# Patient Record
Sex: Male | Born: 1960 | Race: Black or African American | Hispanic: No | Marital: Single | Smoking: Former smoker
Health system: Southern US, Community
[De-identification: ages and names within clinical notes are randomized; demographics above are authoritative.]

## PROBLEM LIST (undated history)

## (undated) ENCOUNTER — Emergency Department (HOSPITAL_BASED_OUTPATIENT_CLINIC_OR_DEPARTMENT_OTHER): Admission: EM | Payer: Self-pay | Source: Home / Self Care

## (undated) DIAGNOSIS — F1910 Other psychoactive substance abuse, uncomplicated: Secondary | ICD-10-CM

## (undated) DIAGNOSIS — F329 Major depressive disorder, single episode, unspecified: Secondary | ICD-10-CM

## (undated) DIAGNOSIS — K625 Hemorrhage of anus and rectum: Secondary | ICD-10-CM

## (undated) DIAGNOSIS — K852 Alcohol induced acute pancreatitis without necrosis or infection: Secondary | ICD-10-CM

## (undated) DIAGNOSIS — E0590 Thyrotoxicosis, unspecified without thyrotoxic crisis or storm: Secondary | ICD-10-CM

## (undated) DIAGNOSIS — M109 Gout, unspecified: Secondary | ICD-10-CM

## (undated) DIAGNOSIS — M1990 Unspecified osteoarthritis, unspecified site: Secondary | ICD-10-CM

## (undated) DIAGNOSIS — R45851 Suicidal ideations: Secondary | ICD-10-CM

## (undated) DIAGNOSIS — F32A Depression, unspecified: Secondary | ICD-10-CM

## (undated) DIAGNOSIS — D500 Iron deficiency anemia secondary to blood loss (chronic): Secondary | ICD-10-CM

## (undated) HISTORY — DX: Other psychoactive substance abuse, uncomplicated: F19.10

## (undated) HISTORY — PX: OTHER SURGICAL HISTORY: SHX169

## (undated) HISTORY — PX: ORTHOPEDIC SURGERY: SHX850

## (undated) HISTORY — DX: Gout, unspecified: M10.9

---

## 2005-01-08 ENCOUNTER — Inpatient Hospital Stay (HOSPITAL_COMMUNITY): Admission: AC | Admit: 2005-01-08 | Discharge: 2005-01-10 | Payer: Self-pay

## 2008-04-17 ENCOUNTER — Ambulatory Visit: Payer: Self-pay | Admitting: Cardiology

## 2009-01-22 ENCOUNTER — Emergency Department (HOSPITAL_COMMUNITY): Admission: EM | Admit: 2009-01-22 | Discharge: 2009-01-22 | Payer: Self-pay | Admitting: Emergency Medicine

## 2009-10-06 ENCOUNTER — Ambulatory Visit: Payer: Self-pay | Admitting: Psychiatry

## 2009-10-06 ENCOUNTER — Emergency Department (HOSPITAL_COMMUNITY): Admission: EM | Admit: 2009-10-06 | Discharge: 2009-10-06 | Payer: Self-pay | Admitting: Emergency Medicine

## 2009-10-06 ENCOUNTER — Inpatient Hospital Stay (HOSPITAL_COMMUNITY): Admission: RE | Admit: 2009-10-06 | Discharge: 2009-10-12 | Payer: Self-pay | Admitting: Psychiatry

## 2009-10-07 ENCOUNTER — Ambulatory Visit: Payer: Self-pay | Admitting: Infectious Disease

## 2009-10-21 ENCOUNTER — Emergency Department (HOSPITAL_COMMUNITY): Admission: EM | Admit: 2009-10-21 | Discharge: 2009-10-21 | Payer: Self-pay | Admitting: Emergency Medicine

## 2009-11-07 ENCOUNTER — Ambulatory Visit (HOSPITAL_COMMUNITY): Admission: RE | Admit: 2009-11-07 | Discharge: 2009-11-07 | Payer: Self-pay | Admitting: Urology

## 2009-11-22 ENCOUNTER — Emergency Department (HOSPITAL_COMMUNITY): Admission: EM | Admit: 2009-11-22 | Discharge: 2009-11-22 | Payer: Self-pay | Admitting: Emergency Medicine

## 2010-05-10 ENCOUNTER — Ambulatory Visit: Payer: Self-pay | Admitting: Psychiatry

## 2010-05-10 ENCOUNTER — Inpatient Hospital Stay (HOSPITAL_COMMUNITY): Admission: AD | Admit: 2010-05-10 | Discharge: 2010-05-16 | Payer: Self-pay | Admitting: Psychiatry

## 2010-05-10 ENCOUNTER — Emergency Department (HOSPITAL_COMMUNITY): Admission: EM | Admit: 2010-05-10 | Discharge: 2010-05-10 | Payer: Self-pay | Admitting: Emergency Medicine

## 2010-07-08 ENCOUNTER — Emergency Department (HOSPITAL_COMMUNITY): Admission: EM | Admit: 2010-07-08 | Discharge: 2010-07-08 | Payer: Self-pay | Admitting: Emergency Medicine

## 2010-07-09 ENCOUNTER — Ambulatory Visit: Payer: Self-pay | Admitting: Psychiatry

## 2010-08-07 ENCOUNTER — Emergency Department (HOSPITAL_COMMUNITY): Admission: EM | Admit: 2010-08-07 | Discharge: 2010-08-07 | Payer: Self-pay | Admitting: Emergency Medicine

## 2011-03-03 LAB — STREP A DNA PROBE: Group A Strep Probe: NEGATIVE

## 2011-03-03 LAB — DIFFERENTIAL
Basophils Absolute: 0.1 10*3/uL (ref 0.0–0.1)
Basophils Relative: 1 % (ref 0–1)
Eosinophils Absolute: 0.1 10*3/uL (ref 0.0–0.7)
Monocytes Absolute: 0.4 10*3/uL (ref 0.1–1.0)
Neutro Abs: 5.2 10*3/uL (ref 1.7–7.7)
Neutrophils Relative %: 59 % (ref 43–77)

## 2011-03-03 LAB — HEPATIC FUNCTION PANEL
AST: 16 U/L (ref 0–37)
Albumin: 3.9 g/dL (ref 3.5–5.2)
Total Protein: 6.8 g/dL (ref 6.0–8.3)

## 2011-03-03 LAB — POCT I-STAT, CHEM 8
BUN: 6 mg/dL (ref 6–23)
Chloride: 111 mEq/L (ref 96–112)
Creatinine, Ser: 1 mg/dL (ref 0.4–1.5)
Glucose, Bld: 92 mg/dL (ref 70–99)
HCT: 38 % — ABNORMAL LOW (ref 39.0–52.0)
Potassium: 3.5 mEq/L (ref 3.5–5.1)

## 2011-03-03 LAB — URINALYSIS, ROUTINE W REFLEX MICROSCOPIC
Ketones, ur: NEGATIVE mg/dL
Nitrite: NEGATIVE
Protein, ur: NEGATIVE mg/dL

## 2011-03-03 LAB — RAPID URINE DRUG SCREEN, HOSP PERFORMED
Amphetamines: NOT DETECTED
Cocaine: POSITIVE — AB
Opiates: NOT DETECTED
Tetrahydrocannabinol: NOT DETECTED

## 2011-03-03 LAB — CBC
Hemoglobin: 13.1 g/dL (ref 13.0–17.0)
MCHC: 33.8 g/dL (ref 30.0–36.0)
MCV: 86.1 fL (ref 78.0–100.0)
RDW: 14.2 % (ref 11.5–15.5)

## 2011-03-19 LAB — COMPREHENSIVE METABOLIC PANEL
AST: 20 U/L (ref 0–37)
Albumin: 3.6 g/dL (ref 3.5–5.2)
Chloride: 109 mEq/L (ref 96–112)
Creatinine, Ser: 1.35 mg/dL (ref 0.4–1.5)
GFR calc Af Amer: >60 mL/min (ref >60)
Sodium: 142 mEq/L (ref 135–145)
Total Bilirubin: 0.4 mg/dL (ref 0.3–1.2)

## 2011-03-19 LAB — DIFFERENTIAL
Eosinophils Relative: 1 % (ref 0–5)
Lymphocytes Relative: 15 % (ref 12–46)
Lymphs Abs: 2.3 10*3/uL (ref 0.7–4.0)
Monocytes Absolute: 0.5 10*3/uL (ref 0.1–1.0)

## 2011-03-19 LAB — URINALYSIS, ROUTINE W REFLEX MICROSCOPIC
Bilirubin Urine: NEGATIVE
Specific Gravity, Urine: 1.025 (ref 1.005–1.030)
pH: 6 (ref 5.0–8.0)

## 2011-03-19 LAB — URINE MICROSCOPIC-ADD ON

## 2011-03-19 LAB — CBC
MCV: 87 fL (ref 78.0–100.0)
Platelets: 158 10*3/uL (ref 150–400)
WBC: 15.2 10*3/uL — ABNORMAL HIGH (ref 4.0–10.5)

## 2011-03-19 LAB — LACTIC ACID, PLASMA: Lactic Acid, Venous: 1.6 mmol/L (ref 0.5–2.2)

## 2011-03-19 LAB — RAPID URINE DRUG SCREEN, HOSP PERFORMED
Amphetamines: NOT DETECTED
Benzodiazepines: NOT DETECTED
Cocaine: NOT DETECTED
Tetrahydrocannabinol: NOT DETECTED

## 2011-03-20 LAB — COMPREHENSIVE METABOLIC PANEL
ALT: 13 U/L (ref 0–53)
AST: 26 U/L (ref 0–37)
CO2: 22 mEq/L (ref 19–32)
Calcium: 8.7 mg/dL (ref 8.4–10.5)
Chloride: 107 mEq/L (ref 96–112)
GFR calc Af Amer: >60 mL/min (ref >60)
GFR calc non Af Amer: >60 mL/min (ref >60)
Sodium: 140 mEq/L (ref 135–145)
Total Bilirubin: 1.1 mg/dL (ref 0.3–1.2)

## 2011-03-20 LAB — DIFFERENTIAL
Eosinophils Absolute: 0 10*3/uL (ref 0.0–0.7)
Eosinophils Relative: 0 % (ref 0–5)
Lymphs Abs: 1.3 10*3/uL (ref 0.7–4.0)
Monocytes Absolute: 0.6 10*3/uL (ref 0.1–1.0)

## 2011-03-20 LAB — HIV ANTIBODY (ROUTINE TESTING W REFLEX): HIV: NONREACTIVE

## 2011-03-20 LAB — RAPID URINE DRUG SCREEN, HOSP PERFORMED
Benzodiazepines: NOT DETECTED
Cocaine: POSITIVE — AB
Opiates: NOT DETECTED

## 2011-03-20 LAB — CBC
RBC: 4.19 MIL/uL — ABNORMAL LOW (ref 4.22–5.81)
WBC: 12.6 10*3/uL — ABNORMAL HIGH (ref 4.0–10.5)

## 2011-03-20 LAB — POCT CARDIAC MARKERS: Troponin i, poc: <0.05 ng/mL (ref 0.00–0.09)

## 2011-03-20 LAB — D-DIMER, QUANTITATIVE: D-Dimer, Quant: 0.7 ug/mL-FEU — ABNORMAL HIGH (ref 0.00–0.48)

## 2011-04-14 ENCOUNTER — Emergency Department (HOSPITAL_COMMUNITY)
Admission: EM | Admit: 2011-04-14 | Discharge: 2011-04-14 | Discharge status: Against Medical Advice | Payer: Medicare Other | Attending: Emergency Medicine | Admitting: Emergency Medicine

## 2011-04-14 ENCOUNTER — Emergency Department (HOSPITAL_COMMUNITY): Payer: Medicare Other

## 2011-04-14 DIAGNOSIS — X500XXA Overexertion from strenuous movement or load, initial encounter: Secondary | ICD-10-CM | POA: Insufficient documentation

## 2011-04-14 DIAGNOSIS — Y998 Other external cause status: Secondary | ICD-10-CM | POA: Insufficient documentation

## 2011-04-14 DIAGNOSIS — S93409A Sprain of unspecified ligament of unspecified ankle, initial encounter: Secondary | ICD-10-CM | POA: Insufficient documentation

## 2011-04-14 DIAGNOSIS — M129 Arthropathy, unspecified: Secondary | ICD-10-CM | POA: Insufficient documentation

## 2011-04-14 DIAGNOSIS — Y9367 Activity, basketball: Secondary | ICD-10-CM | POA: Insufficient documentation

## 2011-07-13 ENCOUNTER — Emergency Department (HOSPITAL_COMMUNITY)
Admission: EM | Admit: 2011-07-13 | Discharge: 2011-07-13 | Disposition: A | Discharge status: Home / Self Care | Payer: Medicare Other | Attending: Emergency Medicine | Admitting: Emergency Medicine

## 2011-07-13 ENCOUNTER — Encounter: Payer: Self-pay | Admitting: *Deleted

## 2011-07-13 DIAGNOSIS — R252 Cramp and spasm: Secondary | ICD-10-CM | POA: Insufficient documentation

## 2011-07-13 DIAGNOSIS — IMO0001 Reserved for inherently not codable concepts without codable children: Secondary | ICD-10-CM | POA: Insufficient documentation

## 2011-07-13 HISTORY — DX: Unspecified osteoarthritis, unspecified site: M19.90

## 2011-07-13 MED ORDER — CYCLOBENZAPRINE HCL 10 MG PO TABS: ORAL_TABLET | ORAL | Status: DC

## 2011-07-13 MED ORDER — OXYCODONE-ACETAMINOPHEN 5-325 MG PO TABS: 1.0000 | ORAL_TABLET | Freq: Four times a day (QID) | ORAL | Status: AC | PRN

## 2011-07-13 MED ORDER — OXYCODONE-ACETAMINOPHEN 5-325 MG PO TABS
1.0000 | ORAL_TABLET | Freq: Once | ORAL | Status: AC
  Administered 2011-07-13: 1 via ORAL
  Filled 2011-07-13: qty 1

## 2011-07-13 MED ORDER — CYCLOBENZAPRINE HCL 10 MG PO TABS
10.0000 mg | ORAL_TABLET | Freq: Once | ORAL | Status: AC
  Administered 2011-07-13: 10 mg via ORAL
  Filled 2011-07-13: qty 1

## 2011-07-13 NOTE — ED Provider Notes (Signed)
History     Chief Complaint  Patient presents with  . Muscle Pain   The history is provided by the patient (pt has intermittent muscle spasms in his arms.  his MD in [place] treats him with flexeril and percocet.). No language interpreter was used.    Past Medical History  Diagnosis Date  . Arthritis     Past Surgical History  Procedure Date  . Orthopedic surgery     No family history on file.  History  Substance Use Topics  . Smoking status: Never Smoker   . Smokeless tobacco: Not on file  . Alcohol Use: 0.6 oz/week    1 Cans of beer per week      Review of Systems  Musculoskeletal: Positive for myalgias.       Muscle spasms    Physical Exam  BP 129/79  Pulse 76  Resp 20  Ht 6\' 3"  (1.905 m)  Wt 165 lb (74.844 kg)  BMI 20.62 kg/m2  SpO2 100%  Physical Exam  Nursing note and vitals reviewed. Constitutional: He is oriented to person, place, and time. Vital signs are normal. He appears well-developed and well-nourished.  HENT:  Head: Normocephalic and atraumatic.  Right Ear: External ear normal.  Left Ear: External ear normal.  Nose: Nose normal.  Mouth/Throat: No oropharyngeal exudate.  Eyes: Conjunctivae and EOM are normal. Pupils are equal, round, and reactive to light. Right eye exhibits no discharge. Left eye exhibits no discharge. No scleral icterus.  Neck: Normal range of motion. Neck supple. No JVD present. No tracheal deviation present. No thyromegaly present.  Cardiovascular: Normal rate, regular rhythm, normal heart sounds, intact distal pulses and normal pulses.  Exam reveals no gallop and no friction rub.   No murmur heard. Pulmonary/Chest: Effort normal and breath sounds normal. No stridor. No respiratory distress. He has no wheezes. He has no rales. He exhibits no tenderness.  Abdominal: Soft. Normal appearance and bowel sounds are normal. He exhibits no distension and no mass. There is no tenderness. There is no rebound and no guarding.    Musculoskeletal: He exhibits no edema and no tenderness.       Right shoulder: Tenderness: `````````````````B upper extremities having muscle spasms.  Lymphadenopathy:    He has no cervical adenopathy.  Neurological: He is alert and oriented to person, place, and time. He has normal reflexes. No cranial nerve deficit. Coordination normal. GCS eye subscore is 4. GCS verbal subscore is 5. GCS motor subscore is 6.  Reflex Scores:      Tricep reflexes are 2+ on the right side and 2+ on the left side.      Bicep reflexes are 2+ on the right side and 2+ on the left side.      Brachioradialis reflexes are 2+ on the right side and 2+ on the left side.      Patellar reflexes are 2+ on the right side and 2+ on the left side.      Achilles reflexes are 2+ on the right side and 2+ on the left side. Skin: Skin is warm and dry. No rash noted. He is not diaphoretic.  Psychiatric: He has a normal mood and affect. His speech is normal and behavior is normal. Judgment and thought content normal. Cognition and memory are normal.    ED Course  Procedures  MDM Pt has just moved here to take care of his mother and has no PCP.      Worthy Rancher, PA 07/13/11 [phone]  Worthy Rancher, PA 07/13/11 2320  Worthy Rancher, PA 07/13/11 2321  Worthy Rancher, PA 07/13/11 2323 Medical screening examination/treatment/procedure(s) were performed by non-physician practitioner and as supervising physician I was immediately available for consultation/collaboration.  Nicoletta Dress. Colon Branch, MD 07/17/11 2337

## 2011-07-13 NOTE — ED Notes (Signed)
PT GIVEN WARM BLANKETS TO PLACE ON LEGS. PER PT, PT HAS HISTORY OF ARTHRITIS. STATES HE IS HAVING A FLARE UP THAT STARTED THIS MORNING. STATES HE GOT UP THIS MORNING, MOVING AROUND HELPED. PAIN STARTS IN BILATERAL HIPS AND RADIATES DOWN TO FEET. MOST SEVER PAIN IN BILATERAL KNEES. STATES IT IS SHARP AND ACHING IN NATURE. MILD SWELLING TO KNEES. MOVING AROUND A LOT MAKES PAIN WORSE. STATES HE USUALLY TAKES FLEXERIL FOR PAIN AND IT TENDS TO HELP BUT IS OUT OF MEDICATION. DENIES ANY NEEDS AT THIS TIME. CALL BELL AT BS. PENDING MD EVAL.

## 2011-07-13 NOTE — ED Notes (Signed)
Has a history of arthritis, out of pain meds. Advised of extended wait

## 2011-08-09 ENCOUNTER — Emergency Department (HOSPITAL_COMMUNITY)
Admission: EM | Admit: 2011-08-09 | Discharge: 2011-08-09 | Disposition: A | Discharge status: Home / Self Care | Payer: Medicare Other | Attending: Emergency Medicine | Admitting: Emergency Medicine

## 2011-08-09 ENCOUNTER — Encounter (HOSPITAL_COMMUNITY): Payer: Self-pay | Admitting: *Deleted

## 2011-08-09 DIAGNOSIS — M62838 Other muscle spasm: Secondary | ICD-10-CM

## 2011-08-09 MED ORDER — DIAZEPAM 5 MG PO TABS: 5.0000 mg | ORAL_TABLET | Freq: Two times a day (BID) | ORAL | Status: AC | PRN

## 2011-08-09 MED ORDER — DIAZEPAM 5 MG/ML IJ SOLN: 5.0000 mg | Freq: Once | INTRAMUSCULAR | Status: DC

## 2011-08-09 MED ORDER — HYDROMORPHONE HCL 1 MG/ML IJ SOLN
2.0000 mg | Freq: Once | INTRAMUSCULAR | Status: AC
  Administered 2011-08-09: 2 mg via INTRAMUSCULAR
  Filled 2011-08-09: qty 2

## 2011-08-09 MED ORDER — DIAZEPAM 5 MG PO TABS
5.0000 mg | ORAL_TABLET | Freq: Once | ORAL | Status: AC
  Administered 2011-08-09: 5 mg via ORAL
  Filled 2011-08-09: qty 1

## 2011-08-09 NOTE — ED Notes (Signed)
Pt says his pain is moving from his left leg to his right

## 2011-08-09 NOTE — ED Provider Notes (Signed)
History     CSN: [id number] Arrival date & time: 08/09/2011  4:53 AM  Chief Complaint  Patient presents with  . 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  He reports at times he will have severe cramps in his legs, however this episode is worse than normal.  He admits that his symptoms started over 3 weeks ago but has just worsened.  No new injury. No other new complaints  Patient is a 50 y.o. male presenting with leg pain. The history is provided by the patient.  Leg Pain  The incident occurred more than 1 week ago. There was no injury mechanism. The pain is present in the left leg. Quality: spasm. The pain is severe. The pain has been constant since onset. Pertinent negatives include no numbness, no muscle weakness, no loss of sensation and no tingling. The symptoms are aggravated by palpation and activity.    Past Medical History  Diagnosis Date  . Arthritis     Past Surgical History  Procedure Date  . Orthopedic surgery     No family history on file.  History  Substance Use Topics  . Smoking status: Never Smoker   . Smokeless tobacco: Not on file  . Alcohol Use: 0.6 oz/week    1 Cans of beer per week      Review of Systems  Neurological: Negative for tingling and numbness.  All other systems reviewed and are negative.    Physical Exam  BP 127/75  Pulse 84  Temp(Src) 97.9 F (36.6 C) (Oral)  Resp 24  Ht 6\' 2"  (1.88 m)  Wt 162 lb (73.483 kg)  BMI 20.80 kg/m2  SpO2 100%  Physical Exam  CONSTITUTIONAL: Well developed/well nourished, anxious appearing HEAD AND FACE: Normocephalic/atraumatic EYES: EOMI/PERRL ENMT: Mucous membranes moist NECK: supple no meningeal signs SPINE:entire spine nontender CV: S1/S2 noted, no murmurs/rubs/gallops noted LUNGS: Lungs are clear to auscultation bilaterally, no apparent distress ABDOMEN: soft, nontender, no rebound or guarding NEURO: Pt is awake/alert, moves  all extremitiesx4 EXTREMITIES: pulses normal, full ROM, has tendernes to palpation of left thigh and left calf, no erythema/edema noted.  Healed scars noted to his lower extremities SKIN: warm, color normal PSYCH: anxious   ED Course  Procedures  MDM Nursing notes reviewed and considered in documentation Previous records reviewed and considered  Pt with severe muscle spasms, will treat and reassess   6:42 AM pt much improved, sleeping at this time.   Pt now reports spasm in his other leg, but overall feels improved, reports similar to prior.  Reports long h/o spasms.   Requests referral to primary provider  7:50 AM    Joya Gaskins, MD 08/09/11 [phone]

## 2011-08-09 NOTE — ED Notes (Signed)
Resting in no distress; awakened to assess pain; Pt c/o of LLE pain throughout the night; states he rec'd a shot for his pain, but that his pain is not relieved and has not moved to RLE; states pain comes from ankles bilaterally and radiates up to knees; describes as cramping.

## 2011-08-09 NOTE — ED Notes (Signed)
Pt reports severe leg cramps worsening this am

## 2011-08-09 NOTE — ED Notes (Signed)
Assumed c/o pt

## 2011-08-09 NOTE — ED Notes (Signed)
Pt up OOB, reports pain improved-ambulatory with steady gait; placed in wheelchair and escorted to waiting room by pt advocate. Left in c/o spouse for transport home.

## 2011-08-25 ENCOUNTER — Encounter (HOSPITAL_COMMUNITY): Payer: Self-pay | Admitting: *Deleted

## 2011-08-25 ENCOUNTER — Emergency Department (HOSPITAL_COMMUNITY)
Admission: EM | Admit: 2011-08-25 | Discharge: 2011-08-25 | Disposition: A | Discharge status: Home / Self Care | Payer: Medicare Other | Attending: Emergency Medicine | Admitting: Emergency Medicine

## 2011-08-25 ENCOUNTER — Emergency Department (HOSPITAL_COMMUNITY): Payer: Medicare Other

## 2011-08-25 DIAGNOSIS — M1120 Other chondrocalcinosis, unspecified site: Secondary | ICD-10-CM

## 2011-08-25 DIAGNOSIS — M25469 Effusion, unspecified knee: Secondary | ICD-10-CM | POA: Insufficient documentation

## 2011-08-25 DIAGNOSIS — M25569 Pain in unspecified knee: Secondary | ICD-10-CM | POA: Insufficient documentation

## 2011-08-25 DIAGNOSIS — M25562 Pain in left knee: Secondary | ICD-10-CM

## 2011-08-25 LAB — DIFFERENTIAL
Basophils Relative: 0 % (ref 0–1)
Lymphocytes Relative: 12 % (ref 12–46)
Lymphs Abs: 1.6 10*3/uL (ref 0.7–4.0)
Monocytes Relative: 5 % (ref 3–12)
Neutro Abs: 11.1 10*3/uL — ABNORMAL HIGH (ref 1.7–7.7)
Neutrophils Relative %: 83 % — ABNORMAL HIGH (ref 43–77)

## 2011-08-25 LAB — CBC
Hemoglobin: 13.1 g/dL (ref 13.0–17.0)
RBC: 4.68 MIL/uL (ref 4.22–5.81)
WBC: 13.4 10*3/uL — ABNORMAL HIGH (ref 4.0–10.5)

## 2011-08-25 LAB — URIC ACID: Uric Acid, Serum: 1.8 mg/dL — ABNORMAL LOW (ref 4.0–7.8)

## 2011-08-25 MED ORDER — OXYCODONE-ACETAMINOPHEN 5-325 MG PO TABS
1.0000 | ORAL_TABLET | Freq: Once | ORAL | Status: AC
  Administered 2011-08-25: 1 via ORAL
  Filled 2011-08-25: qty 1

## 2011-08-25 MED ORDER — OXYCODONE-ACETAMINOPHEN 5-325 MG PO TABS: 1.0000 | ORAL_TABLET | ORAL | Status: AC | PRN

## 2011-08-25 MED ORDER — INDOMETHACIN 25 MG PO CAPS: 50.0000 mg | ORAL_CAPSULE | Freq: Three times a day (TID) | ORAL | Status: DC | PRN

## 2011-08-25 NOTE — ED Notes (Signed)
C/o arthritis pain and swelling to both knees that started last night.  States took flexaril yesterday evening without relief.  Denies injury.

## 2011-08-25 NOTE — ED Notes (Signed)
PA in with pt at this time  

## 2011-08-25 NOTE — ED Provider Notes (Signed)
History     CSN: [id number] Arrival date & time: 08/25/2011 10:55 AM  Chief Complaint  Patient presents with  . Knee Pain   HPI Comments: Patient c/o waxing and waning pain to both knees for several weeks.  States that he was seen here recently for same sx's and prescribed muscle relaxers which he states have not helped control the pain.  He also stats the pain was more severe in the left knee last knee and this week he is now having similar pain in the right knee.  He denies fever, redness, rash or numbness.    Patient is a 50 y.o. male presenting with knee pain. The history is provided by the patient.  Knee Pain This is a recurrent problem. The current episode started yesterday. The problem occurs constantly. The problem has been unchanged. Associated symptoms include arthralgias and joint swelling. Pertinent negatives include no abdominal pain, chest pain, chills, fatigue, fever, headaches, myalgias, nausea, neck pain, numbness, rash, swollen glands, vomiting or weakness. The symptoms are aggravated by standing and walking. Treatments tried: OTC medication and muscle relaxers. The treatment provided no relief.  Knee Pain This is a recurrent problem. The current episode started yesterday. The problem occurs constantly. The problem has been unchanged. Pertinent negatives include no chest pain, no abdominal pain and no headaches. The symptoms are aggravated by standing and walking. Treatments tried: OTC medication and muscle relaxers. The treatment provided no relief.    Past Medical History  Diagnosis Date  . Arthritis     Past Surgical History  Procedure Date  . Orthopedic surgery     No family history on file.  History  Substance Use Topics  . Smoking status: Never Smoker   . Smokeless tobacco: Not on file  . Alcohol Use: 0.6 oz/week    1 Cans of beer per week      Review of Systems  Constitutional: Negative for fever, chills and fatigue.  HENT: Negative for neck pain.     Cardiovascular: Negative for chest pain.  Gastrointestinal: Negative for nausea, vomiting and abdominal pain.  Genitourinary: Negative for hematuria, flank pain and difficulty urinating.  Musculoskeletal: Positive for joint swelling and arthralgias. Negative for myalgias.  Skin: Negative for rash.  Neurological: Negative for weakness, numbness and headaches.  Hematological: Negative for adenopathy. Does not bruise/bleed easily.  All other systems reviewed and are negative.    Physical Exam  BP 124/80  Pulse 97  Temp(Src) 97.8 F (36.6 C) (Oral)  Resp 18  Ht 6\' 2"  (1.88 m)  Wt 162 lb (73.483 kg)  BMI 20.80 kg/m2  SpO2 100%  Physical Exam  Nursing note and vitals reviewed. Constitutional: He is oriented to person, place, and time. He appears well-developed and well-nourished. No distress.  HENT:  Head: Normocephalic and atraumatic.  Mouth/Throat: Oropharynx is clear and moist.  Neck: Normal range of motion. Neck supple. No thyromegaly present.  Cardiovascular: Normal rate, regular rhythm and normal heart sounds.   Pulmonary/Chest: Effort normal and breath sounds normal.  Musculoskeletal: He exhibits edema and tenderness.       Right knee: He exhibits decreased range of motion and swelling. He exhibits no ecchymosis, no deformity, no laceration, no erythema, normal alignment, normal patellar mobility and no MCL laxity. tenderness found.       Left knee: He exhibits swelling. He exhibits normal range of motion, no effusion, no ecchymosis, no deformity, no laceration, no erythema and normal patellar mobility. tenderness found. No patellar tendon tenderness noted.  Lymphadenopathy:    He has no cervical adenopathy.  Neurological: He is oriented to person, place, and time. No cranial nerve deficit. He exhibits normal muscle tone. Coordination normal.  Skin: Skin is warm and dry. No rash noted. No erythema.    ED Course  Procedures  MDM    Dg Knee 2 Views Left  08/25/2011   *RADIOLOGY REPORT*  Clinical Data: Pain and swelling left knee  LEFT KNEE - 1-2 VIEW  Comparison: None  Findings: Osseous mineralization normal. Joint spaces preserved. Chondrocalcinosis question CPPD/pseudogout. Small knee joint effusion is questioned. No acute fracture, dislocation, or bone destruction.  IMPRESSION: Chondrocalcinosis question CPPD/pseudogout. Probable small knee joint effusion. No additional bony findings.  Original Report Authenticated By: Lollie Marrow, M.D.   Dg Knee 2 Views Right  08/25/2011  *RADIOLOGY REPORT*  Clinical Data: Pain and swelling right knee  RIGHT KNEE - 1-2 VIEW  Comparison: None  Findings: Osseous mineralization normal. Joint spaces preserved. Mild chondrocalcinosis question CPPD/pseudogout. Small knee joint effusion present. Mildly prominent tibial tubercle. No acute fracture, dislocation or bone destruction.  IMPRESSION: Chondrocalcinosis question CPPD/pseudogout. Small knee joint effusion. No acute osseous abnormalities otherwise seen.  Original Report Authenticated By: Lollie Marrow, M.   2:58 PM Pt feels improved after observation and/or treatment in ED.   Ambulated to the restroom with slight limp.  Pain to bilateral knees likely related to pseudogout.    Medical screening examination/treatment/procedure(s) were performed by non-physician practitioner and as supervising physician I was immediately available for consultation/collaboration. Osvaldo Human, M.D.   The patient appears reasonably screened and/or stabilized for discharge and I doubt any other medical condition or other [facility] requiring further screening, evaluation, or treatment in the ED at this time prior to discharge.   Tammy L. [place], [place] 08/26/11 1721  Carleene Cooper III, MD 08/27/11 [phone]

## 2011-08-27 ENCOUNTER — Encounter: Payer: Self-pay | Admitting: Family Medicine

## 2011-08-27 ENCOUNTER — Ambulatory Visit (INDEPENDENT_AMBULATORY_CARE_PROVIDER_SITE_OTHER): Payer: Medicare Other | Admitting: Family Medicine

## 2011-08-27 VITALS — BP 110/74 | HR 76 | Resp 16 | Ht 73.75 in | Wt 167.1 lb

## 2011-08-27 DIAGNOSIS — G8929 Other chronic pain: Secondary | ICD-10-CM | POA: Insufficient documentation

## 2011-08-27 DIAGNOSIS — M11869 Other specified crystal arthropathies, unspecified knee: Secondary | ICD-10-CM

## 2011-08-27 DIAGNOSIS — M11269 Other chondrocalcinosis, unspecified knee: Secondary | ICD-10-CM

## 2011-08-27 DIAGNOSIS — F1910 Other psychoactive substance abuse, uncomplicated: Secondary | ICD-10-CM

## 2011-08-27 DIAGNOSIS — Z23 Encounter for immunization: Secondary | ICD-10-CM

## 2011-08-27 MED ORDER — INFLUENZA VAC TYPES A & B PF IM SUSP: 0.5000 mL | Freq: Once | INTRAMUSCULAR | Status: DC

## 2011-08-27 NOTE — Patient Instructions (Addendum)
Continue your current medications  I will send a referral for orthopedic surgeon for your knee  F/U in 4 weeks for a physical exam

## 2011-08-27 NOTE — Assessment & Plan Note (Signed)
As his NSAIDs are working we'll not start noticing at this time. He has an effusion in his right knee will refer to orthopedic surgery to have this drained and possible send fluid for analysis for crystallization.

## 2011-08-27 NOTE — Assessment & Plan Note (Signed)
Patient continues to use cocaine. I have discussed with him that I'm not comfortable prescribing pain medications with his continued use of cocaine. He does not want any help at this time trying to quit he wants to do it on his own.

## 2011-08-27 NOTE — Assessment & Plan Note (Signed)
Patient currently has anti-inflammatories as well as Percocet given from the ED for his knee pain. There likely will be ongoing ongoing discussions regarding his pain. He will need to be sent to a pain clinic if so secondary to his substance abuse.

## 2011-08-27 NOTE — Progress Notes (Signed)
  Subjective:    Patient ID: Phillip Kemp, male    DOB: 08/16/1961, 50 y.o.   MRN: [id number]  HPI Previous PCP - Dr. Shirline Frees in [place] , [place]  Last seen  > 5 years ago, he does not have a recent PCP and does not see a specialist. Medications and history were reviewed. [facility] chart was reviewed  1. 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 Typically uses when he receives his disability check. 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  No history of HTN, Lung diease, DM, Cancer   Review of Systems  GEN- denies fatigue, fever, weight loss,weakness, recent illness CVS- denies chest pain, palpitations RESP- denies SOB, cough, wheeze ABD- denies N/V, change in stools, abd pain MSK- + joint pain,+ muscle aches,remote  Injury, + swelling  knees Neuro- denies headache, dizziness, syncope, seizure activity      Objective:   Physical Exam GEN- NAD, alert and oriented x3 HEENT- PERRL, EOMI, , MMM, oropharynx clear, poor  dentition, multiple cavities Neck- Supple, no thryomegaly CVS- RRR, no murmur RESP-CTAB ABD- NABS, soft, NT, ND EXT- No edema, left foot 1st-2nd digits removed, well healed scar, skin graft on right calf and right thigh MSK- Right Knee- +swelling/effusion noted mostly on medial aspect of knee, mild warmth, no apparent deformity besides swelling, TTP along medial and lateral aspects of knee,minimal crepitus, pain with flexion of knee and extension, ligaments appear in tact.          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

## 2011-08-28 ENCOUNTER — Ambulatory Visit: Payer: Medicare Other | Admitting: Orthopedic Surgery

## 2011-08-29 ENCOUNTER — Telehealth: Payer: Self-pay | Admitting: Family Medicine

## 2011-08-29 NOTE — Telephone Encounter (Signed)
I spoke with pt on the phone today, he was given Dr. Mort Sawyers number to have his knee aspirated. States he was in school and phone was off.  They have an appt time for him. He will call our office back if he has trouble

## 2011-08-29 NOTE — Telephone Encounter (Signed)
Message copied by Salley Scarlet on Fri Aug 29, 2011 11:36 AM ------      Message from: Cammie Sickle A      Created: Fri Aug 29, 2011  9:54 AM      Regarding: ortho referral      Contact: [phone]       RE:  Ortho referral for right knee aspiration       # 08/27/11 and 08/28/11 - attempted to reach patient at both cell # [phone] (voice messages full) and home ph# [phone], apparently non-working # (automated message "please enter access code.)

## 2011-09-02 ENCOUNTER — Ambulatory Visit: Payer: Medicare Other | Admitting: Orthopedic Surgery

## 2011-09-03 ENCOUNTER — Telehealth: Payer: Self-pay | Admitting: Family Medicine

## 2011-09-03 NOTE — Telephone Encounter (Signed)
PATIENT STATES HE IS OUT OF INDOMETHACIN AND OXYCODONE AND HE IS IN PAIN, STATES SWELLING GOING UP TO KNEE. NEEDS REFILLS

## 2011-09-03 NOTE — Telephone Encounter (Signed)
CALLED PATIENT, NO ANSWER °

## 2011-09-04 MED ORDER — INDOMETHACIN 25 MG PO CAPS: 50.0000 mg | ORAL_CAPSULE | Freq: Three times a day (TID) | ORAL | Status: AC | PRN

## 2011-09-04 NOTE — Telephone Encounter (Signed)
Patient aware.

## 2011-09-04 NOTE — Telephone Encounter (Signed)
As I discussed with him during the visit, with his substance abuse- Cocaine- I will not give him any pain meds. The meds he had were from the ER. I will fill his indomethacin. He was suppose to go to Ortho to have his knee drained by Dr. Romeo Apple, did he return their calls? Or did he miss the appt. He can always schedule an office visit to have the swelling looked at.

## 2011-09-04 NOTE — Telephone Encounter (Signed)
Called patient, no option to leave message

## 2011-09-11 ENCOUNTER — Encounter: Payer: Self-pay | Admitting: Orthopedic Surgery

## 2011-09-11 ENCOUNTER — Ambulatory Visit: Payer: Medicare Other | Admitting: Orthopedic Surgery

## 2011-09-24 ENCOUNTER — Encounter: Payer: Self-pay | Admitting: Family Medicine

## 2011-09-24 ENCOUNTER — Encounter: Payer: Medicare Other | Admitting: Family Medicine

## 2011-09-30 ENCOUNTER — Telehealth: Payer: Self-pay | Admitting: Orthopedic Surgery

## 2011-09-30 ENCOUNTER — Ambulatory Visit: Payer: Medicare Other | Admitting: Orthopedic Surgery

## 2011-09-30 NOTE — Telephone Encounter (Signed)
Patient was a no-show for appointment (3rd time), 09/30/11 for orthopedic referral.  Thank you for your referral.  Please advise if we may be of further assistance.

## 2011-09-30 NOTE — Telephone Encounter (Signed)
Noted  

## 2011-10-20 ENCOUNTER — Emergency Department (HOSPITAL_COMMUNITY): Payer: Medicare Other

## 2011-10-20 ENCOUNTER — Encounter (HOSPITAL_COMMUNITY): Payer: Self-pay | Admitting: Emergency Medicine

## 2011-10-20 ENCOUNTER — Emergency Department (HOSPITAL_COMMUNITY)
Admission: EM | Admit: 2011-10-20 | Discharge: 2011-10-20 | Disposition: A | Discharge status: Home / Self Care | Payer: Medicare Other | Attending: Emergency Medicine | Admitting: Emergency Medicine

## 2011-10-20 ENCOUNTER — Other Ambulatory Visit: Payer: Self-pay

## 2011-10-20 DIAGNOSIS — R0602 Shortness of breath: Secondary | ICD-10-CM | POA: Insufficient documentation

## 2011-10-20 DIAGNOSIS — R05 Cough: Secondary | ICD-10-CM | POA: Insufficient documentation

## 2011-10-20 DIAGNOSIS — F1910 Other psychoactive substance abuse, uncomplicated: Secondary | ICD-10-CM | POA: Insufficient documentation

## 2011-10-20 DIAGNOSIS — M129 Arthropathy, unspecified: Secondary | ICD-10-CM | POA: Insufficient documentation

## 2011-10-20 DIAGNOSIS — F17200 Nicotine dependence, unspecified, uncomplicated: Secondary | ICD-10-CM | POA: Insufficient documentation

## 2011-10-20 DIAGNOSIS — R059 Cough, unspecified: Secondary | ICD-10-CM | POA: Insufficient documentation

## 2011-10-20 DIAGNOSIS — J069 Acute upper respiratory infection, unspecified: Secondary | ICD-10-CM | POA: Insufficient documentation

## 2011-10-20 LAB — CBC
HCT: 39.9 % (ref 39.0–52.0)
Hemoglobin: 13.7 g/dL (ref 13.0–17.0)
MCH: 28.4 pg (ref 26.0–34.0)
MCV: 82.8 fL (ref 78.0–100.0)
RBC: 4.82 MIL/uL (ref 4.22–5.81)

## 2011-10-20 LAB — DIFFERENTIAL
Eosinophils Absolute: 0.2 10*3/uL (ref 0.0–0.7)
Lymphs Abs: 3.1 10*3/uL (ref 0.7–4.0)
Monocytes Relative: 8 % (ref 3–12)
Neutrophils Relative %: 60 % (ref 43–77)

## 2011-10-20 LAB — BASIC METABOLIC PANEL
BUN: 13 mg/dL (ref 6–23)
GFR calc non Af Amer: 78 mL/min — ABNORMAL LOW (ref >90)
Glucose, Bld: 76 mg/dL (ref 70–99)
Potassium: 3.9 mEq/L (ref 3.5–5.1)

## 2011-10-20 MED ORDER — OXYCODONE-ACETAMINOPHEN 5-325 MG PO TABS: 1.0000 | ORAL_TABLET | ORAL | Status: AC | PRN

## 2011-10-20 MED ORDER — SODIUM CHLORIDE 0.9 % IV SOLN
Freq: Once | INTRAVENOUS | Status: AC
  Administered 2011-10-20: 14:00:00 via INTRAVENOUS

## 2011-10-20 MED ORDER — IBUPROFEN 600 MG PO TABS: 600.0000 mg | ORAL_TABLET | Freq: Four times a day (QID) | ORAL | Status: AC | PRN

## 2011-10-20 NOTE — ED Notes (Signed)
Patient was given a mask while in the hallway.

## 2011-10-20 NOTE — ED Provider Notes (Addendum)
History  Scribed for Phillip Gaskins, MD, the patient was seen in room [facility]4. This chart was scribed by Hillery Hunter.   CSN: [id number] Arrival date & time: 10/20/2011  1:27 PM   First MD Initiated Contact with Patient 10/20/11 1504      Chief Complaint  Patient presents with  . Cough    chest pain with cough    Patient is a 50 y.o. male presenting with cough. The history is provided by the patient.  Cough This is a new problem. The current episode started more than 1 week ago. The problem occurs constantly. The problem has not changed since onset.The cough is non-productive. Associated symptoms include headaches and rhinorrhea. He is a smoker.    Phillip Kemp is a 50 y.o. male who presents to the Emergency Department complaining of cough for one week with associated chest, head and right lateral rib pain with some rhinorrhea, mild vomiting and diarrhea. He denies significant history of medical problems and confirms that he smokes cigarettes. Denies hemoptysis  Time seen: 15:20  Past Medical History  Diagnosis Date  . Arthritis   . Substance abuse     Cocaine and alcohol    Past Surgical History  Procedure Date  . Orthopedic surgery     Left foot surgery    Family History  Problem Relation Age of Onset  . Heart disease Mother   . Cancer Father     prostate    History  Substance Use Topics  . Smoking status: Current Everyday Smoker -- 0.2 packs/day  . Smokeless tobacco: Not on file  . Alcohol Use: 0.6 oz/week    1 Cans of beer per week      Review of Systems  HENT: Positive for rhinorrhea.   Respiratory: Positive for cough.   Gastrointestinal: Positive for vomiting and diarrhea.  Neurological: Positive for headaches.    Allergies  Review of patient's allergies indicates no known allergies.  Home Medications   Current Outpatient Rx  Name Route Sig Dispense Refill  . ACETAMINOPHEN ER 650 MG PO TBCR Oral Take 650 mg by mouth every 8  (eight) hours as needed. Arthritis Pain     . CYCLOBENZAPRINE HCL 10 MG PO TABS  Take 1/2 to one tap po TID prn muscle spasms 20 tablet 0  . INDOMETHACIN 25 MG PO CAPS Oral Take 2 capsules (50 mg total) by mouth 3 (three) times daily as needed. Take with food 40 capsule 0  . MULTIVITAMIN PO Oral Take 1 tablet by mouth daily.        Initial vitals: BP 124/86  Pulse 77  Temp(Src) 98 F (36.7 C) (Oral)  Resp 20  SpO2 100%  Physical Exam  CONSTITUTIONAL: Well developed/well nourished HEAD AND FACE: Normocephalic/atraumatic EYES: EOMI/PERRL ENMT: Mucous membranes moist NECK: supple no meningeal signs SPINE:entire spine nontender CV: S1/S2 noted, no murmurs/rubs/gallops noted LUNGS: Lungs are clear to auscultation bilaterally, no apparent distress Chest - tender to palpation, no crepitance ABDOMEN: soft, nontender, no rebound or guarding GU:no cva tenderness NEURO: Pt is awake/alert, moves all extremitiesx4 EXTREMITIES: pulses normal, full ROM SKIN: warm, color normal PSYCH: no abnormalities of mood noted   ED Course  Procedures   Labs Reviewed  BASIC METABOLIC PANEL - Abnormal; Notable for the following:    GFR calc non Af Amer 78 (*)    All other components within normal limits  CBC  DIFFERENTIAL   Dg Chest 2 View  10/20/2011  *RADIOLOGY REPORT*  Clinical  Data: Cough, shortness of breath  CHEST - 2 VIEW  Comparison: 10/06/2009  Findings: Normal heart size, mediastinal contours, and pulmonary vascularity. Lungs clear. No pleural effusion or pneumothorax. Bones unremarkable.  IMPRESSION: No acute abnormalities.  Original Report Authenticated By: Lollie Marrow, M.D.        MDM  Nursing notes reviewed and considered in documentation xrays reviewed and considered - unremarkable All labs/vitals reviewed and considered    I personally performed the services described in this documentation, which was scribed in my presence. The recorded information has been reviewed and  considered.    Phillip Gaskins, MD 10/20/11 1553  Phillip Gaskins, MD 10/20/11 [phone]

## 2011-10-20 NOTE — ED Notes (Signed)
Pt c/o prod cough x 3 days. Pt hurting to chest and sides from coughing. Pt coughs so much he vomits. Sx's x 2 days now. Denies blood in vomitus-states its just mucus. Nad. Diarrhea all weekend-states diarrhea is dark brown. Dizziness intermittant all weekend-dizzy at present-stable. Alert/oriented.

## 2011-10-20 NOTE — ED Notes (Signed)
Taken to [employer]. Nad.

## 2012-05-17 ENCOUNTER — Encounter: Payer: Self-pay | Admitting: Family Medicine

## 2012-05-17 ENCOUNTER — Ambulatory Visit: Payer: Medicare Other | Admitting: Family Medicine

## 2012-07-11 ENCOUNTER — Emergency Department (HOSPITAL_COMMUNITY)
Admission: EM | Admit: 2012-07-11 | Discharge: 2012-07-11 | Disposition: A | Discharge status: Home / Self Care | Payer: Medicare Other | Attending: Emergency Medicine | Admitting: Emergency Medicine

## 2012-07-11 ENCOUNTER — Encounter (HOSPITAL_COMMUNITY): Payer: Self-pay

## 2012-07-11 DIAGNOSIS — M129 Arthropathy, unspecified: Secondary | ICD-10-CM | POA: Insufficient documentation

## 2012-07-11 DIAGNOSIS — T169XXA Foreign body in ear, unspecified ear, initial encounter: Secondary | ICD-10-CM | POA: Insufficient documentation

## 2012-07-11 DIAGNOSIS — S00419A Abrasion of unspecified ear, initial encounter: Secondary | ICD-10-CM

## 2012-07-11 DIAGNOSIS — Z87891 Personal history of nicotine dependence: Secondary | ICD-10-CM | POA: Insufficient documentation

## 2012-07-11 DIAGNOSIS — T161XXA Foreign body in right ear, initial encounter: Secondary | ICD-10-CM

## 2012-07-11 DIAGNOSIS — IMO0002 Reserved for concepts with insufficient information to code with codable children: Secondary | ICD-10-CM | POA: Insufficient documentation

## 2012-07-11 MED ORDER — AMOXICILLIN 500 MG PO CAPS: 500.0000 mg | ORAL_CAPSULE | Freq: Three times a day (TID) | ORAL | Status: AC

## 2012-07-11 MED ORDER — ANTIPYRINE-BENZOCAINE 5.4-1.4 % OT SOLN
3.0000 [drp] | Freq: Once | OTIC | Status: AC
  Administered 2012-07-11: 3 [drp] via OTIC
  Filled 2012-07-11: qty 10

## 2012-07-11 MED ORDER — IBUPROFEN 800 MG PO TABS
800.0000 mg | ORAL_TABLET | Freq: Once | ORAL | Status: AC
  Administered 2012-07-11: 800 mg via ORAL
  Filled 2012-07-11: qty 1

## 2012-07-11 NOTE — ED Provider Notes (Signed)
History     CSN: [id number]  Arrival date & time 07/11/12  1001   First MD Initiated Contact with Patient 07/11/12 1057      Chief Complaint  Patient presents with  . Otalgia    (Consider location/radiation/quality/duration/timing/severity/associated sxs/prior treatment) HPI Comments: Patient c/o foreign body to his right ear canal.  States that his child stuck a ink pen cap in his ear and he was unable to remove himself. He denies fever, hearing loss, drainage or bleeding  Patient is a 51 y.o. male presenting with ear pain. The history is provided by the patient.  Otalgia This is a new problem. Episode onset: one day PTA. There is pain in the right ear. The problem has been resolved. There has been no fever. The patient is experiencing no pain. Pertinent negatives include no ear discharge, no headaches, no hearing loss, no sore throat and no neck pain.    Past Medical History  Diagnosis Date  . Arthritis   . Substance abuse     Cocaine and alcohol    Past Surgical History  Procedure Date  . Orthopedic surgery     Left foot surgery    Family History  Problem Relation Age of Onset  . Heart disease Mother   . Cancer Father     prostate    History  Substance Use Topics  . Smoking status: Former Smoker -- 0.2 packs/day  . Smokeless tobacco: Not on file  . Alcohol Use: 0.6 oz/week    1 Cans of beer per week     occ      Review of Systems  Constitutional: Negative for fever and chills.  HENT: Positive for ear pain. Negative for hearing loss, sore throat, facial swelling, neck pain, neck stiffness and ear discharge.   Neurological: Negative for dizziness and headaches.  All other systems reviewed and are negative.    Allergies  Review of patient's allergies indicates no known allergies.  Home Medications   Current Outpatient Rx  Name Route Sig Dispense Refill  . ACETAMINOPHEN ER 650 MG PO TBCR Oral Take 650 mg by mouth every 8 (eight) hours as needed.  Arthritis Pain     . CYCLOBENZAPRINE HCL 10 MG PO TABS  Take 1/2 to one tap po TID prn muscle spasms 20 tablet 0  . MULTIVITAMIN PO Oral Take 1 tablet by mouth daily.        BP 119/70  Pulse 94  Temp 98.2 F (36.8 C)  Resp 18  Ht 6\' 1"  (1.854 m)  Wt 157 lb (71.215 kg)  BMI 20.71 kg/m2  SpO2 99%  Physical Exam  Nursing note and vitals reviewed. Constitutional: He appears well-developed and well-nourished. No distress.  HENT:  Head: Normocephalic and atraumatic.  Right Ear: Tympanic membrane and ear canal normal.  Left Ear: Tympanic membrane and ear canal normal.       fb to right ear canal was removed by the nursing staff prior to my exam.  Ear canal appears nml.  TM intact.  No perf or erythema  Eyes: Pupils are equal, round, and reactive to light.  Neck: Normal range of motion. Neck supple.  Cardiovascular: Normal rate, regular rhythm, normal heart sounds and intact distal pulses.   No murmur heard. Pulmonary/Chest: Effort normal and breath sounds normal.  Neurological: He is alert. Coordination normal.  Skin: Skin is warm and dry.    ED Course  Procedures (including critical care time)  Labs Reviewed - No data to display  No results found.      MDM     Plastic cap from an ink pen was removed by the nursing staff w/o difficulty.  Pain resolved.  TM is intact, no bleeding or edema.  Small abrasion to the right ear canal.     The patient appears reasonably screened and/or stabilized for discharge and I doubt any other medical condition or other [facility] requiring further screening, evaluation, or treatment in the ED at this time prior to discharge.    Takira Sherrin L. [place], [place] 07/16/12 2205

## 2012-07-11 NOTE — ED Notes (Signed)
Removed end of pen from r ear.

## 2012-07-11 NOTE — ED Notes (Signed)
Pt reports his son put a pen in his ear last night and pt thinks the cap of the pen is stuck in his ear.  C/O pain, denies seeing any drainage.

## 2012-07-21 NOTE — ED Provider Notes (Signed)
Medical screening examination/treatment/procedure(s) were performed by non-physician practitioner and as supervising physician I was immediately available for consultation/collaboration.  Donnetta Hutching, MD 07/21/12 1500

## 2012-09-08 ENCOUNTER — Emergency Department (HOSPITAL_COMMUNITY): Payer: Medicare Other

## 2012-09-08 ENCOUNTER — Encounter (HOSPITAL_COMMUNITY): Payer: Self-pay | Admitting: *Deleted

## 2012-09-08 ENCOUNTER — Observation Stay (HOSPITAL_COMMUNITY)
Admission: EM | Admit: 2012-09-08 | Discharge: 2012-09-11 | DRG: 439 | Disposition: A | Discharge status: Other Acute Inpatient Hospital | Payer: Medicare Other | Attending: Internal Medicine | Admitting: Internal Medicine

## 2012-09-08 DIAGNOSIS — E279 Disorder of adrenal gland, unspecified: Secondary | ICD-10-CM | POA: Diagnosis present

## 2012-09-08 DIAGNOSIS — F1020 Alcohol dependence, uncomplicated: Secondary | ICD-10-CM | POA: Diagnosis present

## 2012-09-08 DIAGNOSIS — F1410 Cocaine abuse, uncomplicated: Secondary | ICD-10-CM | POA: Diagnosis present

## 2012-09-08 DIAGNOSIS — M129 Arthropathy, unspecified: Secondary | ICD-10-CM | POA: Diagnosis present

## 2012-09-08 DIAGNOSIS — Z23 Encounter for immunization: Secondary | ICD-10-CM | POA: Insufficient documentation

## 2012-09-08 DIAGNOSIS — L988 Other specified disorders of the skin and subcutaneous tissue: Secondary | ICD-10-CM | POA: Diagnosis not present

## 2012-09-08 DIAGNOSIS — F17200 Nicotine dependence, unspecified, uncomplicated: Secondary | ICD-10-CM | POA: Diagnosis present

## 2012-09-08 DIAGNOSIS — K625 Hemorrhage of anus and rectum: Secondary | ICD-10-CM | POA: Diagnosis present

## 2012-09-08 DIAGNOSIS — D649 Anemia, unspecified: Secondary | ICD-10-CM | POA: Diagnosis present

## 2012-09-08 DIAGNOSIS — M11269 Other chondrocalcinosis, unspecified knee: Secondary | ICD-10-CM

## 2012-09-08 DIAGNOSIS — K859 Acute pancreatitis without necrosis or infection, unspecified: Principal | ICD-10-CM | POA: Diagnosis present

## 2012-09-08 DIAGNOSIS — F3289 Other specified depressive episodes: Secondary | ICD-10-CM | POA: Diagnosis present

## 2012-09-08 DIAGNOSIS — F32A Depression, unspecified: Secondary | ICD-10-CM | POA: Diagnosis present

## 2012-09-08 DIAGNOSIS — R45851 Suicidal ideations: Secondary | ICD-10-CM

## 2012-09-08 DIAGNOSIS — E278 Other specified disorders of adrenal gland: Secondary | ICD-10-CM | POA: Diagnosis present

## 2012-09-08 DIAGNOSIS — F1210 Cannabis abuse, uncomplicated: Secondary | ICD-10-CM | POA: Diagnosis present

## 2012-09-08 DIAGNOSIS — F329 Major depressive disorder, single episode, unspecified: Secondary | ICD-10-CM | POA: Diagnosis present

## 2012-09-08 DIAGNOSIS — F1910 Other psychoactive substance abuse, uncomplicated: Secondary | ICD-10-CM

## 2012-09-08 DIAGNOSIS — D500 Iron deficiency anemia secondary to blood loss (chronic): Secondary | ICD-10-CM | POA: Diagnosis present

## 2012-09-08 DIAGNOSIS — G8929 Other chronic pain: Secondary | ICD-10-CM

## 2012-09-08 DIAGNOSIS — K852 Alcohol induced acute pancreatitis without necrosis or infection: Secondary | ICD-10-CM | POA: Diagnosis present

## 2012-09-08 DIAGNOSIS — Z79899 Other long term (current) drug therapy: Secondary | ICD-10-CM

## 2012-09-08 DIAGNOSIS — Z800 Family history of malignant neoplasm of digestive organs: Secondary | ICD-10-CM

## 2012-09-08 DIAGNOSIS — R238 Other skin changes: Secondary | ICD-10-CM | POA: Diagnosis not present

## 2012-09-08 HISTORY — DX: Suicidal ideations: R45.851

## 2012-09-08 HISTORY — DX: Hemorrhage of anus and rectum: K62.5

## 2012-09-08 HISTORY — DX: Alcohol induced acute pancreatitis without necrosis or infection: K85.20

## 2012-09-08 HISTORY — DX: Iron deficiency anemia secondary to blood loss (chronic): D50.0

## 2012-09-08 LAB — COMPREHENSIVE METABOLIC PANEL
ALT: 49 U/L (ref 0–53)
AST: 35 U/L (ref 0–37)
Albumin: 3 g/dL — ABNORMAL LOW (ref 3.5–5.2)
CO2: 23 mEq/L (ref 19–32)
Calcium: 9.4 mg/dL (ref 8.4–10.5)
Sodium: 138 mEq/L (ref 135–145)
Total Protein: 6.3 g/dL (ref 6.0–8.3)

## 2012-09-08 LAB — RAPID URINE DRUG SCREEN, HOSP PERFORMED
Amphetamines: NOT DETECTED
Barbiturates: NOT DETECTED
Benzodiazepines: NOT DETECTED

## 2012-09-08 LAB — CBC WITH DIFFERENTIAL/PLATELET
Eosinophils Absolute: 0.1 10*3/uL (ref 0.0–0.7)
Eosinophils Relative: 1 % (ref 0–5)
Hemoglobin: 11.1 g/dL — ABNORMAL LOW (ref 13.0–17.0)
Lymphs Abs: 2 10*3/uL (ref 0.7–4.0)
MCH: 26.4 pg (ref 26.0–34.0)
MCHC: 33.8 g/dL (ref 30.0–36.0)
MCV: 77.9 fL — ABNORMAL LOW (ref 78.0–100.0)
Monocytes Relative: 14 % — ABNORMAL HIGH (ref 3–12)
RBC: 4.21 MIL/uL — ABNORMAL LOW (ref 4.22–5.81)

## 2012-09-08 MED ORDER — FENTANYL CITRATE 0.05 MG/ML IJ SOLN: 50.0000 ug | INTRAMUSCULAR | Status: DC | PRN

## 2012-09-08 MED ORDER — ADULT MULTIVITAMIN W/MINERALS CH
1.0000 | ORAL_TABLET | Freq: Every day | ORAL | Status: DC
  Administered 2012-09-08: 1 via ORAL
  Filled 2012-09-08: qty 1

## 2012-09-08 MED ORDER — PANTOPRAZOLE SODIUM 40 MG IV SOLR
40.0000 mg | Freq: Once | INTRAVENOUS | Status: AC
  Administered 2012-09-08: 40 mg via INTRAVENOUS
  Filled 2012-09-08: qty 40

## 2012-09-08 MED ORDER — SODIUM CHLORIDE 0.9 % IV SOLN
INTRAVENOUS | Status: DC
  Administered 2012-09-08: 21:00:00 via INTRAVENOUS

## 2012-09-08 MED ORDER — LORAZEPAM 1 MG PO TABS: 0.0000 mg | ORAL_TABLET | Freq: Two times a day (BID) | ORAL | Status: DC

## 2012-09-08 MED ORDER — LORAZEPAM 1 MG PO TABS
1.0000 mg | ORAL_TABLET | Freq: Once | ORAL | Status: AC
  Administered 2012-09-08: 1 mg via ORAL
  Filled 2012-09-08: qty 1

## 2012-09-08 MED ORDER — VITAMIN B-1 100 MG PO TABS
100.0000 mg | ORAL_TABLET | Freq: Every day | ORAL | Status: DC
  Administered 2012-09-08: 100 mg via ORAL
  Filled 2012-09-08: qty 1

## 2012-09-08 MED ORDER — THIAMINE HCL 100 MG/ML IJ SOLN
100.0000 mg | Freq: Every day | INTRAMUSCULAR | Status: DC
  Filled 2012-09-08: qty 2

## 2012-09-08 MED ORDER — IOHEXOL 300 MG/ML  SOLN
100.0000 mL | Freq: Once | INTRAMUSCULAR | Status: AC | PRN
  Administered 2012-09-08: 100 mL via INTRAVENOUS

## 2012-09-08 MED ORDER — LORAZEPAM 1 MG PO TABS: 1.0000 mg | ORAL_TABLET | Freq: Four times a day (QID) | ORAL | Status: DC | PRN

## 2012-09-08 MED ORDER — LORAZEPAM 1 MG PO TABS: 0.0000 mg | ORAL_TABLET | Freq: Four times a day (QID) | ORAL | Status: DC

## 2012-09-08 MED ORDER — LORAZEPAM 2 MG/ML IJ SOLN: 1.0000 mg | Freq: Four times a day (QID) | INTRAMUSCULAR | Status: DC | PRN

## 2012-09-08 MED ORDER — FOLIC ACID 1 MG PO TABS
1.0000 mg | ORAL_TABLET | Freq: Every day | ORAL | Status: DC
  Administered 2012-09-08: 1 mg via ORAL
  Filled 2012-09-08: qty 1

## 2012-09-08 NOTE — H&P (Signed)
Phillip Kemp is an 51 y.o. male.    PCP: Milinda Antis, MD   Chief Complaint: Abdominal pain with nausea, vomiting, for a week.  HPI: This is a 51 year old, African American male, with a past medical history of alcoholism and cocaine abuse, who was in his usual state of health about a week ago, when he started having pain in the abdomen. The pain was diffusely present all over the abdomen. Currently, the pain is 4/10 with radiation to the sides. He's been having nausea and vomiting, with whitish emesis. Denies any blood in the emesis. Lately he's been having dry heaves. He also reports loose stools. Had some blood in the stool earlier today, which was small quantity, but none since then. Also admits to smoking cocaine all day. He had 2 40 ounces bottle of beer today, but usually has 6-7 bottles every day. He also drinks wine, about one bottle every day. Denies any liquor use. Denies any fever or chills. No chest pains. Denies any black stools.   Home Medications: Prior to Admission medications   Medication Sig Start Date End Date Taking? Authorizing Provider  acetaminophen (TYLENOL ARTHRITIS PAIN) 650 MG CR tablet Take 650 mg by mouth every 8 (eight) hours as needed. Arthritis Pain    Yes Historical Provider, MD  Multiple Vitamin (MULTIVITAMIN PO) Take 1 tablet by mouth daily.     Yes Historical Provider, MD    Allergies: No Known Allergies  Past Medical History: Past Medical History  Diagnosis Date  . Arthritis   . Substance abuse     Cocaine and alcohol    Past Surgical History  Procedure Date  . Orthopedic surgery     Left foot surgery    Social History:  reports that he has been smoking.  He does not have any smokeless tobacco history on file. He reports that he drinks about 28.8 ounces of alcohol per week. He reports that he uses illicit drugs (Cocaine). he lives with his mother.  Family History:  Family History  Problem Relation Age of Onset  . Heart disease  Mother   . Cancer Father     prostate    Review of Systems - History obtained from the patient General ROS: negative Psychological ROS: negative Ophthalmic ROS: negative ENT ROS: negative Allergy and Immunology ROS: negative Hematological and Lymphatic ROS: negative Endocrine ROS: negative Respiratory ROS: no cough, shortness of breath, or wheezing Cardiovascular ROS: no chest pain or dyspnea on exertion Gastrointestinal ROS: as in hpi Genito-Urinary ROS: no dysuria, trouble voiding, or hematuria Musculoskeletal ROS: negative Neurological ROS: no TIA or stroke symptoms Dermatological ROS: negative  Physical Examination Blood pressure 127/63, pulse 94, temperature 98.5 F (36.9 C), temperature source Oral, resp. rate 17, height 6\' 1"  (1.854 m), weight 64.864 kg (143 lb), SpO2 100.00%.  General appearance: alert, cooperative, appears stated age and no distress Head: Normocephalic, without obvious abnormality, atraumatic Eyes: conjunctivae/corneas clear. PERRL, EOM's intact.  Throat: lips, mucosa, and tongue normal; teeth and gums normal Neck: no adenopathy, no carotid bruit, no JVD, supple, symmetrical, trachea midline and thyroid not enlarged, symmetric, no tenderness/mass/nodules Back: symmetric, no curvature. ROM normal. No CVA tenderness. Resp: clear to auscultation bilaterally Cardio: regular rate and rhythm, S1, S2 normal, no murmur, click, rub or gallop GI: Abdomen is soft. He's got tenderness diffusely. He does have some guarding, but no rebound, or rigidity. Bowel sounds are present. No masses, or organomegaly is appreciated. Extremities: extremities normal, atraumatic, no cyanosis or edema  Pulses: 2+ and symmetric Skin: Skin color, texture, turgor normal. No rashes or lesions Lymph nodes: Cervical, supraclavicular, and axillary nodes normal. Neurologic: Grossly normal  Laboratory Data: Results for orders placed during the hospital encounter of 09/08/12 (from the past  48 hour(s))  CBC WITH DIFFERENTIAL     Status: Abnormal   Collection Time   09/08/12  7:11 PM      Component Value Range Comment   WBC 5.8  4.0 - 10.5 K/uL    RBC 4.21 (*) 4.22 - 5.81 MIL/uL    Hemoglobin 11.1 (*) 13.0 - 17.0 g/dL    HCT 16.1 (*) 09.6 - 52.0 %    MCV 77.9 (*) 78.0 - 100.0 fL    MCH 26.4  26.0 - 34.0 pg    MCHC 33.8  30.0 - 36.0 g/dL    RDW 04.5  40.9 - 81.1 %    Platelets 180  150 - 400 K/uL    Neutrophils Relative 50  43 - 77 %    Neutro Abs 2.9  1.7 - 7.7 K/uL    Lymphocytes Relative 35  12 - 46 %    Lymphs Abs 2.0  0.7 - 4.0 K/uL    Monocytes Relative 14 (*) 3 - 12 %    Monocytes Absolute 0.8  0.1 - 1.0 K/uL    Eosinophils Relative 1  0 - 5 %    Eosinophils Absolute 0.1  0.0 - 0.7 K/uL    Basophils Relative 0  0 - 1 %    Basophils Absolute 0.0  0.0 - 0.1 K/uL   COMPREHENSIVE METABOLIC PANEL     Status: Abnormal   Collection Time   09/08/12  7:16 PM      Component Value Range Comment   Sodium 138  135 - 145 mEq/L    Potassium 4.1  3.5 - 5.1 mEq/L    Chloride 106  96 - 112 mEq/L    CO2 23  19 - 32 mEq/L    Glucose, Bld 110 (*) 70 - 99 mg/dL    BUN 14  6 - 23 mg/dL    Creatinine, Ser 9.14  0.50 - 1.35 mg/dL    Calcium 9.4  8.4 - 78.2 mg/dL    Total Protein 6.3  6.0 - 8.3 g/dL    Albumin 3.0 (*) 3.5 - 5.2 g/dL    AST 35  0 - 37 U/L    ALT 49  0 - 53 U/L    Alkaline Phosphatase 110  39 - 117 U/L    Total Bilirubin 0.3  0.3 - 1.2 mg/dL    GFR calc non Af Amer >90  >90 mL/min    GFR calc Af Amer >90  >90 mL/min   LIPASE, BLOOD     Status: Abnormal   Collection Time   09/08/12  7:16 PM      Component Value Range Comment   Lipase 131 (*) 11 - 59 U/L   ETHANOL     Status: Normal   Collection Time   09/08/12  7:16 PM      Component Value Range Comment   Alcohol, Ethyl (B) <11  0 - 11 mg/dL   URINE RAPID DRUG SCREEN (HOSP PERFORMED)     Status: Abnormal   Collection Time   09/08/12  7:45 PM      Component Value Range Comment   Opiates NONE DETECTED  NONE  DETECTED    Cocaine POSITIVE (*) NONE DETECTED    Benzodiazepines  NONE DETECTED  NONE DETECTED    Amphetamines NONE DETECTED  NONE DETECTED    Tetrahydrocannabinol POSITIVE (*) NONE DETECTED    Barbiturates NONE DETECTED  NONE DETECTED     Radiology Reports: Ct Abdomen Pelvis W Contrast  09/08/2012  *RADIOLOGY REPORT*  Clinical Data: Ethanol abuse and crack cocaine abuse.  Bloody stools.  Abdominal pain.  CT ABDOMEN AND PELVIS WITH CONTRAST  Technique:  Multidetector CT imaging of the abdomen and pelvis was performed following the standard protocol during bolus administration of intravenous contrast.  Contrast: OMNIPAQUE IOHEXOL 300 MG/ML  SOLN  Comparison: 10/21/2009.  Findings: Lung Bases: Mild dependent atelectasis in the lungs. Suggestion of mild right heart enlargement.  Liver:  Tiny hypervascular lesion in the right hepatic lobe (image 25 series 2) which is not present on delayed imaging compatible with a small flash fill hemangioma.  Fatty infiltration adjacent to the falciform ligament of the liver.  No focal mass lesion.  Spleen:  Normal.  Gallbladder:  Contracted.  No calcified stones.  Common bile duct:  Mildly enlarged for age, probably due to prior passage of stones.  No calcified common duct stones. Recommend correlation with bilirubin.  Pancreas:  Normal.  Adrenal glands:  There is a left adrenal mass measuring 33 mm x 19 mm.  Follow-up adrenal MRI recommended for further characterization.  Differential considerations include adenoma, metastatic disease or primary adrenal neoplasm.  This appears distinct from the posterior gastric fundus and gastric diverticulum is considered unlikely.  Kidneys:  Normal renal enhancement and excretion.  Stomach:  Normal.  Small bowel:  No obstruction.  No inflammatory changes.  No mesenteric adenopathy.  Colon:   Normal appendix.  No colonic inflammatory changes.  High position of the cecum.  Pelvic Genitourinary:  Prostatic calcifications and  prostatomegaly. No free fluid in the pelvis.  Urinary bladder appears within normal limits.  Bones:  No aggressive osseous lesions.  Vasculature: Atherosclerosis.  No acute abnormality.  IMPRESSION: 1.  No acute abnormality. 2.  Tiny hypervascular lesion measuring 9 mm in the right hepatic lobe probably represents a small flash fill hemangioma. 3.  Left adrenal mass measuring 33 mm x 19 mm.  Differential considerations discussed above.  Follow-up adrenal MRI recommended for further assessment. Non-emergent MRI should be deferred until patient has been discharged for the acute illness, and can optimally cooperate with positioning and breath-holding instructions. 4.  Mild enlargement of the common bile duct.  This may relate to prior passage of stones or sphincter dysfunction.  No calcified common duct stone identified.  Correlate with bilirubin and liver function tests. 5.  Atherosclerosis.   Original Report Authenticated By: Andreas Newport, M.D.      Assessment/Plan  Principal Problem:  *Acute alcoholic pancreatitis Active Problems:  Rectal bleeding  Cocaine abuse  Anemia  Left adrenal mass   #1 acute alcoholic pancreatitis: He'll be kept on clear liquids. IV fluids will be provided. Lipase will be followed up in the morning.  #2. Minimal rectal bleeding: Hemoglobin will be monitored. At this time there is no clear role for intervention. If he has significant bleed GI will be consulted. PPIs will be initiated. Coagulation panel will be checked  #3 left adrenal mass: This will require workup, however, can be pursued as an outpatient. I will defer this to the rounding physicians.  #4 anemia: This is mild. Followup hemoglobin in the morning.  #5 history of cocaine and alcohol abuse: Alcohol withdrawal protocol will be initiated. The Ativan should help  with, cocaine withdrawal as well. He's already been seen by behavioral health. However, they would like for him to be medically cleared before they  will accept him into detox/rehabilitation.  #6 DVT, prophylaxis with SCD's.  He is a full code.  Further management decisions will depend on results of further testing and patient's response to treatment.  [facility]  [facility] Pager [phone]  09/08/2012, 10:49 PM

## 2012-09-08 NOTE — ED Provider Notes (Signed)
History     CSN: [id number]  Arrival date & time 09/08/12  4098   First MD Initiated Contact with Patient 09/08/12 1853      Chief Complaint  Patient presents with  . V70.1    detox from crack cocaine and alcohol    HPI Pt was seen at 1900.  Per pt, c/o gradual onset and persistence of constant etoh and cocaine abuse for the past several years.  Has come to the ED today requesting detox.  LD etoh 1430 today, LD crack approx this morning.  States he is "depressed" and endorses SI with plan to "take some pills."  Pt also c/o generalized abd pain and nausea/vomiting for the past week.  States he noted "blood in my stool" today. Denies black or blood in emesis, no CP/SOB, no back pain, no fevers, no rash.        Past Medical History  Diagnosis Date  . Arthritis   . Substance abuse     Cocaine and alcohol    Past Surgical History  Procedure Date  . Orthopedic surgery     Left foot surgery    Family History  Problem Relation Age of Onset  . Heart disease Mother   . Cancer Father     prostate    History  Substance Use Topics  . Smoking status: Current Every Day Smoker -- 0.5 packs/day  . Smokeless tobacco: Not on file  . Alcohol Use: 28.8 oz/week    48 Cans of beer per week     daily      Review of Systems ROS: Statement: All systems negative except as marked or noted in the HPI; Constitutional: Negative for fever and chills. ; ; Eyes: Negative for eye pain, redness and discharge. ; ; ENMT: Negative for ear pain, hoarseness, nasal congestion, sinus pressure and sore throat. ; ; Cardiovascular: Negative for chest pain, palpitations, diaphoresis, dyspnea and peripheral edema. ; ; Respiratory: Negative for cough, wheezing and stridor. ; ; Gastrointestinal: +N/V, abd pain, blood in stool. Negative for diarrhea, hematemesis, jaundice and rectal bleeding. . ; ; Genitourinary: Negative for dysuria, flank pain and hematuria. ; ; Musculoskeletal: Negative for back pain and neck  pain. Negative for swelling and trauma.; ; Skin: Negative for pruritus, rash, abrasions, blisters, bruising and skin lesion.; ; Neuro: Negative for headache, lightheadedness and neck stiffness. Negative for weakness, altered level of consciousness , altered mental status, extremity weakness, paresthesias, involuntary movement, seizure and syncope.; Psych:  +depression, SI.  No SA, no HI, no hallucinations.         Allergies  Review of patient's allergies indicates no known allergies.  Home Medications   Current Outpatient Rx  Name Route Sig Dispense Refill  . ACETAMINOPHEN ER 650 MG PO TBCR Oral Take 650 mg by mouth every 8 (eight) hours as needed. Arthritis Pain     . MULTIVITAMIN PO Oral Take 1 tablet by mouth daily.        BP 130/78  Pulse 102  Temp 98.5 F (36.9 C) (Oral)  Resp 20  Ht 6\' 1"  (1.854 m)  Wt 143 lb (64.864 kg)  BMI 18.87 kg/m2  SpO2 100%  Physical Exam 1905: Physical examination:  Nursing notes reviewed; Vital signs and O2 SAT reviewed;  Constitutional: Well developed, Well nourished, Well hydrated, In no acute distress; Head:  Normocephalic, atraumatic; Eyes: EOMI, PERRL, No scleral icterus; ENMT: Mouth and pharynx normal, Mucous membranes moist; Neck: Supple, Full range of motion, No lymphadenopathy; Cardiovascular: Regular  rate and rhythm, No murmur, rub, or gallop; Respiratory: Breath sounds clear & equal bilaterally, No rales, rhonchi, wheezes.  Speaking full sentences with ease, Normal respiratory effort/excursion; Chest: Nontender, Movement normal; Abdomen: Soft, +diffuse tenderness to palp. No rebound or guarding. Nondistended, Normal bowel sounds; Rectal exam performed w/permission of pt and ED RN chaparone present.  Anal tone normal.  Non-tender, soft light brown stool in rectal vault, heme positive.  No fissures, no external hemorrhoids, no palp masses.;; Genitourinary: No CVA tenderness; Extremities: Pulses normal, No tenderness, No edema, No calf edema or  asymmetry.; Neuro: AA&Ox3, Major CN grossly intact.  Speech clear. No gross focal motor or sensory deficits in extremities.; Skin: Color normal, Warm, Dry.; Psych:  Affect flat, poor eye contact.    ED Course  Procedures    MDM  MDM Reviewed: previous chart, nursing note and vitals Reviewed previous: labs Interpretation: labs and CT scan    Results for orders placed during the hospital encounter of 09/08/12  URINE RAPID DRUG SCREEN (HOSP PERFORMED)      Component Value Range   Opiates NONE DETECTED  NONE DETECTED   Cocaine POSITIVE (*) NONE DETECTED   Benzodiazepines NONE DETECTED  NONE DETECTED   Amphetamines NONE DETECTED  NONE DETECTED   Tetrahydrocannabinol POSITIVE (*) NONE DETECTED   Barbiturates NONE DETECTED  NONE DETECTED  COMPREHENSIVE METABOLIC PANEL      Component Value Range   Sodium 138  135 - 145 mEq/L   Potassium 4.1  3.5 - 5.1 mEq/L   Chloride 106  96 - 112 mEq/L   CO2 23  19 - 32 mEq/L   Glucose, Bld 110 (*) 70 - 99 mg/dL   BUN 14  6 - 23 mg/dL   Creatinine, Ser 1.30  0.50 - 1.35 mg/dL   Calcium 9.4  8.4 - 86.5 mg/dL   Total Protein 6.3  6.0 - 8.3 g/dL   Albumin 3.0 (*) 3.5 - 5.2 g/dL   AST 35  0 - 37 U/L   ALT 49  0 - 53 U/L   Alkaline Phosphatase 110  39 - 117 U/L   Total Bilirubin 0.3  0.3 - 1.2 mg/dL   GFR calc non Af Amer >90  >90 mL/min   GFR calc Af Amer >90  >90 mL/min  LIPASE, BLOOD      Component Value Range   Lipase 131 (*) 11 - 59 U/L  ETHANOL      Component Value Range   Alcohol, Ethyl (B) <11  0 - 11 mg/dL  CBC WITH DIFFERENTIAL      Component Value Range   WBC 5.8  4.0 - 10.5 K/uL   RBC 4.21 (*) 4.22 - 5.81 MIL/uL   Hemoglobin 11.1 (*) 13.0 - 17.0 g/dL   HCT 78.4 (*) 69.6 - 29.5 %   MCV 77.9 (*) 78.0 - 100.0 fL   MCH 26.4  26.0 - 34.0 pg   MCHC 33.8  30.0 - 36.0 g/dL   RDW 28.4  13.2 - 44.0 %   Platelets 180  150 - 400 K/uL   Neutrophils Relative 50  43 - 77 %   Neutro Abs 2.9  1.7 - 7.7 K/uL   Lymphocytes Relative 35  12  - 46 %   Lymphs Abs 2.0  0.7 - 4.0 K/uL   Monocytes Relative 14 (*) 3 - 12 %   Monocytes Absolute 0.8  0.1 - 1.0 K/uL   Eosinophils Relative 1  0 - 5 %  Eosinophils Absolute 0.1  0.0 - 0.7 K/uL   Basophils Relative 0  0 - 1 %   Basophils Absolute 0.0  0.0 - 0.1 K/uL   Ct Abdomen Pelvis W Contrast 09/08/2012  *RADIOLOGY REPORT*  Clinical Data: Ethanol abuse and crack cocaine abuse.  Bloody stools.  Abdominal pain.  CT ABDOMEN AND PELVIS WITH CONTRAST  Technique:  Multidetector CT imaging of the abdomen and pelvis was performed following the standard protocol during bolus administration of intravenous contrast.  Contrast: OMNIPAQUE IOHEXOL 300 MG/ML  SOLN  Comparison: 10/21/2009.  Findings: Lung Bases: Mild dependent atelectasis in the lungs. Suggestion of mild right heart enlargement.  Liver:  Tiny hypervascular lesion in the right hepatic lobe (image 25 series 2) which is not present on delayed imaging compatible with a small flash fill hemangioma.  Fatty infiltration adjacent to the falciform ligament of the liver.  No focal mass lesion.  Spleen:  Normal.  Gallbladder:  Contracted.  No calcified stones.  Common bile duct:  Mildly enlarged for age, probably due to prior passage of stones.  No calcified common duct stones. Recommend correlation with bilirubin.  Pancreas:  Normal.  Adrenal glands:  There is a left adrenal mass measuring 33 mm x 19 mm.  Follow-up adrenal MRI recommended for further characterization.  Differential considerations include adenoma, metastatic disease or primary adrenal neoplasm.  This appears distinct from the posterior gastric fundus and gastric diverticulum is considered unlikely.  Kidneys:  Normal renal enhancement and excretion.  Stomach:  Normal.  Small bowel:  No obstruction.  No inflammatory changes.  No mesenteric adenopathy.  Colon:   Normal appendix.  No colonic inflammatory changes.  High position of the cecum.  Pelvic Genitourinary:  Prostatic calcifications and  prostatomegaly. No free fluid in the pelvis.  Urinary bladder appears within normal limits.  Bones:  No aggressive osseous lesions.  Vasculature: Atherosclerosis.  No acute abnormality.  IMPRESSION: 1.  No acute abnormality. 2.  Tiny hypervascular lesion measuring 9 mm in the right hepatic lobe probably represents a small flash fill hemangioma. 3.  Left adrenal mass measuring 33 mm x 19 mm.  Differential considerations discussed above.  Follow-up adrenal MRI recommended for further assessment. Non-emergent MRI should be deferred until patient has been discharged for the acute illness, and can optimally cooperate with positioning and breath-holding instructions. 4.  Mild enlargement of the common bile duct.  This may relate to prior passage of stones or sphincter dysfunction.  No calcified common duct stone identified.  Correlate with bilirubin and liver function tests. 5.  Atherosclerosis.   Original Report Authenticated By: Andreas Newport, M.D.     Results for TOLBERT, GAAL (MRN [id number]) as of 09/08/2012 22:25  Ref. Range 05/10/2010 07:20 05/10/2010 07:26 08/25/2011 13:11 10/20/2011 13:49 09/08/2012 19:11  Hemoglobin Latest Range: 13.0-17.0 g/dL 63.8 75.6 (L) 43.3 29.5 11.1 (L)  HCT Latest Range: 39.0-52.0 % 38.7 (L) 38.0 (L) 39.3 39.9 32.8 (L)     2230:  Pt with stool heme+ today.  No stooling while in the ED.  Pt tremorous; alcohol withdrawal protocol started shortly after pt arrival.  ACT Samson Frederic has eval pt:  [facility] would like to accept pt, pt are concerned regarding pt's elevated lipase and c/o rectal bleeding with lower H/H than usual, and are requesting initial medical admit to assure pt stabilization.  Dx testing d/w pt.  Questions answered.  Verb understanding, agreeable to observation admit.  T/C to [facility] Dr. Rito Ehrlich, case discussed, including:  HPI, pertinent  PM/SHx, VS/PE, dx testing, ED course and treatment:  Agreeable to observation admit, requests to obtain tele bed to team 1.   Laray Anger, DO 09/10/12 1230

## 2012-09-08 NOTE — ED Notes (Signed)
Patient requesting something to eat, physician states that he can't have anything at this time. Patient is aware that he is NPO at this time

## 2012-09-08 NOTE — ED Notes (Signed)
Patient resting in the bed at this time, report called to floor.
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
## 2012-09-08 NOTE — ED Notes (Addendum)
Reports ETOH and crack cocaine abuse; reports drinks approx 2 24 pks of beer daily; reports uses approx $200/day of crack cocaine; states last drink at 1430 today; states last used crack approx 10 hours ago. Reports suicidal ideation without a plan.  Also reports blood in stool.

## 2012-09-09 ENCOUNTER — Encounter (HOSPITAL_COMMUNITY): Payer: Self-pay | Admitting: Internal Medicine

## 2012-09-09 DIAGNOSIS — R45851 Suicidal ideations: Secondary | ICD-10-CM

## 2012-09-09 DIAGNOSIS — D500 Iron deficiency anemia secondary to blood loss (chronic): Secondary | ICD-10-CM

## 2012-09-09 HISTORY — DX: Iron deficiency anemia secondary to blood loss (chronic): D50.0

## 2012-09-09 HISTORY — DX: Suicidal ideations: R45.851

## 2012-09-09 LAB — COMPREHENSIVE METABOLIC PANEL
AST: 41 U/L — ABNORMAL HIGH (ref 0–37)
Albumin: 2.8 g/dL — ABNORMAL LOW (ref 3.5–5.2)
Alkaline Phosphatase: 104 U/L (ref 39–117)
CO2: 24 mEq/L (ref 19–32)
Chloride: 107 mEq/L (ref 96–112)
Creatinine, Ser: 0.72 mg/dL (ref 0.50–1.35)
GFR calc non Af Amer: >90 mL/min (ref >90)
Potassium: 3.8 mEq/L (ref 3.5–5.1)
Total Bilirubin: 0.6 mg/dL (ref 0.3–1.2)

## 2012-09-09 LAB — CBC
MCH: 27 pg (ref 26.0–34.0)
MCHC: 34.3 g/dL (ref 30.0–36.0)
MCV: 78.6 fL (ref 78.0–100.0)
Platelets: 169 10*3/uL (ref 150–400)
RBC: 3.78 MIL/uL — ABNORMAL LOW (ref 4.22–5.81)
RDW: 13.9 % (ref 11.5–15.5)

## 2012-09-09 LAB — LIPASE, BLOOD: Lipase: 46 U/L (ref 11–59)

## 2012-09-09 MED ORDER — SODIUM CHLORIDE 0.9 % IJ SOLN
3.0000 mL | Freq: Two times a day (BID) | INTRAMUSCULAR | Status: DC
  Administered 2012-09-09 – 2012-09-10 (×2): 3 mL via INTRAVENOUS

## 2012-09-09 MED ORDER — LORAZEPAM 1 MG PO TABS: 1.0000 mg | ORAL_TABLET | Freq: Four times a day (QID) | ORAL | Status: DC | PRN

## 2012-09-09 MED ORDER — HYDROMORPHONE HCL PF 1 MG/ML IJ SOLN
0.5000 mg | INTRAMUSCULAR | Status: DC | PRN
  Administered 2012-09-09: 0.5 mg via INTRAVENOUS
  Filled 2012-09-09: qty 1

## 2012-09-09 MED ORDER — PNEUMOCOCCAL VAC POLYVALENT 25 MCG/0.5ML IJ INJ
0.5000 mL | INJECTION | INTRAMUSCULAR | Status: AC
  Administered 2012-09-10: 0.5 mL via INTRAMUSCULAR
  Filled 2012-09-09: qty 0.5

## 2012-09-09 MED ORDER — ACETAMINOPHEN 325 MG PO TABS: 650.0000 mg | ORAL_TABLET | Freq: Four times a day (QID) | ORAL | Status: DC | PRN

## 2012-09-09 MED ORDER — FOLIC ACID 1 MG PO TABS
1.0000 mg | ORAL_TABLET | Freq: Every day | ORAL | Status: DC
Start: 2012-09-09 — End: 2012-09-17

## 2012-09-09 MED ORDER — SODIUM CHLORIDE 0.9 % IJ SOLN
INTRAMUSCULAR | Status: AC
  Administered 2012-09-09: 10 mL
  Filled 2012-09-09: qty 3

## 2012-09-09 MED ORDER — LORAZEPAM 1 MG PO TABS
0.0000 mg | ORAL_TABLET | Freq: Two times a day (BID) | ORAL | Status: DC
  Administered 2012-09-11: 2 mg via ORAL

## 2012-09-09 MED ORDER — SODIUM CHLORIDE 0.9 % IJ SOLN
INTRAMUSCULAR | Status: AC
  Administered 2012-09-09: 3 mL
  Filled 2012-09-09: qty 3

## 2012-09-09 MED ORDER — THIAMINE HCL 100 MG PO TABS: 100.0000 mg | ORAL_TABLET | Freq: Every day | ORAL | Status: DC

## 2012-09-09 MED ORDER — ONDANSETRON HCL 4 MG PO TABS: 4.0000 mg | ORAL_TABLET | Freq: Four times a day (QID) | ORAL | Status: DC | PRN

## 2012-09-09 MED ORDER — PANTOPRAZOLE SODIUM 40 MG PO TBEC: 40.0000 mg | DELAYED_RELEASE_TABLET | Freq: Every day | ORAL | Status: DC

## 2012-09-09 MED ORDER — SODIUM CHLORIDE 0.9 % IV SOLN
INTRAVENOUS | Status: DC
  Administered 2012-09-09 – 2012-09-10 (×3): via INTRAVENOUS

## 2012-09-09 MED ORDER — LORAZEPAM 1 MG PO TABS
0.0000 mg | ORAL_TABLET | Freq: Four times a day (QID) | ORAL | Status: AC
  Filled 2012-09-09: qty 2

## 2012-09-09 MED ORDER — INFLUENZA VIRUS VACC SPLIT PF IM SUSP
0.5000 mL | INTRAMUSCULAR | Status: AC
  Administered 2012-09-10: 0.5 mL via INTRAMUSCULAR
  Filled 2012-09-09: qty 0.5

## 2012-09-09 MED ORDER — FOLIC ACID 1 MG PO TABS
1.0000 mg | ORAL_TABLET | Freq: Every day | ORAL | Status: DC
  Administered 2012-09-09 – 2012-09-11 (×3): 1 mg via ORAL
  Filled 2012-09-09 (×3): qty 1

## 2012-09-09 MED ORDER — ACETAMINOPHEN 650 MG RE SUPP: 650.0000 mg | Freq: Four times a day (QID) | RECTAL | Status: DC | PRN

## 2012-09-09 MED ORDER — PANTOPRAZOLE SODIUM 40 MG IV SOLR
40.0000 mg | INTRAVENOUS | Status: DC
  Administered 2012-09-09: 40 mg via INTRAVENOUS
  Filled 2012-09-09: qty 40

## 2012-09-09 MED ORDER — THIAMINE HCL 100 MG/ML IJ SOLN: 100.0000 mg | Freq: Every day | INTRAMUSCULAR | Status: DC

## 2012-09-09 MED ORDER — VITAMIN B-1 100 MG PO TABS
100.0000 mg | ORAL_TABLET | Freq: Every day | ORAL | Status: DC
  Administered 2012-09-09 – 2012-09-11 (×3): 100 mg via ORAL
  Filled 2012-09-09 (×3): qty 1

## 2012-09-09 MED ORDER — ONDANSETRON HCL 4 MG/2ML IJ SOLN: 4.0000 mg | Freq: Four times a day (QID) | INTRAMUSCULAR | Status: DC | PRN

## 2012-09-09 MED ORDER — ALBUTEROL SULFATE (5 MG/ML) 0.5% IN NEBU: 2.5000 mg | INHALATION_SOLUTION | RESPIRATORY_TRACT | Status: DC | PRN

## 2012-09-09 MED ORDER — LORAZEPAM 2 MG/ML IJ SOLN
1.0000 mg | Freq: Four times a day (QID) | INTRAMUSCULAR | Status: DC | PRN
  Administered 2012-09-09: 1 mg via INTRAVENOUS
  Administered 2012-09-09: 2 mg via INTRAVENOUS
  Filled 2012-09-09 (×2): qty 1

## 2012-09-09 MED ORDER — ADULT MULTIVITAMIN W/MINERALS CH
1.0000 | ORAL_TABLET | Freq: Every day | ORAL | Status: DC
  Administered 2012-09-09 – 2012-09-11 (×3): 1 via ORAL
  Filled 2012-09-09 (×3): qty 1

## 2012-09-09 NOTE — Discharge Summary (Addendum)
Physician Discharge Summary  Phillip Kemp AVW:[id number] DOB: Jun 14, 1961 DOA: 09/08/2012  PCP: Phillip Antis, MD  Admit date: 09/08/2012 Discharge date: 09/10/2012  Recommendations for Outpatient Follow-up:  1. The plan is for the patient to be transferred to an inpatient psychiatric facility for treatment of suicidal ideation and substance abuse. He was advised to followup with his primary care physician in 2 weeks. Arrangements are being made to schedule him for an outpatient evaluation by a gastroenterologist for his history of rectal bleeding.  Discharge Diagnoses:  1. Acute alcoholic pancreatitis. 2. Substance abuse including tobacco, marijuana, alcohol and cocaine. 3. Suicidal ideation/depression. 4. History of rectal bleeding. Positive family history for colon cancer. 5. Chronic blood loss anemia, superimposed on dilutional anemia from IV fluids. 6. Left adrenal mass measuring 3.3 x 1.9 cm. FOLLOWUP MRI RECOMMENDED IN THE OUTPATIENT SETTING. 7. Elevated SGOT secondary to alcohol abuse. 8. Loose stools, likely secondary to pancreatic insufficiency associated with acute pancreatitis. Resolved. 9. Erythematous pimples on leg. Resolving with antibacterial ointment.   Discharge Condition: Improved.  Diet recommendation: Low-fat/heart healthy.  Filed Weights   09/08/12 1817 09/09/12 0002  Weight: 64.864 kg (143 lb) 64.1 kg (141 lb 5 oz)    History of present illness:  The patient is a 51 year old with a history significant for alcoholism and cocaine abuse, who presented to the emergency department on 09/08/2012 with a chief complaint of abdominal pain, nausea, and vomiting for one week. He also reported 1 episode of rectal bleeding. He denied coffee grounds emesis or bright red blood in his emesis. His history is also significant for smoking crack cocaine all day and drinking 2-40 ounce beers daily and one bottle of wine daily. In the emergency department, he was noted to be  afebrile and hemodynamically stable. His lab data were significant for hemoglobin of 11.1, MCV of 78, lipase of 131, alcohol level of less than 11, and a urine drug screen positive for cocaine and THC. CT of his abdomen and pelvis revealed a left adrenal mass measuring 33 mm x 19 mm, tiny 9 mm right hepatic lobe lesion (probably a hemangioma) prostatomegaly and mild enlargement of the common bowel duct. The pancreas appeared normal. He was admitted for further evaluation and management.  Of note, the patient also expressed suicidal ideation in the emergency department which prompted a 24-hour sitter and ACT team referral.  Hospital Course:  The patient was started on IV fluids. He was made to be n.p.o. IV Protonix was initiated. As needed IV analgesics and as needed antiemetics were ordered. The Ativan alcohol withdrawal protocol was started with Ativan and vitamin therapy. Tobacco cessation counseling was ordered as well. As stated above, a 24-hour sitter was ordered for suicide precautions. His hemoglobin was monitored. He was monitored for alcohol withdrawal syndrome.  The patient elaborated on his suicidal thoughts. He had been unsuccessful in his attempt to stop using alcohol and cocaine specifically. He has become depressed and suicidal because of it. He has thought about taking "a bunch of pills" to end it all. He is very receptive to getting help with sobriety in an inpatient psychiatric setting.  The patient has had intermittent mild bright red blood per rectum. The ED physician performed a rectal exam and reported brown stool that was heme positive. There was no mention of hemorrhoids. A followup exam was not entertained. However, the patient's father does have a history of colon cancer. It was decided that he would be evaluated in the outpatient setting by a  gastroenterologist for further workup. His hemoglobin was low on admission at 11.1, and with IV fluid hydration, it decreased to 10.2.  Ferritin level, total iron, and vitamin B12 level were ordered. The results were pending at the time of anticipated discharge.  The ACT team was consulted. Their current evaluation and recommendations are pending. I believe the patient would benefit from inpatient psychiatric treatment for both suicidal ideation and substance abuse. He is medically cleared. His diet was advanced. His lipase has normalized. He has remained afebrile and hemodynamically stable.   Procedures:  None  Consultations:  ACT team pending.  Discharge Exam: Filed Vitals:   09/09/12 0500 09/09/12 1340 09/09/12 2048 09/10/12 0620  BP: 116/62 121/66 140/59 107/65  Pulse: 92 94 99 95  Temp:  98.3 F (36.8 C) 98.6 F (37 C) 98.6 F (37 C)  TempSrc:  Oral Oral Oral  Resp:  18 18 19   Height:      Weight:      SpO2:  100% 100% 98%    General: Pleasant 51 year old African-American man laying in bed, in no acute distress. Cardiovascular: S1, S2, with no murmurs rubs or gallops. Respiratory: Clear to auscultation bilaterally. Abdomen: Positive bowel sounds, soft, mildly tender in the epigastrium, no masses palpated, no distention, no guarding. Extremities: No pedal edema. Neuro/psychiatric: He is alert and oriented x3. Cranial nerves II through XII are intact. His speech is clear. He is not tremulous. He actually has a pleasant affect, but acknowledges some depression and suicide he ideation regarding his inability to stop abusing alcohol and cocaine. He is very receptive to assistance. Skin: small mildly erythematous subcentimeter pimples/papules on the proximal right leg numbering 3 and one on his right abdomen. Significantly less erythema. Nontender. No drainage.  Discharge Instructions      Discharge Orders    Future Appointments: Provider: Department: Dept Phone: Center:   09/23/2012 9:30 AM Phillip Scarlet, MD [facility] [phone] [facility]   10/04/2012 11:00 AM Phillip Retort, NP [facility] [phone] [facility]     Future Orders Please Complete By Expires   Diet - low sodium heart healthy      Diet - low sodium heart healthy      Increase activity slowly      Discharge instructions      Comments:   Followup with the gastroenterologist for evaluation of your rectal bleeding as scheduled. Continue to seek help for treatment of alcohol and cocaine abuse.   Increase activity slowly      Discharge instructions      Comments:   You'll need to followup with the gastroenterologist as scheduled for evaluation of your rectal bleeding. You will need a followup MRI of your abdomen in 3 months to assess your adrenal gland.       Medication List     As of 09/10/2012 10:16 AM    TAKE these medications         folic acid 1 MG tablet   Commonly known as: FOLVITE   Take 1 tablet (1 mg total) by mouth daily.      MULTIVITAMIN PO   Take 1 tablet by mouth daily.      neomycin-bacitracin-polymyxin ointment   Commonly known as: NEOSPORIN   Apply topically 3 (three) times daily. Apply to pimples on right thigh and skin on right abdomen for 5 more days.      pantoprazole 40 MG tablet   Commonly known as: PROTONIX   Take 1 tablet (  40 mg total) by mouth daily.      thiamine 100 MG tablet   Take 1 tablet (100 mg total) by mouth daily.      TYLENOL ARTHRITIS PAIN 650 MG CR tablet   Generic drug: acetaminophen   Take 650 mg by mouth every 8 (eight) hours as needed. Arthritis Pain         Follow-up Information    Follow up with Phillip Antis, MD. Schedule an appointment as soon as possible for a visit in 2 weeks. (f/u with Dr. Suzette Battiest @ 9:30)    Contact information:   [address] [place] [address] [phone]       Follow up with Gerrit Halls, NP. On 10/04/2012. (At 11:00 am for evaluation of rectal bleeding.)    Contact information:   [address] [place] [address] [phone]           The results of significant diagnostics from this  hospitalization (including imaging, microbiology, ancillary and laboratory) are listed below for reference.    Significant Diagnostic Studies: Ct Abdomen Pelvis W Contrast  09/08/2012  *RADIOLOGY REPORT*  Clinical Data: Ethanol abuse and crack cocaine abuse.  Bloody stools.  Abdominal pain.  CT ABDOMEN AND PELVIS WITH CONTRAST  Technique:  Multidetector CT imaging of the abdomen and pelvis was performed following the standard protocol during bolus administration of intravenous contrast.  Contrast: OMNIPAQUE IOHEXOL 300 MG/ML  SOLN  Comparison: 10/21/2009.  Findings: Lung Bases: Mild dependent atelectasis in the lungs. Suggestion of mild right heart enlargement.  Liver:  Tiny hypervascular lesion in the right hepatic lobe (image 25 series 2) which is not present on delayed imaging compatible with a small flash fill hemangioma.  Fatty infiltration adjacent to the falciform ligament of the liver.  No focal mass lesion.  Spleen:  Normal.  Gallbladder:  Contracted.  No calcified stones.  Common bile duct:  Mildly enlarged for age, probably due to prior passage of stones.  No calcified common duct stones. Recommend correlation with bilirubin.  Pancreas:  Normal.  Adrenal glands:  There is a left adrenal mass measuring 33 mm x 19 mm.  Follow-up adrenal MRI recommended for further characterization.  Differential considerations include adenoma, metastatic disease or primary adrenal neoplasm.  This appears distinct from the posterior gastric fundus and gastric diverticulum is considered unlikely.  Kidneys:  Normal renal enhancement and excretion.  Stomach:  Normal.  Small bowel:  No obstruction.  No inflammatory changes.  No mesenteric adenopathy.  Colon:   Normal appendix.  No colonic inflammatory changes.  High position of the cecum.  Pelvic Genitourinary:  Prostatic calcifications and prostatomegaly. No free fluid in the pelvis.  Urinary bladder appears within normal limits.  Bones:  No aggressive osseous  lesions.  Vasculature: Atherosclerosis.  No acute abnormality.  IMPRESSION: 1.  No acute abnormality. 2.  Tiny hypervascular lesion measuring 9 mm in the right hepatic lobe probably represents a small flash fill hemangioma. 3.  Left adrenal mass measuring 33 mm x 19 mm.  Differential considerations discussed above.  Follow-up adrenal MRI recommended for further assessment. Non-emergent MRI should be deferred until patient has been discharged for the acute illness, and can optimally cooperate with positioning and breath-holding instructions. 4.  Mild enlargement of the common bile duct.  This may relate to prior passage of stones or sphincter dysfunction.  No calcified common duct stone identified.  Correlate with bilirubin and liver function tests. 5.  Atherosclerosis.   Original  Report Authenticated By: Andreas Newport, M.D.     Microbiology: No results found for this or any previous visit (from the past 240 hour(s)).   Labs: Basic Metabolic Panel:  Lab 09/09/12 4098 09/08/12 1916  NA 137 138  K 3.8 4.1  CL 107 106  CO2 24 23  GLUCOSE 103* 110*  BUN 9 14  CREATININE 0.72 0.78  CALCIUM 9.6 9.4  MG -- --  PHOS -- --   Liver Function Tests:  Lab 09/09/12 0506 09/08/12 1916  AST 41* 35  ALT 51 49  ALKPHOS 104 110  BILITOT 0.6 0.3  PROT 5.7* 6.3  ALBUMIN 2.8* 3.0*    Lab 09/09/12 0506 09/08/12 1916  LIPASE 46 131*  AMYLASE -- --   No results found for this basename: AMMONIA:5 in the last 168 hours CBC:  Lab 09/09/12 0506 09/08/12 1911  WBC 6.0 5.8  NEUTROABS -- 2.9  HGB 10.2* 11.1*  HCT 29.7* 32.8*  MCV 78.6 77.9*  PLT 169 180   Cardiac Enzymes: No results found for this basename: CKTOTAL:5,CKMB:5,CKMBINDEX:5,TROPONINI:5 in the last 168 hours BNP: BNP (last 3 results) No results found for this basename: PROBNP:3 in the last 8760 hours CBG: No results found for this basename: GLUCAP:5 in the last 168 hours  Time coordinating discharge: Greater than 30   minutes  Signed:  Elige Shouse  [facility] 09/10/2012, 10:16 AM   Addendum: The patient developed several several papules/pimples on his right leg and one on his right abdomen. It appears to be some type of insect bite or focal folliculitis. Neosporin ointment was ordered and prescribed to be given 3 times a day for 5 more days. His loose stools have resolved. They were likely the consequence of pancreatic insufficiency in the setting of acute pancreatitis and refeeding after alcohol and substance abuse binging. Clinically, he does not have C. difficile colitis. Neither does he have MRSA cellulitis.  A behavioral health bed has now become available. He will be discharged there today. The patient was instructed to followup with his primary care physician Dr. Jeanice Lim and gastroenterology as scheduled.

## 2012-09-09 NOTE — Progress Notes (Signed)
UR Chart Review Completed  
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
## 2012-09-09 NOTE — Clinical Social Work Note (Signed)
CSW received consult. Per MD ACT team consulted for inpatient treatment. ACT team assessed pt in ED and secretary has already notified them of need for reassessment for transfer.  Derenda Fennel, [place] [phone]

## 2012-09-10 DIAGNOSIS — R238 Other skin changes: Secondary | ICD-10-CM | POA: Diagnosis not present

## 2012-09-10 LAB — IRON AND TIBC
Saturation Ratios: 34 % (ref 20–55)
TIBC: 171 ug/dL — ABNORMAL LOW (ref 215–435)
UIBC: 113 ug/dL — ABNORMAL LOW (ref 125–400)

## 2012-09-10 LAB — HEMOGLOBIN AND HEMATOCRIT, BLOOD: HCT: 31.8 % — ABNORMAL LOW (ref 39.0–52.0)

## 2012-09-10 MED ORDER — BACITRACIN-NEOMYCIN-POLYMYXIN 400-5-5000 EX OINT: TOPICAL_OINTMENT | Freq: Three times a day (TID) | CUTANEOUS | Status: DC

## 2012-09-10 MED ORDER — BACITRACIN-NEOMYCIN-POLYMYXIN 400-5-5000 EX OINT
TOPICAL_OINTMENT | Freq: Three times a day (TID) | CUTANEOUS | Status: DC
  Administered 2012-09-10 (×2): 1 via TOPICAL
  Administered 2012-09-10 – 2012-09-11 (×3): via TOPICAL
  Filled 2012-09-10 (×4): qty 1

## 2012-09-10 MED ORDER — SODIUM CHLORIDE 0.9 % IJ SOLN
INTRAMUSCULAR | Status: AC
  Administered 2012-09-10: 15:00:00
  Filled 2012-09-10: qty 3

## 2012-09-10 MED ORDER — BACITRACIN-NEOMYCIN-POLYMYXIN 400-5-5000 EX OINT: TOPICAL_OINTMENT | Freq: Three times a day (TID) | CUTANEOUS | Status: AC

## 2012-09-10 MED ORDER — PANTOPRAZOLE SODIUM 40 MG PO TBEC
40.0000 mg | DELAYED_RELEASE_TABLET | Freq: Every day | ORAL | Status: DC
  Administered 2012-09-10 – 2012-09-11 (×2): 40 mg via ORAL
  Filled 2012-09-10 (×2): qty 1

## 2012-09-10 NOTE — Progress Notes (Signed)
Subjective: The patient complains of new pimples that "popped up" on his right leg and abdomen overnight. He believes that he may have been bitten by something. Otherwise he has no complaints of abdominal pain, nausea, or vomiting. He is eating well, but says that he has had loose stools after he eats. He has not seen any blood in the stool over the past 48 hours.  Objective: Vital signs in last 24 hours: Filed Vitals:   09/09/12 0500 09/09/12 1340 09/09/12 2048 09/10/12 0620  BP: 116/62 121/66 140/59 107/65  Pulse: 92 94 99 95  Temp:  98.3 F (36.8 C) 98.6 F (37 C) 98.6 F (37 C)  TempSrc:  Oral Oral Oral  Resp:  18 18 19   Height:      Weight:      SpO2:  100% 100% 98%    Intake/Output Summary (Last 24 hours) at 09/10/12 0959 Last data filed at 09/10/12 0800  Gross per 24 hour  Intake   1660 ml  Output    700 ml  Net    960 ml    Weight change:   Physical exam: General: Pleasant 51 year old African-American man who is lying in bed, in no acute distress. Lungs: Clear to auscultation bilaterally. Heart: S1, S2, with no murmurs rubs or gallops. Abdomen: Positive bowel sounds, soft, nontender, nondistended. Extremities: No pedal edema. Skin: Small erythematous subcentimeter pimples/papules on his proximal right leg numbering 3 and 1 on his right abdomen. Nontender. No drainage. Neurologic/psychiatric: He is alert and oriented x3. Cranial nerves II through XII are intact. His affect is pleasant. His speech is clear. He still voices some depression and suicidal ideation, but is hopeful that with treatment, this would improve his outlook on life. He very much wants help with sobriety.  Lab Results: Basic Metabolic Panel:  Basename 09/09/12 0506 09/08/12 1916  NA 137 138  K 3.8 4.1  CL 107 106  CO2 24 23  GLUCOSE 103* 110*  BUN 9 14  CREATININE 0.72 0.78  CALCIUM 9.6 9.4  MG -- --  PHOS -- --   Liver Function Tests:  [facility] 09/09/12 0506 09/08/12 1916  AST 41* 35    ALT 51 49  ALKPHOS 104 110  BILITOT 0.6 0.3  PROT 5.7* 6.3  ALBUMIN 2.8* 3.0*    Basename 09/09/12 0506 09/08/12 1916  LIPASE 46 131*  AMYLASE -- --   No results found for this basename: AMMONIA:2 in the last 72 hours CBC:  Basename 09/09/12 0506 09/08/12 1911  WBC 6.0 5.8  NEUTROABS -- 2.9  HGB 10.2* 11.1*  HCT 29.7* 32.8*  MCV 78.6 77.9*  PLT 169 180   Cardiac Enzymes: No results found for this basename: CKTOTAL:3,CKMB:3,CKMBINDEX:3,TROPONINI:3 in the last 72 hours BNP: No results found for this basename: PROBNP:3 in the last 72 hours D-Dimer: No results found for this basename: DDIMER:2 in the last 72 hours CBG: No results found for this basename: GLUCAP:6 in the last 72 hours Hemoglobin A1C: No results found for this basename: HGBA1C in the last 72 hours Fasting Lipid Panel: No results found for this basename: CHOL,HDL,LDLCALC,TRIG,CHOLHDL,LDLDIRECT in the last 72 hours Thyroid Function Tests: No results found for this basename: TSH,T4TOTAL,FREET4,T3FREE,THYROIDAB in the last 72 hours Anemia Panel:  Basename 09/09/12 0950  VITAMINB12 --  FOLATE --  FERRITIN 480*  TIBC 171*  IRON 58  RETICCTPCT --   Coagulation:  Basename 09/09/12 0506  LABPROT 14.8  INR 1.18   Urine Drug Screen: Drugs of Abuse  Component Value Date/Time   LABOPIA NONE DETECTED 09/08/2012 1945   COCAINSCRNUR POSITIVE* 09/08/2012 1945   LABBENZ NONE DETECTED 09/08/2012 1945   AMPHETMU NONE DETECTED 09/08/2012 1945   THCU POSITIVE* 09/08/2012 1945   LABBARB NONE DETECTED 09/08/2012 1945    Alcohol Level:  Basename 09/08/12 1916  ETH <11   Urinalysis: No results found for this basename: COLORURINE:2,APPERANCEUR:2,LABSPEC:2,PHURINE:2,GLUCOSEU:2,HGBUR:2,BILIRUBINUR:2,KETONESUR:2,PROTEINUR:2,UROBILINOGEN:2,NITRITE:2,LEUKOCYTESUR:2 in the last 72 hours Misc. Labs:   Micro: No results found for this or any previous visit (from the past 240 hour(s)).  Studies/Results: Ct Abdomen  Pelvis W Contrast  09/08/2012  *RADIOLOGY REPORT*  Clinical Data: Ethanol abuse and crack cocaine abuse.  Bloody stools.  Abdominal pain.  CT ABDOMEN AND PELVIS WITH CONTRAST  Technique:  Multidetector CT imaging of the abdomen and pelvis was performed following the standard protocol during bolus administration of intravenous contrast.  Contrast: OMNIPAQUE IOHEXOL 300 MG/ML  SOLN  Comparison: 10/21/2009.  Findings: Lung Bases: Mild dependent atelectasis in the lungs. Suggestion of mild right heart enlargement.  Liver:  Tiny hypervascular lesion in the right hepatic lobe (image 25 series 2) which is not present on delayed imaging compatible with a small flash fill hemangioma.  Fatty infiltration adjacent to the falciform ligament of the liver.  No focal mass lesion.  Spleen:  Normal.  Gallbladder:  Contracted.  No calcified stones.  Common bile duct:  Mildly enlarged for age, probably due to prior passage of stones.  No calcified common duct stones. Recommend correlation with bilirubin.  Pancreas:  Normal.  Adrenal glands:  There is a left adrenal mass measuring 33 mm x 19 mm.  Follow-up adrenal MRI recommended for further characterization.  Differential considerations include adenoma, metastatic disease or primary adrenal neoplasm.  This appears distinct from the posterior gastric fundus and gastric diverticulum is considered unlikely.  Kidneys:  Normal renal enhancement and excretion.  Stomach:  Normal.  Small bowel:  No obstruction.  No inflammatory changes.  No mesenteric adenopathy.  Colon:   Normal appendix.  No colonic inflammatory changes.  High position of the cecum.  Pelvic Genitourinary:  Prostatic calcifications and prostatomegaly. No free fluid in the pelvis.  Urinary bladder appears within normal limits.  Bones:  No aggressive osseous lesions.  Vasculature: Atherosclerosis.  No acute abnormality.  IMPRESSION: 1.  No acute abnormality. 2.  Tiny hypervascular lesion measuring 9 mm in the right  hepatic lobe probably represents a small flash fill hemangioma. 3.  Left adrenal mass measuring 33 mm x 19 mm.  Differential considerations discussed above.  Follow-up adrenal MRI recommended for further assessment. Non-emergent MRI should be deferred until patient has been discharged for the acute illness, and can optimally cooperate with positioning and breath-holding instructions. 4.  Mild enlargement of the common bile duct.  This may relate to prior passage of stones or sphincter dysfunction.  No calcified common duct stone identified.  Correlate with bilirubin and liver function tests. 5.  Atherosclerosis.   Original Report Authenticated By: Andreas Newport, M.D.     Medications:  Scheduled:   . folic acid  1 mg Oral Daily  . influenza  inactive virus vaccine  0.5 mL Intramuscular Tomorrow-1000  . LORazepam  0-4 mg Oral Q6H   Followed by  . LORazepam  0-4 mg Oral Q12H  . multivitamin with minerals  1 tablet Oral Daily  . neomycin-bacitracin-polymyxin   Topical TID  . pantoprazole  40 mg Oral Q1200  . pneumococcal 23 valent vaccine  0.5 mL Intramuscular Tomorrow-1000  .  sodium chloride  3 mL Intravenous Q12H  . sodium chloride      . sodium chloride      . thiamine  100 mg Oral Daily   Or  . thiamine  100 mg Intravenous Daily  . DISCONTD: pantoprazole (PROTONIX) IV  40 mg Intravenous Q24H   Continuous:   . DISCONTD: sodium chloride 100 mL/hr at 09/10/12 4540   JWJ:[id number], acetaminophen, albuterol, HYDROmorphone (DILAUDID) injection, LORazepam, LORazepam, ondansetron (ZOFRAN) IV, ondansetron  Assessment: Principal Problem:  *Acute alcoholic pancreatitis Active Problems:  Rectal bleeding  Cocaine abuse  Anemia  Left adrenal mass  Suicidal ideation  Chronic blood loss anemia  Papules   1. Acute alcoholic pancreatitis. Resolved. 2. Substance abuse including tobacco, marijuana, alcohol, and cocaine. No signs of withdrawal syndrome. We'll continue Ativan withdrawal  protocol and vitamin therapy. 3. Suicidal ideation. The act team has evaluated the patient. I am in agreement that the patient needs inpatient therapy for both depression and substance abuse treatment. The patient is very receptive and eagerly wants help with treatment of both. 4. History of rectal bleeding with a positive family history for colon cancer. The patient was scheduled to followup with gastroenterology in a couple weeks. 5. Chronic blood loss anemia superimposed on dilutional anemia from IV fluids. Stable. 6. Left adrenal mass measuring 3.3 x 1.9 cm. Followup MRI recommended in the outpatient setting. 7. Elevated SGOT secondary to alcohol abuse. 8. Small erythematous pimple/papules on skin. Etiology is unknown. It appears to be focal folliculitis or possibly some type of insect bite.   Plan:  1. As stated yesterday, the patient is medically stable for discharge to a psychiatric facility. Currently a bed is pending. (See discharge summary on 09/09/2012.) 2. We'll start Neosporin topical ointment on the erythematous papules. 3. Continue current management as stated above. However, we'll discontinue IV fluids and change Protonix to by mouth. 4. The patient will followup with gastroenterology in a couple weeks following discharge.    LOS: 2 days   Bette Brienza 09/10/2012, 9:59 AM
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
## 2012-09-10 NOTE — Progress Notes (Signed)
H/H labs received and ACT team notified at [facility] request. [facility] notified of labs by ACT Team. Unsure if bed available tonight. [facility] reviewing and will call night nurse, RN if bed becomes available. Roger Shelter, Raquel Sarna

## 2012-09-11 ENCOUNTER — Inpatient Hospital Stay (HOSPITAL_COMMUNITY)
Admission: RE | Admit: 2012-09-11 | Discharge: 2012-09-17 | DRG: 897 | Disposition: A | Discharge status: Home / Self Care | Payer: 59 | Source: Ambulatory Visit | Attending: Psychiatry | Admitting: Psychiatry

## 2012-09-11 DIAGNOSIS — R45851 Suicidal ideations: Secondary | ICD-10-CM

## 2012-09-11 DIAGNOSIS — F32A Depression, unspecified: Secondary | ICD-10-CM | POA: Diagnosis present

## 2012-09-11 DIAGNOSIS — F329 Major depressive disorder, single episode, unspecified: Secondary | ICD-10-CM | POA: Diagnosis present

## 2012-09-11 DIAGNOSIS — D649 Anemia, unspecified: Secondary | ICD-10-CM

## 2012-09-11 DIAGNOSIS — D500 Iron deficiency anemia secondary to blood loss (chronic): Secondary | ICD-10-CM | POA: Diagnosis present

## 2012-09-11 DIAGNOSIS — F3289 Other specified depressive episodes: Secondary | ICD-10-CM | POA: Diagnosis present

## 2012-09-11 DIAGNOSIS — M129 Arthropathy, unspecified: Secondary | ICD-10-CM | POA: Diagnosis present

## 2012-09-11 DIAGNOSIS — F1010 Alcohol abuse, uncomplicated: Principal | ICD-10-CM | POA: Diagnosis present

## 2012-09-11 DIAGNOSIS — K852 Alcohol induced acute pancreatitis without necrosis or infection: Secondary | ICD-10-CM

## 2012-09-11 DIAGNOSIS — F1910 Other psychoactive substance abuse, uncomplicated: Secondary | ICD-10-CM

## 2012-09-11 DIAGNOSIS — F1410 Cocaine abuse, uncomplicated: Secondary | ICD-10-CM

## 2012-09-11 MED ORDER — MAGNESIUM HYDROXIDE 400 MG/5ML PO SUSP: 30.0000 mL | Freq: Every day | ORAL | Status: DC | PRN

## 2012-09-11 MED ORDER — LOPERAMIDE HCL 2 MG PO CAPS: 2.0000 mg | ORAL_CAPSULE | ORAL | Status: AC | PRN

## 2012-09-11 MED ORDER — VITAMIN B-1 100 MG PO TABS
100.0000 mg | ORAL_TABLET | Freq: Every day | ORAL | Status: DC
  Administered 2012-09-12 – 2012-09-14 (×3): 100 mg via ORAL
  Filled 2012-09-11 (×5): qty 1

## 2012-09-11 MED ORDER — ADULT MULTIVITAMIN W/MINERALS CH
1.0000 | ORAL_TABLET | Freq: Every day | ORAL | Status: DC
  Administered 2012-09-12 – 2012-09-17 (×6): 1 via ORAL
  Filled 2012-09-11 (×8): qty 1

## 2012-09-11 MED ORDER — CHLORDIAZEPOXIDE HCL 25 MG PO CAPS
25.0000 mg | ORAL_CAPSULE | Freq: Four times a day (QID) | ORAL | Status: AC | PRN
  Administered 2012-09-11 – 2012-09-13 (×5): 25 mg via ORAL
  Filled 2012-09-11 (×5): qty 1

## 2012-09-11 MED ORDER — ACETAMINOPHEN 325 MG PO TABS
650.0000 mg | ORAL_TABLET | Freq: Four times a day (QID) | ORAL | Status: DC | PRN
  Administered 2012-09-15 – 2012-09-17 (×2): 650 mg via ORAL

## 2012-09-11 MED ORDER — ONDANSETRON 4 MG PO TBDP: 4.0000 mg | ORAL_TABLET | Freq: Four times a day (QID) | ORAL | Status: AC | PRN

## 2012-09-11 MED ORDER — TRAZODONE HCL 100 MG PO TABS
100.0000 mg | ORAL_TABLET | Freq: Every evening | ORAL | Status: DC | PRN
  Administered 2012-09-11 – 2012-09-16 (×8): 100 mg via ORAL
  Filled 2012-09-11 (×2): qty 1
  Filled 2012-09-11: qty 28
  Filled 2012-09-11 (×6): qty 1

## 2012-09-11 MED ORDER — ALUM & MAG HYDROXIDE-SIMETH 200-200-20 MG/5ML PO SUSP
30.0000 mL | ORAL | Status: DC | PRN
  Administered 2012-09-17: 30 mL via ORAL

## 2012-09-11 MED ORDER — HYDROXYZINE HCL 25 MG PO TABS
25.0000 mg | ORAL_TABLET | Freq: Four times a day (QID) | ORAL | Status: AC | PRN
  Administered 2012-09-12 – 2012-09-13 (×2): 25 mg via ORAL

## 2012-09-11 NOTE — Progress Notes (Signed)
Subjective: The patient has no complaints of loose stools or abdominal pain. His stools have solidified. He denies any gross blood in his stools. He denies nausea or vomiting. The pimples on his leg are resolving.  Objective: Vital signs in last 24 hours: Filed Vitals:   09/10/12 0620 09/10/12 1444 09/10/12 2117 09/11/12 0550  BP: 107/65 133/69 138/80 104/55  Pulse: 95 94 97 92  Temp: 98.6 F (37 C) 98.4 F (36.9 C) 98.7 F (37.1 C) 99.2 F (37.3 C)  TempSrc: Oral Oral Oral Oral  Resp: 19 20  20   Height:      Weight:      SpO2: 98% 98% 100% 100%    Intake/Output Summary (Last 24 hours) at 09/11/12 1059 Last data filed at 09/11/12 0800  Gross per 24 hour  Intake   1860 ml  Output      0 ml  Net   1860 ml    Weight change:   Physical exam: General: Pleasant 51 year old African-American man who is lying in bed, in no acute distress. Lungs: Clear to auscultation bilaterally. Heart: S1, S2, with no murmurs rubs or gallops. Abdomen: Positive bowel sounds, soft, nontender, nondistended. Extremities: No pedal edema. Skin: Small mildly erythematous subcentimeter pimples/papules on his proximal right leg numbering 3 and 1 on his right abdomen. Significantly less erythema. Nontender. No drainage. Neurologic/psychiatric: He is alert and oriented x3. Cranial nerves II through XII are intact. His affect is pleasant. His speech is clear. He still voices some depression and suicidal ideation, but is hopeful that with treatment, this would improve his outlook on life. He very much wants help with sobriety.  Lab Results: Basic Metabolic Panel:  Basename 09/09/12 0506 09/08/12 1916  NA 137 138  K 3.8 4.1  CL 107 106  CO2 24 23  GLUCOSE 103* 110*  BUN 9 14  CREATININE 0.72 0.78  CALCIUM 9.6 9.4  MG -- --  PHOS -- --   Liver Function Tests:  [facility] 09/09/12 0506 09/08/12 1916  AST 41* 35  ALT 51 49  ALKPHOS 104 110  BILITOT 0.6 0.3  PROT 5.7* 6.3  ALBUMIN 2.8* 3.0*     Basename 09/09/12 0506 09/08/12 1916  LIPASE 46 131*  AMYLASE -- --   No results found for this basename: AMMONIA:2 in the last 72 hours CBC:  Basename 09/10/12 1749 09/09/12 0506 09/08/12 1911  WBC -- 6.0 5.8  NEUTROABS -- -- 2.9  HGB 10.9* 10.2* --  HCT 31.8* 29.7* --  MCV -- 78.6 77.9*  PLT -- 169 180   Cardiac Enzymes: No results found for this basename: CKTOTAL:3,CKMB:3,CKMBINDEX:3,TROPONINI:3 in the last 72 hours BNP: No results found for this basename: PROBNP:3 in the last 72 hours D-Dimer: No results found for this basename: DDIMER:2 in the last 72 hours CBG: No results found for this basename: GLUCAP:6 in the last 72 hours Hemoglobin A1C: No results found for this basename: HGBA1C in the last 72 hours Fasting Lipid Panel: No results found for this basename: CHOL,HDL,LDLCALC,TRIG,CHOLHDL,LDLDIRECT in the last 72 hours Thyroid Function Tests: No results found for this basename: TSH,T4TOTAL,FREET4,T3FREE,THYROIDAB in the last 72 hours Anemia Panel:  Basename 09/09/12 0950  VITAMINB12 --  FOLATE --  FERRITIN 480*  TIBC 171*  IRON 58  RETICCTPCT --   Coagulation:  Basename 09/09/12 0506  LABPROT 14.8  INR 1.18   Urine Drug Screen: Drugs of Abuse     Component Value Date/Time   LABOPIA NONE DETECTED 09/08/2012 1945   COCAINSCRNUR  POSITIVE* 09/08/2012 1945   LABBENZ NONE DETECTED 09/08/2012 1945   AMPHETMU NONE DETECTED 09/08/2012 1945   THCU POSITIVE* 09/08/2012 1945   LABBARB NONE DETECTED 09/08/2012 1945    Alcohol Level:  Basename 09/08/12 1916  ETH <11   Urinalysis: No results found for this basename: COLORURINE:2,APPERANCEUR:2,LABSPEC:2,PHURINE:2,GLUCOSEU:2,HGBUR:2,BILIRUBINUR:2,KETONESUR:2,PROTEINUR:2,UROBILINOGEN:2,NITRITE:2,LEUKOCYTESUR:2 in the last 72 hours Misc. Labs:   Micro: No results found for this or any previous visit (from the past 240 hour(s)).  Studies/Results: No results found.  Medications:  Scheduled:    . folic  acid  1 mg Oral Daily  . influenza  inactive virus vaccine  0.5 mL Intramuscular Tomorrow-1000  . LORazepam  0-4 mg Oral Q6H   Followed by  . LORazepam  0-4 mg Oral Q12H  . multivitamin with minerals  1 tablet Oral Daily  . neomycin-bacitracin-polymyxin   Topical TID  . pantoprazole  40 mg Oral Q1200  . pneumococcal 23 valent vaccine  0.5 mL Intramuscular Tomorrow-1000  . sodium chloride  3 mL Intravenous Q12H  . sodium chloride      . thiamine  100 mg Oral Daily   Or  . thiamine  100 mg Intravenous Daily   Continuous:  ZOX:[id number], acetaminophen, albuterol, HYDROmorphone (DILAUDID) injection, LORazepam, LORazepam, ondansetron (ZOFRAN) IV, ondansetron  Assessment: Principal Problem:  *Acute alcoholic pancreatitis Active Problems:  Rectal bleeding  Cocaine abuse  Anemia  Left adrenal mass  Suicidal ideation  Chronic blood loss anemia  Papules  Depression   1. Acute alcoholic pancreatitis. Resolved. His loose stools were likely the consequence of pancreatic insufficiency from acute pancreatitis and from reading after being without food for several days in the setting of alcohol and drug binging. His stools have solidified. Clinically, he does not have C. difficile colitis. 2. Substance abuse including tobacco, marijuana, alcohol, and cocaine. No signs of withdrawal syndrome. We'll continue Ativan withdrawal protocol and vitamin therapy. 3. Suicidal ideation/depression. The act team has evaluated the patient. I am in agreement that the patient needs inpatient therapy for both depression and substance abuse treatment. The patient is very receptive and eagerly wants help with treatment of both. 4. History of rectal bleeding with a positive family history for colon cancer. The patient has scheduled to followup with gastroenterology in a couple weeks. 5. Chronic blood loss anemia superimposed on dilutional anemia from IV fluids. His followup hemoglobin is stable. He should remain  on Protonix. empirically. 6. Left adrenal mass measuring 3.3 x 1.9 cm. Followup MRI recommended in the outpatient setting. 7. Elevated SGOT secondary to alcohol abuse. 8. Small erythematous pimple/papules on skin. Etiology is unknown. It appears to be focal folliculitis or possibly some type of insect bite. They are clinically improving on antibiotic ointment. Clinically, he does not have a systemic infection or cellulitis that would require oral/IV antibiotic therapy.    Plan: We are still awaiting an inpatient psychiatric bed for this patient who I believe needs inpatient treatment. The patient is clinically and hemodynamically stable. Clinically, he does not have C. difficile colitis or a MRSA cellulitis. His hemoglobin has remains stable.    LOS: 3 days   Jamonica Schoff 09/11/2012, 10:59 AM

## 2012-09-11 NOTE — Progress Notes (Signed)
After giving report to RN at [facility], [facility] notified me that pt cannot be transferred because they did not d/c, so room isn't available. They will call if something changes today. Sheryn Bison

## 2012-09-11 NOTE — Progress Notes (Signed)
Just received a phone call from the UC and Charge nurse on tonight at [facility] stating that they would like another hemocult stool checked, for patient to be free of loose stool for atleast 24 hours, and to examine the newly developed pimples on his abd and thigh. All this to ensure he does not have Cdiff or MRSA and will not be able to except care of this patient until these things are done so.

## 2012-09-11 NOTE — Progress Notes (Signed)
Report called to Isaac Laud, RN at [facility].

## 2012-09-11 NOTE — Tx Team (Signed)
Initial Interdisciplinary Treatment Plan  PATIENT STRENGTHS: (choose at least two) Ability for insight Active sense of humor Capable of independent living Communication skills General fund of knowledge Motivation for treatment/growth Supportive family/friends  PATIENT STRESSORS: Financial difficulties Health problems Substance abuse   PROBLEM LIST: Problem List/Patient Goals Date to be addressed Date deferred Reason deferred Estimated date of resolution     Polysubstance Abuse    09/11/12                                                      DISCHARGE CRITERIA:  Ability to meet basic life and health needs Adequate post-discharge living arrangements Improved stabilization in mood, thinking, and/or behavior Medical problems require only outpatient monitoring Motivation to continue treatment in a less acute level of care Need for constant or close observation no longer present Reduction of life-threatening or endangering symptoms to within safe limits Safe-care adequate arrangements made Verbal commitment to aftercare and medication compliance Withdrawal symptoms are absent or subacute and managed without 24-hour nursing intervention  PRELIMINARY DISCHARGE PLAN: Outpatient therapy Placement in alternative living arrangements  PATIENT/FAMIILY INVOLVEMENT: This treatment plan has been presented to and reviewed with the patient, Phillip Kemp, and/or family member.  The patient and family have been given the opportunity to ask questions and make suggestions.  Fransisca Kaufmann ANN 09/11/2012, 9:56 PM
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
## 2012-09-11 NOTE — Progress Notes (Signed)
Patient ID: Phillip Kemp, male   DOB: [birth year], 51 y.o.   MRN: [id number] Patient admitted voluntarily for help with ETOH and cocaine abuse. He reports that three months ago his other almost passed away after experiencing an MI. His mother is now in stable condition. Patient reports that this stressor caused him to begin drinking excessively. He describes drinking a case of beer and 2/5 bottle of wine per day. Uses cocaine mainly on the weekends. Currently denies any detox symptoms. Has been in the hospital for the last four days and has been receiving ativan. Patient reports becoming very frustrated with his substance abuse and had been experiencing tremors in the morning. Denies SI/HI/AVH during the admission. He reports having some thoughts of overdosing when feeling depressed about his substance abuse. Denies a history of suicide attempts. The patient is interested in gaining placement in long term treatment. He appears to be invested in his sobriety. Patient was very pleasant and cooperative during the admission process. He reports being at [facility] around six years ago. Patient was oriented to the 300 hall unit and routine. Patient is on Disability for a history of back pain for herniated disk per his report.

## 2012-09-12 DIAGNOSIS — F1410 Cocaine abuse, uncomplicated: Secondary | ICD-10-CM

## 2012-09-12 DIAGNOSIS — F39 Unspecified mood [affective] disorder: Secondary | ICD-10-CM

## 2012-09-12 MED ORDER — BISACODYL 5 MG PO TBEC
10.0000 mg | DELAYED_RELEASE_TABLET | Freq: Every day | ORAL | Status: DC
  Administered 2012-09-12 – 2012-09-16 (×4): 10 mg via ORAL
  Filled 2012-09-12 (×8): qty 2
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
## 2012-09-12 NOTE — Progress Notes (Signed)
  Phillip Kemp is a 51 y.o. male [id number] 1961-06-02  09/11/2012 Active Problems:  * No active hospital problems. *    Mental Status:  Mood is calm denies SI/HI/AVH.  Subjective/Objective: Admitted 9/25-9/27 for acute alcoholic pancreatitis. Has a Ladrenal mass but was cautioned to have SA treatment prior to being considered for surgery. Wants long term SA program and then will go to an [employer] receives [employer].   Filed Vitals:   09/11/12 2140  BP: 107/67  Pulse: 105  Temp:   Resp:     Lab Results:   BMET    Component Value Date/Time   NA 137 09/09/2012 0506   K 3.8 09/09/2012 0506   CL 107 09/09/2012 0506   CO2 24 09/09/2012 0506   GLUCOSE 103* 09/09/2012 0506   BUN 9 09/09/2012 0506   CREATININE 0.72 09/09/2012 0506   CALCIUM 9.6 09/09/2012 0506   GFRNONAA >90 09/09/2012 0506   GFRAA >90 09/09/2012 0506    Medications:  Scheduled:     . multivitamin with minerals  1 tablet Oral Daily  . thiamine  100 mg Oral Daily     PRN Meds acetaminophen, alum & mag hydroxide-simeth, chlordiazePOXIDE, hydrOXYzine, loperamide, magnesium hydroxide, ondansetron, traZODone  Plan: supportive care and try to find long term SA program tomorrow.   [name],MICKIE D. 09/12/2012

## 2012-09-12 NOTE — Progress Notes (Signed)
Patient ID: Phillip Kemp, male   DOB: 1961-03-09, 51 y.o.   MRN: [id number] Pt. attended and participated in aftercare planning group. Pt. accepted information on suicide prevention, warning signs to look for with suicide and crisis line numbers to use. The pt. agreed to call crisis line numbers if having warning signs or having thoughts of suicide. Pt stated that he would like to get into a long term tx program and he needs to "keep going."

## 2012-09-12 NOTE — Progress Notes (Signed)
D.  Pt pleasant and bright on approach.  Endorses on and off again passive SI.  Denies HI/hallucinations.  Interacting appropriately within milieu.  Positive for evening wrap up group.  Some anxiety and cravings related to withdrawal.  A.  Medication given as ordered.  Support and encouragement given.  R.  Pt remains safe, will continue to monitor.
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
## 2012-09-12 NOTE — Progress Notes (Signed)
Patient ID: Phillip Kemp, male   DOB: 11-01-1961, 51 y.o.   MRN: [id number]  [facility] Group Notes:  (Counselor/Nursing/MHT/Case Management/Adjunct)  09/12/2012 1:15 PM  Type of Therapy:  Group Therapy, Dance/Movement Therapy   Participation Level:  Active  Participation Quality:  Appropriate and Drowsy  Affect:  Appropriate  Cognitive:  Appropriate  Insight:  Limited  Engagement in Group:  Good  Engagement in Therapy:  Good  Modes of Intervention:  Clarification, Problem-solving, Role-play, Socialization and Support  Summary of Progress/Problems:  Pt attended counseling group regarding healthy supports, inspiration towards recovery.  The pt participated in two experiential activities one that was psychoeducational on the definition of substance abuse and one on awareness and recovery. Pt shared his experiences and feelings during group and actively participated in activities.  Pt was slightly drowsy during group but made efforts to stay awake and participate.    Debarah Crape 09/12/2012. 3:32 PM

## 2012-09-12 NOTE — Progress Notes (Signed)
[facility] Adult Inpatient Family/Significant Other Suicide Prevention Education  Suicide Prevention Education:  Patient Refusal for Family/Significant Other Suicide Prevention Education: The patient Phillip Kemp has refused to provide written consent for family/significant other to be provided Family/Significant Other Suicide Prevention Education during admission and/or prior to discharge.  Physician notified.  Pt. accepted information on suicide prevention, warning signs to look for with suicide and crisis line numbers to use. The pt. agreed to call crisis line numbers if having warning signs or having thoughts of suicide.    [facility] 09/12/2012, 4:37 PM

## 2012-09-12 NOTE — Progress Notes (Signed)
Patient ID: Phillip Kemp, male   DOB: March 20, 1961, 51 y.o.   MRN: [id number] D: Pt is awake and active on the unit this AM. Pt endorses passive SI but he is able to contract for safety. He states that he feels much better since he arrived. Pt denies HI and A/V hallucinations. Pt is participating in the milieu and is cooperative with staff. Pt rates their depression at 9 and hopelessness at 8. Pt's most recent CIWA score was 7. Pt mood is depressed this AM and his affect is sad but he is conversational. 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   A: Writer offered self, utilized therapeutic communication and administered medication per MD orders. Writer also encouraged pt to discuss feelings with staff and attend groups. Pt states that he knows he must stay clean this time since he is no good to anyone when he is high. Pt c/o pancreatitis with pain and relates this to his drinking as well.   R: Pt is attending groups and tolerating medications well. Writer will continue to monitor. 15 minute checks are ongoing for safety. Pt seems to be vested in tx and verbalizes the importance of sobriety to his health and overall well being.

## 2012-09-12 NOTE — Progress Notes (Signed)
Psychoeducational Group Note  Date:  09/12/2012 Time:  1015  Group Topic/Focus:  Crisis Planning:   The purpose of this group is to help patients create a crisis plan for use upon discharge or in the future, as needed.  Participation Level:  Minimal  Participation Quality:  Attentive  Affect:  Appropriate  Cognitive:  Appropriate  Insight:  Good  Engagement in Group:  Limited  Additional Comments:   Cresenciano Lick 09/12/2012, 12:25 PM

## 2012-09-12 NOTE — H&P (Signed)
Psychiatric Admission Assessment Adult  Patient Identification:  Phillip Kemp Date of Evaluation:  09/12/2012 Chief Complaint:  ETOH ABUSE COCAINE ABUSE History of Present Illness:: Phillip Kemp is a 27 you SAAM who decided over the past few weeks he was tired of drinking and using drugs. He starting having passing thoughts of hurting himself because he was so frustrated with his long term alchol and drug use. States there was not any one major event that occurred that led him to come for voluntary admission for detox other than he was tired of living the way he was. States he has children and grandchildren and that is reason to stop the drinking and drug use.  [name] denies to this FNP AH/VH/HI/ SI. State the SI has significantly reduced and he is feeling much better since he came to the ED a few days ago.  Mood Symptoms:  Depression, Depression Symptoms:  depressed mood, (Hypo) Manic Symptoms:   Anxiety Symptoms:  Excessive Worry, Psychotic Symptoms:    PTSD Symptoms:   Past Psychiatric History: Diagnosis: Etoh and Cocaine Abuse  Hospitalizations: this is 2nd psychiatric admission to [facility]  Outpatient Care: none  Substance Abuse Care: none  Self-Mutilation: denies  Suicidal Attempts:denies  Violent Behaviors: denies   Past Medical History:   Past Medical History  Diagnosis Date  . Arthritis   . Substance abuse     Cocaine, marijuana, and alcohol  . Suicidal ideation 09/09/2012  . Chronic blood loss anemia 09/09/2012  . Rectal bleeding 09/08/2012  . Acute alcoholic pancreatitis 09/08/2012   ROS: GI- mild nausea MSK-bulging disc HEENT- missing teeth Remaining systems negative None. Allergies:  No Known Allergies PTA Medications: Prescriptions prior to admission  Medication Sig Dispense Refill  . acetaminophen (TYLENOL ARTHRITIS PAIN) 650 MG CR tablet Take 650 mg by mouth every 8 (eight) hours as needed. Arthritis Pain       . folic acid (FOLVITE) 1 MG tablet Take 1  tablet (1 mg total) by mouth daily.      . Multiple Vitamin (MULTIVITAMIN PO) Take 1 tablet by mouth daily.        Marland Kitchen neomycin-bacitracin-polymyxin (NEOSPORIN) ointment Apply topically 3 (three) times daily. Apply to pimples on right thigh and skin on right abdomen for 5 more days.  15 g    . pantoprazole (PROTONIX) 40 MG tablet Take 1 tablet (40 mg total) by mouth daily.  30 tablet  3  . thiamine 100 MG tablet Take 1 tablet (100 mg total) by mouth daily.        Previous Psychotropic Medications:  Medication/Dose  none               Substance Abuse History in the last 12 months: Substance Age of 1st Use Last Use Amount Specific Type  Nicotine      Alcohol 9  5 days ago    Cannabis      Opiates      Cocaine [age] days ago    Methamphetamines      LSD      Ecstasy      Benzodiazepines      Caffeine      Inhalants      Others:                         Consequences of Substance Abuse: Withdrawal Symptoms:   Cramps Diarrhea Nausea  Social History: Current Place of Residence:  [place], [place]   Place of Birth:  [place] Family Members: Marital Status:  Single Children:  Sons: 1  Daughters: 1 Relationships: Education:  [employer] Insurance claims handler Problems/Performance: denies Religious Beliefs/Practices:[employer] History of Abuse (Emotional/Phsycial/Sexual) denies Armed forces technical officer; unemployed Hotel manager History:  None. Legal History: court date October 14 for traffic violation Hobbies/Interests: baseball  Family History:   Family History  Problem Relation Age of Onset  . Heart disease Mother   . Cancer Father     prostate    Mental Status Examination/Evaluation: Objective:  Appearance: Fairly Groomed  Patent attorney::  Fair  Speech:  Clear and Coherent  Volume:  Normal  Mood:  Depressed  Affect:  Congruent  Thought Process:  Coherent  Orientation:  Full  Thought Content:  WDL  Suicidal Thoughts:  No  Homicidal Thoughts:  No  Memory:   Immediate;   Good  Judgement:  Fair  Insight:  Fair  Psychomotor Activity:  Normal  Concentration:  Good  Recall:  Good  Akathisia:  No  Handed:  Left  AIMS (if indicated):     Assets:  Communication Skills Desire for Improvement  Sleep:   approx 5 hours    Laboratory/X-Ray Psychological Evaluation(s)      Assessment:    AXIS I:  Alcohol Abuse;cocain abuse, mood d/o nos AXIS II:  Deferred AXIS III:   Past Medical History  Diagnosis Date  . Arthritis   . Substance abuse     Cocaine, marijuana, and alcohol  . Suicidal ideation 09/09/2012  . Chronic blood loss anemia 09/09/2012  . Rectal bleeding 09/08/2012  . Acute alcoholic pancreatitis 09/08/2012   AXIS IV:  economic problems, educational problems and occupational problems AXIS V:  21-30 behavior considerably influenced by delusions or hallucinations OR serious impairment in judgment, communication OR inability to function in almost all areas  Treatment Plan/Recommendations: 1. Admit to [facility] 2. Detox etoh/opiates 3. Milieu treatment  Treatment Plan Summary: Daily contact with patient to assess and evaluate symptoms and progress in treatment Medication management Current Medications:  Current Facility-Administered Medications  Medication Dose Route Frequency Provider Last Rate Last Dose  . acetaminophen (TYLENOL) tablet 650 mg  650 mg Oral Q6H PRN Caroleen Hamman, NP      . alum & mag hydroxide-simeth (MAALOX/MYLANTA) 200-200-20 MG/5ML suspension 30 mL  30 mL Oral Q4H PRN Caroleen Hamman, NP      . chlordiazePOXIDE (LIBRIUM) capsule 25 mg  25 mg Oral Q6H PRN Caroleen Hamman, NP   25 mg at 09/11/12 2301  . hydrOXYzine (ATARAX/VISTARIL) tablet 25 mg  25 mg Oral Q6H PRN Caroleen Hamman, NP      . loperamide (IMODIUM) capsule 2-4 mg  2-4 mg Oral PRN Caroleen Hamman, NP      . magnesium hydroxide (MILK OF MAGNESIA) suspension 30 mL  30 mL Oral Daily PRN Caroleen Hamman, NP      . multivitamin with minerals tablet 1 tablet  1 tablet Oral  Daily Caroleen Hamman, NP      . ondansetron (ZOFRAN-ODT) disintegrating tablet 4 mg  4 mg Oral Q6H PRN Caroleen Hamman, NP      . thiamine (VITAMIN B-1) tablet 100 mg  100 mg Oral Daily Caroleen Hamman, NP      . traZODone (DESYREL) tablet 100 mg  100 mg Oral QHS PRN,MR X 1 Caroleen Hamman, NP   100 mg at 09/12/12 0049   Facility-Administered Medications Ordered in Other Encounters  Medication Dose Route Frequency Provider Last Rate Last Dose  . DISCONTD: acetaminophen (TYLENOL) suppository 650 mg  650 mg Rectal Q6H PRN Osvaldo Shipper, MD      . DISCONTD: acetaminophen (TYLENOL) tablet 650 mg  650 mg Oral Q6H PRN Osvaldo Shipper, MD      . DISCONTD: albuterol (PROVENTIL) (5 MG/ML) 0.5% nebulizer solution 2.5 mg  2.5 mg Nebulization Q2H PRN Osvaldo Shipper, MD      . DISCONTD: folic acid (FOLVITE) tablet 1 mg  1 mg Oral Daily Osvaldo Shipper, MD   1 mg at 09/11/12 1129  . DISCONTD: HYDROmorphone (DILAUDID) injection 0.5 mg  0.5 mg Intravenous Q4H PRN Osvaldo Shipper, MD   0.5 mg at 09/09/12 0403  . DISCONTD: LORazepam (ATIVAN) injection 1 mg  1 mg Intravenous Q6H PRN Osvaldo Shipper, MD   2 mg at 09/09/12 2355  . DISCONTD: LORazepam (ATIVAN) tablet 0-4 mg  0-4 mg Oral Q12H Osvaldo Shipper, MD   2 mg at 09/11/12 0103  . DISCONTD: LORazepam (ATIVAN) tablet 1 mg  1 mg Oral Q6H PRN Osvaldo Shipper, MD      . DISCONTD: multivitamin with minerals tablet 1 tablet  1 tablet Oral Daily Osvaldo Shipper, MD   1 tablet at 09/11/12 1129  . DISCONTD: neomycin-bacitracin-polymyxin (NEOSPORIN) ointment   Topical TID Elliot Cousin, MD      . DISCONTD: ondansetron [facility]) injection 4 mg  4 mg Intravenous Q6H PRN Osvaldo Shipper, MD      . DISCONTD: ondansetron (ZOFRAN) tablet 4 mg  4 mg Oral Q6H PRN Osvaldo Shipper, MD      . DISCONTD: pantoprazole (PROTONIX) EC tablet 40 mg  40 mg Oral Q1200 Elliot Cousin, MD   40 mg at 09/11/12 1135  . DISCONTD: sodium chloride 0.9 % injection 3 mL  3 mL Intravenous Q12H Osvaldo Shipper, MD   3  mL at 09/10/12 1000  . DISCONTD: thiamine (B-1) injection 100 mg  100 mg Intravenous Daily Osvaldo Shipper, MD      . DISCONTD: thiamine (VITAMIN B-1) tablet 100 mg  100 mg Oral Daily Osvaldo Shipper, MD   100 mg at 09/11/12 1130    Observation Level/Precautions:  Detox  Laboratory:    Psychotherapy:    Medications:    Routine PRN Medications:  Yes  Consultations:    Discharge Concerns:  Long term etoh and cocaine use  Other:     Caroleen Hamman 9/29/20131:08 AM

## 2012-09-13 DIAGNOSIS — F1010 Alcohol abuse, uncomplicated: Principal | ICD-10-CM

## 2012-09-13 DIAGNOSIS — F1994 Other psychoactive substance use, unspecified with psychoactive substance-induced mood disorder: Secondary | ICD-10-CM

## 2012-09-13 MED ORDER — VITAMIN B-1 100 MG PO TABS
100.0000 mg | ORAL_TABLET | Freq: Every day | ORAL | Status: DC
  Administered 2012-09-15 – 2012-09-17 (×3): 100 mg via ORAL
  Filled 2012-09-13 (×6): qty 1

## 2012-09-13 MED ORDER — FOLIC ACID 1 MG PO TABS
1.0000 mg | ORAL_TABLET | Freq: Every day | ORAL | Status: DC
  Administered 2012-09-14 – 2012-09-17 (×4): 1 mg via ORAL
  Filled 2012-09-13 (×6): qty 1

## 2012-09-13 MED ORDER — CITALOPRAM HYDROBROMIDE 20 MG PO TABS
20.0000 mg | ORAL_TABLET | Freq: Every day | ORAL | Status: DC
  Administered 2012-09-14 – 2012-09-17 (×4): 20 mg via ORAL
  Filled 2012-09-13 (×3): qty 1
  Filled 2012-09-13: qty 14
  Filled 2012-09-13 (×3): qty 1

## 2012-09-13 MED ORDER — PANTOPRAZOLE SODIUM 40 MG PO TBEC
40.0000 mg | DELAYED_RELEASE_TABLET | Freq: Every day | ORAL | Status: DC
  Administered 2012-09-14 – 2012-09-17 (×4): 40 mg via ORAL
  Filled 2012-09-13 (×5): qty 1
  Filled 2012-09-13: qty 14
  Filled 2012-09-13: qty 1

## 2012-09-13 NOTE — Progress Notes (Signed)
[facility] Group Notes:  (Counselor/Nursing/MHT/Case Management/Adjunct) 09/12/2012 2100  Type of Therapy:  wrap up group  Participation Level:  Active  Participation Quality:  Appropriate, Attentive and Sharing  Affect:  Appropriate  Cognitive:  Appropriate  Insight:  Limited  Engagement in Group:  Good  Engagement in Therapy:  Good  Modes of Intervention:  Clarification, Education and Support  Summary of Progress/Problems: Pt reported that today he woke up sober and it felt great!  Pt is grateful to be alive and is grateful for his 2 kids, supportive wife, and mother. Pt expressed some concern over the counselors not disclosing whether or not they had a history of addiction.  This Clinical research associate explained that the counselors are encouraged not to disclose such information especially in a group session to uphold professional boundaries.   Shelah Lewandowsky 09/13/2012, 3:00 AM

## 2012-09-13 NOTE — Progress Notes (Signed)
Patient ID: Phillip Kemp, male   DOB: 1960-12-23, 51 y.o.   MRN: [id number] He has been up and to groups interacting with peers and staff. Self inventory: Appetite poor, energy low,  Depressed and Hopeless at 10, Has SI thoughts on and off says that when he dreams at night he dreams of hurting himself. Has physical problems of light headache, dizziness and blurred vision. He said that his wife had died 7 years ago in 2023/09/19 and he always gets depressed.  Also that he was not going to return to his sisters because of the substance abuse there.  Also spoke of being tease d/t  The way his arms are. Also c/o pain in chest and of pancretitis.  Also c/o weakness d/t his chronic anemia N/P is aware.

## 2012-09-13 NOTE — Progress Notes (Signed)
Reviewed the progress note and agree with the treatment plan. 

## 2012-09-13 NOTE — Progress Notes (Signed)
[facility] In Patient Progress Note 09/13/2012 9:47 PM MACIO KISSOON 10/16/1961 [id number] Hospital day #:2 Diagnosis:  Assessment:  AXIS I: Alcohol Abuse;cocain abuse,SIMDO  AXIS II: Deferred  AXIS III:  Past Medical History   Diagnosis  Date   .  Arthritis    .  Substance abuse      Cocaine, marijuana, and alcohol   .  Suicidal ideation  09/09/2012   .  Chronic blood loss anemia  09/09/2012   .  Rectal bleeding  09/08/2012   .  Acute alcoholic pancreatitis  09/08/2012    AXIS IV: economic problems, educational problems and occupational problems  AXIS V: 21-30 behavior considerably influenced by delusions or hallucinations OR serious impairment in judgment, communication OR inability to function in almost all areas  4 ADL's:  Intact Sleep:  Tossed and turned Appetite: pretty good  Groups:  Fair attendence  Subjective: patient notes that he feels tired and fatigued. Thinks he may have been diagnosed with anemia and that's why he is fatigued.  He notes that he continues to have headaches, related to his cataracts, and feels easily winded.   height is 6' 0.99" (1.854 m) and weight is 62.596 kg (138 lb). His oral temperature is 97.8 F (36.6 C). His blood pressure is 111/70 and his pulse is 90. His respiration is 16.   Objective:  He denies any further symptoms of withdrawal.  Ros: Constitutional: WD WN Adult in NAD ENT:      negative for runny nose, sore throat, congestion, dysphagia COR:     negative for cough, + SOB, chest pain, wheezing GI:         negative for Nausea, vomiting, diarrhea, constipation, abdominal pain Neuro:  negative for dizziness, blurred vision, headaches, numbness or tingling Ortho:   negative for limb pain, swelling, change in ambulatory status.  Mental Status Exam Level of Consciousness: awake Orientation: x 3 General Appearance:  casual Behavior:  cooperative Eye Contact:  good Motor Behavior:  normal Speech:  clear Mood:  severely  depressed  Suicidal Ideation: No suicidal ideation, no plan, no intent, no means. Homicidal Ideation:  No homicidal ideation, no plan, no intent, no means.  Affect:  congruent Anxiety Level: severe Thought Process:  linear Thought Content:  No AVH Perception:  intact Judgment:  fair Insight:   limited Cognition:  average Sleep:  Number of Hours: 5.75   Lab Results: No results found for this or any previous visit (from the past 48 hour(s)). Labs are reviewed. Physical Findings: AIMS: CIWA-Ar Total: 4  CIWA:  CIWA-Ar Total: 4  COWS:    Medication:  . bisacodyl  10 mg Oral QHS  . multivitamin with minerals  1 tablet Oral Daily  . thiamine  100 mg Oral Daily   Treatment Plan Summary: 1. Admit for crisis management and stabilization. 2. Medication management to reduce current symptoms to base line and improve the patient's overall level of functioning 3. Treat health problems as indicated. 4. Develop treatment plan to decrease risk of relapse upon discharge and the need for readmission. 5. Psycho-social education regarding relapse prevention and self care. 6. Health care follow up as needed for medical problems. 7. Will start celexa 20 mg po qd for depression. 8.  Labs are reviewed and home medications are re ordered. 9. Pt. Hopes to discharge to [facility] residential and if no bed available he wants to go to [facility]. Rona Ravens. Chia Rock PAC 09/13/2012, 9:47 PM

## 2012-09-13 NOTE — Progress Notes (Signed)
Pt attended discharge planning group and actively participated in group.  SW provided pt with today's workbook.  Pt presents with flat affect and depressed mood.  Pt rates depression and anxiety at a 9-10 today.  Pt denies SI.  Pt states that he came to the hospital for alcohol, cocaine and depression.  Pt states that he has been here 4 years ago.  Pt states that he wants long term treatment after d/c from here.  Pt states that he lives in [place] with his mom.  SW will assess for appropriate referrals.  No further needs voiced by pt at this time.  Safety planning and suicide prevention discussed.  Pt participated in discussion and acknowledged an understanding of the information provided.       Reyes Ivan, LCSWA 09/13/2012  10:21 AM

## 2012-09-13 NOTE — Progress Notes (Signed)
Psychoeducational Group Note  Date:  09/13/2012 Time:  1000  Group Topic/Focus:  Recovery Goals:   The focus of this group is to identify appropriate goals for recovery and establish a plan to achieve them.  Participation Level:  Active  Participation Quality:  Appropriate, Sharing and Supportive  Affect:  Appropriate  Cognitive:  Alert and Appropriate  Insight:  Good  Engagement in Group:  Good  Additional Comments:  None   Phillip Kemp 09/13/2012, 5:07 PM

## 2012-09-13 NOTE — Progress Notes (Signed)
Psychoeducational Group Note  Date:  09/13/2012 Time:  1100  Group Topic/Focus:  Self Care:   The focus of this group is to help patients understand the importance of self-care in order to improve or restore emotional, physical, spiritual, interpersonal, and financial health.  Participation Level:  Active  Participation Quality:  Appropriate, Sharing and Supportive  Affect:  Appropriate  Cognitive:  Appropriate  Insight:  Good  Engagement in Group:  Good  Additional Comments:  none  Erryn Dickison M 09/13/2012, 1:40 PM

## 2012-09-14 NOTE — Progress Notes (Signed)
Patient ID: Phillip Kemp, male   DOB: 08-23-1961, 51 y.o.   MRN: [id number] D: Pt. Reports "I got something for anxiety earlier and went to bed." "feel good." Writer notes tremors at finger tips.  A: staff will monitor q49min for safety. Writer gave med for WD symptoms(see MAR) R: Pt. Is safe on the unit. Pt. Denies any other problems.

## 2012-09-14 NOTE — Tx Team (Signed)
Interdisciplinary Treatment Plan Update (Adult)  Date:  09/14/2012  Time Reviewed:  10:18 AM   Progress in Treatment: Attending groups: Yes Participating in groups:  Yes Taking medication as prescribed: Yes Tolerating medication:  Yes Family/Significant othe contact made:  Counselor assessing for appropriate contact Patient understands diagnosis:  Yes Discussing patient identified problems/goals with staff:  Yes Medical problems stabilized or resolved:  Yes Denies suicidal/homicidal ideation: Yes Issues/concerns per patient self-inventory:  None identified Other: N/A  New problem(s) identified: None Identified  Reason for Continuation of Hospitalization: Anxiety Depression Medication stabilization Withdrawal symptoms  Interventions implemented related to continuation of hospitalization: mood stabilization, medication monitoring and adjustment, group therapy and psycho education, safety checks q 15 mins  Additional comments: N/A  Estimated length of stay: 3-5 days  Discharge Plan: Pt is scheduled to go to [facility] on 10/10 for further treatment.    New goal(s): N/A  Review of initial/current patient goals per problem list:    1.  Goal(s): Address substance use  Met:  No  Target date: by discharge  As evidenced by: completing detox protocol and refer to appropriate treatment  2.  Goal (s): Reduce depressive and anxiety symptoms  Met:  No  Target date: by discharge  As evidenced by: Reducing depression from a 10 to a 3 as reported by pt.    3.  Goal(s): Eliminate SI  Met:  No  Target date: by discharge  As evidenced by: Pt denying SI   Attendees: Patient:  Phillip Kemp  09/14/2012 10:18 AM   Family:     Physician: Patrick North, MD 09/14/2012 10:18 AM   Nursing: Roswell Miners, RN 09/14/2012 10:18 AM   Case Manager:  Reyes Ivan, LCSWA 09/14/2012  10:18 AM   Counselor:  Ronda Fairly, LCSWA 09/14/2012  10:18 AM   Other:  Leighton Parody,  RN 09/14/2012 10:18 AM   Other:     Other:     Other:      Scribe for Treatment Team:   Reyes Ivan 09/14/2012 10:18 AM

## 2012-09-14 NOTE — Progress Notes (Signed)
[facility] Group Notes:  (Counselor/Nursing/MHT/Case Management/Adjunct)   Type of Therapy:  Group Therapy at 1:15  Participation Level:  Active  Participation Quality:  Attentive  Affect:  Appropriate  Cognitive:  Alert and Oriented  Engagement in Group:  Good  Engagement in Therapy:  Good  Modes of Intervention:  Education, Socialization and Support  Summary of Progress/Problems: Patient attended group presentation by staff member of  [employer] [facility] [employer] ([facility]). Phillip Kemp was attentive and appropriate during session.      Clide Dales 09/14/2012, 4:25 PM

## 2012-09-14 NOTE — Progress Notes (Signed)
[facility] MD Progress Note  09/14/2012 10:23 AM  S: Patient reports he is feeling better today, decreased abdominal pain. Poor sleep. Feeling weak, mood still depressed. Tolerating Celexa well, denies any side effects. Diagnosis:   Axis I: Polysubstance Abuse (Alcohol, cocaine) Axis II: No diagnosis Axis III:  Past Medical History  Diagnosis Date  . Arthritis   . Substance abuse     Cocaine, marijuana, and alcohol  . Suicidal ideation 09/09/2012  . Chronic blood loss anemia 09/09/2012  . Rectal bleeding 09/08/2012  . Acute alcoholic pancreatitis 09/08/2012   Axis IV: other psychosocial or environmental problems Axis V: 51-60 moderate symptoms  ADL's:  Intact  Sleep: Poor  Appetite:  Fair   Mental Status Examination/Evaluation: Objective:  Appearance: Disheveled  Eye Contact::  Fair  Speech:  Normal Rate  Volume:  Normal  Mood:  Dysphoric  Affect:  Constricted  Thought Process:  Intact  Orientation:  Full  Thought Content:  WDL  Suicidal Thoughts:  No  Homicidal Thoughts:  No  Memory:  Immediate;   Fair Recent;   Fair Remote;   Fair  Judgement:  Poor  Insight:  Shallow  Psychomotor Activity:  Normal  Concentration:  Fair  Recall:  Fair  Akathisia:  No  Handed:  Right  AIMS (if indicated):     Assets:  Communication Skills  Sleep:  Number of Hours: 6    Vital Signs:Blood pressure 102/69, pulse 94, temperature 97.8 F (36.6 C), temperature source Oral, resp. rate 16, height 6' 0.99" (1.854 m), weight 62.596 kg (138 lb). Current Medications: Current Facility-Administered Medications  Medication Dose Route Frequency Provider Last Rate Last Dose  . acetaminophen (TYLENOL) tablet 650 mg  650 mg Oral Q6H PRN Caroleen Hamman, NP      . alum & mag hydroxide-simeth (MAALOX/MYLANTA) 200-200-20 MG/5ML suspension 30 mL  30 mL Oral Q4H PRN Caroleen Hamman, NP      . bisacodyl (DULCOLAX) EC tablet 10 mg  10 mg Oral QHS Verne Spurr, PA-C   10 mg at 09/12/12 2223  . chlordiazePOXIDE  (LIBRIUM) capsule 25 mg  25 mg Oral Q6H PRN Caroleen Hamman, NP   25 mg at 09/13/12 2251  . citalopram (CELEXA) tablet 20 mg  20 mg Oral Daily Verne Spurr, PA-C   20 mg at 09/14/12 0749  . folic acid (FOLVITE) tablet 1 mg  1 mg Oral Daily Verne Spurr, PA-C   1 mg at 09/14/12 0750  . hydrOXYzine (ATARAX/VISTARIL) tablet 25 mg  25 mg Oral Q6H PRN Caroleen Hamman, NP   25 mg at 09/13/12 1719  . loperamide (IMODIUM) capsule 2-4 mg  2-4 mg Oral PRN Caroleen Hamman, NP      . magnesium hydroxide (MILK OF MAGNESIA) suspension 30 mL  30 mL Oral Daily PRN Caroleen Hamman, NP      . multivitamin with minerals tablet 1 tablet  1 tablet Oral Daily Caroleen Hamman, NP   1 tablet at 09/14/12 0749  . ondansetron (ZOFRAN-ODT) disintegrating tablet 4 mg  4 mg Oral Q6H PRN Caroleen Hamman, NP      . pantoprazole (PROTONIX) EC tablet 40 mg  40 mg Oral Daily Verne Spurr, PA-C   40 mg at 09/14/12 0749  . thiamine (VITAMIN B-1) tablet 100 mg  100 mg Oral Daily Caroleen Hamman, NP   100 mg at 09/14/12 0749  . thiamine (VITAMIN B-1) tablet 100 mg  100 mg Oral Daily Verne Spurr, PA-C      . traZODone (DESYREL) tablet  100 mg  100 mg Oral QHS PRN,MR X 1 Caroleen Hamman, NP   100 mg at 09/13/12 2250    Lab Results: No results found for this or any previous visit (from the past 48 hour(s)).  Physical Findings: AIMS: Facial and Oral Movements Muscles of Facial Expression: None, normal Lips and Perioral Area: None, normal Jaw: None, normal Tongue: None, normal,Extremity Movements Upper (arms, wrists, hands, fingers): None, normal Lower (legs, knees, ankles, toes): None, normal, Trunk Movements Neck, shoulders, hips: None, normal, Overall Severity Severity of abnormal movements (highest score from questions above): None, normal Incapacitation due to abnormal movements: None, normal Patient's awareness of abnormal movements (rate only patient's report): No Awareness, Dental Status Current problems with teeth and/or dentures?:  No Does patient usually wear dentures?: No  CIWA:  CIWA-Ar Total: 1  COWS:     Treatment Plan Summary: Daily contact with patient to assess and evaluate symptoms and progress in treatment Medication management  Plan: Continue current plan. Monitor for weakness.  Yi Falletta 09/14/2012, 10:23 AM

## 2012-09-14 NOTE — Progress Notes (Signed)
Patient ID: Phillip Kemp, male   DOB: 02-26-1961, 51 y.o.   MRN: [id number] He has been up and about and to groups, interacting with peers and staff.  He c/o weakness this AM Dr. Elvera Lennox. Was made aware and she said this was a chronic condition and his PCP can address it after d/c. He was given Gatorade  And after he said that he was less weak. Self inventory: depressed and hopeless at 7. Has SI thoughts on and off. Contracts for safety here.

## 2012-09-14 NOTE — Progress Notes (Signed)
Pt attended discharge planning group and actively participated in group.  SW provided pt with today's workbook.  Pt presents with flat affect and depressed mood.  Pt rates depression and anxiety at a 7 today.  Pt denies SI.  Pt is scheduled to go to [facility] on 10/10.  Pt is working on seeking a safe place to stay until he can go to [facility] next week.  No further needs voiced by pt at this time.    Reyes Ivan, LCSWA 09/14/2012  10:24 AM

## 2012-09-15 MED ORDER — LORATADINE 10 MG PO TABS
10.0000 mg | ORAL_TABLET | Freq: Every day | ORAL | Status: DC
  Administered 2012-09-15 – 2012-09-17 (×3): 10 mg via ORAL
  Filled 2012-09-15 (×5): qty 1

## 2012-09-15 NOTE — Progress Notes (Signed)
Pt resting in bed with eyes closed.  No distress observed.  Safety maintained with q15 minute checks. 

## 2012-09-15 NOTE — Progress Notes (Signed)
[facility] MD Progress Note  09/15/2012 11:13 AM S: Patient complaining of congestion and feeling weak.   Diagnosis:   Axis I: Alcohol Abuse and Depressive Disorder NOS Axis II: No diagnosis Axis III:  Past Medical History  Diagnosis Date  . Arthritis   . Substance abuse     Cocaine, marijuana, and alcohol  . Suicidal ideation 09/09/2012  . Chronic blood loss anemia 09/09/2012  . Rectal bleeding 09/08/2012  . Acute alcoholic pancreatitis 09/08/2012   Axis IV: economic problems and housing problems Axis V: 51-60 moderate symptoms  ADL's:  Intact  Sleep: Poor  Appetite:  Fair    Mental Status Examination/Evaluation: Objective:  Appearance: Disheveled  Eye Contact::  Fair  Speech:  Clear and Coherent  Volume:  Normal  Mood:  Dysphoric  Affect:  Constricted  Thought Process:  Coherent  Orientation:  Full  Thought Content:  WDL  Suicidal Thoughts:  No  Homicidal Thoughts:  No  Memory:  Immediate;   Fair Recent;   Fair Remote;   Fair  Judgement:  Fair  Insight:  Shallow  Psychomotor Activity:  Normal  Concentration:  Fair  Recall:  Fair  Akathisia:  No  Handed:  Right  AIMS (if indicated):     Assets:  Desire for Improvement  Sleep:  Number of Hours: 6.5    Vital Signs:Blood pressure 94/64, pulse 120, temperature 98.2 F (36.8 C), temperature source Oral, resp. rate 16, height 6' 0.99" (1.854 m), weight 62.596 kg (138 lb). Current Medications: Current Facility-Administered Medications  Medication Dose Route Frequency Provider Last Rate Last Dose  . acetaminophen (TYLENOL) tablet 650 mg  650 mg Oral Q6H PRN Caroleen Hamman, NP      . alum & mag hydroxide-simeth (MAALOX/MYLANTA) 200-200-20 MG/5ML suspension 30 mL  30 mL Oral Q4H PRN Caroleen Hamman, NP      . bisacodyl (DULCOLAX) EC tablet 10 mg  10 mg Oral QHS Verne Spurr, PA-C   10 mg at 09/14/12 2157  . chlordiazePOXIDE (LIBRIUM) capsule 25 mg  25 mg Oral Q6H PRN Caroleen Hamman, NP   25 mg at 09/13/12 2251  . citalopram  (CELEXA) tablet 20 mg  20 mg Oral Daily Verne Spurr, PA-C   20 mg at 09/15/12 0842  . folic acid (FOLVITE) tablet 1 mg  1 mg Oral Daily Verne Spurr, PA-C   1 mg at 09/15/12 1610  . hydrOXYzine (ATARAX/VISTARIL) tablet 25 mg  25 mg Oral Q6H PRN Caroleen Hamman, NP   25 mg at 09/13/12 1719  . loperamide (IMODIUM) capsule 2-4 mg  2-4 mg Oral PRN Caroleen Hamman, NP      . magnesium hydroxide (MILK OF MAGNESIA) suspension 30 mL  30 mL Oral Daily PRN Caroleen Hamman, NP      . multivitamin with minerals tablet 1 tablet  1 tablet Oral Daily Caroleen Hamman, NP   1 tablet at 09/15/12 0842  . ondansetron (ZOFRAN-ODT) disintegrating tablet 4 mg  4 mg Oral Q6H PRN Caroleen Hamman, NP      . pantoprazole (PROTONIX) EC tablet 40 mg  40 mg Oral Daily Verne Spurr, PA-C   40 mg at 09/15/12 0842  . thiamine (VITAMIN B-1) tablet 100 mg  100 mg Oral Daily Verne Spurr, PA-C   100 mg at 09/15/12 9604  . traZODone (DESYREL) tablet 100 mg  100 mg Oral QHS PRN,MR X 1 Caroleen Hamman, NP   100 mg at 09/14/12 2157  . DISCONTD: thiamine (VITAMIN B-1) tablet 100 mg  100  mg Oral Daily Caroleen Hamman, NP   100 mg at 09/14/12 1610    Lab Results: No results found for this or any previous visit (from the past 48 hour(s)).  Physical Findings: AIMS: Facial and Oral Movements Muscles of Facial Expression: None, normal Lips and Perioral Area: None, normal Jaw: None, normal Tongue: None, normal,Extremity Movements Upper (arms, wrists, hands, fingers): None, normal Lower (legs, knees, ankles, toes): None, normal, Trunk Movements Neck, shoulders, hips: None, normal, Overall Severity Severity of abnormal movements (highest score from questions above): None, normal Incapacitation due to abnormal movements: None, normal Patient's awareness of abnormal movements (rate only patient's report): No Awareness, Dental Status Current problems with teeth and/or dentures?: No Does patient usually wear dentures?: No  CIWA:  CIWA-Ar Total: 10    COWS:     Treatment Plan Summary: Daily contact with patient to assess and evaluate symptoms and progress in treatment Medication management  Plan: Continue current plan of care. Continue detox protocol. Start Claritin 10mg  to help with congestion.  Nadiya Pieratt 09/15/2012, 11:13 AM

## 2012-09-15 NOTE — Discharge Planning (Signed)
Pt was seen this morning during discharge planning group.  Pt stated that he feels sick, no appetite, and he did not sleep well last night.  Pt admitted to the hospital due to alcohol detox and stated that he is experiencing withdrawal symptoms such as cold sweats. Pt rates his anxiety level at a eight, depression level at a zero, he denies SI/HI, yet he did endorse hallucinations.  Pt stated that he moved out of his apartment before entering the hospital and plans to live in a hotel upon discharge.  Pt does have transportation and assess to medications. CM will provide pt with a list of [facility] as requested by the pt.

## 2012-09-15 NOTE — Progress Notes (Signed)
Phillip Kemp is out on unit interacting with peers and attending groups.  Rates depression and hopelessness at 0. Denies SI. C/O congestin which was reported to provider in treatment team. Reports low energy and poor appetite. A- New medication education done. Encouraged snacks and high calorie meals.  Current POC continued with evaluation of treatment goals.  Support and encouragement offered.  R- Compliant with treatment plan. Denies SI. Remains safe on the unit.

## 2012-09-15 NOTE — Progress Notes (Signed)
Pt observed in the dayroom watching TV and interacting with peers.  Pt reports he is feeling better since taking Claritin for his congestion. He doesn't feel as weak as he did this morning.  He says his W/D symptoms are decreasing.  He attended NA this evening.  He is unsure of his discharge plans.  He is pleasant/cooperative on the unit.  He makes his needs known to staff.  Pt voices no needs/concerns at this time.  He requests a sleep med at [facility] which will be given.  Safety maintained with q15 minute checks.

## 2012-09-15 NOTE — Progress Notes (Signed)
[facility] Group Notes:  (Counselor/Nursing/MHT/Case Management/Adjunct)   Type of Therapy:  Group Therapy at 1:15 to 2:30 PM   Participation Level:  Active  Participation Quality:  Appropriate  Affect:  Appropriate, yet appeared tired  Cognitive:  Alert and Oriented  Insight:  Good  Engagement in Group:  Good  Engagement in Therapy:  Good  Modes of Intervention:  Clarification, Socialization and Support  Summary of Progress/Problems: The focus of this group session was to process how we deal with difficult emotions and share with others the patterns that play out when we are reacting to the emotion verses the situation. Phillip Kemp was able to process how he sometimes can be overwhelmed by emotions, especially when he has no one to talk things out with. Phillip Kemp then shared with group how when he stayed in contact with his sponsor and other supports in recovery group his emotions did not seem to be such a trigger to use.   Phillip Kemp 09/15/2012, 7:07 PM

## 2012-09-15 NOTE — Progress Notes (Signed)
Psychoeducational Group Note  Date:  09/15/2012 Time:  1100  Group Topic/Focus:  Crisis Planning:   The purpose of this group is to help patients create a crisis plan for use upon discharge or in the future, as needed.  Participation Level:  Active  Participation Quality:  Appropriate, Sharing and Supportive  Affect:  Appropriate  Cognitive:  Appropriate  Insight:  Good  Engagement in Group:  Good  Additional Comments:  none  Atticus Wedin M 09/15/2012, 1:57 PM

## 2012-09-16 MED ORDER — PSEUDOEPHEDRINE HCL ER 120 MG PO TB12
120.0000 mg | ORAL_TABLET | Freq: Two times a day (BID) | ORAL | Status: DC
  Administered 2012-09-16 – 2012-09-17 (×3): 120 mg via ORAL
  Filled 2012-09-16 (×2): qty 1
  Filled 2012-09-16: qty 6
  Filled 2012-09-16: qty 1
  Filled 2012-09-16: qty 6
  Filled 2012-09-16 (×3): qty 1

## 2012-09-16 MED ORDER — FERROUS SULFATE 325 (65 FE) MG PO TABS
325.0000 mg | ORAL_TABLET | Freq: Two times a day (BID) | ORAL | Status: DC
  Administered 2012-09-16 – 2012-09-17 (×2): 325 mg via ORAL
  Filled 2012-09-16 (×2): qty 1
  Filled 2012-09-16 (×2): qty 28
  Filled 2012-09-16 (×2): qty 1

## 2012-09-16 NOTE — Progress Notes (Signed)
D: Patient is passive SI and he contracts for safety and denies HI and A/V hallucinations; patient reports he needed medication to help with sleep; reports appetite to be poor ; reports energy level is low ; reports ability to pay attention to improving; rates depression as 8/10; rates hopelessness 8/10; rates anxiety as 0/10; patient complains of congestion and stuffiness and provider is aware  A: Monitored q 15 minutes; patient encouraged to attend groups; patient educated about medications; patient given medications per physician orders; patient encouraged to express feelings and/or concerns;  R: Patient is cooperative and appears to be anxious about discharge ; patient's interaction with staff and peers is appropriate; patient was able to set goal to talk with staff 1:1 when having feelings of SI; patient is taking medications as prescribed and tolerating medications; patient is attending all groups

## 2012-09-16 NOTE — Progress Notes (Signed)
Phillip Kemp [facility] MD Progress Note  09/16/2012 9:45 AM  S: "My mood is good if it has not been my stuffy nose. My nose is about to burst open. I am coughing too, but not bringing up anything. I am lying here remembering that today is pay day.  And I am craving for drugs right now. I know I'm addicted to drugs. If I had not been in here right now, I would be out there using. I was cleaned from drugs for 18 months at a time. Then, I relapsed. I would want and like to be clean again. I would like to go [facility] this time to continue treatment after this hospitalization, then apply to go the [facility]. [facility] has a very strict rule about drugs. While living there, you have to attend the AA/NA meetings and stay off of drugs".  O:Negative for fever.  HENT: Positive for congestion non-productive cough and rhinorrhea.  Respiratory: Negative for chest tightness and shortness of breath.  Cardiovascular: Negative for chest pain.  Gastrointestinal: Negative for nausea, vomiting and abdominal pain.  Skin: Negative for rash.  Neurological: Negative for weakness and headaches   Diagnosis:   Axis I: Polysubstance abuse/dependency Axis II: Deferred Axis III:  Past Medical History  Diagnosis Date  . Arthritis   . Substance abuse     Cocaine, marijuana, and alcohol  . Suicidal ideation 09/09/2012  . Chronic blood loss anemia 09/09/2012  . Rectal bleeding 09/08/2012  . Acute alcoholic pancreatitis 09/08/2012   Axis IV: other psychosocial or environmental problems Axis V: 41-50 serious symptoms  ADL's:  Intact  Sleep: Fair  Appetite:  Good  Suicidal Ideation: "No" Plan:  No Intent:  No Means:  no Homicidal Ideation: "No" Plan:  No Intent:  No Means:  no  AEB (as evidenced by): Per patient's reports  Mental Status Examination/Evaluation: Objective:  Appearance: Casual and thin  Eye Contact::  Good  Speech:  Clear and Coherent  Volume:  Normal  Mood:  "My mood is getting better , if not this my  stuffy nose".  Affect:  Appropriate  Thought Process:  Coherent and Intact  Orientation:  Full  Thought Content:  Rumination  Suicidal Thoughts:  No  Homicidal Thoughts:  No  Memory:  Immediate;   Good Recent;   Good Remote;   Good  Judgement:  Fair  Insight:  Fair  Psychomotor Activity:  Normal  Concentration:  Good  Recall:  Good  Akathisia:  No  Handed:  Right  AIMS (if indicated):     Assets:  Desire for Improvement  Sleep:  Number of Hours: 5.75    Vital Signs:Blood pressure 94/64, pulse 120, temperature 98.2 F (36.8 C), temperature source Oral, resp. rate 16, height 6' 0.99" (1.854 m), weight 62.596 kg (138 lb). Current Medications: Current Facility-Administered Medications  Medication Dose Route Frequency Provider Last Rate Last Dose  . acetaminophen (TYLENOL) tablet 650 mg  650 mg Oral Q6H PRN Caroleen Hamman, NP   650 mg at 09/15/12 1519  . alum & mag hydroxide-simeth (MAALOX/MYLANTA) 200-200-20 MG/5ML suspension 30 mL  30 mL Oral Q4H PRN Caroleen Hamman, NP      . bisacodyl (DULCOLAX) EC tablet 10 mg  10 mg Oral QHS Verne Spurr, PA-C   10 mg at 09/15/12 2211  . citalopram (CELEXA) tablet 20 mg  20 mg Oral Daily Verne Spurr, PA-C   20 mg at 09/16/12 0820  . ferrous sulfate tablet 325 mg  325 mg Oral BID WC  Sanjuana Kava, NP      . folic acid (FOLVITE) tablet 1 mg  1 mg Oral Daily Verne Spurr, PA-C   1 mg at 09/16/12 0820  . loratadine (CLARITIN) tablet 10 mg  10 mg Oral Daily Alnita Aybar, MD   10 mg at 09/16/12 0823  . magnesium hydroxide (MILK OF MAGNESIA) suspension 30 mL  30 mL Oral Daily PRN Caroleen Hamman, NP      . multivitamin with minerals tablet 1 tablet  1 tablet Oral Daily Caroleen Hamman, NP   1 tablet at 09/16/12 0820  . pantoprazole (PROTONIX) EC tablet 40 mg  40 mg Oral Daily Verne Spurr, PA-C   40 mg at 09/16/12 0820  . thiamine (VITAMIN B-1) tablet 100 mg  100 mg Oral Daily Verne Spurr, PA-C   100 mg at 09/16/12 1610  . traZODone (DESYREL) tablet  100 mg  100 mg Oral QHS PRN,MR X 1 Caroleen Hamman, NP   100 mg at 09/15/12 2331    Lab Results: No results found for this or any previous visit (from the past 48 hour(s)).  Physical Findings: AIMS: Facial and Oral Movements Muscles of Facial Expression: None, normal Lips and Perioral Area: None, normal Jaw: None, normal Tongue: None, normal,Extremity Movements Upper (arms, wrists, hands, fingers): None, normal Lower (legs, knees, ankles, toes): None, normal, Trunk Movements Neck, shoulders, hips: None, normal, Overall Severity Severity of abnormal movements (highest score from questions above): None, normal Incapacitation due to abnormal movements: None, normal Patient's awareness of abnormal movements (rate only patient's report): No Awareness, Dental Status Current problems with teeth and/or dentures?: No Does patient usually wear dentures?: No  CIWA:  CIWA-Ar Total: 4  COWS:     Treatment Plan Summary: Daily contact with patient to assess and evaluate symptoms and progress in treatment Medication management  Plan: Start Ferrous sulfate 325 mg bid with food for anemia. Sudafed 12 hr. 120 mg bid for congestions. Continue current treatment plan.  Armandina Stammer I 09/16/2012, 9:45 AM  Agree with above assessment. Patient very anxious about discharge and relapse. Will plan for discharge tomorrow.

## 2012-09-16 NOTE — Progress Notes (Signed)
Patient denied SI and HI.  Denied A/V hallucinations.   Denied pain at this time.  Went to dining room for dinner.  Has been cooperative and pleasant.

## 2012-09-17 DIAGNOSIS — F329 Major depressive disorder, single episode, unspecified: Secondary | ICD-10-CM

## 2012-09-17 MED ORDER — PSEUDOEPHEDRINE HCL ER 120 MG PO TB12: 120.0000 mg | ORAL_TABLET | Freq: Two times a day (BID) | ORAL | Status: DC

## 2012-09-17 MED ORDER — PANTOPRAZOLE SODIUM 40 MG PO TBEC: 40.0000 mg | DELAYED_RELEASE_TABLET | Freq: Every day | ORAL | Status: DC

## 2012-09-17 MED ORDER — FERROUS SULFATE 325 (65 FE) MG PO TABS: 325.0000 mg | ORAL_TABLET | Freq: Two times a day (BID) | ORAL | Status: DC

## 2012-09-17 MED ORDER — CITALOPRAM HYDROBROMIDE 20 MG PO TABS: 20.0000 mg | ORAL_TABLET | Freq: Every day | ORAL | Status: DC

## 2012-09-17 MED ORDER — TRAZODONE HCL 100 MG PO TABS: 100.0000 mg | ORAL_TABLET | Freq: Every evening | ORAL | Status: DC | PRN

## 2012-09-17 NOTE — Discharge Planning (Signed)
Pt did not attend discharge planning group.   

## 2012-09-17 NOTE — Tx Team (Signed)
Interdisciplinary Treatment Plan Update (Adult)  Date:  09/17/2012  Time Reviewed:  9:40 AM   Progress in Treatment: Attending groups: Yes Participating in groups:  Yes Taking medication as prescribed: Yes Tolerating medication:  Yes Family/Significant othe contact made:  Yes Patient understands diagnosis:  Yes Discussing patient identified problems/goals with staff:  Yes Medical problems stabilized or resolved:  Yes Denies suicidal/homicidal ideation: Yes Issues/concerns per patient self-inventory:  None identified Other: N/A  New problem(s) identified: None Identified  Reason for Continuation of Hospitalization: Stable to d/c  Interventions implemented related to continuation of hospitalization: Stable to d/c  Additional comments: N/A  Estimated length of stay: D/C today  Discharge Plan: Pt will follow up with [facility] on Thursday for further treatment.    New goal(s): N/A  Review of initial/current patient goals per problem list:    1.  Goal(s): Address substance use  Met:  Yes  Target date: by discharge  As evidenced by: completed detox protocol and referred to appropriate treatment  2.  Goal (s): Reduce depressive and anxiety symptoms  Met:  Yes  Target date: by discharge  As evidenced by: Reducing depression from a 10 to a 3 as reported by pt.  Pt rates at a 2 today.   3.  Goal(s): Eliminate SI  Met:  Yes  Target date: by discharge  As evidenced by: Pt denies SI.    Attendees: Patient:  Phillip Kemp  09/17/2012 9:40 AM   Family:     Physician:  Patrick North, MD 09/17/2012 9:40 AM   Nursing: Barrie Folk, RN 09/17/2012 9:40 AM   Case Manager:  Reyes Ivan, LCSWA 09/17/2012 9:40 AM   Counselor:  Ronda Fairly, LCSWA 09/17/2012 9:40 AM   Other:  Alease Frame, RN 09/17/2012 9:40 AM   Other:     Other:     Other:      Scribe for Treatment Team:   Reyes Ivan 09/17/2012 9:40 AM

## 2012-09-17 NOTE — Progress Notes (Signed)
D: Patient appeared to be doing well at the beginning of this shift. Mood and affect appropriate. He reported having a good day. Although he said his blood pressure has been low all day. Denied SI/HI,  denied hallucinations and denied withdrawals. A: Writer offered patient a large cup pf Gatorade. Encouraged patient to attend Karaoke. Offered PRN Trazodone for sleep at HS. R: Patient attended Karaoke. Received HS medications. Q 15 minute check continues to maintain safety.

## 2012-09-17 NOTE — Progress Notes (Signed)
Patient did attend the evening karaoke group. Pt was supportive and engaged.

## 2012-09-17 NOTE — [facility] Suicide Risk Assessment (Signed)
Suicide Risk Assessment  Discharge Assessment     Demographic Factors:  Male, Low socioeconomic status, Living alone and Unemployed  Mental Status Per Nursing Assessment::   On Admission:  Suicidal ideation indicated by patient  Current Mental Status by Physician: Patient alert and oriented to 4. Speech normal in rate and volume. Mood improved. Denies SI/HI/AH/VH. Fair insight and judgement.  Loss Factors: Decrease in vocational status and Decline in physical health  Historical Factors: Impulsivity  Risk Reduction Factors:   Positive coping skills or problem solving skills  Continued Clinical Symptoms:  Alcohol/Substance Abuse/Dependencies  Cognitive Features That Contribute To Risk:  Thought constriction (tunnel vision)    Suicide Risk:  Minimal: No identifiable suicidal ideation.  Patients presenting with no risk factors but with morbid ruminations; may be classified as minimal risk based on the severity of the depressive symptoms  Discharge Diagnoses:   AXIS I:  Alcohol Abuse and Depressive Disorder NOS AXIS II:  No diagnosis AXIS III:   Past Medical History  Diagnosis Date  . Arthritis   . Substance abuse     Cocaine, marijuana, and alcohol  . Suicidal ideation 09/09/2012  . Chronic blood loss anemia 09/09/2012  . Rectal bleeding 09/08/2012  . Acute alcoholic pancreatitis 09/08/2012   AXIS IV:  housing problems AXIS V:  61-70 mild symptoms  Plan Of Care/Follow-up recommendations:  Activity:  normal Diet:  normal Follow up with [facility].  Is patient on multiple antipsychotic therapies at discharge:  No   Has Patient had three or more failed trials of antipsychotic monotherapy by history:  No  Recommended Plan for Multiple Antipsychotic Therapies: NA  Phillip Kemp 09/17/2012, 9:22 AM

## 2012-09-17 NOTE — Progress Notes (Signed)
(  D) Reports sleep as fair, appetite poor, energy level low, ability to pay attention good, depression 5/10 and feelings of hopelessness at 3/10. Withdrawal symptoms include tremors and cravings. Denies SI/HI. Rated pain at 3 with a goal of two on daily inventory but approached nurses station rating pain at 9 in abdomen which he associated to his pancreatitis. (A) Administered Tylenol. Mylanta given earlier in morning with no relief. (R) Responsive to encouragement. Attended after-care planning group. Joice Lofts RN MS EdS 09/17/2012  9:18 AM

## 2012-09-17 NOTE — Discharge Summary (Signed)
Physician Discharge Summary Note  Patient:  Phillip Kemp is an 51 y.o., male MRN:  [id number] DOB:  [birth year] Patient phone:  [phone] (home)  Patient address:   [address] [place] [address]   Date of Admission:  09/11/2012 Date of Discharge: 09/17/2012  Discharge Diagnoses: Alcohol Dependence, Depressive d/o NOS  Axis Diagnosis:  AXIS I: Alcohol Abuse and Depressive Disorder NOS  AXIS II: No diagnosis  AXIS III:  Past Medical History   Diagnosis  Date   .  Arthritis    .  Substance abuse      Cocaine, marijuana, and alcohol   .  Suicidal ideation  09/09/2012   .  Chronic blood loss anemia  09/09/2012   .  Rectal bleeding  09/08/2012   .  Acute alcoholic pancreatitis  09/08/2012    AXIS IV: housing problems  AXIS V: 61-70 mild symptoms    Level of Care:  Inpatient Hospitalization.  Reason for admission: Phillip Kemp is a 74 you SAAM who decided over the past few weeks he was tired of drinking and using drugs. He starting having passing thoughts of hurting himself because he was so frustrated with his long term alchol and drug use. States there was not any one major event that occurred that led him to come for voluntary admission for detox other than he was tired of living the way he was. States he has children and grandchildren and that is reason to stop the drinking and drug use. Phillip Kemp denies to this FNP AH/VH/HI/ SI. State the SI has significantly reduced and he is feeling much better since he came to the ED a few days ago.   Hospital Course:   The patient attended treatment team meeting this am and met with treatment team members. The patient's symptoms, treatment plan and response to treatment was discussed. The patient endorsed that their symptoms have improved. The patient also stated that they felt stable for discharge.  They reported that from this hospital stay they had learned many coping skills.  In other to maintain their psychiatric stability, they  will continue psychiatric care on an outpatient basis. They will follow-up as outlined below.  In addition they were instructed  to take all your medications as prescribed by their mental healthcare provider and to report any adverse effects and or reactions from your medicines to their outpatient provider promptly.  The patient is also instructed and cautioned to not engage in alcohol and or illegal drug use while on prescription medicines.  In the event of worsening symptoms the patient is instructed to call the crisis hotline, 911 and or go to the nearest ED for appropriate evaluation and treatment of symptoms.   Also while a patient in this hospital, the patient received medication management for his psychiatric symptoms. They were ordered and received as outlined below:    Medication List     As of 09/17/2012  2:25 PM    STOP taking these medications         folic acid 1 MG tablet   Commonly known as: FOLVITE      MULTIVITAMIN PO      neomycin-bacitracin-polymyxin ointment   Commonly known as: NEOSPORIN      thiamine 100 MG tablet      TYLENOL ARTHRITIS PAIN 650 MG CR tablet   Generic drug: acetaminophen      TAKE these medications      Indication    citalopram 20 MG tablet  Commonly known as: CELEXA   Take 1 tablet (20 mg total) by mouth daily.       ferrous sulfate 325 (65 FE) MG tablet   Take 1 tablet (325 mg total) by mouth 2 (two) times daily with a meal. For chronic anemia       pantoprazole 40 MG tablet   Commonly known as: PROTONIX   Take 1 tablet (40 mg total) by mouth daily. For gerd.       pseudoephedrine 120 MG 12 hr tablet   Commonly known as: SUDAFED   Take 1 tablet (120 mg total) by mouth 2 (two) times daily. For nasal congestion       traZODone 100 MG tablet   Commonly known as: DESYREL   Take 1 tablet (100 mg total) by mouth at bedtime as needed and may repeat dose one time if needed for sleep. For insomnia        They were also enrolled in group  counseling sessions and activities in which they participated actively.       Follow-up Information    Follow up with [facility]. On 09/23/2012. (Arrive there at 8:00 am promptly!)    Contact information:   [address] [place], [place] [address] [phone]         Upon discharge, patient adamantly denies suicidal, homicidal ideations, auditory, visual hallucinations and or delusional thinking. They left [facility] with all personal belongings via public transportation in no apparent distress.  Consults:  Please see electronic medical record for details.  Significant Diagnostic Studies:  Please see electronic medical record for details.  Discharge Vitals:   Blood pressure 93/62, pulse 108, temperature 97.1 F (36.2 C), temperature source Oral, resp. rate 16, height 6' 0.99" (1.854 m), weight 62.596 kg (138 lb)..  Mental Status Exam: See Mental Status Examination and Suicide Risk Assessment completed by Attending Physician prior to discharge.  Discharge destination:  Follow up with [facility]  Is patient on multiple antipsychotic therapies at discharge:  No  Has Patient had three or more failed trials of antipsychotic monotherapy by history: N/A Recommended Plan for Multiple Antipsychotic Therapies: N/A Discharge Orders    Future Appointments: Provider: Department: Dept Phone: Center:   09/23/2012 9:30 AM Salley Scarlet, MD [facility] [phone] [facility]   10/04/2012 11:00 AM Nira Retort, NP [facility] [phone] [facility]     Future Orders Please Complete By Expires   Diet - low sodium heart healthy      Increase activity slowly          Medication List     As of 09/17/2012  2:25 PM    STOP taking these medications         folic acid 1 MG tablet   Commonly known as: FOLVITE      MULTIVITAMIN PO      neomycin-bacitracin-polymyxin ointment   Commonly known as: NEOSPORIN      thiamine 100 MG tablet      TYLENOL ARTHRITIS PAIN  650 MG CR tablet   Generic drug: acetaminophen      TAKE these medications      Indication    citalopram 20 MG tablet   Commonly known as: CELEXA   Take 1 tablet (20 mg total) by mouth daily.       ferrous sulfate 325 (65 FE) MG tablet   Take 1 tablet (325 mg total) by mouth 2 (two) times daily with a meal. For chronic anemia  pantoprazole 40 MG tablet   Commonly known as: PROTONIX   Take 1 tablet (40 mg total) by mouth daily. For gerd.       pseudoephedrine 120 MG 12 hr tablet   Commonly known as: SUDAFED   Take 1 tablet (120 mg total) by mouth 2 (two) times daily. For nasal congestion       traZODone 100 MG tablet   Commonly known as: DESYREL   Take 1 tablet (100 mg total) by mouth at bedtime as needed and may repeat dose one time if needed for sleep. For insomnia            Follow-up Information    Follow up with [facility]. On 09/23/2012. (Arrive there at 8:00 am promptly!)    Contact information:   [address] [place], [place] [address] [phone]        Follow-up recommendations:   Activities: Resume typical activities Diet: Resume typical diet Other: Follow up with outpatient provider and report any side effects to out patient prescriber.  Comments:  Take all your medications as prescribed by your mental healthcare provider. Report any adverse effects and or reactions from your medicines to your outpatient provider promptly. Patient is instructed and cautioned to not engage in alcohol and or illegal drug use while on prescription medicines. In the event of worsening symptoms, patient is instructed to call the crisis hotline, 911 and or go to the nearest ED for appropriate evaluation and treatment of symptoms. Follow-up with your primary care provider for your other medical issues, concerns and or health care needs.  SignedDaleen Bo, Weda Baumgarner 09/17/2012 2:25 PM

## 2012-09-17 NOTE — Progress Notes (Signed)
[facility] Group Notes:  (Counselor/Nursing/MHT/Case Management/Adjunct)  09/16/12 12:01 PM  Type of Therapy:  Group Therapy  Participation Level:  Did not attend  Participation Quality:    Affect:    Cognitive:    Insight:    Engagement in Group:    Engagement in Therapy:    Modes of Intervention:    Summary of Progress/Problems:   Phillip Kemp 09/16/12 12:01 PM

## 2012-09-17 NOTE — Progress Notes (Signed)
[facility] Case Management Discharge Plan:  Will you be returning to the same living situation after discharge: No. Homeless - states that he will get a motel until [facility] on Thursday At discharge, do you have transportation home?:Yes,  provided a bus pass Do you have the ability to pay for your medications:Yes,  access to meds, provided samples   Release of information consent forms completed and in the chart;  Patient's signature needed at discharge.  Patient to Follow up at:  Follow-up Information    Follow up with [facility]. On 09/23/2012. (Arrive there at 8:00 am promptly!)    Contact information:   [address] [place], [place] [address] [phone]         Patient denies SI/HI:   Yes,  denies SI/HI today    Safety Planning and Suicide Prevention discussed:  Yes,  discussed with pt today  Barrier to discharge identified:No.  Summary and Recommendations: Pt attended discharge planning group and actively participated in group.  SW provided pt with today's workbook.  Pt presents with calm mood and affect.  Pt rates depression and anxiety at a 2 today.  Pt denies SI/HI today.  Pt reports feeling stable to d/c today.  No recommendations from SW.  No further needs voiced by pt.  Pt stable to discharge.     Carmina Miller 09/17/2012, 11:06 AM

## 2012-09-20 NOTE — Progress Notes (Signed)
Patient Discharge Instructions:  After Visit Summary (AVS):   Faxed to:  09/20/2012 Discharge Summary Note:   Faxed to:  09/20/2012 Suicide Risk Assessment - Discharge Assessment:   Faxed to:  09/20/2012 Faxed/Sent to the Next Level Care provider:  09/20/2012  [facility] [place] faxed to [phone]  Karleen Hampshire Brittini, 09/20/2012, 3:10 PM

## 2012-09-23 ENCOUNTER — Ambulatory Visit: Payer: Medicare Other | Admitting: Family Medicine

## 2012-09-24 ENCOUNTER — Observation Stay (HOSPITAL_COMMUNITY): Payer: Medicare Other

## 2012-09-24 ENCOUNTER — Emergency Department (HOSPITAL_COMMUNITY): Payer: Medicare Other

## 2012-09-24 ENCOUNTER — Encounter (HOSPITAL_COMMUNITY): Payer: Self-pay | Admitting: Emergency Medicine

## 2012-09-24 ENCOUNTER — Observation Stay (HOSPITAL_COMMUNITY)
Admission: EM | Admit: 2012-09-24 | Discharge: 2012-09-26 | Disposition: A | Discharge status: Home / Self Care | Payer: Medicare Other | Attending: Internal Medicine | Admitting: Internal Medicine

## 2012-09-24 DIAGNOSIS — R0602 Shortness of breath: Secondary | ICD-10-CM | POA: Insufficient documentation

## 2012-09-24 DIAGNOSIS — K625 Hemorrhage of anus and rectum: Secondary | ICD-10-CM

## 2012-09-24 DIAGNOSIS — Z5900 Homelessness unspecified: Secondary | ICD-10-CM | POA: Insufficient documentation

## 2012-09-24 DIAGNOSIS — R000 Tachycardia, unspecified: Secondary | ICD-10-CM | POA: Insufficient documentation

## 2012-09-24 DIAGNOSIS — F1910 Other psychoactive substance abuse, uncomplicated: Secondary | ICD-10-CM | POA: Insufficient documentation

## 2012-09-24 DIAGNOSIS — K921 Melena: Secondary | ICD-10-CM | POA: Insufficient documentation

## 2012-09-24 DIAGNOSIS — R51 Headache: Secondary | ICD-10-CM | POA: Insufficient documentation

## 2012-09-24 DIAGNOSIS — R0789 Other chest pain: Secondary | ICD-10-CM

## 2012-09-24 DIAGNOSIS — R262 Difficulty in walking, not elsewhere classified: Secondary | ICD-10-CM | POA: Insufficient documentation

## 2012-09-24 DIAGNOSIS — R5381 Other malaise: Secondary | ICD-10-CM | POA: Insufficient documentation

## 2012-09-24 DIAGNOSIS — R072 Precordial pain: Principal | ICD-10-CM | POA: Insufficient documentation

## 2012-09-24 DIAGNOSIS — R197 Diarrhea, unspecified: Secondary | ICD-10-CM | POA: Insufficient documentation

## 2012-09-24 DIAGNOSIS — F329 Major depressive disorder, single episode, unspecified: Secondary | ICD-10-CM | POA: Diagnosis present

## 2012-09-24 DIAGNOSIS — K7689 Other specified diseases of liver: Secondary | ICD-10-CM | POA: Insufficient documentation

## 2012-09-24 DIAGNOSIS — R42 Dizziness and giddiness: Secondary | ICD-10-CM | POA: Insufficient documentation

## 2012-09-24 DIAGNOSIS — R079 Chest pain, unspecified: Secondary | ICD-10-CM

## 2012-09-24 DIAGNOSIS — F1010 Alcohol abuse, uncomplicated: Secondary | ICD-10-CM | POA: Insufficient documentation

## 2012-09-24 DIAGNOSIS — R110 Nausea: Secondary | ICD-10-CM | POA: Insufficient documentation

## 2012-09-24 DIAGNOSIS — R109 Unspecified abdominal pain: Secondary | ICD-10-CM | POA: Insufficient documentation

## 2012-09-24 DIAGNOSIS — F32A Depression, unspecified: Secondary | ICD-10-CM | POA: Diagnosis present

## 2012-09-24 DIAGNOSIS — E279 Disorder of adrenal gland, unspecified: Secondary | ICD-10-CM | POA: Insufficient documentation

## 2012-09-24 DIAGNOSIS — E278 Other specified disorders of adrenal gland: Secondary | ICD-10-CM | POA: Diagnosis present

## 2012-09-24 DIAGNOSIS — R599 Enlarged lymph nodes, unspecified: Secondary | ICD-10-CM | POA: Insufficient documentation

## 2012-09-24 DIAGNOSIS — I959 Hypotension, unspecified: Secondary | ICD-10-CM | POA: Insufficient documentation

## 2012-09-24 DIAGNOSIS — F3289 Other specified depressive episodes: Secondary | ICD-10-CM | POA: Insufficient documentation

## 2012-09-24 DIAGNOSIS — R5383 Other fatigue: Secondary | ICD-10-CM | POA: Insufficient documentation

## 2012-09-24 DIAGNOSIS — F1410 Cocaine abuse, uncomplicated: Secondary | ICD-10-CM | POA: Insufficient documentation

## 2012-09-24 DIAGNOSIS — D649 Anemia, unspecified: Secondary | ICD-10-CM | POA: Diagnosis present

## 2012-09-24 DIAGNOSIS — M129 Arthropathy, unspecified: Secondary | ICD-10-CM | POA: Insufficient documentation

## 2012-09-24 LAB — URINALYSIS, ROUTINE W REFLEX MICROSCOPIC
Glucose, UA: NEGATIVE mg/dL
Hgb urine dipstick: NEGATIVE
Protein, ur: NEGATIVE mg/dL

## 2012-09-24 LAB — TROPONIN I
Troponin I: <0.3 ng/mL (ref <0.30)
Troponin I: <0.3 ng/mL (ref <0.30)

## 2012-09-24 LAB — COMPREHENSIVE METABOLIC PANEL
ALT: 37 U/L (ref 0–53)
AST: 25 U/L (ref 0–37)
Albumin: 3 g/dL — ABNORMAL LOW (ref 3.5–5.2)
Alkaline Phosphatase: 119 U/L — ABNORMAL HIGH (ref 39–117)
BUN: 22 mg/dL (ref 6–23)
CO2: 22 mEq/L (ref 19–32)
Total Bilirubin: 0.6 mg/dL (ref 0.3–1.2)
Total Protein: 7 g/dL (ref 6.0–8.3)

## 2012-09-24 LAB — CBC WITH DIFFERENTIAL/PLATELET
Eosinophils Absolute: 0 10*3/uL (ref 0.0–0.7)
Eosinophils Relative: 0 % (ref 0–5)
Hemoglobin: 11.7 g/dL — ABNORMAL LOW (ref 13.0–17.0)
Lymphs Abs: 1.6 10*3/uL (ref 0.7–4.0)
MCH: 26.5 pg (ref 26.0–34.0)
MCV: 77.1 fL — ABNORMAL LOW (ref 78.0–100.0)
Monocytes Relative: 6 % (ref 3–12)
Platelets: 153 10*3/uL (ref 150–400)
RBC: 4.41 MIL/uL (ref 4.22–5.81)

## 2012-09-24 LAB — URINE MICROSCOPIC-ADD ON

## 2012-09-24 LAB — D-DIMER, QUANTITATIVE: D-Dimer, Quant: 0.98 ug/mL-FEU — ABNORMAL HIGH (ref 0.00–0.48)

## 2012-09-24 LAB — LIPASE, BLOOD: Lipase: 24 U/L (ref 11–59)

## 2012-09-24 LAB — RAPID URINE DRUG SCREEN, HOSP PERFORMED
Amphetamines: NOT DETECTED
Benzodiazepines: POSITIVE — AB
Tetrahydrocannabinol: NOT DETECTED

## 2012-09-24 LAB — ETHANOL: Alcohol, Ethyl (B): <11 mg/dL (ref 0–11)

## 2012-09-24 MED ORDER — HYDROCODONE-ACETAMINOPHEN 5-325 MG PO TABS
1.0000 | ORAL_TABLET | ORAL | Status: DC | PRN
  Administered 2012-09-25: 2 via ORAL
  Filled 2012-09-24: qty 2

## 2012-09-24 MED ORDER — HYDROMORPHONE HCL PF 1 MG/ML IJ SOLN: 1.0000 mg | INTRAMUSCULAR | Status: DC | PRN

## 2012-09-24 MED ORDER — SODIUM CHLORIDE 0.9 % IJ SOLN
3.0000 mL | Freq: Two times a day (BID) | INTRAMUSCULAR | Status: DC
  Administered 2012-09-24: 3 mL via INTRAVENOUS

## 2012-09-24 MED ORDER — NITROGLYCERIN 0.3 MG SL SUBL: 0.3000 mg | SUBLINGUAL_TABLET | SUBLINGUAL | Status: DC | PRN

## 2012-09-24 MED ORDER — FERROUS SULFATE 325 (65 FE) MG PO TABS
325.0000 mg | ORAL_TABLET | Freq: Every day | ORAL | Status: DC
  Administered 2012-09-24 – 2012-09-26 (×3): 325 mg via ORAL
  Filled 2012-09-24 (×3): qty 1

## 2012-09-24 MED ORDER — IOHEXOL 350 MG/ML SOLN
100.0000 mL | Freq: Once | INTRAVENOUS | Status: AC | PRN
Start: 2012-09-24 — End: 2012-09-24
  Administered 2012-09-24: 100 mL via INTRAVENOUS

## 2012-09-24 MED ORDER — CITALOPRAM HYDROBROMIDE 20 MG PO TABS
20.0000 mg | ORAL_TABLET | Freq: Every day | ORAL | Status: DC
  Administered 2012-09-24 – 2012-09-26 (×3): 20 mg via ORAL
  Filled 2012-09-24 (×3): qty 1

## 2012-09-24 MED ORDER — SODIUM CHLORIDE 0.9 % IV BOLUS (SEPSIS)
500.0000 mL | Freq: Once | INTRAVENOUS | Status: AC
  Administered 2012-09-24: 500 mL via INTRAVENOUS

## 2012-09-24 MED ORDER — ACETAMINOPHEN 325 MG PO TABS
650.0000 mg | ORAL_TABLET | Freq: Once | ORAL | Status: AC
  Administered 2012-09-24: 650 mg via ORAL

## 2012-09-24 MED ORDER — NITROGLYCERIN 0.4 MG SL SUBL: 0.4000 mg | SUBLINGUAL_TABLET | SUBLINGUAL | Status: DC | PRN

## 2012-09-24 MED ORDER — ACETAMINOPHEN 325 MG PO TABS
ORAL_TABLET | ORAL | Status: AC
  Filled 2012-09-24: qty 2

## 2012-09-24 MED ORDER — ACETAMINOPHEN 650 MG RE SUPP: 650.0000 mg | Freq: Four times a day (QID) | RECTAL | Status: DC | PRN

## 2012-09-24 MED ORDER — TRAZODONE HCL 100 MG PO TABS
100.0000 mg | ORAL_TABLET | Freq: Every evening | ORAL | Status: DC | PRN
  Filled 2012-09-24: qty 1

## 2012-09-24 MED ORDER — ONDANSETRON HCL 4 MG/2ML IJ SOLN: 4.0000 mg | Freq: Four times a day (QID) | INTRAMUSCULAR | Status: DC | PRN

## 2012-09-24 MED ORDER — ONDANSETRON HCL 4 MG PO TABS: 4.0000 mg | ORAL_TABLET | Freq: Four times a day (QID) | ORAL | Status: DC | PRN

## 2012-09-24 MED ORDER — SODIUM CHLORIDE 0.9 % IV SOLN: INTRAVENOUS | Status: DC

## 2012-09-24 MED ORDER — ACETAMINOPHEN 325 MG PO TABS: 650.0000 mg | ORAL_TABLET | Freq: Four times a day (QID) | ORAL | Status: DC | PRN

## 2012-09-24 MED ORDER — PANTOPRAZOLE SODIUM 40 MG PO TBEC
40.0000 mg | DELAYED_RELEASE_TABLET | Freq: Every day | ORAL | Status: DC
  Administered 2012-09-24 – 2012-09-26 (×3): 40 mg via ORAL
  Filled 2012-09-24 (×3): qty 1

## 2012-09-24 MED ORDER — ALUM & MAG HYDROXIDE-SIMETH 200-200-20 MG/5ML PO SUSP: 30.0000 mL | Freq: Four times a day (QID) | ORAL | Status: DC | PRN

## 2012-09-24 MED ORDER — SODIUM CHLORIDE 0.9 % IV BOLUS (SEPSIS): 500.0000 mL | Freq: Once | INTRAVENOUS | Status: DC

## 2012-09-24 MED ORDER — ASPIRIN EC 325 MG PO TBEC
325.0000 mg | DELAYED_RELEASE_TABLET | Freq: Every day | ORAL | Status: DC
  Administered 2012-09-24 – 2012-09-26 (×3): 325 mg via ORAL
  Filled 2012-09-24 (×3): qty 1

## 2012-09-24 MED ORDER — SODIUM CHLORIDE 0.9 % IV SOLN: INTRAVENOUS | Status: AC

## 2012-09-24 NOTE — ED Notes (Signed)
Pt states he has began having sob and bilateral flank pain this morning while walking.  Pt states he has been having dizziness for past week since he was diagnosed with anemia.  Pt states he also has a headache since 3 days ago.  PT also complained of chest pain to fire dept before ems arrived, but denies chest pain now.   No sob at present, lungs clear.  Pt also wants help with etoh and drug detox.  Pt states last taken was a week ago.

## 2012-09-24 NOTE — ED Notes (Signed)
Duanne Moron, RN made aware that MD will be text d-dimer results of 0.98ug/ml

## 2012-09-24 NOTE — H&P (Signed)
History and Physical       Hospital Admission Note Date: 09/24/2012  Patient name: Phillip Kemp Medical record number: [id number] Date of birth: Apr 29, 1961 Age: 51 y.o. Gender: male PCP: Milinda Antis, MD    Chief Complaint:  Chest pain for last 2 days  HPI: Patient is a 51 year old African American male with past medical history of alcohol abuse, cocaine abuse who presented to ED which has been in the last 2 days. Patient was recently discharged from the behavioral health center when he was admitted there with substance abuse. History is obtained from the patient who admitted that he was using cocaine for 2 days (Monday and Tuesday), subsequently started having chest pain midsternal for last 2 days. Patient also endorsed having nausea, dizziness, and lightheadedness and shortness of breath with exertion. He feels short of breath after walking 2 blocks. He felt very weak but no focal weakness and hence presented to the ED. He also reports having some diarrhea and has noticed some blood in the stools but then also takes iron supplementation. Patient on endorsed having beer after the discharge from [facility]. Patient states that he was scheduled to go to [facility] facility on 09/23/2012 but they told him that his bed was not ready till 10/03/2012.   Review of Systems:  Constitutional: Denies fever, chills, diaphoresis, appetite change and fatigue.  HEENT: Denies photophobia, eye pain, redness, hearing loss, ear pain, congestion, sore throat, rhinorrhea, sneezing, mouth sores, trouble swallowing, neck pain, neck stiffness and tinnitus.   Respiratory: Denies SOB, DOE, cough, chest tightness,  and wheezing.   Cardiovascular: Please see history of present illness  Gastrointestinal: Please see history of present illness Genitourinary: Denies dysuria, urgency, frequency, hematuria, flank pain and difficulty urinating.  Musculoskeletal: Denies  myalgias, back pain, joint swelling, arthralgias and gait problem.  Skin: Denies pallor, rash and wound.  Neurological: Denies any seizures or syncopal episode, endorses having dizziness Hematological: Denies adenopathy. Easy bruising, personal or family bleeding history  Psychiatric/Behavioral: Denies suicidal ideation, mood changes, confusion, nervousness, sleep disturbance and agitation  Past Medical History: Past Medical History  Diagnosis Date  . Arthritis   . Substance abuse     Cocaine, marijuana, and alcohol  . Suicidal ideation 09/09/2012  . Chronic blood loss anemia 09/09/2012  . Rectal bleeding 09/08/2012  . Acute alcoholic pancreatitis 09/08/2012   Past Surgical History  Procedure Date  . Orthopedic surgery     Left foot surgery    Medications: Prior to Admission medications   Medication Sig Start Date End Date Taking? Authorizing Provider  citalopram (CELEXA) 20 MG tablet Take 1 tablet (20 mg total) by mouth daily. 09/17/12  Yes Himabindu Ravi, MD  ferrous sulfate 325 (65 FE) MG tablet Take 325 mg by mouth daily. For chronic anemia 09/17/12  Yes Himabindu Ravi, MD  pseudoephedrine (SUDAFED) 120 MG 12 hr tablet Take 1 tablet (120 mg total) by mouth 2 (two) times daily. For nasal congestion 09/17/12  Yes Himabindu Ravi, MD  traZODone (DESYREL) 100 MG tablet Take 1 tablet (100 mg total) by mouth at bedtime as needed and may repeat dose one time if needed for sleep. For insomnia 09/17/12  Yes Himabindu Ravi, MD  pantoprazole (PROTONIX) 40 MG tablet Take 1 tablet (40 mg total) by mouth daily. For gerd. 09/17/12   Himabindu Ravi, MD    Allergies:  No Known Allergies  Social History:  reports that he has been smoking Cigarettes.  He has been smoking about .5 packs  per day. He has never used smokeless tobacco. He reports that he drinks about 28.8 ounces of alcohol per week. He reports that he uses illicit drugs (Cocaine).he is homeless and is living in a motel. He states that he buys  the cocaine with his disability check.  Family History: Family History  Problem Relation Age of Onset  . Heart disease Mother   . Cancer Father     prostate    Physical Exam: Blood pressure 115/60, pulse 107, temperature 98.5 F (36.9 C), temperature source Oral, resp. rate 20, SpO2 100.00%. General: Alert, awake, oriented x3, in no acute distress, cachectic appearing. HEENT: normocephalic, atraumatic, anicteric sclera, pink conjunctiva, pupils equal and reactive to light and accomodation, oropharynx clear Neck: supple, no masses or lymphadenopathy, no goiter, no bruits  Heart: Regular rate and rhythm, without murmurs, rubs or gallops. Lungs: Clear to auscultation bilaterally, no wheezing, rales or rhonchi. Abdomen: Soft, nontender, nondistended, positive bowel sounds, no masses. Extremities: No clubbing, cyanosis or edema with positive pedal pulses. Neuro: Grossly intact, no focal neurological deficits, strength 5/5 upper and lower extremities bilaterally Psych: alert and oriented x 3, normal mood and affect Skin: no rashes or lesions, warm and dry   LABS on Admission:  Basic Metabolic Panel:  Lab 09/24/12 4098  NA 134*  K 4.0  CL 97  CO2 22  GLUCOSE 97  BUN 22  CREATININE 0.78  CALCIUM 10.4  MG --  PHOS --   Liver Function Tests:  Lab 09/24/12 1425  AST 25  ALT 37  ALKPHOS 119*  BILITOT 0.6  PROT 7.0  ALBUMIN 3.0*    Lab 09/24/12 1425  LIPASE 24  AMYLASE --   CBC:  Lab 09/24/12 1425  WBC 12.6*  NEUTROABS 10.3*  HGB 11.7*  HCT 34.0*  MCV 77.1*  PLT 153   Cardiac Enzymes:  Lab 09/24/12 1425  CKTOTAL --  CKMB --  CKMBINDEX --  TROPONINI <0.30     Radiological Exams on Admission: Dg Chest 2 View  09/24/2012  *RADIOLOGY REPORT*  Clinical Data: Chest pain.  CHEST - 2 VIEW  Comparison: 10/20/2011.  Findings: The heart, mediastinum and hilar contours are normal. The lungs are clear and fully expanded.  No edema or consolidation. No effusions or  pneumothoraces.  The chest wall is normal.  IMPRESSION: No active disease.   Original Report Authenticated By: Mervin Hack, M.D.    Ct Abdomen Pelvis W Contrast  09/08/2012    IMPRESSION: 1.  No acute abnormality. 2.  Tiny hypervascular lesion measuring 9 mm in the right hepatic lobe probably represents a small flash fill hemangioma. 3.  Left adrenal mass measuring 33 mm x 19 mm.  Differential considerations discussed above.  Follow-up adrenal MRI recommended for further assessment. Non-emergent MRI should be deferred until patient has been discharged for the acute illness, and can optimally cooperate with positioning and breath-holding instructions. 4.  Mild enlargement of the common bile duct.  This may relate to prior passage of stones or sphincter dysfunction.  No calcified common duct stone identified.  Correlate with bilirubin and liver function tests. 5.  Atherosclerosis.   Original Report Authenticated By: Andreas Newport, M.D.    EKG shows rate of 112, sinus tachycardia, marked T wave inversions in antero-lateral leads   Assessment/Plan Principal Problem:  *Chest pain, midsternal : Likely secondary to cocaine use, hemoglobin is stable. First set troponin is negative however EKG does show marked T wave inversions in anterolateral leads - Admit under  observation, obtain serial cardiac enzymes, d-dimer was elevated hence obtain CT angiogram of the chest to rule out PE - Placed on aspirin, nitroglycerin sublingual when necessary. No beta blockers secondary to active cocaine use. - I will also obtain 2-D echocardiogram to assess for any cardiomyopathy or ischemia.  Active Problems:  Substance abuse and cocaine abuse - Patient was strongly counseled to quit cocaine. - I have placed psychiatry consultation and a social work consultation to assist with his substance abuse rehabilitation.   Anemia: - Currently stable, continue iron supplementation   Left adrenal mass: known, patient  will need a followup MRI in the outpatient setting  Questionable diarrhea: Will check C. Difficile, stool studies.   Depression: Currently not suicidal ideation.  DVT prophylaxis: SCDs  CODE STATUS: Presumed full code  Further plan will depend as patient's clinical course evolves and further radiologic and laboratory data become available.   Time Spent on Admission: 1 hour  Cherity Blickenstaff M.D. [facility] 09/24/2012, 5:13 PM Pager: [phone]  If 7PM-7AM, please contact night-coverage [email or web address] Password [email or web address]

## 2012-09-24 NOTE — ED Notes (Signed)
Pt aware of the need for a urine sample, urinal at bedside. 

## 2012-09-24 NOTE — ED Notes (Addendum)
Uneventful transport from xray.

## 2012-09-24 NOTE — ED Notes (Signed)
Patient transported to X-ray 
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
## 2012-09-24 NOTE — ED Notes (Signed)
[id number]<AV> Expected date:<BR> Expected time:<BR> Means of arrival:<BR> Comments:<BR> [facility] wants rehab/detox == chest pain

## 2012-09-25 DIAGNOSIS — R079 Chest pain, unspecified: Secondary | ICD-10-CM

## 2012-09-25 DIAGNOSIS — R072 Precordial pain: Secondary | ICD-10-CM

## 2012-09-25 DIAGNOSIS — F1920 Other psychoactive substance dependence, uncomplicated: Secondary | ICD-10-CM

## 2012-09-25 LAB — BASIC METABOLIC PANEL
BUN: 23 mg/dL (ref 6–23)
Calcium: 9.6 mg/dL (ref 8.4–10.5)
Creatinine, Ser: 0.66 mg/dL (ref 0.50–1.35)
GFR calc non Af Amer: >90 mL/min (ref >90)
Glucose, Bld: 107 mg/dL — ABNORMAL HIGH (ref 70–99)

## 2012-09-25 LAB — TSH: TSH: <0.008 u[IU]/mL — ABNORMAL LOW (ref 0.350–4.500)

## 2012-09-25 LAB — CK TOTAL AND CKMB (NOT AT ARMC)
CK, MB: 1.4 ng/mL (ref 0.3–4.0)
CK, MB: 1.4 ng/mL (ref 0.3–4.0)
Relative Index: INVALID (ref 0.0–2.5)
Relative Index: INVALID (ref 0.0–2.5)

## 2012-09-25 LAB — CBC
MCH: 26.3 pg (ref 26.0–34.0)
MCHC: 34.3 g/dL (ref 30.0–36.0)
Platelets: 141 10*3/uL — ABNORMAL LOW (ref 150–400)

## 2012-09-25 MED ORDER — FOLIC ACID 1 MG PO TABS
1.0000 mg | ORAL_TABLET | Freq: Every day | ORAL | Status: DC
  Administered 2012-09-25 – 2012-09-26 (×2): 1 mg via ORAL
  Filled 2012-09-25 (×2): qty 1

## 2012-09-25 MED ORDER — LORAZEPAM 2 MG/ML IJ SOLN: 1.0000 mg | Freq: Four times a day (QID) | INTRAMUSCULAR | Status: DC | PRN

## 2012-09-25 MED ORDER — LORAZEPAM 1 MG PO TABS
1.0000 mg | ORAL_TABLET | Freq: Four times a day (QID) | ORAL | Status: DC | PRN
  Administered 2012-09-26: 1 mg via ORAL
  Filled 2012-09-25: qty 1

## 2012-09-25 MED ORDER — ADULT MULTIVITAMIN W/MINERALS CH
1.0000 | ORAL_TABLET | Freq: Every day | ORAL | Status: DC
  Administered 2012-09-25 – 2012-09-26 (×2): 1 via ORAL
  Filled 2012-09-25 (×3): qty 1

## 2012-09-25 MED ORDER — VITAMIN B-1 100 MG PO TABS
100.0000 mg | ORAL_TABLET | Freq: Every day | ORAL | Status: DC
  Administered 2012-09-25 – 2012-09-26 (×2): 100 mg via ORAL
  Filled 2012-09-25 (×2): qty 1

## 2012-09-25 NOTE — Evaluation (Signed)
Occupational Therapy Evaluation Patient Details Name: Phillip Kemp MRN: [id number] DOB: April 05, 1961 Today's Date: 09/25/2012 Time: 4098-1191 OT Time Calculation (min): 16 min cotx with PT  OT Assessment / Plan / Recommendation Clinical Impression  This 51 year old man was admitted with chest pain.  He has a H/o ETOH and substance abuse and was recently discharged from [facility].  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 He is approriate for skilled OT.      OT Assessment  Patient needs continued OT Services    Follow Up Recommendations  No OT follow up    Barriers to Discharge      Equipment Recommendations  None recommended by OT;None recommended by PT    Recommendations for Other Services    Frequency       Precautions / Restrictions Precautions Precautions: Fall Restrictions Weight Bearing Restrictions: No   Pertinent Vitals/Pain No c/o pain    ADL  Grooming: Simulated;Set up Where Assessed - Grooming: Supported standing Upper Body Bathing: Simulated;Set up Where Assessed - Upper Body Bathing: Unsupported sitting Lower Body Bathing: Simulated;Set up Where Assessed - Lower Body Bathing: Supported sit to stand Upper Body Dressing: Simulated;Set up Where Assessed - Upper Body Dressing: Unsupported sitting Lower Body Dressing: Performed;Set up Where Assessed - Lower Body Dressing: Supported sit to stand Toilet Transfer: Counsellor Method: Sit to Barista:  (bed to chair) Toileting - Clothing Manipulation and Hygiene: Simulated (min a for hygiene) Where Assessed - Toileting Clothing Manipulation and Hygiene: Sit to stand from 3-in-1 or toilet Equipment Used:  (iv pole for support part of the time) Transfers/Ambulation Related to ADLs: min guard ambulating ADL Comments: pt has difficulty with toilet hygiene due to elbow ROM limitations    OT Diagnosis:    OT  Problem List: Decreased knowledge of use of DME or AE;Impaired balance (sitting and/or standing) OT Treatment Interventions:     OT Goals Acute Rehab OT Goals OT Goal Formulation: With patient Time For Goal Achievement: 10/09/12 ADL Goals Pt Will Perform Toileting - Hygiene: with modified independence;with adaptive equipment;Sit to stand from 3-in-1/toilet ADL Goal: Toileting - Hygiene - Progress: Goal set today Miscellaneous OT Goals Miscellaneous OT Goal #1: Pt will gather all supplies for adls at mod I level OT Goal: Miscellaneous Goal #1 - Progress: Goal set today  Visit Information  Last OT Received On: 09/25/12 Assistance Needed: +1    Subjective Data  Subjective: I have trouble wiping Patient Stated Goal: get stronger.  Get help for ETOH   Prior Functioning     Home Living Lives With: Other (Comment) (pt is homeless) Available Help at Discharge: Other (Comment) (Trying to get into [facility]. ) Type of Home: Homeless Home Adaptive Equipment: None Prior Function Level of Independence: Independent Able to Take Stairs?: Yes Driving: No Vocation: Unemployed Communication Communication: No difficulties         Vision/Perception     Cognition  Overall Cognitive Status: Appears within functional limits for tasks assessed/performed Arousal/Alertness: Awake/alert Orientation Level: Appears intact for tasks assessed Behavior During Session: [facility] for tasks performed    Extremity/Trunk Assessment Bil UEs:  Deficits.  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  Otherwise wfls     Mobility Bed Mobility Bed Mobility: Supine to Sit Supine to Sit: 7: Independent Transfers Sit to Stand: 7: Independent;With upper extremity assist;From bed Stand to Sit: 7: Independent;With upper extremity assist;With armrests;To chair/3-in-1  Shoulder Instructions     Exercise     Balance     End of Session    GO Functional Assessment Tool  Used: clinical judgement/observation Functional Limitation: Self care Self Care Current Status (O1308): At least 1 percent but less than 20 percent impaired, limited or restricted Self Care Goal Status (M5784): 0 percent impaired, limited or restricted   Phillip Kemp 09/25/2012, 10:12 AM Phillip Kemp, OTR/L [phone] 09/25/2012

## 2012-09-25 NOTE — Evaluation (Signed)
Physical Therapy Evaluation Patient Details Name: Phillip Kemp MRN: [id number] DOB: 1961/04/04 Today's Date: 09/25/2012 Time: 4098-1191 PT Time Calculation (min): 16 min  PT Assessment / Plan / Recommendation Clinical Impression  Pt presents with chest pain, nausea, diarrhea and dizziness causing patient to pass out yesterday before coming to ED.  Pt states that he has been receiving fluids which are helping with the dizziness, however he mentioned that he has chronic anemia.  Pt will benefit from skilled PT in acute venue to address deficits.  Pt is to D/C to [place] [facility] to receive therapy for ETOH abuse, therefore will not require any further f/u.      PT Assessment  Patient needs continued PT services    Follow Up Recommendations  No PT follow up    Does the patient have the potential to tolerate intense rehabilitation      Barriers to Discharge Decreased caregiver support      Equipment Recommendations  None recommended by PT    Recommendations for Other Services     Frequency Min 3X/week    Precautions / Restrictions Precautions Precautions: Fall Restrictions Weight Bearing Restrictions: No   Pertinent Vitals/Pain No pain      Mobility  Bed Mobility Bed Mobility: Supine to Sit Supine to Sit: 7: Independent Transfers Transfers: Sit to Stand;Stand to Sit Sit to Stand: 7: Independent;With upper extremity assist;From bed Stand to Sit: 7: Independent;With upper extremity assist;With armrests;To chair/3-in-1 Ambulation/Gait Ambulation/Gait Assistance: 4: Min guard Ambulation Distance (Feet): 200 Feet Assistive device: None;Other (Comment) Ambulation/Gait Assistance Details: Used IV pole intermittently for stability.  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  Noted that pt is somewhat unsteady with gait.  Gait Pattern: Step-through pattern Gait velocity: WFL Stairs: No Wheelchair  Mobility Wheelchair Mobility: No    Shoulder Instructions     Exercises     PT Diagnosis: Difficulty walking;Generalized weakness  PT Problem List: Decreased strength;Decreased activity tolerance;Decreased balance;Decreased mobility PT Treatment Interventions: DME instruction;Gait training;Stair training;Functional mobility training;Therapeutic activities;Therapeutic exercise;Balance training;Patient/family education   PT Goals Acute Rehab PT Goals PT Goal Formulation: With patient Time For Goal Achievement: 10/02/12 Potential to Achieve Goals: Good Pt will Ambulate: >150 feet;Independently PT Goal: Ambulate - Progress: Goal set today Pt will Go Up / Down Stairs: 3-5 stairs;with modified independence;with rail(s) PT Goal: Up/Down Stairs - Progress: Goal set today Pt will Perform Home Exercise Program: with supervision, verbal cues required/provided PT Goal: Perform Home Exercise Program - Progress: Goal set today  Visit Information  Last PT Received On: 09/25/12 Assistance Needed: +1    Subjective Data  Subjective: My balance is off because I only have three toes on my left foot.  Patient Stated Goal: to go to get rehab for my drinking   Prior Functioning  Home Living Lives With: Other (Comment) (pt is homeless) Available Help at Discharge: Other (Comment) (Trying to get into [facility]. ) Type of Home: Homeless Home Adaptive Equipment: None Prior Function Level of Independence: Independent Able to Take Stairs?: Yes Driving: No Vocation: Unemployed Communication Communication: No difficulties    Cognition  Overall Cognitive Status: Appears within functional limits for tasks assessed/performed Arousal/Alertness: Awake/alert Orientation Level: Appears intact for tasks assessed Behavior During Session: [facility] for tasks performed    Extremity/Trunk Assessment Right Lower Extremity Assessment RLE ROM/Strength/Tone: [facility] for tasks assessed RLE Sensation: WFL - Light  Touch RLE Coordination: WFL - gross/fine motor Left Lower Extremity  Assessment LLE ROM/Strength/Tone: WFL for tasks assessed LLE Sensation: WFL - Light Touch LLE Coordination: WFL - gross/fine motor Trunk Assessment Trunk Assessment: Normal   Balance    End of Session PT - End of Session Activity Tolerance: Patient tolerated treatment well Patient left: in chair;with call bell/phone within reach Nurse Communication: Mobility status  GP Functional Assessment Tool Used: Clinical judgement Functional Limitation: Mobility: Walking and moving around Mobility: Walking and Moving Around Current Status (N8295): At least 1 percent but less than 20 percent impaired, limited or restricted Mobility: Walking and Moving Around Goal Status [phone]): 0 percent impaired, limited or restricted   Lessie Dings 09/25/2012, 9:55 AM

## 2012-09-25 NOTE — Progress Notes (Signed)
*  PRELIMINARY RESULTS* Echocardiogram 2D Echocardiogram has been performed.  Phillip Kemp 09/25/2012, 8:47 AM

## 2012-09-25 NOTE — Consult Note (Signed)
Reason for Consult: chest pain, recent discharge from [facility] Referring Physician: unknown  Phillip Kemp is an 51 y.o. male.  HPI: Patient is a 51 year old African American male with past medical history of alcohol abuse, cocaine abuse who admitted with chest pain. Patient was recently discharged from the behavioral health center when he was admitted there with substance abuse.    he was using cocaine for 2 days (Monday and Tuesday), subsequently started having chest pain midsternal for last 2 days. Patient also endorsed having nausea, dizziness, and lightheadedness and shortness of breath with exertion. He feels short of breath after walking 2 blocks. He felt very weak but no focal weakness and hence presented to the ED. He also reports having some diarrhea and has noticed some blood in the stools but then also takes iron supplementation. Patient on endorsed having beer after the discharge from [facility]. Patient states that he was scheduled to go to [facility] facility on 09/23/2012 but they told him that his bed was not ready till 10/03/2012.    Seen today and his chart was reviewed. Reports no SI, HI or AVH. Wants to quit drugs and wants to go to [facility] now.   Past Medical History  Diagnosis Date  . Arthritis   . Substance abuse     Cocaine, marijuana, and alcohol  . Suicidal ideation 09/09/2012  . Chronic blood loss anemia 09/09/2012  . Rectal bleeding 09/08/2012  . Acute alcoholic pancreatitis 09/08/2012    Past Surgical History  Procedure Date  . Orthopedic surgery     Left foot surgery    Family History  Problem Relation Age of Onset  . Heart disease Mother   . Cancer Father     prostate    Social History:  reports that he has been smoking Cigarettes.  He has been smoking about .5 packs per day. He has never used smokeless tobacco. He reports that he drinks about 28.8 ounces of alcohol per week. He reports that he uses illicit drugs (Cocaine).  Allergies: No Known  Allergies  Medications: I have reviewed the patient's current medications.  Results for orders placed during the hospital encounter of 09/24/12 (from the past 48 hour(s))  URINALYSIS, ROUTINE W REFLEX MICROSCOPIC     Status: Abnormal   Collection Time   09/24/12  2:00 PM      Component Value Range Comment   Color, Urine AMBER (*) YELLOW BIOCHEMICALS MAY BE AFFECTED BY COLOR   APPearance CLOUDY (*) CLEAR    Specific Gravity, Urine 1.025  1.005 - 1.030    pH 5.5  5.0 - 8.0    Glucose, UA NEGATIVE  NEGATIVE mg/dL    Hgb urine dipstick NEGATIVE  NEGATIVE    Bilirubin Urine SMALL (*) NEGATIVE    Ketones, ur TRACE (*) NEGATIVE mg/dL    Protein, ur NEGATIVE  NEGATIVE mg/dL    Urobilinogen, UA 1.0  0.0 - 1.0 mg/dL    Nitrite NEGATIVE  NEGATIVE    Leukocytes, UA MODERATE (*) NEGATIVE   URINE RAPID DRUG SCREEN (HOSP PERFORMED)     Status: Abnormal   Collection Time   09/24/12  2:00 PM      Component Value Range Comment   Opiates NONE DETECTED  NONE DETECTED    Cocaine POSITIVE (*) NONE DETECTED    Benzodiazepines POSITIVE (*) NONE DETECTED    Amphetamines NONE DETECTED  NONE DETECTED    Tetrahydrocannabinol NONE DETECTED  NONE DETECTED    Barbiturates NONE DETECTED  NONE DETECTED   URINE MICROSCOPIC-ADD ON     Status: Abnormal   Collection Time   09/24/12  2:00 PM      Component Value Range Comment   Squamous Epithelial / LPF RARE  RARE    WBC, UA 7-10  <3 WBC/hpf    RBC / HPF 0-2  <3 RBC/hpf    Bacteria, UA RARE  RARE    Casts HYALINE CASTS (*) NEGATIVE    Crystals CA OXALATE CRYSTALS (*) NEGATIVE   CBC WITH DIFFERENTIAL     Status: Abnormal   Collection Time   09/24/12  2:25 PM      Component Value Range Comment   WBC 12.6 (*) 4.0 - 10.5 K/uL    RBC 4.41  4.22 - 5.81 MIL/uL    Hemoglobin 11.7 (*) 13.0 - 17.0 g/dL    HCT 04.5 (*) 40.9 - 52.0 %    MCV 77.1 (*) 78.0 - 100.0 fL    MCH 26.5  26.0 - 34.0 pg    MCHC 34.4  30.0 - 36.0 g/dL    RDW 81.1  91.4 - 78.2 %    Platelets  153  150 - 400 K/uL    Neutrophils Relative 81 (*) 43 - 77 %    Neutro Abs 10.3 (*) 1.7 - 7.7 K/uL    Lymphocytes Relative 13  12 - 46 %    Lymphs Abs 1.6  0.7 - 4.0 K/uL    Monocytes Relative 6  3 - 12 %    Monocytes Absolute 0.7  0.1 - 1.0 K/uL    Eosinophils Relative 0  0 - 5 %    Eosinophils Absolute 0.0  0.0 - 0.7 K/uL    Basophils Relative 0  0 - 1 %    Basophils Absolute 0.0  0.0 - 0.1 K/uL   COMPREHENSIVE METABOLIC PANEL     Status: Abnormal   Collection Time   09/24/12  2:25 PM      Component Value Range Comment   Sodium 134 (*) 135 - 145 mEq/L    Potassium 4.0  3.5 - 5.1 mEq/L    Chloride 97  96 - 112 mEq/L    CO2 22  19 - 32 mEq/L    Glucose, Bld 97  70 - 99 mg/dL    BUN 22  6 - 23 mg/dL    Creatinine, Ser 9.56  0.50 - 1.35 mg/dL    Calcium 21.3  8.4 - 10.5 mg/dL    Total Protein 7.0  6.0 - 8.3 g/dL    Albumin 3.0 (*) 3.5 - 5.2 g/dL    AST 25  0 - 37 U/L    ALT 37  0 - 53 U/L    Alkaline Phosphatase 119 (*) 39 - 117 U/L    Total Bilirubin 0.6  0.3 - 1.2 mg/dL    GFR calc non Af Amer >90  >90 mL/min    GFR calc Af Amer >90  >90 mL/min   ETHANOL     Status: Normal   Collection Time   09/24/12  2:25 PM      Component Value Range Comment   Alcohol, Ethyl (B) <11  0 - 11 mg/dL   LIPASE, BLOOD     Status: Normal   Collection Time   09/24/12  2:25 PM      Component Value Range Comment   Lipase 24  11 - 59 U/L   TROPONIN I     Status: Normal  Collection Time   09/24/12  2:25 PM      Component Value Range Comment   Troponin I <0.30  <0.30 ng/mL   D-DIMER, QUANTITATIVE     Status: Abnormal   Collection Time   09/24/12  3:59 PM      Component Value Range Comment   D-Dimer, Quant 0.98 (*) 0.00 - 0.48 ug/mL-FEU   TROPONIN I     Status: Normal   Collection Time   09/24/12  6:15 PM      Component Value Range Comment   Troponin I <0.30  <0.30 ng/mL   CK TOTAL AND CKMB     Status: Normal   Collection Time   09/24/12  6:15 PM      Component Value Range Comment    Total CK 23  7 - 232 U/L    CK, MB 1.3  0.3 - 4.0 ng/mL    Relative Index RELATIVE INDEX IS INVALID  0.0 - 2.5   TSH     Status: Abnormal   Collection Time   09/24/12  6:15 PM      Component Value Range Comment   TSH <0.008 (*) 0.350 - 4.500 uIU/mL   OCCULT BLOOD X 1 CARD TO LAB, STOOL     Status: Normal   Collection Time   09/25/12 12:21 AM      Component Value Range Comment   Fecal Occult Bld NEGATIVE     CLOSTRIDIUM DIFFICILE BY PCR     Status: Normal   Collection Time   09/25/12 12:22 AM      Component Value Range Comment   C difficile by pcr NEGATIVE  NEGATIVE   TROPONIN I     Status: Normal   Collection Time   09/25/12 12:30 AM      Component Value Range Comment   Troponin I <0.30  <0.30 ng/mL   CK TOTAL AND CKMB     Status: Normal   Collection Time   09/25/12 12:30 AM      Component Value Range Comment   Total CK 24  7 - 232 U/L    CK, MB 1.4  0.3 - 4.0 ng/mL    Relative Index RELATIVE INDEX IS INVALID  0.0 - 2.5   TROPONIN I     Status: Normal   Collection Time   09/25/12  5:44 AM      Component Value Range Comment   Troponin I <0.30  <0.30 ng/mL   CK TOTAL AND CKMB     Status: Normal   Collection Time   09/25/12  5:44 AM      Component Value Range Comment   Total CK 25  7 - 232 U/L    CK, MB 1.4  0.3 - 4.0 ng/mL    Relative Index RELATIVE INDEX IS INVALID  0.0 - 2.5   BASIC METABOLIC PANEL     Status: Abnormal   Collection Time   09/25/12  5:44 AM      Component Value Range Comment   Sodium 136  135 - 145 mEq/L    Potassium 3.7  3.5 - 5.1 mEq/L    Chloride 103  96 - 112 mEq/L    CO2 24  19 - 32 mEq/L    Glucose, Bld 107 (*) 70 - 99 mg/dL    BUN 23  6 - 23 mg/dL    Creatinine, Ser 1.61  0.50 - 1.35 mg/dL    Calcium 9.6  8.4 - 09.6 mg/dL  GFR calc non Af Amer >90  >90 mL/min    GFR calc Af Amer >90  >90 mL/min   CBC     Status: Abnormal   Collection Time   09/25/12  5:44 AM      Component Value Range Comment   WBC 5.2  4.0 - 10.5 K/uL    RBC 3.95  (*) 4.22 - 5.81 MIL/uL    Hemoglobin 10.4 (*) 13.0 - 17.0 g/dL    HCT 72.5 (*) 36.6 - 52.0 %    MCV 76.7 (*) 78.0 - 100.0 fL    MCH 26.3  26.0 - 34.0 pg    MCHC 34.3  30.0 - 36.0 g/dL    RDW 44.0  34.7 - 42.5 %    Platelets 141 (*) 150 - 400 K/uL     Dg Chest 2 View  09/24/2012  *RADIOLOGY REPORT*  Clinical Data: Chest pain.  CHEST - 2 VIEW  Comparison: 10/20/2011.  Findings: The heart, mediastinum and hilar contours are normal. The lungs are clear and fully expanded.  No edema or consolidation. No effusions or pneumothoraces.  The chest wall is normal.  IMPRESSION: No active disease.   Original Report Authenticated By: Mervin Hack, M.D.    Ct Angio Chest Pe W/cm &/or Wo Cm  09/24/2012  *RADIOLOGY REPORT*  Clinical Data: Pulmonary embolism.  Chest pain.  History of substance abuse and anemia.  CT ANGIOGRAPHY CHEST  Technique:  Multidetector CT imaging of the chest using the standard protocol during bolus administration of intravenous contrast. Multiplanar reconstructed images including MIPs were obtained and reviewed to evaluate the vascular anatomy.  Contrast: OMNIPAQUE IOHEXOL 350 MG/ML SOLN  Comparison: 10/06/2009.Abdominal CT 09/08/2012.  Findings: Bilateral gynecomastia is noted.  Prominent anterior mediastinal soft tissue is present, which is new since the prior exam.  This is in the region of the thymus.  The study is technically adequate for evaluation of pulmonary embolus.  No pulmonary embolism is present.  Shotty bilateral axillary lymph nodes are present with mildly enlarged left axillary lymph node measuring 11 mm short axis.  Diffuse enlargement of the thyroid suggesting goiter.  No mediastinal or hilar adenopathy. Heart grossly normal.  Three-vessel aortic arch.  Incidental imaging the upper abdomen demonstrates an indeterminant left adrenal nodule measuring 3 cm x 2.4 cm. Nodule appears unchanged compared to recent abdominal CT.  Hypervascular liver lesions are present.  In the lateral right hepatic lobe, there is a hypervascular lesion identified on recent CT.  There is also a hypervascular lesion is now seen in the posterior right hepatic lobe measuring 4 cm x 2.8 cm.  These lesions are nonspecific.  These were  not visualized on prior CT chest 10/06/2009.  No aggressive osseous lesions are identified.  The lungs appear clear.  IMPRESSION: 1.  Negative for pulmonary embolus.  No acute abnormality of the chest. 2.  Abnormal anterior mediastinal soft tissue.  The differential considerations include thymoma, substernal thyroid, teratoma, lymphoma, thymic hyperplasia. 3. Nonspecific mild left axillary adenopathy with 11 mm lymph node with a fatty hilum. 4.  Hypervascular liver lesions.  Follow-up abdominal MRI recommended with and without infusion after acute illness. Non- emergent MRI should be deferred until patient has been discharged for the acute illness, and can optimally cooperate with positioning and breath-holding instructions. 5.  Unchanged left adrenal mass.  Again, follow-up MRI is recommended.   Original Report Authenticated By: Andreas Newport, M.D.     ROS Blood pressure 123/88, pulse 96, temperature  98.3 F (36.8 C), temperature source Oral, resp. rate 20, height 6\' 1"  (1.854 m), weight 59.829 kg (131 lb 14.4 oz), SpO2 98.00%. Physical Exam    Mental Status Examination/Evaluation:  Appearance: on bed  Eye Contact:: Good  Speech: normal  Volume: Normal  Mood: dok  Affect: ristricted  Thought Process: organized  Orientation: Full  Thought Content: NO AVH  Suicidal Thoughts: No  Homicidal Thoughts: no  Memory: Recent; Poor  Judgement: Impaired  Insight: Lacking  Psychomotor Activity: Normal  Concentration: Fair  Recall: Fair  Akathisia: No  Assessment:  AXIS I: Polysubstance Dep  AXIS II: Deferred  AXIS III: see emdical hx ? ?  ? ?  ?  ? ?  ?  ?  AXIS IV: problems related to social environment, problems with  access to health care services and problems with primary support group  AXIS V: 50  ?  Treatment Plan/Recommendations:   1. will recommend in pt drug rehab. Pt agreed. SW can help to find a bed earlier if possible. Pt is willing to wait if needed. Will recommend to observe with ETOh withdrawal, folate and thiamine  2. Was educated about the risks if he continues to use drugs today  3. Will continue to follow as needed  Wonda Cerise 09/25/2012, 5:46 PM

## 2012-09-25 NOTE — Progress Notes (Signed)
[facility] PROGRESS NOTE  Phillip Kemp:[id number] DOB: 1961-02-27 DOA: 09/24/2012 PCP: Milinda Antis, MD  Assessment/Plan: 1-chest pain: Most likely secondary to cocaine abuse. Cardiac enzymes negative x3 and 2-D echo done at this moment but with results pending. Will follow 2-D echo results, continue aspirin and PRN nitroglycerin. Patient received counseling regarding cocaine abuse and is willing to go for inpatient rehabilitation. CT angiogram of the chest is negative for pulmonary embolism.  2-substance abuse: Including cocaine and alcohol. -Will attempt inpatient rehabilitation as recommended by psychiatry. (Social work consult in place) -thiamine and folic acid -CIWA  3-hypertension: Most likely secondary to recent use of cocaine. Without any medication blood pressure within normal limits at this point. Will continue monitoring.  4-left at renal mass: Non-emergent MRI to be done as an outpatient for followup.  5-depression: Stable. At this moment the plan is to continue trazodone and Celexa. No suicidal ideation.  DVT: SCDs   Code Status: Full Family Communication: no family at bedside Disposition Plan: will try to discharge to inpatient rehab for substance abuse if possible; patient is in agreement.   Consultants: Psych  Procedures:  2-D echo pending   HPI/Subjective: Afebrile; denies CP and dyspnea.  Objective: Filed Vitals:   09/24/12 1725 09/24/12 2042 09/25/12 0617 09/25/12 1435  BP: 120/53 108/48 103/56 123/88  Pulse: 109 102 87 96  Temp: 98.3 F (36.8 C) 98.3 F (36.8 C) 98.2 F (36.8 C) 98.3 F (36.8 C)  TempSrc: Oral Oral Oral Oral  Resp: 20 20 18 20   Height: 6\' 1"  (1.854 m)     Weight: 59.829 kg (131 lb 14.4 oz)     SpO2: 100% 100% 100% 98%    Intake/Output Summary (Last 24 hours) at 09/25/12 1758 Last data filed at 09/25/12 1300  Gross per 24 hour  Intake    840 ml  Output    475 ml  Net    365 ml   Filed Weights   09/24/12 1725  Weight: 59.829 kg (131 lb 14.4 oz)    Exam:   General:  NAD; afebrile  Cardiovascular: s1 ann s2; no rubs or gallops  Respiratory: CTA bilaterally  Abdomen: soft, NT, ND and with positive BS  Extremities: no edema  Neuro: non focal  Data Reviewed: Basic Metabolic Panel:  Lab 09/25/12 8119 09/24/12 1425  NA 136 134*  K 3.7 4.0  CL 103 97  CO2 24 22  GLUCOSE 107* 97  BUN 23 22  CREATININE 0.66 0.78  CALCIUM 9.6 10.4  MG -- --  PHOS -- --   Liver Function Tests:  Lab 09/24/12 1425  AST 25  ALT 37  ALKPHOS 119*  BILITOT 0.6  PROT 7.0  ALBUMIN 3.0*    Lab 09/24/12 1425  LIPASE 24  AMYLASE --   CBC:  Lab 09/25/12 0544 09/24/12 1425  WBC 5.2 12.6*  NEUTROABS -- 10.3*  HGB 10.4* 11.7*  HCT 30.3* 34.0*  MCV 76.7* 77.1*  PLT 141* 153   Cardiac Enzymes:  Lab 09/25/12 0544 09/25/12 0030 09/24/12 1815 09/24/12 1425  CKTOTAL 25 24 23  --  CKMB 1.4 1.4 1.3 --  CKMBINDEX -- -- -- --  TROPONINI <0.30 <0.30 <0.30 <0.30     Recent Results (from the past 240 hour(s))  CLOSTRIDIUM DIFFICILE BY PCR     Status: Normal   Collection Time   09/25/12 12:22 AM      Component Value Range Status Comment   C difficile by pcr NEGATIVE  NEGATIVE  Final      Studies: Dg Chest 2 View  09/24/2012  *RADIOLOGY REPORT*  Clinical Data: Chest pain.  CHEST - 2 VIEW  Comparison: 10/20/2011.  Findings: The heart, mediastinum and hilar contours are normal. The lungs are clear and fully expanded.  No edema or consolidation. No effusions or pneumothoraces.  The chest wall is normal.  IMPRESSION: No active disease.   Original Report Authenticated By: Mervin Hack, M.D.    Ct Angio Chest Pe W/cm &/or Wo Cm  09/24/2012  *RADIOLOGY REPORT*  Clinical Data: Pulmonary embolism.  Chest pain.  History of substance abuse and anemia.  CT ANGIOGRAPHY CHEST  Technique:  Multidetector CT imaging of the chest using the standard protocol during bolus administration of  intravenous contrast. Multiplanar reconstructed images including MIPs were obtained and reviewed to evaluate the vascular anatomy.  Contrast: OMNIPAQUE IOHEXOL 350 MG/ML SOLN  Comparison: 10/06/2009.Abdominal CT 09/08/2012.  Findings: Bilateral gynecomastia is noted.  Prominent anterior mediastinal soft tissue is present, which is new since the prior exam.  This is in the region of the thymus.  The study is technically adequate for evaluation of pulmonary embolus.  No pulmonary embolism is present.  Shotty bilateral axillary lymph nodes are present with mildly enlarged left axillary lymph node measuring 11 mm short axis.  Diffuse enlargement of the thyroid suggesting goiter.  No mediastinal or hilar adenopathy. Heart grossly normal.  Three-vessel aortic arch.  Incidental imaging the upper abdomen demonstrates an indeterminant left adrenal nodule measuring 3 cm x 2.4 cm. Nodule appears unchanged compared to recent abdominal CT.  Hypervascular liver lesions are present. In the lateral right hepatic lobe, there is a hypervascular lesion identified on recent CT.  There is also a hypervascular lesion is now seen in the posterior right hepatic lobe measuring 4 cm x 2.8 cm.  These lesions are nonspecific.  These were  not visualized on prior CT chest 10/06/2009.  No aggressive osseous lesions are identified.  The lungs appear clear.  IMPRESSION: 1.  Negative for pulmonary embolus.  No acute abnormality of the chest. 2.  Abnormal anterior mediastinal soft tissue.  The differential considerations include thymoma, substernal thyroid, teratoma, lymphoma, thymic hyperplasia. 3. Nonspecific mild left axillary adenopathy with 11 mm lymph node with a fatty hilum. 4.  Hypervascular liver lesions.  Follow-up abdominal MRI recommended with and without infusion after acute illness. Non- emergent MRI should be deferred until patient has been discharged for the acute illness, and can optimally cooperate with positioning and  breath-holding instructions. 5.  Unchanged left adrenal mass.  Again, follow-up MRI is recommended.   Original Report Authenticated By: Andreas Newport, M.D.     Scheduled Meds:   . aspirin EC  325 mg Oral Daily  . citalopram  20 mg Oral Daily  . ferrous sulfate  325 mg Oral Daily  . pantoprazole  40 mg Oral Daily  . sodium chloride  500 mL Intravenous Once  . sodium chloride  3 mL Intravenous Q12H   Continuous Infusions:   . sodium chloride      Time spent: >30 minutes    Rogelio Waynick  [facility] Pager [phone]. If 8PM-8AM, please contact night-coverage at [email or web address], password [facility] 09/25/2012, 5:58 PM  LOS: 1 day

## 2012-09-26 LAB — URINE CULTURE: Colony Count: 6000

## 2012-09-26 LAB — FECAL LACTOFERRIN, QUANT

## 2012-09-26 MED ORDER — ENSURE COMPLETE PO LIQD: 237.0000 mL | Freq: Two times a day (BID) | ORAL | Status: DC

## 2012-09-26 MED ORDER — ENSURE COMPLETE PO LIQD
237.0000 mL | Freq: Two times a day (BID) | ORAL | Status: DC
Start: 2012-09-26 — End: 2012-09-26

## 2012-09-26 MED ORDER — ASPIRIN EC 81 MG PO TBEC: 81.0000 mg | DELAYED_RELEASE_TABLET | Freq: Every day | ORAL | Status: DC

## 2012-09-26 MED ORDER — THERA VITAL M PO TABS: 1.0000 | ORAL_TABLET | Freq: Every day | ORAL | Status: DC

## 2012-09-26 NOTE — Progress Notes (Signed)
CSW left a message with the [facility] with contact information to attempt to place Pt at that facility until his placement at the [facility] program.    Leron Croak, LCSWA [place] [facility] Weekend Coverage [phone]

## 2012-09-26 NOTE — Progress Notes (Signed)
Clinical Social Work Department BRIEF PSYCHOSOCIAL ASSESSMENT 09/26/2012  Patient:  Phillip Kemp, Phillip Kemp     Account Number:  [id number]     Admit date:  09/24/2012  Clinical Social Worker:  Leron Croak, CLINICAL SOCIAL WORKER  Date/Time:  09/26/2012 09:59 AM  Referred by:  Physician  Date Referred:  09/25/2012 Referred for  Substance Abuse  Homelessness   Other Referral:   Interview type:   Other interview type:    PSYCHOSOCIAL DATA Living Status:  OTHER Admitted from facility:   Level of care:   Primary support name:  Phillip Kemp Primary support relationship to patient:  PARENT Degree of support available:   Pt was limited support and is homeless at the present    CURRENT CONCERNS Current Concerns  Other - See comment   Other Concerns:   Client needs assistance with a homeless shelter in the interum until Pt is able to enter the residential [facility] program    SOCIAL WORK ASSESSMENT / PLAN CSW met with the Pt at the bedside. Pt was in goos spirits. Pt is wanting to see if CSW can locate a homeless shelter (preferably the [employer]) until he begins with [facility] substance abuse treatment program. Client is modivated for treatment and does not want to "go back on the streets and use". CSW will search for possible palcement options, however CSW may not find any shelters available on the weekend. CSW explained this to the Pt and he verbalized understanding.   Assessment/plan status:  Information/Referral to [facility] Other assessment/ plan:   Information/referral to community resources:   None needed at this time    PATIENT'S/FAMILY'S RESPONSE TO PLAN OF CARE: Pt appreciative for assistance and motivated toward treatment     Leron Croak, Judie [facility] Weekend Coverage [phone]

## 2012-09-26 NOTE — Discharge Summary (Signed)
Physician Discharge Summary  Phillip Kemp WUJ:[id number] DOB: February 14, 1961 DOA: 09/24/2012  PCP: Milinda Antis, MD  Admit date: 09/24/2012 Discharge date: 09/26/2012   Discharge Diagnoses:  Principal Problem:  *Chest pain, midsternal Active Problems:  Substance abuse  Cocaine abuse  Anemia  Left adrenal mass  Depression   Discharge Condition: stable and improved. No dyspnea or CP. Patient will start [facility] inpatient rehab for substance abuse on 10-03-12  Diet recommendation: heart healthy diet  Filed Weights   09/24/12 1725  Weight: 59.829 kg (131 lb 14.4 oz)    History of present illness:  Patient is a 51 year old African American male with past medical history of alcohol abuse, cocaine abuse who presented to ED which has been in the last 2 days. Patient was recently discharged from the behavioral health center when he was admitted there with substance abuse. History is obtained from the patient who admitted that he was using cocaine for 2 days (Monday and Tuesday), subsequently started having chest pain midsternal for last 2 days. Patient also endorsed having nausea, dizziness, and lightheadedness and shortness of breath with exertion. He feels short of breath after walking 2 blocks. He felt very weak but no focal weakness and hence presented to the ED. He also reports having some diarrhea and has noticed some blood in the stools but then also takes iron supplementation. Patient on endorsed having beer after the discharge from [facility]. Patient states that he was scheduled to go to [facility] facility on 09/23/2012 but they told him that his bed was not ready till 10/03/2012.    Hospital Course:  1-chest pain: Most likely secondary to cocaine abuse. Cardiac enzymes negative x3 and 2-D echo without wall motion abnormalities, with preserved EF and no signs of heart failure. CT angiogram of the chest is negative for pulmonary embolism. Patient advised to quit using cocaine and to take  medications as prescribed. He will continue using protonix as well for any contribution of GERD to his CP.  2-substance abuse: Including cocaine and alcohol.  -Patient will start  inpatient rehabilitation as recommended by psychiatry, at [facility] on 10-20/13. -Advised and counseled to stop drinking and using cocaine. -Advised to use daily MV for supplementations of thiamine and folic acid.  -CIWA protocol implemented during admission w/o any signs of withdrawal.  3-hypertension: Most likely secondary to recent use of cocaine. Without any medication, blood pressure within normal limits at this point. Will continue monitoring. And will recommend to stop cocaine, alcohol and to follow a heart healthy diet.  4-left at renal mass: Non-emergent MRI to be done as an outpatient for followup.   5-depression: Stable. At this moment the plan is to continue trazodone and Celexa. No suicidal ideation.   Procedures:  2-D echo (- Left ventricle: The cavity size was normal. Systolic function was normal. The estimated ejection fraction was in the range of 55% to 60%. Wall motion was normal; there were no regional wall motion abnormalities. - Atrial septum: Reduncant atrial septum No defect or patent foramen ovale was identified.   Consultations:  Psych  SW  Discharge Exam: Filed Vitals:   09/25/12 0617 09/25/12 1435 09/26/12 0020 09/26/12 0500  BP: 103/56 123/88 127/71 126/61  Pulse: 87 96 96 84  Temp: 98.2 F (36.8 C) 98.3 F (36.8 C) 98.5 F (36.9 C) 98.2 F (36.8 C)  TempSrc: Oral Oral Oral Oral  Resp: 18 20 20 16   Height:      Weight:      SpO2:  100% 98% 99% 100%    General: NAD, afebrile; no dyspnea or CP Cardiovascular: S1 and S2; no rubs, no gallops or murmurs Respiratory: CTA bilaterally Abdomen: soft, NT, ND; positive BS Extremities: no edema, no cyanosis Neuro: non focal  Discharge Instructions  Discharge Orders    Future Appointments: Provider: Department: Dept  Phone: Center:   10/04/2012 11:00 AM Nira Retort, NP [facility] [phone] [facility]     Future Orders Please Complete By Expires   Discharge instructions      Comments:   -Stop alcohol -Stop cocaine -Follow discharge medications and instructions as prescribed -Follow as previously schedule with [facility] for inpatient rehabilitation       Medication List     As of 09/26/2012 12:20 PM    STOP taking these medications         pseudoephedrine 120 MG 12 hr tablet   Commonly known as: SUDAFED      TAKE these medications         aspirin EC 81 MG tablet   Take 1 tablet (81 mg total) by mouth daily.      citalopram 20 MG tablet   Commonly known as: CELEXA   Take 1 tablet (20 mg total) by mouth daily.      ferrous sulfate 325 (65 FE) MG tablet   Take 325 mg by mouth daily. For chronic anemia      multivitamin tablet   Take 1 tablet by mouth daily.      pantoprazole 40 MG tablet   Commonly known as: PROTONIX   Take 1 tablet (40 mg total) by mouth daily. For gerd.      traZODone 100 MG tablet   Commonly known as: DESYREL   Take 1 tablet (100 mg total) by mouth at bedtime as needed and may repeat dose one time if needed for sleep. For insomnia           Follow-up Information    Follow up with [facility]. On 10/03/2012.          The results of significant diagnostics from this hospitalization (including imaging, microbiology, ancillary and laboratory) are listed below for reference.    Significant Diagnostic Studies: Dg Chest 2 View  09/24/2012  *RADIOLOGY REPORT*  Clinical Data: Chest pain.  CHEST - 2 VIEW  Comparison: 10/20/2011.  Findings: The heart, mediastinum and hilar contours are normal. The lungs are clear and fully expanded.  No edema or consolidation. No effusions or pneumothoraces.  The chest wall is normal.  IMPRESSION: No active disease.   Original Report Authenticated By: Mervin Hack, M.D.    Ct Angio Chest Pe W/cm &/or Wo  Cm  09/24/2012  *RADIOLOGY REPORT*  Clinical Data: Pulmonary embolism.  Chest pain.  History of substance abuse and anemia.  CT ANGIOGRAPHY CHEST  Technique:  Multidetector CT imaging of the chest using the standard protocol during bolus administration of intravenous contrast. Multiplanar reconstructed images including MIPs were obtained and reviewed to evaluate the vascular anatomy.  Contrast: OMNIPAQUE IOHEXOL 350 MG/ML SOLN  Comparison: 10/06/2009.Abdominal CT 09/08/2012.  Findings: Bilateral gynecomastia is noted.  Prominent anterior mediastinal soft tissue is present, which is new since the prior exam.  This is in the region of the thymus.  The study is technically adequate for evaluation of pulmonary embolus.  No pulmonary embolism is present.  Shotty bilateral axillary lymph nodes are present with mildly enlarged left axillary lymph node measuring 11 mm short axis.  Diffuse  enlargement of the thyroid suggesting goiter.  No mediastinal or hilar adenopathy. Heart grossly normal.  Three-vessel aortic arch.  Incidental imaging the upper abdomen demonstrates an indeterminant left adrenal nodule measuring 3 cm x 2.4 cm. Nodule appears unchanged compared to recent abdominal CT.  Hypervascular liver lesions are present. In the lateral right hepatic lobe, there is a hypervascular lesion identified on recent CT.  There is also a hypervascular lesion is now seen in the posterior right hepatic lobe measuring 4 cm x 2.8 cm.  These lesions are nonspecific.  These were  not visualized on prior CT chest 10/06/2009.  No aggressive osseous lesions are identified.  The lungs appear clear.  IMPRESSION: 1.  Negative for pulmonary embolus.  No acute abnormality of the chest. 2.  Abnormal anterior mediastinal soft tissue.  The differential considerations include thymoma, substernal thyroid, teratoma, lymphoma, thymic hyperplasia. 3. Nonspecific mild left axillary adenopathy with 11 mm lymph node with a fatty hilum. 4.   Hypervascular liver lesions.  Follow-up abdominal MRI recommended with and without infusion after acute illness. Non- emergent MRI should be deferred until patient has been discharged for the acute illness, and can optimally cooperate with positioning and breath-holding instructions. 5.  Unchanged left adrenal mass.  Again, follow-up MRI is recommended.   Original Report Authenticated By: Andreas Newport, M.D.    Ct Abdomen Pelvis W Contrast  09/08/2012  *RADIOLOGY REPORT*  Clinical Data: Ethanol abuse and crack cocaine abuse.  Bloody stools.  Abdominal pain.  CT ABDOMEN AND PELVIS WITH CONTRAST  Technique:  Multidetector CT imaging of the abdomen and pelvis was performed following the standard protocol during bolus administration of intravenous contrast.  Contrast: OMNIPAQUE IOHEXOL 300 MG/ML  SOLN  Comparison: 10/21/2009.  Findings: Lung Bases: Mild dependent atelectasis in the lungs. Suggestion of mild right heart enlargement.  Liver:  Tiny hypervascular lesion in the right hepatic lobe (image 25 series 2) which is not present on delayed imaging compatible with a small flash fill hemangioma.  Fatty infiltration adjacent to the falciform ligament of the liver.  No focal mass lesion.  Spleen:  Normal.  Gallbladder:  Contracted.  No calcified stones.  Common bile duct:  Mildly enlarged for age, probably due to prior passage of stones.  No calcified common duct stones. Recommend correlation with bilirubin.  Pancreas:  Normal.  Adrenal glands:  There is a left adrenal mass measuring 33 mm x 19 mm.  Follow-up adrenal MRI recommended for further characterization.  Differential considerations include adenoma, metastatic disease or primary adrenal neoplasm.  This appears distinct from the posterior gastric fundus and gastric diverticulum is considered unlikely.  Kidneys:  Normal renal enhancement and excretion.  Stomach:  Normal.  Small bowel:  No obstruction.  No inflammatory changes.  No mesenteric adenopathy.   Colon:   Normal appendix.  No colonic inflammatory changes.  High position of the cecum.  Pelvic Genitourinary:  Prostatic calcifications and prostatomegaly. No free fluid in the pelvis.  Urinary bladder appears within normal limits.  Bones:  No aggressive osseous lesions.  Vasculature: Atherosclerosis.  No acute abnormality.  IMPRESSION: 1.  No acute abnormality. 2.  Tiny hypervascular lesion measuring 9 mm in the right hepatic lobe probably represents a small flash fill hemangioma. 3.  Left adrenal mass measuring 33 mm x 19 mm.  Differential considerations discussed above.  Follow-up adrenal MRI recommended for further assessment. Non-emergent MRI should be deferred until patient has been discharged for the acute illness, and can optimally cooperate with  positioning and breath-holding instructions. 4.  Mild enlargement of the common bile duct.  This may relate to prior passage of stones or sphincter dysfunction.  No calcified common duct stone identified.  Correlate with bilirubin and liver function tests. 5.  Atherosclerosis.   Original Report Authenticated By: Andreas Newport, M.D.     Microbiology: Recent Results (from the past 240 hour(s))  URINE CULTURE     Status: Normal   Collection Time   09/24/12 11:45 PM      Component Value Range Status Comment   Specimen Description URINE, RANDOM   Final    Special Requests NONE   Final    Culture  Setup Time 09/25/2012 05:40   Final    Colony Count 6,000 COLONIES/ML   Final    Culture INSIGNIFICANT GROWTH   Final    Report Status 09/26/2012 FINAL   Final   CLOSTRIDIUM DIFFICILE BY PCR     Status: Normal   Collection Time   09/25/12 12:22 AM      Component Value Range Status Comment   C difficile by pcr NEGATIVE  NEGATIVE Final      Labs: Basic Metabolic Panel:  Lab 09/25/12 1610 09/24/12 1425  NA 136 134*  K 3.7 4.0  CL 103 97  CO2 24 22  GLUCOSE 107* 97  BUN 23 22  CREATININE 0.66 0.78  CALCIUM 9.6 10.4  MG -- --  PHOS -- --    Liver Function Tests:  Lab 09/24/12 1425  AST 25  ALT 37  ALKPHOS 119*  BILITOT 0.6  PROT 7.0  ALBUMIN 3.0*    Lab 09/24/12 1425  LIPASE 24  AMYLASE --   CBC:  Lab 09/25/12 0544 09/24/12 1425  WBC 5.2 12.6*  NEUTROABS -- 10.3*  HGB 10.4* 11.7*  HCT 30.3* 34.0*  MCV 76.7* 77.1*  PLT 141* 153   Cardiac Enzymes:  Lab 09/25/12 0544 09/25/12 0030 09/24/12 1815 09/24/12 1425  CKTOTAL 25 24 23  --  CKMB 1.4 1.4 1.3 --  CKMBINDEX -- -- -- --  TROPONINI <0.30 <0.30 <0.30 <0.30    Time coordinating discharge: >30 minutes  Signed:  Lenetta Piche  [facility] 09/26/2012, 12:20 PM

## 2012-09-26 NOTE — Progress Notes (Signed)
CSW met with Pt who stated that he was able to speak with the [place] [facility] and he is cleared to come over today.   CSW provided bus pass for transportation to the facility.   No further needs at this time.   Leron Croak, LCSWA [employer] [phone]

## 2012-09-26 NOTE — Progress Notes (Signed)
INITIAL ADULT NUTRITION ASSESSMENT Date: 09/26/2012   Time: 12:20 PM  Reason for Assessment: Malnutrition screening tool, score of 3  INTERVENTION: Ensure Complete BID between meals.   ASSESSMENT: Male 51 y.o.  Dx: Chest pain, midsternal  Hx:  Past Medical History  Diagnosis Date  . Arthritis   . Substance abuse     Cocaine, marijuana, and alcohol  . Suicidal ideation 09/09/2012  . Chronic blood loss anemia 09/09/2012  . Rectal bleeding 09/08/2012  . Acute alcoholic pancreatitis 09/08/2012    Related Meds:     . aspirin EC  325 mg Oral Daily  . citalopram  20 mg Oral Daily  . ferrous sulfate  325 mg Oral Daily  . folic acid  1 mg Oral Daily  . multivitamin with minerals  1 tablet Oral Daily  . pantoprazole  40 mg Oral Daily  . sodium chloride  500 mL Intravenous Once  . sodium chloride  3 mL Intravenous Q12H  . thiamine  100 mg Oral Daily     Ht: 6\' 1"  (185.4 cm)  Wt: 131 lb 14.4 oz (59.829 kg)  Ideal Wt:83.6 kg % Ideal Wt: 72%  Usual Wt: 75 kg % Usual Wt: 80%  Body mass index is 17.40 kg/(m^2). Patient is underweight.  Food/Nutrition Related Hx: Poor appetite PTA.   Labs:  CMP     Component Value Date/Time   NA 136 09/25/2012 0544   K 3.7 09/25/2012 0544   CL 103 09/25/2012 0544   CO2 24 09/25/2012 0544   GLUCOSE 107* 09/25/2012 0544   BUN 23 09/25/2012 0544   CREATININE 0.66 09/25/2012 0544   CALCIUM 9.6 09/25/2012 0544   PROT 7.0 09/24/2012 1425   ALBUMIN 3.0* 09/24/2012 1425   AST 25 09/24/2012 1425   ALT 37 09/24/2012 1425   ALKPHOS 119* 09/24/2012 1425   BILITOT 0.6 09/24/2012 1425   GFRNONAA >90 09/25/2012 0544   GFRAA >90 09/25/2012 0544     Intake/Output Summary (Last 24 hours) at 09/26/12 1222 Last data filed at 09/26/12 1100  Gross per 24 hour  Intake   1200 ml  Output   2150 ml  Net   -950 ml    Diet Order: Regular, 100%  Supplements/Tube Feeding: None  IVF:    Estimated Nutritional Needs:   Kcal:1950-2100  kcal Protein:85-95 g Fluid:> 2.1 L  Patient reports he has not been eating well for the last 6 months due to weakness, decreased appetite, and iron deficiency. His appetite has improved greatly since admission. He meets the criteria for severe malnutrition in the context of chronic illness with 20% weight loss over 6 months and meeting <75% of estimated energy needs.   NUTRITION DIAGNOSIS: -Malnutrition (NI-5.2).  Status: Ongoing  RELATED TO: decreased appetite, weakness  AS EVIDENCE BY: 20% weight loss over 6 months and meeting <75% of estimated energy needs.   MONITORING/EVALUATION(Goals): Patient will meet 90-100% of estimated nutrition needs.  Monitor: PO intake, weight, labs  EDUCATION NEEDS: -No education needs identified at this time  Linnell Fulling, RD, LDN Pager #: [phone] After-Hours Pager #: [phone]   DOCUMENTATION CODES Per approved criteria  -Severe malnutrition in the context of chronic illness -Underweight    Fabio Pierce 09/26/2012, 12:20 PM

## 2012-09-26 NOTE — Consult Note (Signed)
Reason for Consult: chest pain, recent discharge from [facility] Referring Physician: unknown  Phillip Kemp is an 51 y.o. male.  HPI: Patient is a 51 year old African American male with past medical history of alcohol abuse, cocaine abuse who admitted with chest pain. Patient was recently discharged from the behavioral health center when he was admitted there with substance abuse.    he was using cocaine for 2 days (Monday and Tuesday), subsequently started having chest pain midsternal for last 2 days. Patient also endorsed having nausea, dizziness, and lightheadedness and shortness of breath with exertion. He feels short of breath after walking 2 blocks. He felt very weak but no focal weakness and hence presented to the ED. He also reports having some diarrhea and has noticed some blood in the stools but then also takes iron supplementation. Patient on endorsed having beer after the discharge from [facility]. Patient states that he was scheduled to go to [facility] facility on 09/23/2012 but they told him that his bed was not ready till 10/03/2012.     Interval hx:  Seen today and his chart was reviewed. Reports no SI, HI or AVH. Wants to quit drugs and wants to go to [facility] as planned. Per pt he is not going to use drugs now as he is concerned about his health now.   Past Medical History  Diagnosis Date  . Arthritis   . Substance abuse     Cocaine, marijuana, and alcohol  . Suicidal ideation 09/09/2012  . Chronic blood loss anemia 09/09/2012  . Rectal bleeding 09/08/2012  . Acute alcoholic pancreatitis 09/08/2012    Past Surgical History  Procedure Date  . Orthopedic surgery     Left foot surgery    Family History  Problem Relation Age of Onset  . Heart disease Mother   . Cancer Father     prostate    Social History:  reports that he has been smoking Cigarettes.  He has been smoking about .5 packs per day. He has never used smokeless tobacco. He reports that he drinks about 28.8 ounces of  alcohol per week. He reports that he uses illicit drugs (Cocaine).  Allergies: No Known Allergies  Medications: I have reviewed the patient's current medications.  Results for orders placed during the hospital encounter of 09/24/12 (from the past 48 hour(s))  TROPONIN I     Status: Normal   Collection Time   09/24/12  6:15 PM      Component Value Range Comment   Troponin I <0.30  <0.30 ng/mL   CK TOTAL AND CKMB     Status: Normal   Collection Time   09/24/12  6:15 PM      Component Value Range Comment   Total CK 23  7 - 232 U/L    CK, MB 1.3  0.3 - 4.0 ng/mL    Relative Index RELATIVE INDEX IS INVALID  0.0 - 2.5   TSH     Status: Abnormal   Collection Time   09/24/12  6:15 PM      Component Value Range Comment   TSH <0.008 (*) 0.350 - 4.500 uIU/mL   URINE CULTURE     Status: Normal   Collection Time   09/24/12 11:45 PM      Component Value Range Comment   Specimen Description URINE, RANDOM      Special Requests NONE      Culture  Setup Time 09/25/2012 05:40      Colony Count 6,000  COLONIES/ML      Culture INSIGNIFICANT GROWTH      Report Status 09/26/2012 FINAL     OCCULT BLOOD X 1 CARD TO LAB, STOOL     Status: Normal   Collection Time   09/25/12 12:21 AM      Component Value Range Comment   Fecal Occult Bld NEGATIVE     FECAL LACTOFERRIN     Status: Normal   Collection Time   09/25/12 12:22 AM      Component Value Range Comment   Specimen Description STOOL      Special Requests NONE      Fecal Lactoferrin POSITIVE      Report Status 09/26/2012 FINAL     CLOSTRIDIUM DIFFICILE BY PCR     Status: Normal   Collection Time   09/25/12 12:22 AM      Component Value Range Comment   C difficile by pcr NEGATIVE  NEGATIVE   TROPONIN I     Status: Normal   Collection Time   09/25/12 12:30 AM      Component Value Range Comment   Troponin I <0.30  <0.30 ng/mL   CK TOTAL AND CKMB     Status: Normal   Collection Time   09/25/12 12:30 AM      Component Value Range Comment    Total CK 24  7 - 232 U/L    CK, MB 1.4  0.3 - 4.0 ng/mL    Relative Index RELATIVE INDEX IS INVALID  0.0 - 2.5   TROPONIN I     Status: Normal   Collection Time   09/25/12  5:44 AM      Component Value Range Comment   Troponin I <0.30  <0.30 ng/mL   CK TOTAL AND CKMB     Status: Normal   Collection Time   09/25/12  5:44 AM      Component Value Range Comment   Total CK 25  7 - 232 U/L    CK, MB 1.4  0.3 - 4.0 ng/mL    Relative Index RELATIVE INDEX IS INVALID  0.0 - 2.5   BASIC METABOLIC PANEL     Status: Abnormal   Collection Time   09/25/12  5:44 AM      Component Value Range Comment   Sodium 136  135 - 145 mEq/L    Potassium 3.7  3.5 - 5.1 mEq/L    Chloride 103  96 - 112 mEq/L    CO2 24  19 - 32 mEq/L    Glucose, Bld 107 (*) 70 - 99 mg/dL    BUN 23  6 - 23 mg/dL    Creatinine, Ser 8.29  0.50 - 1.35 mg/dL    Calcium 9.6  8.4 - 56.2 mg/dL    GFR calc non Af Amer >90  >90 mL/min    GFR calc Af Amer >90  >90 mL/min   CBC     Status: Abnormal   Collection Time   09/25/12  5:44 AM      Component Value Range Comment   WBC 5.2  4.0 - 10.5 K/uL    RBC 3.95 (*) 4.22 - 5.81 MIL/uL    Hemoglobin 10.4 (*) 13.0 - 17.0 g/dL    HCT 13.0 (*) 86.5 - 52.0 %    MCV 76.7 (*) 78.0 - 100.0 fL    MCH 26.3  26.0 - 34.0 pg    MCHC 34.3  30.0 - 36.0 g/dL    RDW 13.8  11.5 - 15.5 %    Platelets 141 (*) 150 - 400 K/uL     Ct Angio Chest Pe W/cm &/or Wo Cm  09/24/2012  *RADIOLOGY REPORT*  Clinical Data: Pulmonary embolism.  Chest pain.  History of substance abuse and anemia.  CT ANGIOGRAPHY CHEST  Technique:  Multidetector CT imaging of the chest using the standard protocol during bolus administration of intravenous contrast. Multiplanar reconstructed images including MIPs were obtained and reviewed to evaluate the vascular anatomy.  Contrast: OMNIPAQUE IOHEXOL 350 MG/ML SOLN  Comparison: 10/06/2009.Abdominal CT 09/08/2012.  Findings: Bilateral gynecomastia is noted.  Prominent anterior  mediastinal soft tissue is present, which is new since the prior exam.  This is in the region of the thymus.  The study is technically adequate for evaluation of pulmonary embolus.  No pulmonary embolism is present.  Shotty bilateral axillary lymph nodes are present with mildly enlarged left axillary lymph node measuring 11 mm short axis.  Diffuse enlargement of the thyroid suggesting goiter.  No mediastinal or hilar adenopathy. Heart grossly normal.  Three-vessel aortic arch.  Incidental imaging the upper abdomen demonstrates an indeterminant left adrenal nodule measuring 3 cm x 2.4 cm. Nodule appears unchanged compared to recent abdominal CT.  Hypervascular liver lesions are present. In the lateral right hepatic lobe, there is a hypervascular lesion identified on recent CT.  There is also a hypervascular lesion is now seen in the posterior right hepatic lobe measuring 4 cm x 2.8 cm.  These lesions are nonspecific.  These were  not visualized on prior CT chest 10/06/2009.  No aggressive osseous lesions are identified.  The lungs appear clear.  IMPRESSION: 1.  Negative for pulmonary embolus.  No acute abnormality of the chest. 2.  Abnormal anterior mediastinal soft tissue.  The differential considerations include thymoma, substernal thyroid, teratoma, lymphoma, thymic hyperplasia. 3. Nonspecific mild left axillary adenopathy with 11 mm lymph node with a fatty hilum. 4.  Hypervascular liver lesions.  Follow-up abdominal MRI recommended with and without infusion after acute illness. Non- emergent MRI should be deferred until patient has been discharged for the acute illness, and can optimally cooperate with positioning and breath-holding instructions. 5.  Unchanged left adrenal mass.  Again, follow-up MRI is recommended.   Original Report Authenticated By: Andreas Newport, M.D.     ROS  Blood pressure 126/61, pulse 84, temperature 98.2 F (36.8 C), temperature source Oral, resp. rate 16, height 6\' 1"  (1.854 m),  weight 59.829 kg (131 lb 14.4 oz), SpO2 100.00%. Physical Exam     Mental Status Examination/Evaluation:  Appearance: on bed  Eye Contact:: Good  Speech: normal  Volume: Normal  Mood: dok  Affect: ristricted  Thought Process: organized  Orientation: Full  Thought Content: NO AVH  Suicidal Thoughts: No  Homicidal Thoughts: no  Memory: Recent; Poor  Judgement: Impaired  Insight: Lacking  Psychomotor Activity: Normal  Concentration: Fair  Recall: Fair  Akathisia: No  Assessment:  AXIS I: Polysubstance Dep  AXIS II: Deferred  AXIS III: see emdical hx ? ?  ? ?  ?  ? ?  ?  ?  AXIS IV: problems related to social environment, problems with access to health care services and problems with primary support group  AXIS V: 50  ?  Treatment Plan/Recommendations:   1. will recommend in pt drug rehab after medical clerance  2. Was educated about the risks again if he continues to use drugs today  3. Will sign off. Thanks for consult  Marlin Jarrard,  Livingston Diones 09/26/2012, 5:47 PM

## 2012-09-27 LAB — OVA AND PARASITE EXAMINATION: Ova and parasites: NONE SEEN

## 2012-09-28 LAB — STOOL CULTURE

## 2012-10-04 ENCOUNTER — Telehealth: Payer: Self-pay | Admitting: *Deleted

## 2012-10-04 ENCOUNTER — Ambulatory Visit: Payer: Medicare Other | Admitting: Gastroenterology

## 2012-10-04 NOTE — Telephone Encounter (Signed)
Pt was a no show

## 2012-10-07 ENCOUNTER — Encounter (HOSPITAL_BASED_OUTPATIENT_CLINIC_OR_DEPARTMENT_OTHER): Payer: Self-pay

## 2012-10-07 ENCOUNTER — Emergency Department (HOSPITAL_BASED_OUTPATIENT_CLINIC_OR_DEPARTMENT_OTHER)
Admission: EM | Admit: 2012-10-07 | Discharge: 2012-10-07 | Disposition: A | Discharge status: Home / Self Care | Payer: Medicare Other | Attending: Emergency Medicine | Admitting: Emergency Medicine

## 2012-10-07 ENCOUNTER — Emergency Department (HOSPITAL_BASED_OUTPATIENT_CLINIC_OR_DEPARTMENT_OTHER): Payer: Medicare Other

## 2012-10-07 DIAGNOSIS — Z87891 Personal history of nicotine dependence: Secondary | ICD-10-CM | POA: Insufficient documentation

## 2012-10-07 DIAGNOSIS — J189 Pneumonia, unspecified organism: Secondary | ICD-10-CM | POA: Insufficient documentation

## 2012-10-07 DIAGNOSIS — Z8739 Personal history of other diseases of the musculoskeletal system and connective tissue: Secondary | ICD-10-CM | POA: Insufficient documentation

## 2012-10-07 DIAGNOSIS — Z9889 Other specified postprocedural states: Secondary | ICD-10-CM | POA: Insufficient documentation

## 2012-10-07 DIAGNOSIS — F1010 Alcohol abuse, uncomplicated: Secondary | ICD-10-CM | POA: Insufficient documentation

## 2012-10-07 DIAGNOSIS — Z8719 Personal history of other diseases of the digestive system: Secondary | ICD-10-CM | POA: Insufficient documentation

## 2012-10-07 DIAGNOSIS — D500 Iron deficiency anemia secondary to blood loss (chronic): Secondary | ICD-10-CM | POA: Insufficient documentation

## 2012-10-07 DIAGNOSIS — IMO0001 Reserved for inherently not codable concepts without codable children: Secondary | ICD-10-CM | POA: Insufficient documentation

## 2012-10-07 DIAGNOSIS — R509 Fever, unspecified: Secondary | ICD-10-CM | POA: Insufficient documentation

## 2012-10-07 DIAGNOSIS — Z79899 Other long term (current) drug therapy: Secondary | ICD-10-CM | POA: Insufficient documentation

## 2012-10-07 DIAGNOSIS — R5381 Other malaise: Secondary | ICD-10-CM | POA: Insufficient documentation

## 2012-10-07 DIAGNOSIS — R51 Headache: Secondary | ICD-10-CM | POA: Insufficient documentation

## 2012-10-07 DIAGNOSIS — F1910 Other psychoactive substance abuse, uncomplicated: Secondary | ICD-10-CM | POA: Insufficient documentation

## 2012-10-07 DIAGNOSIS — J029 Acute pharyngitis, unspecified: Secondary | ICD-10-CM | POA: Insufficient documentation

## 2012-10-07 DIAGNOSIS — R5383 Other fatigue: Secondary | ICD-10-CM | POA: Insufficient documentation

## 2012-10-07 DIAGNOSIS — Z8659 Personal history of other mental and behavioral disorders: Secondary | ICD-10-CM | POA: Insufficient documentation

## 2012-10-07 LAB — CBC WITH DIFFERENTIAL/PLATELET
Eosinophils Absolute: 0.1 10*3/uL (ref 0.0–0.7)
Eosinophils Relative: 1 % (ref 0–5)
HCT: 27.4 % — ABNORMAL LOW (ref 39.0–52.0)
Lymphocytes Relative: 15 % (ref 12–46)
Lymphs Abs: 1.3 10*3/uL (ref 0.7–4.0)
MCH: 26.7 pg (ref 26.0–34.0)
MCV: 80.4 fL (ref 78.0–100.0)
Monocytes Absolute: 0.8 10*3/uL (ref 0.1–1.0)
Monocytes Relative: 10 % (ref 3–12)
RBC: 3.41 MIL/uL — ABNORMAL LOW (ref 4.22–5.81)
WBC: 8.3 10*3/uL (ref 4.0–10.5)

## 2012-10-07 LAB — COMPREHENSIVE METABOLIC PANEL
ALT: 42 U/L (ref 0–53)
BUN: 13 mg/dL (ref 6–23)
CO2: 26 mEq/L (ref 19–32)
Calcium: 9.2 mg/dL (ref 8.4–10.5)
Creatinine, Ser: 0.6 mg/dL (ref 0.50–1.35)
GFR calc Af Amer: >90 mL/min (ref >90)
GFR calc non Af Amer: >90 mL/min (ref >90)
Glucose, Bld: 110 mg/dL — ABNORMAL HIGH (ref 70–99)
Sodium: 142 mEq/L (ref 135–145)
Total Protein: 5.9 g/dL — ABNORMAL LOW (ref 6.0–8.3)

## 2012-10-07 LAB — LIPASE, BLOOD: Lipase: 44 U/L (ref 11–59)

## 2012-10-07 MED ORDER — SODIUM CHLORIDE 0.9 % IV BOLUS (SEPSIS)
1000.0000 mL | Freq: Once | INTRAVENOUS | Status: AC
  Administered 2012-10-07: 1000 mL via INTRAVENOUS

## 2012-10-07 MED ORDER — AZITHROMYCIN 250 MG PO TABS: 250.0000 mg | ORAL_TABLET | Freq: Every day | ORAL | Status: DC

## 2012-10-07 NOTE — ED Provider Notes (Signed)
History     CSN: [id number]  Arrival date & time 10/07/12  4098   First MD Initiated Contact with Patient 10/07/12 1017      Chief Complaint  Patient presents with  . Headache  . Cough  . Generalized Body Aches    (Consider location/radiation/quality/duration/timing/severity/associated sxs/prior treatment) HPI Comments: Patient presents with 2 day history of uri-like symptoms.    Also would like to have his "pancreas checked".  He has a history of chronic alcoholism for which is in treatment at [facility] currently.  In the past he was told he had pancreatitis.  He is having similar discomfort in the left upper abdomen feels similar.  Patient is a 51 y.o. male presenting with URI. The history is provided by the patient.  URI The primary symptoms include fever, fatigue, headaches, sore throat, cough, nausea and myalgias. The current episode started 2 days ago. This is a new problem. The problem has been gradually worsening.  Symptoms associated with the illness include chills and congestion. The illness is not associated with facial pain.    Past Medical History  Diagnosis Date  . Arthritis   . Substance abuse     Cocaine, marijuana, and alcohol  . Suicidal ideation 09/09/2012  . Chronic blood loss anemia 09/09/2012  . Rectal bleeding 09/08/2012  . Acute alcoholic pancreatitis 09/08/2012    Past Surgical History  Procedure Date  . Orthopedic surgery     Left foot surgery    Family History  Problem Relation Age of Onset  . Heart disease Mother   . Cancer Father     prostate    History  Substance Use Topics  . Smoking status: Former Smoker -- 0.5 packs/day    Types: Cigarettes  . Smokeless tobacco: Former Neurosurgeon    Quit date: 09/16/2012  . Alcohol Use: 28.8 oz/week    48 Cans of beer per week     dlast drink 3 weeks ago- presently in [place]      Review of Systems  Constitutional: Positive for fever, chills and fatigue.  HENT: Positive for congestion and sore  throat.   Respiratory: Positive for cough.   Gastrointestinal: Positive for nausea.  Musculoskeletal: Positive for myalgias.  Neurological: Positive for headaches.  All other systems reviewed and are negative.    Allergies  Review of patient's allergies indicates no known allergies.  Home Medications   Current Outpatient Rx  Name Route Sig Dispense Refill  . CITALOPRAM HYDROBROMIDE 20 MG PO TABS Oral Take 1 tablet (20 mg total) by mouth daily. 30 tablet 0  . FERROUS SULFATE 325 (65 FE) MG PO TABS Oral Take 325 mg by mouth daily. For chronic anemia    . TRAZODONE HCL 100 MG PO TABS Oral Take 1 tablet (100 mg total) by mouth at bedtime as needed and may repeat dose one time if needed for sleep. For insomnia 30 tablet 0    There were no vitals taken for this visit.  Physical Exam  Nursing note and vitals reviewed. Constitutional: He is oriented to person, place, and time. He appears well-developed and well-nourished. No distress.  HENT:  Head: Normocephalic and atraumatic.  Right Ear: External ear normal.  Left Ear: External ear normal.  Mouth/Throat: Oropharynx is clear and moist.  Neck: Normal range of motion. Neck supple.  Cardiovascular: Normal rate and regular rhythm.   No murmur heard. Pulmonary/Chest: Effort normal and breath sounds normal. No respiratory distress. He has no wheezes.  Abdominal: Soft. Bowel sounds  are normal. He exhibits no distension. There is no tenderness.  Musculoskeletal: Normal range of motion. He exhibits no edema.  Lymphadenopathy:    He has no cervical adenopathy.  Neurological: He is alert and oriented to person, place, and time.  Skin: Skin is warm and dry. He is not diaphoretic.    ED Course  Procedures (including critical care time)  Labs Reviewed - No data to display No results found.   No diagnosis found.    MDM  No evidence for pancreatitis.  He was hydrated and is feeling better.  The chest xray does have finding that are  suggestive of a developing pneumonia.  Will treat with Zithromax, follow up prn.          Geoffery Lyons, MD 10/07/12 1141

## 2012-10-07 NOTE — ED Notes (Signed)
Pt reports onset of headache, cough and generalized body aches unrelieved after taking Ibuprofen.  He also wants his "pancreas checked out".

## 2012-10-07 NOTE — ED Notes (Signed)
Pt did not have a left hand IV on arrival. Charted from previous visit.

## 2012-10-07 NOTE — ED Notes (Signed)
Pt reports generalized body aches and frontal headache that started yesterday.

## 2012-10-07 NOTE — ED Notes (Signed)
MD at bedside. 

## 2012-10-20 ENCOUNTER — Encounter (HOSPITAL_BASED_OUTPATIENT_CLINIC_OR_DEPARTMENT_OTHER): Payer: Self-pay | Admitting: Family Medicine

## 2012-10-20 ENCOUNTER — Emergency Department (HOSPITAL_BASED_OUTPATIENT_CLINIC_OR_DEPARTMENT_OTHER)
Admission: EM | Admit: 2012-10-20 | Discharge: 2012-10-20 | Disposition: A | Discharge status: Home / Self Care | Payer: Medicare Other | Attending: Emergency Medicine | Admitting: Emergency Medicine

## 2012-10-20 DIAGNOSIS — D649 Anemia, unspecified: Secondary | ICD-10-CM | POA: Insufficient documentation

## 2012-10-20 DIAGNOSIS — F1910 Other psychoactive substance abuse, uncomplicated: Secondary | ICD-10-CM | POA: Insufficient documentation

## 2012-10-20 DIAGNOSIS — R42 Dizziness and giddiness: Secondary | ICD-10-CM | POA: Insufficient documentation

## 2012-10-20 DIAGNOSIS — Z79899 Other long term (current) drug therapy: Secondary | ICD-10-CM | POA: Insufficient documentation

## 2012-10-20 DIAGNOSIS — T887XXA Unspecified adverse effect of drug or medicament, initial encounter: Secondary | ICD-10-CM

## 2012-10-20 DIAGNOSIS — T43205A Adverse effect of unspecified antidepressants, initial encounter: Secondary | ICD-10-CM | POA: Insufficient documentation

## 2012-10-20 DIAGNOSIS — K117 Disturbances of salivary secretion: Secondary | ICD-10-CM | POA: Insufficient documentation

## 2012-10-20 DIAGNOSIS — Z87891 Personal history of nicotine dependence: Secondary | ICD-10-CM | POA: Insufficient documentation

## 2012-10-20 LAB — BASIC METABOLIC PANEL
Chloride: 105 mEq/L (ref 96–112)
GFR calc Af Amer: >90 mL/min (ref >90)
Potassium: 3.9 mEq/L (ref 3.5–5.1)
Sodium: 139 mEq/L (ref 135–145)

## 2012-10-20 LAB — CBC WITH DIFFERENTIAL/PLATELET
Basophils Absolute: 0 10*3/uL (ref 0.0–0.1)
Basophils Relative: 0 % (ref 0–1)
MCHC: 33.2 g/dL (ref 30.0–36.0)
Neutro Abs: 3.9 10*3/uL (ref 1.7–7.7)
Neutrophils Relative %: 57 % (ref 43–77)
RDW: 14.5 % (ref 11.5–15.5)
WBC: 6.8 10*3/uL (ref 4.0–10.5)

## 2012-10-20 MED ORDER — SODIUM CHLORIDE 0.9 % IV BOLUS (SEPSIS)
1000.0000 mL | Freq: Once | INTRAVENOUS | Status: AC
  Administered 2012-10-20: 1000 mL via INTRAVENOUS

## 2012-10-20 NOTE — ED Provider Notes (Signed)
History     CSN: [id number]  Arrival date & time 10/20/12  1223   First MD Initiated Contact with Patient 10/20/12 1334      Chief Complaint  Patient presents with  . Medication Reaction    (Consider location/radiation/quality/duration/timing/severity/associated sxs/prior treatment) HPI Comments: Pt states that he is at [facility] and he was put on celexa and feels shaky and dizzy and is having dry mouth:pt states that he has a history of anemia and he decreased his iron dose and he is unsure of if related to new medication for anemia:pt states that he went thru etoh detox a month ago  Patient is a 51 y.o. male presenting with allergic reaction. The history is provided by the patient. No language interpreter was used.  Allergic Reaction The primary symptoms are  dizziness. The primary symptoms do not include nausea or vomiting. The current episode started more than 2 days ago. The problem has not changed since onset. Dizziness does not occur with nausea or vomiting.     Past Medical History  Diagnosis Date  . Arthritis   . Substance abuse     Cocaine, marijuana, and alcohol  . Suicidal ideation 09/09/2012  . Chronic blood loss anemia 09/09/2012  . Rectal bleeding 09/08/2012  . Acute alcoholic pancreatitis 09/08/2012    Past Surgical History  Procedure Date  . Orthopedic surgery     Left foot surgery    Family History  Problem Relation Age of Onset  . Heart disease Mother   . Cancer Father     prostate    History  Substance Use Topics  . Smoking status: Former Smoker -- 0.5 packs/day    Types: Cigarettes  . Smokeless tobacco: Former Neurosurgeon    Quit date: 09/16/2012  . Alcohol Use: 28.8 oz/week    48 Cans of beer per week     Comment: dlast drink 3 weeks ago- presently in [place]      Review of Systems  Constitutional: Negative.   Respiratory: Negative.   Cardiovascular: Negative.   Gastrointestinal: Negative for nausea and vomiting.  Neurological: Positive for  dizziness.    Allergies  Review of patient's allergies indicates no known allergies.  Home Medications   Current Outpatient Rx  Name  Route  Sig  Dispense  Refill  . AZITHROMYCIN 250 MG PO TABS   Oral   Take 1 tablet (250 mg total) by mouth daily. Take first 2 tablets together, then 1 every day until finished.   6 tablet   0   . TRAMADOL HCL PO   Oral   Take by mouth.         Marland Kitchen CITALOPRAM HYDROBROMIDE 20 MG PO TABS   Oral   Take 1 tablet (20 mg total) by mouth daily.   30 tablet   0   . FERROUS SULFATE 325 (65 FE) MG PO TABS   Oral   Take 325 mg by mouth daily. For chronic anemia         . TRAZODONE HCL 100 MG PO TABS   Oral   Take 1 tablet (100 mg total) by mouth at bedtime as needed and may repeat dose one time if needed for sleep. For insomnia   30 tablet   0     BP 105/52  Pulse 108  Temp 98.5 F (36.9 C) (Oral)  Resp 18  Ht 6\' 3"  (1.905 m)  Wt 141 lb (63.957 kg)  BMI 17.62 kg/m2  SpO2 100%  Physical Exam  Nursing note and vitals reviewed. Constitutional: He is oriented to person, place, and time. He appears well-developed and well-nourished.  HENT:  Head: Normocephalic and atraumatic.  Eyes: Conjunctivae normal and EOM are normal.  Neck: Neck supple.  Cardiovascular: Normal rate and regular rhythm.   Pulmonary/Chest: Effort normal and breath sounds normal.  Musculoskeletal: Normal range of motion.  Neurological: He is alert and oriented to person, place, and time.  Skin: Skin is warm and dry.  Psychiatric: He has a normal mood and affect.    ED Course  Procedures (including critical care time)  Labs Reviewed  CBC WITH DIFFERENTIAL - Abnormal; Notable for the following:    RBC 3.68 (*)     Hemoglobin 9.6 (*)     HCT 28.9 (*)     Platelets 148 (*)     Monocytes Relative 13 (*)     All other components within normal limits  BASIC METABOLIC PANEL - Abnormal; Notable for the following:    Glucose, Bld 106 (*)     All other components  within normal limits  CBC WITH DIFFERENTIAL   No results found.   1. Medication side effect       MDM  Pt feeling better after fluids:pt okay to go back to [facility]        Teressa Lower, NP 10/20/12 2047

## 2012-10-20 NOTE — ED Notes (Signed)
Lab called-stated blood is clotted-[employer]eri [employer] buyer to [facility] for IV/lab attempt

## 2012-10-20 NOTE — ED Notes (Signed)
Difficult IV stick x 1 to left FA-labs drawn with syringe-unable to advance cath for IV-pt advised I would send blood but would possibly need additional stick if blood clotted or need IVF-agreed

## 2012-10-20 NOTE — ED Notes (Addendum)
Pt is in [facility] rehab. Pt sts they started him on Celexa and since then he has had dry mouth, frequent urination, lightheadedness and "shaking when I am relaxing". Pt sts he has also been in detox x 1 wk, at [facility] x 2 wks and has not had etoh x 1 month.

## 2012-10-21 NOTE — ED Provider Notes (Signed)
History/physical exam/procedure(s) were performed by non-physician practitioner and as supervising physician I was immediately available for consultation/collaboration. I have reviewed all notes and am in agreement with care and plan.   Hilario Quarry, MD 10/21/12 1325

## 2012-11-15 ENCOUNTER — Emergency Department (HOSPITAL_COMMUNITY)
Admission: EM | Admit: 2012-11-15 | Discharge: 2012-11-16 | Disposition: A | Discharge status: Home / Self Care | Payer: Medicare Other | Attending: Emergency Medicine | Admitting: Emergency Medicine

## 2012-11-15 ENCOUNTER — Encounter (HOSPITAL_COMMUNITY): Payer: Self-pay | Admitting: *Deleted

## 2012-11-15 DIAGNOSIS — Z79899 Other long term (current) drug therapy: Secondary | ICD-10-CM | POA: Insufficient documentation

## 2012-11-15 DIAGNOSIS — S1093XA Contusion of unspecified part of neck, initial encounter: Secondary | ICD-10-CM | POA: Insufficient documentation

## 2012-11-15 DIAGNOSIS — S025XXA Fracture of tooth (traumatic), initial encounter for closed fracture: Secondary | ICD-10-CM | POA: Insufficient documentation

## 2012-11-15 DIAGNOSIS — Z8719 Personal history of other diseases of the digestive system: Secondary | ICD-10-CM | POA: Insufficient documentation

## 2012-11-15 DIAGNOSIS — Z8739 Personal history of other diseases of the musculoskeletal system and connective tissue: Secondary | ICD-10-CM | POA: Insufficient documentation

## 2012-11-15 DIAGNOSIS — S0083XA Contusion of other part of head, initial encounter: Secondary | ICD-10-CM

## 2012-11-15 DIAGNOSIS — Z23 Encounter for immunization: Secondary | ICD-10-CM | POA: Insufficient documentation

## 2012-11-15 DIAGNOSIS — S0003XA Contusion of scalp, initial encounter: Secondary | ICD-10-CM | POA: Insufficient documentation

## 2012-11-15 DIAGNOSIS — Z87891 Personal history of nicotine dependence: Secondary | ICD-10-CM | POA: Insufficient documentation

## 2012-11-15 DIAGNOSIS — S01511A Laceration without foreign body of lip, initial encounter: Secondary | ICD-10-CM

## 2012-11-15 DIAGNOSIS — R51 Headache: Secondary | ICD-10-CM

## 2012-11-15 DIAGNOSIS — S0990XA Unspecified injury of head, initial encounter: Secondary | ICD-10-CM | POA: Insufficient documentation

## 2012-11-15 DIAGNOSIS — S01501A Unspecified open wound of lip, initial encounter: Secondary | ICD-10-CM | POA: Insufficient documentation

## 2012-11-15 DIAGNOSIS — F1010 Alcohol abuse, uncomplicated: Secondary | ICD-10-CM | POA: Insufficient documentation

## 2012-11-15 DIAGNOSIS — F1410 Cocaine abuse, uncomplicated: Secondary | ICD-10-CM | POA: Insufficient documentation

## 2012-11-15 DIAGNOSIS — Z862 Personal history of diseases of the blood and blood-forming organs and certain disorders involving the immune mechanism: Secondary | ICD-10-CM | POA: Insufficient documentation

## 2012-11-15 DIAGNOSIS — K859 Acute pancreatitis without necrosis or infection, unspecified: Secondary | ICD-10-CM | POA: Insufficient documentation
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
## 2012-11-16 ENCOUNTER — Emergency Department (HOSPITAL_COMMUNITY): Payer: Medicare Other

## 2012-11-16 ENCOUNTER — Encounter (HOSPITAL_COMMUNITY): Payer: Self-pay

## 2012-11-16 MED ORDER — PENICILLIN V POTASSIUM 500 MG PO TABS: 500.0000 mg | ORAL_TABLET | Freq: Four times a day (QID) | ORAL | Status: DC

## 2012-11-16 MED ORDER — NAPROXEN 500 MG PO TABS: 500.0000 mg | ORAL_TABLET | Freq: Two times a day (BID) | ORAL | Status: DC

## 2012-11-16 MED ORDER — KETOROLAC TROMETHAMINE 60 MG/2ML IM SOLN
60.0000 mg | Freq: Once | INTRAMUSCULAR | Status: AC
  Administered 2012-11-16: 60 mg via INTRAMUSCULAR
  Filled 2012-11-16: qty 2

## 2012-11-16 MED ORDER — TETANUS-DIPHTH-ACELL PERTUSSIS 5-2.5-18.5 LF-MCG/0.5 IM SUSP
0.5000 mL | Freq: Once | INTRAMUSCULAR | Status: AC
  Administered 2012-11-16: 0.5 mL via INTRAMUSCULAR
  Filled 2012-11-16 (×2): qty 0.5

## 2012-11-16 NOTE — ED Notes (Signed)
Patient transported to CT 
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
## 2012-12-05 ENCOUNTER — Encounter (HOSPITAL_COMMUNITY): Payer: Self-pay | Admitting: Emergency Medicine

## 2012-12-05 ENCOUNTER — Emergency Department (HOSPITAL_COMMUNITY)
Admission: EM | Admit: 2012-12-05 | Discharge: 2012-12-05 | Disposition: A | Discharge status: Home / Self Care | Payer: Medicare Other | Attending: Emergency Medicine | Admitting: Emergency Medicine

## 2012-12-05 DIAGNOSIS — Z87891 Personal history of nicotine dependence: Secondary | ICD-10-CM | POA: Insufficient documentation

## 2012-12-05 DIAGNOSIS — R1110 Vomiting, unspecified: Secondary | ICD-10-CM | POA: Insufficient documentation

## 2012-12-05 DIAGNOSIS — N509 Disorder of male genital organs, unspecified: Secondary | ICD-10-CM | POA: Insufficient documentation

## 2012-12-05 DIAGNOSIS — Z8719 Personal history of other diseases of the digestive system: Secondary | ICD-10-CM | POA: Insufficient documentation

## 2012-12-05 DIAGNOSIS — R42 Dizziness and giddiness: Secondary | ICD-10-CM | POA: Insufficient documentation

## 2012-12-05 DIAGNOSIS — D500 Iron deficiency anemia secondary to blood loss (chronic): Secondary | ICD-10-CM | POA: Insufficient documentation

## 2012-12-05 DIAGNOSIS — F1410 Cocaine abuse, uncomplicated: Secondary | ICD-10-CM | POA: Insufficient documentation

## 2012-12-05 DIAGNOSIS — N453 Epididymo-orchitis: Secondary | ICD-10-CM | POA: Insufficient documentation

## 2012-12-05 DIAGNOSIS — B300 Keratoconjunctivitis due to adenovirus: Secondary | ICD-10-CM | POA: Insufficient documentation

## 2012-12-05 DIAGNOSIS — F1010 Alcohol abuse, uncomplicated: Secondary | ICD-10-CM | POA: Insufficient documentation

## 2012-12-05 DIAGNOSIS — N451 Epididymitis: Secondary | ICD-10-CM

## 2012-12-05 DIAGNOSIS — Z79899 Other long term (current) drug therapy: Secondary | ICD-10-CM | POA: Insufficient documentation

## 2012-12-05 DIAGNOSIS — H538 Other visual disturbances: Secondary | ICD-10-CM | POA: Insufficient documentation

## 2012-12-05 DIAGNOSIS — Z8739 Personal history of other diseases of the musculoskeletal system and connective tissue: Secondary | ICD-10-CM | POA: Insufficient documentation

## 2012-12-05 DIAGNOSIS — F1210 Cannabis abuse, uncomplicated: Secondary | ICD-10-CM | POA: Insufficient documentation

## 2012-12-05 MED ORDER — TETRACAINE HCL 0.5 % OP SOLN
2.0000 [drp] | Freq: Once | OPHTHALMIC | Status: AC
  Administered 2012-12-05: 2 [drp] via OPHTHALMIC
  Filled 2012-12-05 (×2): qty 2

## 2012-12-05 MED ORDER — DOXYCYCLINE HYCLATE 100 MG PO TABS
100.0000 mg | ORAL_TABLET | Freq: Once | ORAL | Status: AC
  Administered 2012-12-05: 100 mg via ORAL
  Filled 2012-12-05: qty 1

## 2012-12-05 MED ORDER — DOXYCYCLINE HYCLATE 100 MG PO CAPS: 100.0000 mg | ORAL_CAPSULE | Freq: Two times a day (BID) | ORAL | Status: DC

## 2012-12-05 MED ORDER — OXYCODONE-ACETAMINOPHEN 5-325 MG PO TABS
1.0000 | ORAL_TABLET | Freq: Once | ORAL | Status: AC
  Administered 2012-12-05: 1 via ORAL
  Filled 2012-12-05: qty 1

## 2012-12-05 MED ORDER — FLUORESCEIN SODIUM 1 MG OP STRP
1.0000 | ORAL_STRIP | Freq: Once | OPHTHALMIC | Status: AC
  Administered 2012-12-05: 1 via OPHTHALMIC

## 2012-12-05 MED ORDER — KETOROLAC TROMETHAMINE 0.5 % OP SOLN: 1.0000 [drp] | Freq: Four times a day (QID) | OPHTHALMIC | Status: DC

## 2012-12-05 MED ORDER — OXYCODONE-ACETAMINOPHEN 5-325 MG PO TABS: 1.0000 | ORAL_TABLET | ORAL | Status: AC | PRN
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
## 2012-12-05 NOTE — ED Notes (Signed)
Pt states that he has been having lower abdominal pain for a while, states that he made Dr. Ignacia Palma aware, but did not inform anyone else.  Pt states that he thinks he may have been exposed to a STD.

## 2012-12-07 LAB — GC/CHLAMYDIA PROBE AMP
CT Probe RNA: NEGATIVE
GC Probe RNA: NEGATIVE

## 2012-12-08 LAB — HERPES SIMPLEX VIRUS CULTURE: Special Requests: NORMAL

## 2013-07-15 ENCOUNTER — Emergency Department (HOSPITAL_COMMUNITY): Payer: Medicare Other

## 2013-07-15 ENCOUNTER — Inpatient Hospital Stay (HOSPITAL_COMMUNITY)
Admission: EM | Admit: 2013-07-15 | Discharge: 2013-07-18 | DRG: 177 | Disposition: A | Discharge status: Home / Self Care | Payer: Medicare Other | Attending: Internal Medicine | Admitting: Internal Medicine

## 2013-07-15 ENCOUNTER — Encounter (HOSPITAL_COMMUNITY): Payer: Self-pay | Admitting: *Deleted

## 2013-07-15 DIAGNOSIS — R05 Cough: Secondary | ICD-10-CM

## 2013-07-15 DIAGNOSIS — F329 Major depressive disorder, single episode, unspecified: Secondary | ICD-10-CM

## 2013-07-15 DIAGNOSIS — Z681 Body mass index (BMI) 19 or less, adult: Secondary | ICD-10-CM

## 2013-07-15 DIAGNOSIS — E860 Dehydration: Secondary | ICD-10-CM

## 2013-07-15 DIAGNOSIS — E279 Disorder of adrenal gland, unspecified: Secondary | ICD-10-CM

## 2013-07-15 DIAGNOSIS — D649 Anemia, unspecified: Secondary | ICD-10-CM

## 2013-07-15 DIAGNOSIS — E236 Other disorders of pituitary gland: Secondary | ICD-10-CM | POA: Diagnosis present

## 2013-07-15 DIAGNOSIS — E872 Acidosis, unspecified: Secondary | ICD-10-CM

## 2013-07-15 DIAGNOSIS — D509 Iron deficiency anemia, unspecified: Secondary | ICD-10-CM | POA: Diagnosis present

## 2013-07-15 DIAGNOSIS — E0590 Thyrotoxicosis, unspecified without thyrotoxic crisis or storm: Secondary | ICD-10-CM

## 2013-07-15 DIAGNOSIS — R112 Nausea with vomiting, unspecified: Secondary | ICD-10-CM

## 2013-07-15 DIAGNOSIS — J189 Pneumonia, unspecified organism: Secondary | ICD-10-CM

## 2013-07-15 DIAGNOSIS — E871 Hypo-osmolality and hyponatremia: Secondary | ICD-10-CM

## 2013-07-15 DIAGNOSIS — R45851 Suicidal ideations: Secondary | ICD-10-CM

## 2013-07-15 DIAGNOSIS — D696 Thrombocytopenia, unspecified: Secondary | ICD-10-CM

## 2013-07-15 DIAGNOSIS — E0500 Thyrotoxicosis with diffuse goiter without thyrotoxic crisis or storm: Secondary | ICD-10-CM | POA: Diagnosis present

## 2013-07-15 DIAGNOSIS — D500 Iron deficiency anemia secondary to blood loss (chronic): Secondary | ICD-10-CM | POA: Diagnosis present

## 2013-07-15 DIAGNOSIS — R7989 Other specified abnormal findings of blood chemistry: Secondary | ICD-10-CM

## 2013-07-15 DIAGNOSIS — E876 Hypokalemia: Secondary | ICD-10-CM

## 2013-07-15 DIAGNOSIS — E010 Iodine-deficiency related diffuse (endemic) goiter: Secondary | ICD-10-CM

## 2013-07-15 DIAGNOSIS — J690 Pneumonitis due to inhalation of food and vomit: Secondary | ICD-10-CM | POA: Diagnosis present

## 2013-07-15 DIAGNOSIS — E43 Unspecified severe protein-calorie malnutrition: Secondary | ICD-10-CM

## 2013-07-15 DIAGNOSIS — F4321 Adjustment disorder with depressed mood: Secondary | ICD-10-CM | POA: Diagnosis present

## 2013-07-15 DIAGNOSIS — R109 Unspecified abdominal pain: Secondary | ICD-10-CM

## 2013-07-15 DIAGNOSIS — E278 Other specified disorders of adrenal gland: Secondary | ICD-10-CM

## 2013-07-15 DIAGNOSIS — F32A Depression, unspecified: Secondary | ICD-10-CM

## 2013-07-15 DIAGNOSIS — R059 Cough, unspecified: Secondary | ICD-10-CM

## 2013-07-15 LAB — COMPREHENSIVE METABOLIC PANEL
Alkaline Phosphatase: 207 U/L — ABNORMAL HIGH (ref 39–117)
BUN: 53 mg/dL — ABNORMAL HIGH (ref 6–23)
CO2: 16 mEq/L — ABNORMAL LOW (ref 19–32)
Chloride: 95 mEq/L — ABNORMAL LOW (ref 96–112)
Creatinine, Ser: 1.1 mg/dL (ref 0.50–1.35)
GFR calc Af Amer: 87 mL/min — ABNORMAL LOW (ref >90)
GFR calc non Af Amer: 75 mL/min — ABNORMAL LOW (ref >90)
Glucose, Bld: 113 mg/dL — ABNORMAL HIGH (ref 70–99)
Potassium: 3.4 mEq/L — ABNORMAL LOW (ref 3.5–5.1)
Total Bilirubin: 0.4 mg/dL (ref 0.3–1.2)

## 2013-07-15 LAB — URINALYSIS, ROUTINE W REFLEX MICROSCOPIC
Glucose, UA: NEGATIVE mg/dL
Protein, ur: 30 mg/dL — AB
Specific Gravity, Urine: 1.025 (ref 1.005–1.030)

## 2013-07-15 LAB — ETHANOL: Alcohol, Ethyl (B): <11 mg/dL (ref 0–11)

## 2013-07-15 LAB — LACTIC ACID, PLASMA: Lactic Acid, Venous: 2.8 mmol/L — ABNORMAL HIGH (ref 0.5–2.2)

## 2013-07-15 LAB — CBC
HCT: 36.1 % — ABNORMAL LOW (ref 39.0–52.0)
Hemoglobin: 12.3 g/dL — ABNORMAL LOW (ref 13.0–17.0)
MCV: 74.4 fL — ABNORMAL LOW (ref 78.0–100.0)
RDW: 14.5 % (ref 11.5–15.5)
WBC: 29.2 10*3/uL — ABNORMAL HIGH (ref 4.0–10.5)

## 2013-07-15 LAB — LIPASE, BLOOD: Lipase: 23 U/L (ref 11–59)

## 2013-07-15 LAB — KETONES, QUALITATIVE: Acetone, Bld: NEGATIVE

## 2013-07-15 LAB — URINE MICROSCOPIC-ADD ON

## 2013-07-15 LAB — RAPID URINE DRUG SCREEN, HOSP PERFORMED: Opiates: NOT DETECTED

## 2013-07-15 LAB — HIV ANTIBODY (ROUTINE TESTING W REFLEX): HIV: NONREACTIVE

## 2013-07-15 MED ORDER — SODIUM CHLORIDE 0.9 % IV BOLUS (SEPSIS)
1000.0000 mL | Freq: Once | INTRAVENOUS | Status: AC
  Administered 2013-07-15: 1000 mL via INTRAVENOUS

## 2013-07-15 MED ORDER — DEXTROSE 5 % IV SOLN
1.0000 g | INTRAVENOUS | Status: DC
  Administered 2013-07-15: 1 g via INTRAVENOUS
  Filled 2013-07-15: qty 10

## 2013-07-15 MED ORDER — SODIUM CHLORIDE 0.9 % IV SOLN: INTRAVENOUS | Status: DC

## 2013-07-15 MED ORDER — TRAZODONE HCL 100 MG PO TABS
100.0000 mg | ORAL_TABLET | Freq: Every evening | ORAL | Status: DC | PRN
  Administered 2013-07-15 – 2013-07-17 (×3): 100 mg via ORAL
  Filled 2013-07-15 (×3): qty 1

## 2013-07-15 MED ORDER — ONDANSETRON HCL 4 MG/2ML IJ SOLN
4.0000 mg | Freq: Once | INTRAMUSCULAR | Status: AC
  Administered 2013-07-15: 4 mg via INTRAVENOUS
  Filled 2013-07-15: qty 2

## 2013-07-15 MED ORDER — HYDROCODONE-ACETAMINOPHEN 5-325 MG PO TABS
1.0000 | ORAL_TABLET | ORAL | Status: DC | PRN
  Administered 2013-07-15: 2 via ORAL
  Administered 2013-07-15: 1 via ORAL
  Administered 2013-07-16 – 2013-07-18 (×2): 2 via ORAL
  Filled 2013-07-15: qty 1
  Filled 2013-07-15 (×3): qty 2

## 2013-07-15 MED ORDER — HYDROMORPHONE HCL PF 1 MG/ML IJ SOLN
1.0000 mg | Freq: Once | INTRAMUSCULAR | Status: AC
  Administered 2013-07-15: 1 mg via INTRAVENOUS
  Filled 2013-07-15: qty 1

## 2013-07-15 MED ORDER — IOHEXOL 300 MG/ML  SOLN
50.0000 mL | Freq: Once | INTRAMUSCULAR | Status: AC | PRN
  Administered 2013-07-15: 50 mL via ORAL

## 2013-07-15 MED ORDER — ONDANSETRON HCL 4 MG/2ML IJ SOLN
4.0000 mg | Freq: Four times a day (QID) | INTRAMUSCULAR | Status: DC | PRN
  Administered 2013-07-15 – 2013-07-16 (×2): 4 mg via INTRAVENOUS
  Filled 2013-07-15 (×2): qty 2

## 2013-07-15 MED ORDER — AZITHROMYCIN 250 MG PO TABS
500.0000 mg | ORAL_TABLET | Freq: Once | ORAL | Status: AC
  Administered 2013-07-15: 500 mg via ORAL
  Filled 2013-07-15: qty 2

## 2013-07-15 MED ORDER — LEVOFLOXACIN IN D5W 750 MG/150ML IV SOLN
750.0000 mg | INTRAVENOUS | Status: DC
  Administered 2013-07-15 – 2013-07-17 (×3): 750 mg via INTRAVENOUS
  Filled 2013-07-15 (×5): qty 150

## 2013-07-15 MED ORDER — GERITOL COMPLETE PO TABS
1.0000 | ORAL_TABLET | Freq: Every day | ORAL | Status: DC
Start: 2013-07-15 — End: 2013-07-15

## 2013-07-15 MED ORDER — POTASSIUM CHLORIDE IN NACL 40-0.9 MEQ/L-% IV SOLN
INTRAVENOUS | Status: DC
  Administered 2013-07-15 – 2013-07-17 (×4): via INTRAVENOUS
  Filled 2013-07-15 (×7): qty 1000

## 2013-07-15 MED ORDER — IOHEXOL 300 MG/ML  SOLN
100.0000 mL | Freq: Once | INTRAMUSCULAR | Status: AC | PRN
  Administered 2013-07-15: 100 mL via INTRAVENOUS

## 2013-07-15 MED ORDER — PANTOPRAZOLE SODIUM 40 MG IV SOLR
40.0000 mg | Freq: Once | INTRAVENOUS | Status: AC
  Administered 2013-07-15: 40 mg via INTRAVENOUS
  Filled 2013-07-15: qty 40

## 2013-07-15 MED ORDER — ADULT MULTIVITAMIN W/MINERALS CH
1.0000 | ORAL_TABLET | Freq: Every day | ORAL | Status: DC
  Administered 2013-07-16 – 2013-07-18 (×3): 1 via ORAL
  Filled 2013-07-15 (×3): qty 1

## 2013-07-15 MED ORDER — SODIUM CHLORIDE 0.9 % IV SOLN
INTRAVENOUS | Status: DC
  Administered 2013-07-15: 14:00:00 via INTRAVENOUS

## 2013-07-15 NOTE — ED Notes (Signed)
Patient Belongings: 1 pair [employer] pants 1 plaid shirt 1 hat 1 pair nike shoes 1 black belt

## 2013-07-15 NOTE — ED Notes (Addendum)
Patient in blue scrubs and socks. Patient and belongings both wanded by security.
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
## 2013-07-15 NOTE — ED Notes (Signed)
Pt reports he lost his father last week and was unable to go the funeral, pt reports he has been having thoughts of harming himself every since but has no specific plan at this time. Pt also reports 6/10 abdominal pain in his LLQ since 7/27 that radiates to his back. Pt reports n/v/d with a bloody stool this a.m. Pt also reports a hx of anemia. Pt denies CP and SOB at this time. AxO X4  And NAD noted at this time.

## 2013-07-15 NOTE — ED Notes (Addendum)
Report called to 5E. Pt initially reported SI, however after being interviewed by admission RN pt denied SI. Admitting unit requesting suicide sitter per policy until cleared by a psychiatry. Discussed pt and barrier to admission with Dr. Darnelle Catalan. Dr. Darnelle Catalan gave an order for suicide sitter until pt is cleared by psychiatry. Administrative coordinator aware of pt admission, however states she currently does not have an Science writer and will notify me when a sitter becomes available.   Update- 1642: Admitting unit called to report that they would utilize a floor NT to sit with the patient until a sitter becomes available.

## 2013-07-15 NOTE — ED Provider Notes (Signed)
CSN: [id number]     Arrival date & time 07/15/13  1100 History     First MD Initiated Contact with Patient 07/15/13 1128     Chief Complaint  Patient presents with  . Abdominal Pain    n/v/d  . Suicidal   (Consider location/radiation/quality/duration/timing/severity/associated sxs/prior Treatment) Patient is a 52 y.o. male presenting with abdominal pain. The history is provided by the patient.  Abdominal Pain Associated symptoms include abdominal pain. Pertinent negatives include no chest pain, no headaches and no shortness of breath.  pt c/o abd pain and nvd in past few days. Emesis, clear not bloody or bilious. Diarrhea watery. Has had 4-5 episodes of each. abd pain epigastric, dull, non radiating, constant, without specific exacerbating or alleviating factors. No fever or chills. No prior abd surgery. No hx pud. +hx substance abuse and pancreatitis. No gu c/o. Also c/o non prod cough in past week.      Past Medical History  Diagnosis Date  . Arthritis   . Substance abuse     Cocaine, marijuana, and alcohol  . Suicidal ideation 09/09/2012  . Chronic blood loss anemia 09/09/2012  . Rectal bleeding 09/08/2012  . Acute alcoholic pancreatitis 09/08/2012   Past Surgical History  Procedure Laterality Date  . Orthopedic surgery      Left foot surgery   Family History  Problem Relation Age of Onset  . Heart disease Mother   . Cancer Father     prostate   History  Substance Use Topics  . Smoking status: Former Smoker -- 0.50 packs/day    Types: Cigarettes  . Smokeless tobacco: Former Neurosurgeon    Quit date: 09/16/2012  . Alcohol Use: 28.8 oz/week    48 Cans of beer per week     Comment: x one week ago    Review of Systems  Constitutional: Negative for fever and chills.  HENT: Negative for neck pain.   Eyes: Negative for redness.  Respiratory: Positive for cough. Negative for shortness of breath.   Cardiovascular: Negative for chest pain.  Gastrointestinal: Positive for  vomiting, abdominal pain and diarrhea.  Genitourinary: Negative for flank pain.  Musculoskeletal: Negative for back pain.  Skin: Negative for rash.  Neurological: Negative for headaches.  Hematological: Does not bruise/bleed easily.  Psychiatric/Behavioral: Negative for confusion.    Allergies  Review of patient's allergies indicates no known allergies.  Home Medications   Current Outpatient Rx  Name  Route  Sig  Dispense  Refill  . Iron-Vitamins (GERITOL COMPLETE) TABS   Oral   Take 1 tablet by mouth daily.          BP 99/47  Temp(Src) 98.3 F (36.8 C) (Oral)  Resp 24  SpO2 97% Physical Exam  Nursing note and vitals reviewed. Constitutional: He is oriented to person, place, and time. He appears well-developed and well-nourished. No distress.  HENT:  Head: Atraumatic.  Eyes: Conjunctivae are normal. No scleral icterus.  Neck: Neck supple. No tracheal deviation present.  Cardiovascular: Normal rate, regular rhythm, normal heart sounds and intact distal pulses.   Pulmonary/Chest: Effort normal. No accessory muscle usage. No respiratory distress. He has rales.  Abdominal: Soft. Bowel sounds are normal. He exhibits no distension and no mass. There is tenderness. There is no rebound and no guarding.  Epigastric tenderness, no rebound or guarding.   Genitourinary:  No cva tenderness  Musculoskeletal: Normal range of motion. He exhibits no edema and no tenderness.  Neurological: He is alert and oriented to person, place,  and time.  Skin: Skin is warm and dry.  Psychiatric: He has a normal mood and affect.    ED Course   Procedures (including critical care time)  Results for orders placed during the hospital encounter of 07/15/13  CBC      Result Value Range   WBC 29.2 (*) 4.0 - 10.5 K/uL   RBC 4.85  4.22 - 5.81 MIL/uL   Hemoglobin 12.3 (*) 13.0 - 17.0 g/dL   HCT 45.4 (*) 09.8 - 11.9 %   MCV 74.4 (*) 78.0 - 100.0 fL   MCH 25.4 (*) 26.0 - 34.0 pg   MCHC 34.1  30.0 -  36.0 g/dL   RDW 14.7  82.9 - 56.2 %   Platelets 144 (*) 150 - 400 K/uL  COMPREHENSIVE METABOLIC PANEL      Result Value Range   Sodium 131 (*) 135 - 145 mEq/L   Potassium 3.4 (*) 3.5 - 5.1 mEq/L   Chloride 95 (*) 96 - 112 mEq/L   CO2 16 (*) 19 - 32 mEq/L   Glucose, Bld 113 (*) 70 - 99 mg/dL   BUN 53 (*) 6 - 23 mg/dL   Creatinine, Ser 1.30  0.50 - 1.35 mg/dL   Calcium 9.7  8.4 - 86.5 mg/dL   Total Protein 7.6  6.0 - 8.3 g/dL   Albumin 3.1 (*) 3.5 - 5.2 g/dL   AST 57 (*) 0 - 37 U/L   ALT 57 (*) 0 - 53 U/L   Alkaline Phosphatase 207 (*) 39 - 117 U/L   Total Bilirubin 0.4  0.3 - 1.2 mg/dL   GFR calc non Af Amer 75 (*) >90 mL/min   GFR calc Af Amer 87 (*) >90 mL/min  LIPASE, BLOOD      Result Value Range   Lipase 23  11 - 59 U/L  URINE RAPID DRUG SCREEN (HOSP PERFORMED)      Result Value Range   Opiates NONE DETECTED  NONE DETECTED   Cocaine NONE DETECTED  NONE DETECTED   Benzodiazepines NONE DETECTED  NONE DETECTED   Amphetamines NONE DETECTED  NONE DETECTED   Tetrahydrocannabinol NONE DETECTED  NONE DETECTED   Barbiturates NONE DETECTED  NONE DETECTED  URINALYSIS, ROUTINE W REFLEX MICROSCOPIC      Result Value Range   Color, Urine AMBER (*) YELLOW   APPearance CLOUDY (*) CLEAR   Specific Gravity, Urine 1.025  1.005 - 1.030   pH 5.0  5.0 - 8.0   Glucose, UA NEGATIVE  NEGATIVE mg/dL   Hgb urine dipstick TRACE (*) NEGATIVE   Bilirubin Urine NEGATIVE  NEGATIVE   Ketones, ur NEGATIVE  NEGATIVE mg/dL   Protein, ur 30 (*) NEGATIVE mg/dL   Urobilinogen, UA 0.2  0.0 - 1.0 mg/dL   Nitrite NEGATIVE  NEGATIVE   Leukocytes, UA LARGE (*) NEGATIVE  ETHANOL      Result Value Range   Alcohol, Ethyl (B) <11  0 - 11 mg/dL  URINE MICROSCOPIC-ADD ON      Result Value Range   Squamous Epithelial / LPF RARE  RARE   WBC, UA 11-20  <3 WBC/hpf   Bacteria, UA FEW (*) RARE   Casts HYALINE CASTS (*) NEGATIVE   Dg Chest 2 View  07/15/2013   *RADIOLOGY REPORT*  Clinical Data: Chest pain, cough,  ex-smoker  CHEST - 2 VIEW  Comparison: 10/07/2012  Findings:  Cardiomediastinal silhouette is stable.  There is mass- like consolidation in the left hilum/perihilar region with some air  bronchogram measures at least 7.7 cm.  Although this may be due to focal pneumonia lung mass cannot be excluded.  Further evaluation with CT scan of the chest with IV contrast is recommended.  Right lung is clear.  No pulmonary edema.  IMPRESSION:  There is mass-like consolidation in the left hilum/perihilar region with some air bronchogram measures at least 7.7 cm. Although this may be due to focal pneumonia lung mass cannot be excluded.  Further evaluation with CT scan of the chest with IV contrast is recommended.  Right lung is clear.  No pulmonary edema.   Original Report Authenticated By: Natasha Mead, M.D.      MDM  Iv ns bolus. zofran iv. Dilaudid iv. protonix iv.   Reviewed nursing notes and prior charts for additional history.   cxr w infiltrate.  Rocephin and zithromax given.  Radiologist rec ct,  Given chest findings on cxr, elev wbc, and pt c/o abd pain, nv, will get ct  Chest/abd/pelvis.  Additional ivf.  Recheck pt abd soft nt.     Suzi Roots, MD 07/15/13 [phone]

## 2013-07-15 NOTE — ED Notes (Signed)
Pt reports SI, no plan at this time.  Pt states his father passed away last week, he's in [place] and was not able to go see him.

## 2013-07-15 NOTE — ED Notes (Signed)
Pt reports abd pain with n/v/d x 6 days.  Pt also reports sore throat and weakness.

## 2013-07-15 NOTE — ED Notes (Signed)
Bed:WA04<BR> Expected date:<BR> Expected time:<BR> Means of arrival:<BR> Comments:<BR>

## 2013-07-16 DIAGNOSIS — R109 Unspecified abdominal pain: Secondary | ICD-10-CM

## 2013-07-16 DIAGNOSIS — E279 Disorder of adrenal gland, unspecified: Secondary | ICD-10-CM

## 2013-07-16 DIAGNOSIS — E871 Hypo-osmolality and hyponatremia: Secondary | ICD-10-CM

## 2013-07-16 DIAGNOSIS — D649 Anemia, unspecified: Secondary | ICD-10-CM

## 2013-07-16 DIAGNOSIS — E876 Hypokalemia: Secondary | ICD-10-CM

## 2013-07-16 DIAGNOSIS — E872 Acidosis: Secondary | ICD-10-CM

## 2013-07-16 DIAGNOSIS — E860 Dehydration: Secondary | ICD-10-CM

## 2013-07-16 DIAGNOSIS — F4321 Adjustment disorder with depressed mood: Secondary | ICD-10-CM

## 2013-07-16 LAB — BASIC METABOLIC PANEL
BUN: 38 mg/dL — ABNORMAL HIGH (ref 6–23)
CO2: 20 mEq/L (ref 19–32)
Chloride: 107 mEq/L (ref 96–112)
Glucose, Bld: 141 mg/dL — ABNORMAL HIGH (ref 70–99)
Potassium: 3.2 mEq/L — ABNORMAL LOW (ref 3.5–5.1)

## 2013-07-16 LAB — EXPECTORATED SPUTUM ASSESSMENT W GRAM STAIN, RFLX TO RESP C: Special Requests: NORMAL

## 2013-07-16 LAB — URINE CULTURE: Colony Count: 45000

## 2013-07-16 LAB — LEGIONELLA ANTIGEN, URINE

## 2013-07-16 LAB — STREP PNEUMONIAE URINARY ANTIGEN: Strep Pneumo Urinary Antigen: NEGATIVE

## 2013-07-16 MED ORDER — FAMOTIDINE 40 MG PO TABS
40.0000 mg | ORAL_TABLET | Freq: Every day | ORAL | Status: DC
  Administered 2013-07-16 – 2013-07-18 (×3): 40 mg via ORAL
  Filled 2013-07-16 (×3): qty 1

## 2013-07-16 MED ORDER — SUCRALFATE 1 GM/10ML PO SUSP
1.0000 g | Freq: Three times a day (TID) | ORAL | Status: DC
  Administered 2013-07-16 – 2013-07-18 (×8): 1 g via ORAL
  Filled 2013-07-16 (×12): qty 10

## 2013-07-16 MED ORDER — ENSURE COMPLETE PO LIQD
237.0000 mL | Freq: Three times a day (TID) | ORAL | Status: DC
  Administered 2013-07-16 – 2013-07-18 (×5): 237 mL via ORAL

## 2013-07-16 NOTE — Progress Notes (Signed)
[facility] PROGRESS NOTE  Phillip HAEGELE Kemp:[id number] DOB: 06-29-1961 DOA: 07/15/2013 PCP: Milinda Antis, MD  Assessment/Plan: Aspiration pneumonia  -Patient will be continue on empiric Levaquin.  -follow blood cultures, sputum culture, urinary cx's and  Legionella antigen  -strep pneumonia antigen negative  -HIV test non reactive -Pulmonary toilet and supplemental oxygen if needed.  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  -due to dehydration  -continue IVF's -will repeat lactic acid after hydration  Elevated LFTs  -Monitor. 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   Microcytic anemia  -History of chronic blood loss anemia. Hemoglobin stable at 12.3; probably hemoconcentration as patient dehydrated -will follow Hgb trend -check hemoccults cards   Thrombocytopenia  -Likely secondary to acute illness.  -no signs of acute bleeding -continue SCD's and avoid heparin products  Thyromegaly  -Incidentally discovered.  -TSH is low; could be secondary to to sick thyroid syndrome or thyroiditis. -will check free T4  Adrenal nodule, left  -Appears stable on serial imaging. Consider outpatient MRI for further characterization.   Nausea, vomiting, diarrhea  -GI pathogen panel requested. Will continue PRN anti-emetics   -No evidence of recurrent pancreatitis. -lipase WNL -will start Pepcid and carafate   Suicidal ideation  -Likely from an acute grief reaction from the death of his father.  -24 hour sitter requested and psychiatric consultation requested.  -will follow rec's from psych  Protein calorie malnutrition  (severe) -started on ensure -continue MV -encourage PO and follow nutrition rec's  DVT: SCD's     Code Status: Full Family Communication: no family at bedside Disposition Plan: to be determine; continue inpatient treatment   Consultants: Psych Nutritional service  Procedures:  See below for x-ray report  Antibiotics:  levaquin  HPI/Subjective: Feeling better, denies SI, but feels depressed; afebrile and described his BM are forming.  Objective: Filed Vitals:   07/15/13 1333 07/15/13 1700 07/15/13 2204 07/16/13 0545  BP:  118/60 104/59 98/54  Pulse: 107 105 101 94  Temp:  97.9 F (36.6 C) 98.1 F (36.7 C) 98.1 F (36.7 C)  TempSrc:  Oral Oral Oral  Resp:  21 20 18   Height:  6\' 1"  (1.854 m)    Weight:  58.514 kg (129 lb)    SpO2: 99% 100% 100% 98%    Intake/Output Summary (Last 24 hours) at 07/16/13 1138 Last data filed at 07/16/13 0900  Gross per 24 hour  Intake 1741.25 ml  Output      0 ml  Net 1741.25 ml   Filed Weights   07/15/13 1700  Weight: 58.514 kg (129 lb)    Exam:   General:  NAD, afebrile; feeling better  Cardiovascular: S1 and s2, no rubs or gallops  Respiratory: diffuse rhonchi, no wheezing  Abdomen: soft, epigastric discomfort, no distension; positive BS  Musculoskeletal: no edema, no erythema and no cyanosis  Data Reviewed: Basic Metabolic Panel:  Recent Labs Lab 07/15/13 1150 07/16/13 0803  NA 131* 136  K 3.4* 3.2*  CL 95* 107  CO2 16* 20  GLUCOSE 113* 141*  BUN 53* 38*  CREATININE 1.10 0.78  CALCIUM 9.7 9.8   Liver Function Tests:  Recent Labs Lab 07/15/13 1150  AST  57*  ALT 57*  ALKPHOS 207*  BILITOT 0.4  PROT 7.6  ALBUMIN 3.1*    Recent Labs Lab 07/15/13 1150  LIPASE 23   CBC:  Recent Labs Lab 07/15/13 1150  WBC 29.2*  HGB 12.3*  HCT 36.1*  MCV 74.4*  PLT 144*   Studies: Dg Chest 2 View  07/15/2013   *RADIOLOGY REPORT*  Clinical Data: Chest pain, cough, ex-smoker  CHEST - 2 VIEW   Comparison: 10/07/2012  Findings:  Cardiomediastinal silhouette is stable.  There is mass- like consolidation in the left hilum/perihilar region with some air bronchogram measures at least 7.7 cm.  Although this may be due to focal pneumonia lung mass cannot be excluded.  Further evaluation with CT scan of the chest with IV contrast is recommended.  Right lung is clear.  No pulmonary edema.  IMPRESSION:  There is mass-like consolidation in the left hilum/perihilar region with some air bronchogram measures at least 7.7 cm. Although this may be due to focal pneumonia lung mass cannot be excluded.  Further evaluation with CT scan of the chest with IV contrast is recommended.  Right lung is clear.  No pulmonary edema.   Original Report Authenticated By: Natasha Mead, M.D.   Ct Chest W Contrast  07/15/2013   *RADIOLOGY REPORT*  Clinical Data:  Chest congestion  CT CHEST, ABDOMEN AND PELVIS WITH CONTRAST  Technique:  Multidetector CT imaging of the chest, abdomen and pelvis was performed following the standard protocol during bolus administration of intravenous contrast.  Contrast: OMNIPAQUE IOHEXOL 300 MG/ML  SOLN  Comparison:  09/24/2012  CT CHEST  Findings:  No pleural effusion identified.  There is a rounded opacity within the superior segment of the left lower lobe measuring 4.7 x 3.9 cm, image 36/series six.  The right lung appears clear.  The heart size appears within normal limits. There is no pericardial effusion.  There is an AP window lymph node which is enlarged measuring 1.3 cm, image 25/series 2.  This is compared with 1 cm previously.  Prominent axillary lymph nodes are again noted.  Within the left axilla there is a 1.1 cm lymph node, image 13/series 2.  This is unchanged from previous exam.  Within the right axilla there is a stable 8 mm lymph node, image 13/series 2.  There is marked, uniform enlargement of the entire thyroid gland.  Review of the visualized osseous structures is significant for  mild spondylosis within the thoracic spine.  No aggressive lytic or sclerotic bone lesions.  IMPRESSION:  1.  Rounded airspace consolidation within the superior segment of the left lower lobe is identified.  In the acute setting this likely represents a pneumonia.  Recommend follow-up imaging after appropriate antibiotic therapy.  If this persists after appropriate therapy then further assessment with tissue sampling and/or PET CT would be advised.  If there are no symptoms of pneumonia then consider proceeding with tissue sampling and/or PET CT. 2.  Enlarged AP window lymph node is nonspecific.  In the setting of infection this may be reactive in etiology.  Metastatic adenopathy is not excluded. 3.  Diffuse enlargement of the thyroid gland.  Advise further evaluation with TSH.  CT ABDOMEN AND PELVIS  Findings:  Stable 9mm enhancing structure within the lateral aspect of the right hepatic lobe, image 66/series 2.  The gallbladder appears normal.  The increased caliber of the common bile duct which measures 1.2 cm, image 28/series 3.  This is unchanged from previous exam.  No  obstructing stone or mass noted. The pancreatic duct appears normal.  The pancreas is negative. Normal appearance of the spleen.  The right adrenal gland is unremarkable.  There is a nodule in the left adrenal gland measuring 2.8 x 2.2 cm, image 61/series 2.  This is unchanged from previous exam.  The right kidney appears normal.  The left kidney is also normal. The urinary bladder appears within normal limits.  Calcifications within the prostate gland identified.  The seminal vesicles are unremarkable.  The abdominal aorta has a normal caliber.  No aneurysm.  Mild calcified atherosclerotic change noted.  No upper abdominal adenopathy.  There is no pelvic or inguinal adenopathy identified.  The stomach is normal.  The small bowel loops are unremarkable. The appendix is visualized and appears within normal limits. Normal appearance of the colon.   No free fluid or fluid collections identified within the abdomen or pelvis.  No free fluid or abnormal fluid collections within the abdomen or pelvis.  The visualized osseous structures are remarkable for mild lumbar spondylosis.  IMPRESSION:  1.  No acute findings within the abdomen or pelvis. 2.  Stable tiny hypervascular lesion in the right hepatic lobe. 3.  Stable left adrenal nodule.  This remains nonspecific and indeterminate.  Further characterization with MRI may be helpful. 4.  Increased caliber of the common bile duct appears similar to previous exam.  No obstructing stone or mass noted.   Original Report Authenticated By: Signa Kell, M.D.   Ct Abdomen Pelvis W Contrast  07/15/2013   *RADIOLOGY REPORT*  Clinical Data:  Chest congestion  CT CHEST, ABDOMEN AND PELVIS WITH CONTRAST  Technique:  Multidetector CT imaging of the chest, abdomen and pelvis was performed following the standard protocol during bolus administration of intravenous contrast.  Contrast: OMNIPAQUE IOHEXOL 300 MG/ML  SOLN  Comparison:  09/24/2012  CT CHEST  Findings:  No pleural effusion identified.  There is a rounded opacity within the superior segment of the left lower lobe measuring 4.7 x 3.9 cm, image 36/series six.  The right lung appears clear.  The heart size appears within normal limits. There is no pericardial effusion.  There is an AP window lymph node which is enlarged measuring 1.3 cm, image 25/series 2.  This is compared with 1 cm previously.  Prominent axillary lymph nodes are again noted.  Within the left axilla there is a 1.1 cm lymph node, image 13/series 2.  This is unchanged from previous exam.  Within the right axilla there is a stable 8 mm lymph node, image 13/series 2.  There is marked, uniform enlargement of the entire thyroid gland.  Review of the visualized osseous structures is significant for mild spondylosis within the thoracic spine.  No aggressive lytic or sclerotic bone lesions.  IMPRESSION:  1.   Rounded airspace consolidation within the superior segment of the left lower lobe is identified.  In the acute setting this likely represents a pneumonia.  Recommend follow-up imaging after appropriate antibiotic therapy.  If this persists after appropriate therapy then further assessment with tissue sampling and/or PET CT would be advised.  If there are no symptoms of pneumonia then consider proceeding with tissue sampling and/or PET CT. 2.  Enlarged AP window lymph node is nonspecific.  In the setting of infection this may be reactive in etiology.  Metastatic adenopathy is not excluded. 3.  Diffuse enlargement of the thyroid gland.  Advise further evaluation with TSH.  CT ABDOMEN AND PELVIS  Findings:  Stable  9mm enhancing structure within the lateral aspect of the right hepatic lobe, image 66/series 2.  The gallbladder appears normal.  The increased caliber of the common bile duct which measures 1.2 cm, image 28/series 3.  This is unchanged from previous exam.  No obstructing stone or mass noted. The pancreatic duct appears normal.  The pancreas is negative. Normal appearance of the spleen.  The right adrenal gland is unremarkable.  There is a nodule in the left adrenal gland measuring 2.8 x 2.2 cm, image 61/series 2.  This is unchanged from previous exam.  The right kidney appears normal.  The left kidney is also normal. The urinary bladder appears within normal limits.  Calcifications within the prostate gland identified.  The seminal vesicles are unremarkable.  The abdominal aorta has a normal caliber.  No aneurysm.  Mild calcified atherosclerotic change noted.  No upper abdominal adenopathy.  There is no pelvic or inguinal adenopathy identified.  The stomach is normal.  The small bowel loops are unremarkable. The appendix is visualized and appears within normal limits. Normal appearance of the colon.  No free fluid or fluid collections identified within the abdomen or pelvis.  No free fluid or abnormal fluid  collections within the abdomen or pelvis.  The visualized osseous structures are remarkable for mild lumbar spondylosis.  IMPRESSION:  1.  No acute findings within the abdomen or pelvis. 2.  Stable tiny hypervascular lesion in the right hepatic lobe. 3.  Stable left adrenal nodule.  This remains nonspecific and indeterminate.  Further characterization with MRI may be helpful. 4.  Increased caliber of the common bile duct appears similar to previous exam.  No obstructing stone or mass noted.   Original Report Authenticated By: Signa Kell, M.D.    Scheduled Meds: . famotidine  40 mg Oral Daily  . feeding supplement  237 mL Oral TID BM  . levofloxacin (LEVAQUIN) IV  750 mg Intravenous Q24H  . multivitamin with minerals  1 tablet Oral Daily  . sucralfate  1 g Oral TID WC & HS   Continuous Infusions: . 0.9 % NaCl with KCl 40 mEq / L 75 mL/hr at 07/15/13 1835    Principal Problem:   Aspiration pneumonia Active Problems:   Hyponatremia   Hypokalemia   Metabolic acidosis   Elevated LFTs   Microcytic anemia   Thrombocytopenia   Thyromegaly   Adrenal nodule, left    Time spent: >30 minutes   Phillip Kemp  [facility] Pager [phone]. If 7PM-7AM, please contact night-coverage at [email or web address], password [facility] 07/16/2013, 11:38 AM  LOS: 1 day

## 2013-07-16 NOTE — Progress Notes (Signed)
Patient complains of chest discomfort, possibly indigestion. 12 lead ekg done with results of normal sinus rhythm.

## 2013-07-16 NOTE — Consult Note (Signed)
Reason for Consult: Suicidal thoughts Referring Physician: Dr. Carolee Rota Phillip Kemp is an 52 y.o. male.  HPI: Patient is a 52 year old African American man who was admitted on the medical floor for pneumonia.  Consult was called as the patient expressed suicidal thoughts.  The patient told his father died one week ago in [place].  His father was suffering from prostrate cancer.  Since then patient is experiencing increased depression and he felt himself in a severe shock.  The patient expressed crying spells, poor sleep, irritability and having dreams about him.  Patient admitted initially he thought that he wanted to join him.  He also reported hearing voices about his father and his mother.  He admitted having suicidal thoughts.  He started to feel physically sick and did not seek any medical attention until he recently developed pneumonia.  Patient told that he has been crying for past few days.  However he is feeling somewhat better.  He is visiting from his friends and he is feeling more optimistic about his life.  He denies any agitation, anger, paranoia or any homicidal thoughts.  Patient lives in a recovery home.  He has a long history of alcohol and drug use. His last use was 6 months ago.  He has 2 children.  His wife died due to leukemia.  He has no other siblings around.  However he has good support from his friends.  His son goes to college who lives in [place].  Patient is on disability.  He feels his appetite coming back.  He endorse that after the initial shock by losing his father, he is coming to more realistic thinking.  Patient told he wants to get better.  He liked to get counseling and we'll consider antidepressant to help his depression.  Past psychiatric history Patient has no prior history of suicidal attempt or psychiatric inpatient treatment.  He has never seen a psychiatrist other than getting help for his drug and alcohol program.  He claims to be sober for past 6  months.  Patient denies any history of mania or psychosis.  Family history Patient denies any history of family psychiatric illness.  Past Medical History  Diagnosis Date  . Arthritis   . Substance abuse     Cocaine, marijuana, and alcohol  . Suicidal ideation 09/09/2012  . Chronic blood loss anemia 09/09/2012  . Rectal bleeding 09/08/2012  . Acute alcoholic pancreatitis 09/08/2012    Past Surgical History  Procedure Laterality Date  . Orthopedic surgery      Left foot surgery  . Left foot surgery    . Left cataract extraction      Family History  Problem Relation Age of Onset  . Heart disease Mother   . Cancer Father     prostate    Social History:  reports that he has quit smoking. His smoking use included Cigarettes. He smoked 0.50 packs per day. He quit smokeless tobacco use about 9 months ago. He reports that he drinks about 28.8 ounces of alcohol per week. He reports that he uses illicit drugs (Cocaine).  Allergies: No Known Allergies  Medications: I have reviewed the patient's current medications.  Results for orders placed during the hospital encounter of 07/15/13 (from the past 48 hour(s))  CBC     Status: Abnormal   Collection Time    07/15/13 11:50 AM      Result Value Range   WBC 29.2 (*) 4.0 - 10.5 K/uL   Comment:  ADJUSTED FOR NUCLEATED RBC'S     WHITE COUNT CONFIRMED ON SMEAR   RBC 4.85  4.22 - 5.81 MIL/uL   Hemoglobin 12.3 (*) 13.0 - 17.0 g/dL   HCT 16.1 (*) 09.6 - 04.5 %   MCV 74.4 (*) 78.0 - 100.0 fL   MCH 25.4 (*) 26.0 - 34.0 pg   MCHC 34.1  30.0 - 36.0 g/dL   RDW 40.9  81.1 - 91.4 %   Platelets 144 (*) 150 - 400 K/uL  COMPREHENSIVE METABOLIC PANEL     Status: Abnormal   Collection Time    07/15/13 11:50 AM      Result Value Range   Sodium 131 (*) 135 - 145 mEq/L   Potassium 3.4 (*) 3.5 - 5.1 mEq/L   Chloride 95 (*) 96 - 112 mEq/L   CO2 16 (*) 19 - 32 mEq/L   Glucose, Bld 113 (*) 70 - 99 mg/dL   BUN 53 (*) 6 - 23 mg/dL   Creatinine, Ser  7.82  0.50 - 1.35 mg/dL   Calcium 9.7  8.4 - 95.6 mg/dL   Total Protein 7.6  6.0 - 8.3 g/dL   Albumin 3.1 (*) 3.5 - 5.2 g/dL   AST 57 (*) 0 - 37 U/L   ALT 57 (*) 0 - 53 U/L   Alkaline Phosphatase 207 (*) 39 - 117 U/L   Total Bilirubin 0.4  0.3 - 1.2 mg/dL   GFR calc non Af Amer 75 (*) >90 mL/min   GFR calc Af Amer 87 (*) >90 mL/min   Comment:            The eGFR has been calculated     using the CKD EPI equation.     This calculation has not been     validated in all clinical     situations.     eGFR's persistently     <90 mL/min signify     possible Chronic Kidney Disease.  LIPASE, BLOOD     Status: None   Collection Time    07/15/13 11:50 AM      Result Value Range   Lipase 23  11 - 59 U/L  ETHANOL     Status: None   Collection Time    07/15/13 11:50 AM      Result Value Range   Alcohol, Ethyl (B) <11  0 - 11 mg/dL   Comment:            LOWEST DETECTABLE LIMIT FOR     SERUM ALCOHOL IS 11 mg/dL     FOR MEDICAL PURPOSES ONLY  URINALYSIS, ROUTINE W REFLEX MICROSCOPIC     Status: Abnormal   Collection Time    07/15/13 11:56 AM      Result Value Range   Color, Urine AMBER (*) YELLOW   Comment: BIOCHEMICALS MAY BE AFFECTED BY COLOR   APPearance CLOUDY (*) CLEAR   Specific Gravity, Urine 1.025  1.005 - 1.030   pH 5.0  5.0 - 8.0   Glucose, UA NEGATIVE  NEGATIVE mg/dL   Hgb urine dipstick TRACE (*) NEGATIVE   Bilirubin Urine NEGATIVE  NEGATIVE   Ketones, ur NEGATIVE  NEGATIVE mg/dL   Protein, ur 30 (*) NEGATIVE mg/dL   Urobilinogen, UA 0.2  0.0 - 1.0 mg/dL   Nitrite NEGATIVE  NEGATIVE   Leukocytes, UA LARGE (*) NEGATIVE  URINE MICROSCOPIC-ADD ON     Status: Abnormal   Collection Time    07/15/13 11:56 AM  Result Value Range   Squamous Epithelial / LPF RARE  RARE   WBC, UA 11-20  <3 WBC/hpf   Bacteria, UA FEW (*) RARE   Casts HYALINE CASTS (*) NEGATIVE  URINE CULTURE     Status: None   Collection Time    07/15/13 11:56 AM      Result Value Range   Specimen  Description URINE, CLEAN CATCH     Special Requests NONE     Culture  Setup Time 07/15/2013 19:49     Colony Count 45,000 COLONIES/ML     Culture       Value: Multiple bacterial morphotypes present, none predominant. Suggest appropriate recollection if clinically indicated.   Report Status 07/16/2013 FINAL    URINE RAPID DRUG SCREEN (HOSP PERFORMED)     Status: None   Collection Time    07/15/13 11:57 AM      Result Value Range   Opiates NONE DETECTED  NONE DETECTED   Cocaine NONE DETECTED  NONE DETECTED   Benzodiazepines NONE DETECTED  NONE DETECTED   Amphetamines NONE DETECTED  NONE DETECTED   Tetrahydrocannabinol NONE DETECTED  NONE DETECTED   Barbiturates NONE DETECTED  NONE DETECTED   Comment:            DRUG SCREEN FOR MEDICAL PURPOSES     ONLY.  IF CONFIRMATION IS NEEDED     FOR ANY PURPOSE, NOTIFY LAB     WITHIN 5 DAYS.                LOWEST DETECTABLE LIMITS     FOR URINE DRUG SCREEN     Drug Class       Cutoff (ng/mL)     Amphetamine      1000     Barbiturate      200     Benzodiazepine   200     Tricyclics       300     Opiates          300     Cocaine          300     THC              50  LACTIC ACID, PLASMA     Status: Abnormal   Collection Time    07/15/13  5:21 PM      Result Value Range   Lactic Acid, Venous 2.8 (*) 0.5 - 2.2 mmol/L  CULTURE, BLOOD (ROUTINE X 2)     Status: None   Collection Time    07/15/13  5:55 PM      Result Value Range   Specimen Description BLOOD LEFT ARM     Special Requests BOTTLES DRAWN AEROBIC ONLY 10CC     Culture  Setup Time 07/15/2013 22:37     Culture       Value:        BLOOD CULTURE RECEIVED NO GROWTH TO DATE CULTURE WILL BE HELD FOR 5 DAYS BEFORE ISSUING A FINAL NEGATIVE REPORT   Report Status PENDING    KETONES, QUALITATIVE     Status: None   Collection Time    07/15/13  6:10 PM      Result Value Range   Acetone, Bld NEGATIVE  NEGATIVE  CULTURE, BLOOD (ROUTINE X 2)     Status: None   Collection Time    07/15/13   6:10 PM      Result Value Range   Specimen Description BLOOD LEFT ARM  Special Requests BOTTLES DRAWN AEROBIC AND ANAEROBIC 10CC     Culture  Setup Time 07/15/2013 22:37     Culture       Value:        BLOOD CULTURE RECEIVED NO GROWTH TO DATE CULTURE WILL BE HELD FOR 5 DAYS BEFORE ISSUING A FINAL NEGATIVE REPORT   Report Status PENDING    HIV ANTIBODY (ROUTINE TESTING)     Status: None   Collection Time    07/15/13  6:15 PM      Result Value Range   HIV NON REACTIVE  NON REACTIVE  TSH     Status: Abnormal   Collection Time    07/15/13  6:15 PM      Result Value Range   TSH 0.009 (*) 0.350 - 4.500 uIU/mL  LEGIONELLA ANTIGEN, URINE     Status: None   Collection Time    07/15/13 11:09 PM      Result Value Range   Specimen Description URINE, CLEAN CATCH     Special Requests NONE     Legionella Antigen, Urine Negative for Legionella pneumophilia serogroup 1     Report Status 07/16/2013 FINAL    STREP PNEUMONIAE URINARY ANTIGEN     Status: None   Collection Time    07/15/13 11:09 PM      Result Value Range   Strep Pneumo Urinary Antigen NEGATIVE  NEGATIVE   Comment:            Infection due to S. pneumoniae     cannot be absolutely ruled out     since the antigen present     may be below the detection limit     of the test.  BASIC METABOLIC PANEL     Status: Abnormal   Collection Time    07/16/13  8:03 AM      Result Value Range   Sodium 136  135 - 145 mEq/L   Potassium 3.2 (*) 3.5 - 5.1 mEq/L   Chloride 107  96 - 112 mEq/L   Comment: DELTA CHECK NOTED   CO2 20  19 - 32 mEq/L   Glucose, Bld 141 (*) 70 - 99 mg/dL   BUN 38 (*) 6 - 23 mg/dL   Creatinine, Ser 4.74  0.50 - 1.35 mg/dL   Calcium 9.8  8.4 - 25.9 mg/dL   GFR calc non Af Amer >90  >90 mL/min   GFR calc Af Amer >90  >90 mL/min   Comment:            The eGFR has been calculated     using the CKD EPI equation.     This calculation has not been     validated in all clinical     situations.     eGFR's  persistently     <90 mL/min signify     possible Chronic Kidney Disease.    Dg Chest 2 View  07/15/2013   *RADIOLOGY REPORT*  Clinical Data: Chest pain, cough, ex-smoker  CHEST - 2 VIEW  Comparison: 10/07/2012  Findings:  Cardiomediastinal silhouette is stable.  There is mass- like consolidation in the left hilum/perihilar region with some air bronchogram measures at least 7.7 cm.  Although this may be due to focal pneumonia lung mass cannot be excluded.  Further evaluation with CT scan of the chest with IV contrast is recommended.  Right lung is clear.  No pulmonary edema.  IMPRESSION:  There is mass-like consolidation in the left hilum/perihilar region  with some air bronchogram measures at least 7.7 cm. Although this may be due to focal pneumonia lung mass cannot be excluded.  Further evaluation with CT scan of the chest with IV contrast is recommended.  Right lung is clear.  No pulmonary edema.   Original Report Authenticated By: Natasha Mead, M.D.   Ct Chest W Contrast  07/15/2013   *RADIOLOGY REPORT*  Clinical Data:  Chest congestion  CT CHEST, ABDOMEN AND PELVIS WITH CONTRAST  Technique:  Multidetector CT imaging of the chest, abdomen and pelvis was performed following the standard protocol during bolus administration of intravenous contrast.  Contrast: OMNIPAQUE IOHEXOL 300 MG/ML  SOLN  Comparison:  09/24/2012  CT CHEST  Findings:  No pleural effusion identified.  There is a rounded opacity within the superior segment of the left lower lobe measuring 4.7 x 3.9 cm, image 36/series six.  The right lung appears clear.  The heart size appears within normal limits. There is no pericardial effusion.  There is an AP window lymph node which is enlarged measuring 1.3 cm, image 25/series 2.  This is compared with 1 cm previously.  Prominent axillary lymph nodes are again noted.  Within the left axilla there is a 1.1 cm lymph node, image 13/series 2.  This is unchanged from previous exam.  Within the right  axilla there is a stable 8 mm lymph node, image 13/series 2.  There is marked, uniform enlargement of the entire thyroid gland.  Review of the visualized osseous structures is significant for mild spondylosis within the thoracic spine.  No aggressive lytic or sclerotic bone lesions.  IMPRESSION:  1.  Rounded airspace consolidation within the superior segment of the left lower lobe is identified.  In the acute setting this likely represents a pneumonia.  Recommend follow-up imaging after appropriate antibiotic therapy.  If this persists after appropriate therapy then further assessment with tissue sampling and/or PET CT would be advised.  If there are no symptoms of pneumonia then consider proceeding with tissue sampling and/or PET CT. 2.  Enlarged AP window lymph node is nonspecific.  In the setting of infection this may be reactive in etiology.  Metastatic adenopathy is not excluded. 3.  Diffuse enlargement of the thyroid gland.  Advise further evaluation with TSH.  CT ABDOMEN AND PELVIS  Findings:  Stable 9mm enhancing structure within the lateral aspect of the right hepatic lobe, image 66/series 2.  The gallbladder appears normal.  The increased caliber of the common bile duct which measures 1.2 cm, image 28/series 3.  This is unchanged from previous exam.  No obstructing stone or mass noted. The pancreatic duct appears normal.  The pancreas is negative. Normal appearance of the spleen.  The right adrenal gland is unremarkable.  There is a nodule in the left adrenal gland measuring 2.8 x 2.2 cm, image 61/series 2.  This is unchanged from previous exam.  The right kidney appears normal.  The left kidney is also normal. The urinary bladder appears within normal limits.  Calcifications within the prostate gland identified.  The seminal vesicles are unremarkable.  The abdominal aorta has a normal caliber.  No aneurysm.  Mild calcified atherosclerotic change noted.  No upper abdominal adenopathy.  There is no pelvic  or inguinal adenopathy identified.  The stomach is normal.  The small bowel loops are unremarkable. The appendix is visualized and appears within normal limits. Normal appearance of the colon.  No free fluid or fluid collections identified within the abdomen or pelvis.  No free fluid or abnormal fluid collections within the abdomen or pelvis.  The visualized osseous structures are remarkable for mild lumbar spondylosis.  IMPRESSION:  1.  No acute findings within the abdomen or pelvis. 2.  Stable tiny hypervascular lesion in the right hepatic lobe. 3.  Stable left adrenal nodule.  This remains nonspecific and indeterminate.  Further characterization with MRI may be helpful. 4.  Increased caliber of the common bile duct appears similar to previous exam.  No obstructing stone or mass noted.   Original Report Authenticated By: Signa Kell, M.D.   Ct Abdomen Pelvis W Contrast  07/15/2013   *RADIOLOGY REPORT*  Clinical Data:  Chest congestion  CT CHEST, ABDOMEN AND PELVIS WITH CONTRAST  Technique:  Multidetector CT imaging of the chest, abdomen and pelvis was performed following the standard protocol during bolus administration of intravenous contrast.  Contrast: OMNIPAQUE IOHEXOL 300 MG/ML  SOLN  Comparison:  09/24/2012  CT CHEST  Findings:  No pleural effusion identified.  There is a rounded opacity within the superior segment of the left lower lobe measuring 4.7 x 3.9 cm, image 36/series six.  The right lung appears clear.  The heart size appears within normal limits. There is no pericardial effusion.  There is an AP window lymph node which is enlarged measuring 1.3 cm, image 25/series 2.  This is compared with 1 cm previously.  Prominent axillary lymph nodes are again noted.  Within the left axilla there is a 1.1 cm lymph node, image 13/series 2.  This is unchanged from previous exam.  Within the right axilla there is a stable 8 mm lymph node, image 13/series 2.  There is marked, uniform enlargement of the  entire thyroid gland.  Review of the visualized osseous structures is significant for mild spondylosis within the thoracic spine.  No aggressive lytic or sclerotic bone lesions.  IMPRESSION:  1.  Rounded airspace consolidation within the superior segment of the left lower lobe is identified.  In the acute setting this likely represents a pneumonia.  Recommend follow-up imaging after appropriate antibiotic therapy.  If this persists after appropriate therapy then further assessment with tissue sampling and/or PET CT would be advised.  If there are no symptoms of pneumonia then consider proceeding with tissue sampling and/or PET CT. 2.  Enlarged AP window lymph node is nonspecific.  In the setting of infection this may be reactive in etiology.  Metastatic adenopathy is not excluded. 3.  Diffuse enlargement of the thyroid gland.  Advise further evaluation with TSH.  CT ABDOMEN AND PELVIS  Findings:  Stable 9mm enhancing structure within the lateral aspect of the right hepatic lobe, image 66/series 2.  The gallbladder appears normal.  The increased caliber of the common bile duct which measures 1.2 cm, image 28/series 3.  This is unchanged from previous exam.  No obstructing stone or mass noted. The pancreatic duct appears normal.  The pancreas is negative. Normal appearance of the spleen.  The right adrenal gland is unremarkable.  There is a nodule in the left adrenal gland measuring 2.8 x 2.2 cm, image 61/series 2.  This is unchanged from previous exam.  The right kidney appears normal.  The left kidney is also normal. The urinary bladder appears within normal limits.  Calcifications within the prostate gland identified.  The seminal vesicles are unremarkable.  The abdominal aorta has a normal caliber.  No aneurysm.  Mild calcified atherosclerotic change noted.  No upper abdominal adenopathy.  There is no pelvic  or inguinal adenopathy identified.  The stomach is normal.  The small bowel loops are unremarkable. The  appendix is visualized and appears within normal limits. Normal appearance of the colon.  No free fluid or fluid collections identified within the abdomen or pelvis.  No free fluid or abnormal fluid collections within the abdomen or pelvis.  The visualized osseous structures are remarkable for mild lumbar spondylosis.  IMPRESSION:  1.  No acute findings within the abdomen or pelvis. 2.  Stable tiny hypervascular lesion in the right hepatic lobe. 3.  Stable left adrenal nodule.  This remains nonspecific and indeterminate.  Further characterization with MRI may be helpful. 4.  Increased caliber of the common bile duct appears similar to previous exam.  No obstructing stone or mass noted.   Original Report Authenticated By: Signa Kell, M.D.    Review of Systems  Constitutional: Positive for weight loss.  Neurological: Positive for weakness.  Psychiatric/Behavioral: Positive for depression, suicidal ideas and hallucinations. The patient is nervous/anxious and has insomnia.        Passive suicidal thinking with no plan   Blood pressure 107/59, pulse 109, temperature 98.1 F (36.7 C), temperature source Oral, resp. rate 20, height 6\' 1"  (1.854 m), weight 58.514 kg (129 lb), SpO2 96.00%. Physical Exam  Assessment/Plan: Axis I Acute grief reaction, depressive disorder NOS  Plan The patient currently denies any suicidal thoughts or plan.  He understands that he was in a shock when he received the news about his father.  He reported that he is going through grief and like to get some counseling.  I also talk about her starting low-dose antidepressant which he agreed.  Recommend low-dose Lexapro 5 mg if medically not contraindicated.  Patient does not need inpatient psychiatric treatment at this time .  Recommend outpatient services to help his grief and depression.  Social worker need to provide referrals prior to his discharge.    Tanish Sinkler T. 07/16/2013, 6:19 PM

## 2013-07-17 DIAGNOSIS — J690 Pneumonitis due to inhalation of food and vomit: Principal | ICD-10-CM

## 2013-07-17 DIAGNOSIS — E0590 Thyrotoxicosis, unspecified without thyrotoxic crisis or storm: Secondary | ICD-10-CM

## 2013-07-17 DIAGNOSIS — E43 Unspecified severe protein-calorie malnutrition: Secondary | ICD-10-CM | POA: Insufficient documentation

## 2013-07-17 DIAGNOSIS — R112 Nausea with vomiting, unspecified: Secondary | ICD-10-CM

## 2013-07-17 DIAGNOSIS — F329 Major depressive disorder, single episode, unspecified: Secondary | ICD-10-CM

## 2013-07-17 DIAGNOSIS — J189 Pneumonia, unspecified organism: Secondary | ICD-10-CM

## 2013-07-17 DIAGNOSIS — E049 Nontoxic goiter, unspecified: Secondary | ICD-10-CM

## 2013-07-17 DIAGNOSIS — R197 Diarrhea, unspecified: Secondary | ICD-10-CM

## 2013-07-17 DIAGNOSIS — D696 Thrombocytopenia, unspecified: Secondary | ICD-10-CM

## 2013-07-17 LAB — CBC
HCT: 27.1 % — ABNORMAL LOW (ref 39.0–52.0)
HCT: 26.5 % — ABNORMAL LOW (ref 39.0–52.0)
Hemoglobin: 8.7 g/dL — ABNORMAL LOW (ref 13.0–17.0)
MCH: 25.1 pg — ABNORMAL LOW (ref 26.0–34.0)
MCHC: 33.2 g/dL (ref 30.0–36.0)
MCV: 75.5 fL — ABNORMAL LOW (ref 78.0–100.0)
RDW: 14.6 % (ref 11.5–15.5)
WBC: 9 10*3/uL (ref 4.0–10.5)

## 2013-07-17 LAB — T4, FREE: Free T4: 4.3 ng/dL — ABNORMAL HIGH (ref 0.80–1.80)

## 2013-07-17 LAB — BASIC METABOLIC PANEL
Chloride: 110 mEq/L (ref 96–112)
GFR calc Af Amer: >90 mL/min (ref >90)
GFR calc non Af Amer: >90 mL/min (ref >90)
Potassium: 3.9 mEq/L (ref 3.5–5.1)

## 2013-07-17 LAB — MAGNESIUM: Magnesium: 1.4 mg/dL — ABNORMAL LOW (ref 1.5–2.5)

## 2013-07-17 LAB — LACTIC ACID, PLASMA: Lactic Acid, Venous: 2.3 mmol/L — ABNORMAL HIGH (ref 0.5–2.2)

## 2013-07-17 MED ORDER — METHIMAZOLE 10 MG PO TABS
10.0000 mg | ORAL_TABLET | Freq: Two times a day (BID) | ORAL | Status: DC
  Administered 2013-07-18: 10 mg via ORAL
  Filled 2013-07-17 (×2): qty 1

## 2013-07-17 MED ORDER — MAGNESIUM SULFATE 40 MG/ML IJ SOLN
2.0000 g | Freq: Once | INTRAMUSCULAR | Status: AC
  Administered 2013-07-17: 2 g via INTRAVENOUS
  Filled 2013-07-17: qty 50

## 2013-07-17 MED ORDER — ESCITALOPRAM OXALATE 5 MG PO TABS
5.0000 mg | ORAL_TABLET | Freq: Every day | ORAL | Status: DC
  Administered 2013-07-17: 5 mg via ORAL
  Filled 2013-07-17 (×2): qty 1

## 2013-07-17 MED ORDER — NAPHAZOLINE HCL 0.1 % OP SOLN
2.0000 [drp] | Freq: Four times a day (QID) | OPHTHALMIC | Status: DC | PRN
  Filled 2013-07-17: qty 15

## 2013-07-17 NOTE — Progress Notes (Signed)
[facility] PROGRESS NOTE  Phillip Kemp GNF:[id number] DOB: 1961/08/30 DOA: 07/15/2013 PCP: Milinda Antis, MD  Assessment/Plan: Aspiration pneumonia  -Patient will be continue on empiric Levaquin.  -follow blood cultures, sputum culture, urinary cx's  -Legionella antigen neg  -strep pneumonia antigen negative  -HIV test non reactive -Pulmonary toilet and supplemental oxygen if needed.  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  -due to dehydration  -continue IVF's -will repeat lactic acid in am  Elevated LFTs  -Monitor. 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   Microcytic anemia  -History of chronic blood loss anemia. Hemoglobin stable at 12.3; probably hemoconcentration as patient dehydrated -will follow Hgb trend -check hemoccults cards   Thrombocytopenia  -Likely secondary to acute illness.  -no signs of acute bleeding -continue SCD's and avoid heparin products  Thyromegaly  -Incidentally discovered.  -TSH is low and free T 4 is high; could be secondary to to sick hyperthyroidism and/or thyroiditis. -will start tx with tapazole as he had other features favoring hyperthyroidism (weight loss, severe diarrhea, exophthalmus); will arrange visit with endocrinologist at discharge  Adrenal nodule, left  -Appears stable on serial imaging. Consider outpatient MRI for further characterization.   Nausea, vomiting, diarrhea  -GI pathogen panel requested, still pending. Will continue PRN anti-emetics   -No evidence of recurrent pancreatitis. -lipase WNL -will continue Pepcid and carafate to help with  epigastric/esophageal discomfort (most likely gastroesophagitis)  Suicidal ideation  -Likely from an acute grief reaction from the death of his father.  -24 hour sitter discontinue per psych rec's -will arrange outpatient psychotherapy and counseling -started on lexapro 10mg  daily.  Hyperthyroidism -will start tapazole BID -will required outpatient follow up with endocrinologist; will arrange.  Protein calorie malnutrition (severe) -started on ensure -continue MV -encourage PO and follow nutrition rec's  DVT: SCD's     Code Status: Full Family Communication: no family at bedside Disposition Plan: to be determine; continue inpatient treatment   Consultants: Psych Nutritional service  Procedures:  See below for x-ray report  Antibiotics:  levaquin  HPI/Subjective: Feeling better, denies SI, but feels depressed; remains afebrile. Still with loose stools and complaining of left eye dryness and itching.  Objective: Filed Vitals:   07/16/13 1343 07/16/13 2124 07/17/13 0602 07/17/13 1300  BP: 107/59 109/52 104/46 106/42  Pulse: 109 95 80 87  Temp: 98.1 F (36.7 C) 98 F (36.7 C) 97.4 F (36.3 C) 98.4 F (36.9 C)  TempSrc: Oral Oral Oral Oral  Resp: 20 20 18 20   Height:      Weight:      SpO2: 96% 100% 100% 100%    Intake/Output Summary (Last 24 hours) at 07/17/13 1538 Last data filed at 07/17/13 1227  Gross per 24 hour  Intake   2820 ml  Output      0 ml  Net   2820 ml   Filed Weights   07/15/13 1700  Weight: 58.514 kg (129 lb)    Exam:   General:  NAD, afebrile; feeling better; left eye redness; some degree of exophthalmus appreciated.  Cardiovascular: S1 and s2, no rubs or gallops  Respiratory: diffuse rhonchi, no wheezing  Abdomen: soft, epigastric discomfort, no  distension; positive BS  Musculoskeletal: no edema, no erythema and no cyanosis  Data Reviewed: Basic Metabolic Panel:  Recent Labs Lab 07/15/13 1150 07/16/13 0803  07/17/13 0811  NA 131* 136 140  K 3.4* 3.2* 3.9  CL 95* 107 110  CO2 16* 20 20  GLUCOSE 113* 141* 108*  BUN 53* 38* 19  CREATININE 1.10 0.78 0.52  CALCIUM 9.7 9.8 9.3  MG  --   --  1.4*   Liver Function Tests:  Recent Labs Lab 07/15/13 1150  AST 57*  ALT 57*  ALKPHOS 207*  BILITOT 0.4  PROT 7.6  ALBUMIN 3.1*    Recent Labs Lab 07/15/13 1150  LIPASE 23   CBC:  Recent Labs Lab 07/15/13 1150 07/17/13 0605 07/17/13 0811  WBC 29.2* 7.4 9.0  HGB 12.3* QUESTIONABLE RESULTS, RECOMMEND RECOLLECT TO VERIFY 8.7*  HCT 36.1* 27.1* 26.5*  MCV 74.4* 75.5* 76.1*  PLT 144* 152 153   Studies: No results found.  Scheduled Meds: . escitalopram  5 mg Oral QHS  . famotidine  40 mg Oral Daily  . feeding supplement  237 mL Oral TID BM  . levofloxacin (LEVAQUIN) IV  750 mg Intravenous Q24H  . magnesium sulfate 1 - 4 g bolus IVPB  2 g Intravenous Once  . multivitamin with minerals  1 tablet Oral Daily  . sucralfate  1 g Oral TID WC & HS   Continuous Infusions: . 0.9 % NaCl with KCl 40 mEq / L 75 mL/hr at 07/17/13 1520    Principal Problem:   Aspiration pneumonia Active Problems:   Hyponatremia   Hypokalemia   Metabolic acidosis   Elevated LFTs   Microcytic anemia   Thrombocytopenia   Thyromegaly   Adrenal nodule, left    Time spent: >30 minutes   Dreon Pineda  [facility] Pager [phone]. If 7PM-7AM, please contact night-coverage at [email or web address], password [email or web address] 07/17/2013, 3:38 PM  LOS: 2 days

## 2013-07-17 NOTE — Progress Notes (Signed)
INITIAL NUTRITION ASSESSMENT  Patient meets the criteria for severe MALNUTRITION in the context of chronic illness with 8% weight loss in 8 months, PO intake <75% of estimated needs, and severe muscle and fat tissue loss.   DOCUMENTATION CODES Per approved criteria  -Severe malnutrition in the context of chronic illness -Underweight   INTERVENTION: 1. Continue Ensure Complete TID 2. Patient encouraged to order foods per preference from menu for adequate intake.   NUTRITION DIAGNOSIS: Underweight related to history of poor intake as evidenced by BMI 17.1.   Goal: Patient will meet >/=90% of estimated nutrition needs  Monitor:  PO intake, weight, labs  Reason for Assessment: Consult  52 y.o. male  Admitting Dx: Aspiration pneumonia  ASSESSMENT: Patient with a history of substance abuse, alcoholic pancreatitis, and gastritis. He complains of nausea, vomiting, and diarrhea for the last 5 days with minimal oral intake. His appetite was fair prior to that. However, he is currently eating 100% of his meals and Ensure Complete with excellent appetite. His weight is down 8% over 8 months with severe muscle loss at the clavicle, shoulder, and lower body, and severe fat loss at the triceps.   Height: Ht Readings from Last 1 Encounters:  07/15/13 6\' 1"  (1.854 m)    Weight: Wt Readings from Last 1 Encounters:  07/15/13 129 lb (58.514 kg)    Ideal Body Weight: 172 pounds  % Ideal Body Weight: 75%  Wt Readings from Last 10 Encounters:  07/15/13 129 lb (58.514 kg)  12/05/12 131 lb (59.421 kg)  11/15/12 140 lb (63.504 kg)  10/20/12 141 lb (63.957 kg)  09/24/12 131 lb 14.4 oz (59.829 kg)  09/11/12 138 lb (62.596 kg)  09/09/12 141 lb 5 oz (64.1 kg)  07/11/12 157 lb (71.215 kg)  08/27/11 167 lb 1.9 oz (75.805 kg)  08/25/11 162 lb (73.483 kg)    Usual Body Weight: 140 pounds  % Usual Body Weight: 92%  BMI:  Body mass index is 17.02 kg/(m^2).  Estimated Nutritional  Needs: Kcal: 1950-2100 kcal Protein: 80-90 g Fluid: >2.0 L/day  Skin: Intact  Diet Order: General  EDUCATION NEEDS: -No education needs identified at this time   Intake/Output Summary (Last 24 hours) at 07/17/13 1538 Last data filed at 07/17/13 1227  Gross per 24 hour  Intake   2820 ml  Output      0 ml  Net   2820 ml    Last BM: 8/2   Labs:   Recent Labs Lab 07/15/13 1150 07/16/13 0803 07/17/13 0811  NA 131* 136 140  K 3.4* 3.2* 3.9  CL 95* 107 110  CO2 16* 20 20  BUN 53* 38* 19  CREATININE 1.10 0.78 0.52  CALCIUM 9.7 9.8 9.3  MG  --   --  1.4*  GLUCOSE 113* 141* 108*    CBG (last 3)  No results found for this basename: GLUCAP,  in the last 72 hours  Scheduled Meds: . escitalopram  5 mg Oral QHS  . famotidine  40 mg Oral Daily  . feeding supplement  237 mL Oral TID BM  . levofloxacin (LEVAQUIN) IV  750 mg Intravenous Q24H  . magnesium sulfate 1 - 4 g bolus IVPB  2 g Intravenous Once  . multivitamin with minerals  1 tablet Oral Daily  . sucralfate  1 g Oral TID WC & HS    Continuous Infusions: . 0.9 % NaCl with KCl 40 mEq / L 75 mL/hr at 07/17/13 1520  Past Medical History  Diagnosis Date  . Arthritis   . Substance abuse     Cocaine, marijuana, and alcohol  . Suicidal ideation 09/09/2012  . Chronic blood loss anemia 09/09/2012  . Rectal bleeding 09/08/2012  . Acute alcoholic pancreatitis 09/08/2012    Past Surgical History  Procedure Laterality Date  . Orthopedic surgery      Left foot surgery  . Left foot surgery    . Left cataract extraction      Linnell Fulling, RD, LDN Pager #: [phone] After-Hours Pager #: [phone]

## 2013-07-18 ENCOUNTER — Encounter: Payer: Self-pay | Admitting: Gastroenterology

## 2013-07-18 LAB — GI PATHOGEN PANEL BY PCR, STOOL
C difficile toxin A/B: NEGATIVE
Campylobacter by PCR: NEGATIVE
Cryptosporidium by PCR: NEGATIVE
E coli (ETEC) LT/ST: NEGATIVE
E coli (STEC): NEGATIVE
E coli 0157 by PCR: NEGATIVE
Norovirus GI/GII: NEGATIVE

## 2013-07-18 LAB — CBC
MCH: 25.1 pg — ABNORMAL LOW (ref 26.0–34.0)
MCHC: 32.7 g/dL (ref 30.0–36.0)
MCV: 76.6 fL — ABNORMAL LOW (ref 78.0–100.0)
Platelets: 170 10*3/uL (ref 150–400)

## 2013-07-18 LAB — BASIC METABOLIC PANEL
BUN: 13 mg/dL (ref 6–23)
Calcium: 9.5 mg/dL (ref 8.4–10.5)
Creatinine, Ser: 0.51 mg/dL (ref 0.50–1.35)
GFR calc Af Amer: >90 mL/min (ref >90)

## 2013-07-18 LAB — MAGNESIUM: Magnesium: 1.4 mg/dL — ABNORMAL LOW (ref 1.5–2.5)

## 2013-07-18 MED ORDER — ESCITALOPRAM OXALATE 5 MG PO TABS: 10.0000 mg | ORAL_TABLET | Freq: Every day | ORAL | Status: DC

## 2013-07-18 MED ORDER — LEVOFLOXACIN 750 MG PO TABS: 750.0000 mg | ORAL_TABLET | Freq: Every day | ORAL | Status: AC

## 2013-07-18 MED ORDER — FAMOTIDINE 40 MG PO TABS: 40.0000 mg | ORAL_TABLET | Freq: Every day | ORAL | Status: DC

## 2013-07-18 MED ORDER — NAPHAZOLINE HCL 0.1 % OP SOLN: 2.0000 [drp] | Freq: Four times a day (QID) | OPHTHALMIC | Status: DC | PRN

## 2013-07-18 MED ORDER — SUCRALFATE 1 GM/10ML PO SUSP: 1.0000 g | Freq: Three times a day (TID) | ORAL | Status: DC

## 2013-07-18 MED ORDER — MAGNESIUM OXIDE 400 MG PO TABS: 400.0000 mg | ORAL_TABLET | Freq: Every day | ORAL | Status: DC

## 2013-07-18 MED ORDER — METHIMAZOLE 10 MG PO TABS: 10.0000 mg | ORAL_TABLET | Freq: Two times a day (BID) | ORAL | Status: DC

## 2013-07-18 MED ORDER — ENSURE COMPLETE PO LIQD: 237.0000 mL | Freq: Three times a day (TID) | ORAL | Status: DC

## 2013-07-18 NOTE — Progress Notes (Signed)
Patient discharged home in stable condition.  No change from morning assessment.  Discharge instructions and scripts given to pt with verbal understanding.

## 2013-07-18 NOTE — Discharge Summary (Signed)
Physician Discharge Summary  Phillip Kemp ZOX:[id number] DOB: [birth year] DOA: 07/15/2013  PCP: Milinda Antis, MD  Admit date: 07/15/2013 Discharge date: 07/18/2013  Time spent: >30 minutes  Recommendations for Outpatient Follow-up:  Abd MRI to follow adrenal nodule CMET to follow LFT's, electrolytes and kidney function CBC to follow Hgb and platelets trend   Discharge Diagnoses:  Principal Problem:   Aspiration pneumonia Active Problems:   Hyponatremia   Hypokalemia   Metabolic acidosis   Elevated LFTs   Microcytic anemia   Thrombocytopenia   Thyromegaly   Adrenal nodule, left   Protein-calorie malnutrition, severe   Discharge Condition: stable and improved. Discharge home with instruction to finish antibiotics by mouth. Will follow at [facility] while he establishes care with PCP. Appointment with GI and endocrinologist also arranged. Information and resources for grief counseling provided and also received appointment for outpatient psych.  Diet recommendation: regular diet  Filed Weights   07/15/13 1700  Weight: 58.514 kg (129 lb)    History of present illness:  52 y.o. male with a PMH of polysubstance abuse (clean and sober for 5 months), alcoholic pancreatitis, gastritis who presents with a 5 day history of nausea, vomiting, diarrhea, cough. The patient thinks he may have choked on his vomit. He has had a cough productive of yellow sputum, intermittent subjective fever and chills and dyspnea on exertion, dysphagia with swollen neck glands and racing heartbeat. He has taken Alka-Seltzer plus and tylenol with no significant relief. Has not been able to keep anything down for 5 days, he last vomited last night. Has not tried any oral intake since. He is also having frequent loose stools with some blood tinged stools. The patient tells me he has never used injectable drugs and has been HIV tested in the past and has been negative. The patient  also expressed some suicidal ideation to the emergency department physician (currently denies) and has been depressed secondary to losing his father this week.   Hospital Course:  Aspiration pneumonia  -Patient will be discharge on Levaquin to finish antibiotic treatment.  -blood cultures, sputum culture and urinary cx's negative -Legionella antigen neg  -strep pneumonia antigen negative  -HIV test non reactive  -Pulmonary toilet and supplemental oxygen provided during hospitalization. 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  Metabolic acidosis  -with elevated lactic acid and normal ketones.  -due to dehydration and diarrhea  -resolved with IVF's  -at discharge lactic acid WNL  Hx of alcohol abuse and Elevated LFTs  -Monitor. Likely secondary to acute illness and hx of alcohol  -patient had about 4 months clean (congratulated and encourage to continue keeping himself sober)  -CT scan of the abdomen show stable mild common bile duct dilatation.   Microcytic anemia  -History of chronic blood loss anemia. Hemoglobin stable at 12.3; probably hemoconcentration as patient dehydrated  -At discharge Hgb back to baseline around 8.9-9.0 (as previously seen on his records -hemoccults cards without blood -as mentioned below will arranged visit with GI for colonoscopy as an outpatient (patient in need of screening anyway given age).  Thrombocytopenia  -Likely secondary to acute illness.  -no signs of acute bleeding  -heparin products avoided during admission -at discharge platelets WNL  Thyromegaly  -Incidentally discovered.  -TSH is low and free T 4 is high; could be secondary to to sick hyperthyroidism  and/or thyroiditis.  -will start tx with tapazole as he had other features favoring hyperthyroidism (weight loss, severe diarrhea, exophthalmus);   -visit with endocrinologist at discharge has been arranged   Adrenal nodule, left  -Appears stable on serial imaging. Consider outpatient MRI for further characterization.   Nausea, vomiting, diarrhea  -GI pathogen panel requested and negative. Patient w/o any further nausea or vomiting. -No evidence of recurrent pancreatitis; lipase WNL  -will continue Pepcid and carafate to help with epigastric/esophageal discomfort (most likely gastroesophagitis) -Patient with intermittent rectal bleeding; none during this admission. Appointment with Dr. Anselm Jungling ([facility]) for outpatient colonoscopy has been set up.    Suicidal ideation and depression  -Likely from an acute grief reaction from the death of his father.  -24 hour sitter discontinue per psych rec's after 48 hours evaluation w/o SI -will arrange outpatient psychotherapy and counseling  -started on lexapro 10mg  daily.   Hyperthyroidism  -will start tapazole BID  -will required outpatient follow up with endocrinologist; appointment arranged to see Dr. Elvera Lennox.   Protein calorie malnutrition (severe)  -started on ensure  -continue MV  -encourage PO intake   Procedures: See below for x-ray reports  Consultations:  GI and endocrinology ([facility]; Dr. Russella Dar and Dr. Elvera Lennox respectively) curbside with case to arrange outpatient follow up.  Discharge Exam: Filed Vitals:   07/17/13 0602 07/17/13 1300 07/17/13 2200 07/18/13 0603  BP: 104/46 106/42 118/47 123/57  Pulse: 80 87 97 82  Temp: 97.4 F (36.3 C) 98.4 F (36.9 C) 98.6 F (37 C) 98.3 F (36.8 C)  TempSrc: Oral Oral Oral Oral  Resp: 18 20 20 20   Height:      Weight:      SpO2: 100% 100% 99% 98%   General: NAD, afebrile; feeling better; left eye redness almost completely resolved; some degree of exophthalmus appreciated.  Cardiovascular: S1 and s2, no rubs or gallops  Respiratory: scattered rhonchi, no wheezing; improved air moevement Abdomen: soft, epigastric  discomfort, no distension; positive BS  Musculoskeletal: no edema, no erythema and no cyanosis    Discharge Instructions  Discharge Orders   Future Appointments Provider Department Dept Phone   08/05/2013 2:30 PM Meryl Dare, MD [facility] [phone]   08/18/2013 8:30 AM Carlus Pavlov, MD [facility] [phone]   Future Orders Complete By Expires     Discharge instructions  As directed     Comments:      Follow at Adult center in 10 days Follow with endocrinology service as schedule  Take medications as prescribed Keep yourself well hydrated Follow/arrange outpatient visit for further grief counseling        Medication List         escitalopram 5 MG tablet  Commonly known as:  LEXAPRO  Take 2 tablets (10 mg total) by mouth at bedtime.     famotidine 40 MG tablet  Commonly known as:  PEPCID  Take 1 tablet (40 mg total) by mouth daily.     feeding supplement Liqd  Take 237 mLs by mouth 3 (three) times daily between meals.     GERITOL COMPLETE Tabs  Take 1 tablet by mouth daily.     levofloxacin 750 MG tablet  Commonly known as:  LEVAQUIN  Take 1 tablet (750 mg total) by mouth daily.     magnesium oxide 400 MG tablet  Commonly known as:  MAG-OX  Take 1 tablet (400 mg total) by mouth daily.     methimazole 10  MG tablet  Commonly known as:  TAPAZOLE  Take 1 tablet (10 mg total) by mouth 2 (two) times daily.     naphazoline 0.1 % ophthalmic solution  Commonly known as:  NAPHCON  Place 2 drops into the left eye 4 (four) times daily as needed.     sucralfate 1 GM/10ML suspension  Commonly known as:  CARAFATE  Take 10 mLs (1 g total) by mouth 4 (four) times daily -  with meals and at bedtime.       No Known Allergies     Follow-up Information   Call [facility]. (For crisis response services 24 hours a day, 7 days a week, 365 days a year)    Contact information:   [phone]      Call [facility]. (To schedule an appointment for grief counseling)    Contact information:   [address] [place], [place]  [phone]      Call [facility]. (To schedule an appointment for individual counseling)    Contact information:   [address] [place], [place] [address] [phone]      Follow up with Judie Petit T. Russella Dar, MD On 08/05/2013. (at 2:30pm)    Contact information:   [address] [place] [address] [phone]       Follow up with Carlus Pavlov, MD On 08/18/2013. (at 8:15)    Contact information:   [address] [place] [address] [phone]       Follow up with  [facility]. Schedule an appointment as soon as possible for a visit in 10 days.   Contact information:   [address] [place] [address] [phone]       The results of significant diagnostics from this hospitalization (including imaging, microbiology, ancillary and laboratory) are listed below for reference.    Significant Diagnostic Studies: Dg Chest 2 View  07/15/2013   *RADIOLOGY REPORT*  Clinical Data: Chest pain, cough, ex-smoker  CHEST - 2 VIEW  Comparison: 10/07/2012  Findings:  Cardiomediastinal silhouette is stable.  There is mass- like consolidation in the left hilum/perihilar region with some air bronchogram measures at least 7.7 cm.  Although this may be due to focal pneumonia lung mass cannot be excluded.  Further evaluation with CT scan of the chest with IV contrast is recommended.  Right lung is clear.  No pulmonary edema.  IMPRESSION:  There is mass-like consolidation in the left hilum/perihilar region with some air bronchogram measures at least 7.7 cm. Although this may be due to focal pneumonia lung mass cannot be excluded.  Further evaluation with CT scan of the chest with IV contrast is recommended.  Right lung is clear.  No pulmonary edema.   Original Report Authenticated By: Natasha Mead, M.D.   Ct Chest W Contrast  07/15/2013   *RADIOLOGY REPORT*  Clinical Data:  Chest congestion  CT CHEST, ABDOMEN AND PELVIS WITH CONTRAST  Technique:  Multidetector CT imaging of the chest, abdomen and pelvis was performed following the standard protocol during bolus administration of intravenous contrast.  Contrast: OMNIPAQUE IOHEXOL 300 MG/ML  SOLN  Comparison:  09/24/2012  CT CHEST  Findings:  No pleural effusion identified.  There is a rounded opacity within the superior segment of the left lower lobe measuring 4.7 x 3.9 cm, image 36/series six.  The right lung appears clear.  The heart size appears within normal limits. There is no  pericardial effusion.  There is an AP window lymph node which is enlarged measuring 1.3 cm, image 25/series 2.  This is compared with 1 cm previously.  Prominent axillary lymph nodes are again noted.  Within the left axilla there is a 1.1 cm lymph node, image 13/series 2.  This is unchanged from previous exam.  Within the right axilla there is a stable 8 mm lymph node, image 13/series 2.  There is marked, uniform enlargement of the entire thyroid gland.  Review of the visualized osseous structures is significant for mild spondylosis within the thoracic spine.  No aggressive lytic or sclerotic bone lesions.  IMPRESSION:  1.  Rounded airspace consolidation within the superior segment of the left lower lobe is identified.  In the acute setting this likely represents a pneumonia.  Recommend follow-up imaging after appropriate antibiotic therapy.  If this persists after appropriate therapy then further assessment with tissue sampling and/or PET CT would be advised.  If there are no symptoms of pneumonia then consider proceeding with tissue sampling and/or PET CT. 2.  Enlarged AP window lymph node is nonspecific.  In the setting of infection this may be reactive in etiology.  Metastatic adenopathy is not excluded. 3.  Diffuse enlargement of the thyroid gland.  Advise further  evaluation with TSH.  CT ABDOMEN AND PELVIS  Findings:  Stable 9mm enhancing structure within the lateral aspect of the right hepatic lobe, image 66/series 2.  The gallbladder appears normal.  The increased caliber of the common bile duct which measures 1.2 cm, image 28/series 3.  This is unchanged from previous exam.  No obstructing stone or mass noted. The pancreatic duct appears normal.  The pancreas is negative. Normal appearance of the spleen.  The right adrenal gland is unremarkable.  There is a nodule in the left adrenal gland measuring 2.8 x 2.2 cm, image 61/series 2.  This is unchanged from previous exam.  The right kidney appears normal.  The left kidney is also normal. The urinary bladder appears within normal limits.  Calcifications within the prostate gland identified.  The seminal vesicles are unremarkable.  The abdominal aorta has a normal caliber.  No aneurysm.  Mild calcified atherosclerotic change noted.  No upper abdominal adenopathy.  There is no pelvic or inguinal adenopathy identified.  The stomach is normal.  The small bowel loops are unremarkable. The appendix is visualized and appears within normal limits. Normal appearance of the colon.  No free fluid or fluid collections identified within the abdomen or pelvis.  No free fluid or abnormal fluid collections within the abdomen or pelvis.  The visualized osseous structures are remarkable for mild lumbar spondylosis.  IMPRESSION:  1.  No acute findings within the abdomen or pelvis. 2.  Stable tiny hypervascular lesion in the right hepatic lobe. 3.  Stable left adrenal nodule.  This remains nonspecific and indeterminate.  Further characterization with MRI may be helpful. 4.  Increased caliber of the common bile duct appears similar to previous exam.  No obstructing stone or mass noted.   Original Report Authenticated By: Signa Kell, M.D.   Ct Abdomen Pelvis W Contrast  07/15/2013   *RADIOLOGY REPORT*  Clinical Data:  Chest congestion  CT  CHEST, ABDOMEN AND PELVIS WITH CONTRAST  Technique:  Multidetector CT imaging of the chest, abdomen and pelvis was performed following the standard protocol during bolus administration of intravenous contrast.  Contrast: OMNIPAQUE IOHEXOL 300 MG/ML  SOLN  Comparison:  09/24/2012  CT CHEST  Findings:  No pleural effusion identified.  There is a rounded opacity within the superior segment of the left lower lobe measuring 4.7 x 3.9 cm, image 36/series six.  The right lung appears clear.  The heart size appears within normal limits. There is no pericardial effusion.  There is an AP window lymph node which is enlarged measuring 1.3 cm, image 25/series 2.  This is compared with 1 cm previously.  Prominent axillary lymph nodes are again noted.  Within the left axilla there is a 1.1 cm lymph node, image 13/series 2.  This is unchanged from previous exam.  Within the right axilla there is a stable 8 mm lymph node, image 13/series 2.  There is marked, uniform enlargement of the entire thyroid gland.  Review of the visualized osseous structures is significant for mild spondylosis within the thoracic spine.  No aggressive lytic or sclerotic bone lesions.  IMPRESSION:  1.  Rounded airspace consolidation within the superior segment of the left lower lobe is identified.  In the acute setting this likely represents a pneumonia.  Recommend follow-up imaging after appropriate antibiotic therapy.  If this persists after appropriate therapy then further assessment with tissue sampling and/or PET CT would be advised.  If there are no symptoms of pneumonia then consider proceeding with tissue sampling and/or PET CT. 2.  Enlarged AP window lymph node is nonspecific.  In the setting of infection this may be reactive in etiology.  Metastatic adenopathy is not excluded. 3.  Diffuse enlargement of the thyroid gland.  Advise further evaluation with TSH.  CT ABDOMEN AND PELVIS  Findings:  Stable 9mm enhancing structure within the lateral  aspect of the right hepatic lobe, image 66/series 2.  The gallbladder appears normal.  The increased caliber of the common bile duct which measures 1.2 cm, image 28/series 3.  This is unchanged from previous exam.  No obstructing stone or mass noted. The pancreatic duct appears normal.  The pancreas is negative. Normal appearance of the spleen.  The right adrenal gland is unremarkable.  There is a nodule in the left adrenal gland measuring 2.8 x 2.2 cm, image 61/series 2.  This is unchanged from previous exam.  The right kidney appears normal.  The left kidney is also normal. The urinary bladder appears within normal limits.  Calcifications within the prostate gland identified.  The seminal vesicles are unremarkable.  The abdominal aorta has a normal caliber.  No aneurysm.  Mild calcified atherosclerotic change noted.  No upper abdominal adenopathy.  There is no pelvic or inguinal adenopathy identified.  The stomach is normal.  The small bowel loops are unremarkable. The appendix is visualized and appears within normal limits. Normal appearance of the colon.  No free fluid or fluid collections identified within the abdomen or pelvis.  No free fluid or abnormal fluid collections within the abdomen or pelvis.  The visualized osseous structures are remarkable for mild lumbar spondylosis.  IMPRESSION:  1.  No acute findings within the abdomen or pelvis. 2.  Stable tiny hypervascular lesion in the right hepatic lobe. 3.  Stable left adrenal nodule.  This remains nonspecific and indeterminate.  Further characterization with MRI may be helpful. 4.  Increased caliber of the common bile duct appears similar to previous exam.  No obstructing stone or mass noted.   Original Report Authenticated By: Signa Kell, M.D.    Microbiology: Recent Results (from the past 240 hour(s))  URINE CULTURE     Status: None   Collection Time  07/15/13 11:56 AM      Result Value Range Status   Specimen Description URINE, CLEAN CATCH    Final   Special Requests NONE   Final   Culture  Setup Time 07/15/2013 19:49   Final   Colony Count 45,000 COLONIES/ML   Final   Culture     Final   Value: Multiple bacterial morphotypes present, none predominant. Suggest appropriate recollection if clinically indicated.   Report Status 07/16/2013 FINAL   Final  CULTURE, BLOOD (ROUTINE X 2)     Status: None   Collection Time    07/15/13  5:55 PM      Result Value Range Status   Specimen Description BLOOD LEFT ARM   Final   Special Requests BOTTLES DRAWN AEROBIC ONLY 10CC   Final   Culture  Setup Time 07/15/2013 22:37   Final   Culture     Final   Value:        BLOOD CULTURE RECEIVED NO GROWTH TO DATE CULTURE WILL BE HELD FOR 5 DAYS BEFORE ISSUING A FINAL NEGATIVE REPORT   Report Status PENDING   Incomplete  CULTURE, BLOOD (ROUTINE X 2)     Status: None   Collection Time    07/15/13  6:10 PM      Result Value Range Status   Specimen Description BLOOD LEFT ARM   Final   Special Requests BOTTLES DRAWN AEROBIC AND ANAEROBIC 10CC   Final   Culture  Setup Time 07/15/2013 22:37   Final   Culture     Final   Value:        BLOOD CULTURE RECEIVED NO GROWTH TO DATE CULTURE WILL BE HELD FOR 5 DAYS BEFORE ISSUING A FINAL NEGATIVE REPORT   Report Status PENDING   Incomplete  CULTURE, EXPECTORATED SPUTUM-ASSESSMENT     Status: None   Collection Time    07/16/13  8:13 PM      Result Value Range Status   Specimen Description SPUTUM   Final   Special Requests Normal   Final   Sputum evaluation     Final   Value: THIS SPECIMEN IS ACCEPTABLE. RESPIRATORY CULTURE REPORT TO FOLLOW.   Report Status 07/16/2013 FINAL   Final  CULTURE, RESPIRATORY (NON-EXPECTORATED)     Status: None   Collection Time    07/16/13  8:13 PM      Result Value Range Status   Specimen Description SPU   Final   Special Requests NONE   Final   Gram Stain PENDING   Incomplete   Culture FEW CANDIDA ALBICANS   Final   Report Status PENDING   Incomplete     Labs: Basic  Metabolic Panel:  Recent Labs Lab 07/15/13 1150 07/16/13 0803 07/17/13 0811 07/18/13 0502  NA 131* 136 140 141  K 3.4* 3.2* 3.9 4.6  CL 95* 107 110 110  CO2 16* 20 20 23   GLUCOSE 113* 141* 108* 96  BUN 53* 38* 19 13  CREATININE 1.10 0.78 0.52 0.51  CALCIUM 9.7 9.8 9.3 9.5  MG  --   --  1.4* 1.4*   Liver Function Tests:  Recent Labs Lab 07/15/13 1150  AST 57*  ALT 57*  ALKPHOS 207*  BILITOT 0.4  PROT 7.6  ALBUMIN 3.1*    Recent Labs Lab 07/15/13 1150  LIPASE 23   CBC:  Recent Labs Lab 07/15/13 1150 07/17/13 0605 07/17/13 0811 07/18/13 0502  WBC 29.2* 7.4 9.0 11.2*  HGB 12.3* QUESTIONABLE RESULTS, RECOMMEND RECOLLECT  TO VERIFY 8.7* 8.9*  HCT 36.1* 27.1* 26.5* 27.2*  MCV 74.4* 75.5* 76.1* 76.6*  PLT 144* 152 153 170    Signed:  Deklan Minar  [facility] 07/18/2013, 1:31 PM

## 2013-07-18 NOTE — Progress Notes (Signed)
Clinical Social Work Department CLINICAL SOCIAL WORK PSYCHIATRY SERVICE LINE ASSESSMENT 07/18/2013  Patient:  Phillip Kemp  Account:  [id number]  Admit Date:  07/15/2013  Clinical Social Worker:  Unk Lightning, LCSW  Date/Time:  07/18/2013 10:30 AM Referred by:  Physician  Date referred:  07/18/2013 Reason for Referral  Psychosocial assessment   Presenting Symptoms/Problems (In the person's/family's own words):   Psych consulted due to SI and depression.   Abuse/Neglect/Trauma History (check all that apply)  Denies history   Abuse/Neglect/Trauma Comments:   Psychiatric History (check all that apply)  Denies history   Psychiatric medications:  Lexapro 5 mg   Current Mental Health Hospitalizations/Previous Mental Health History:   Patient reports he has felt depressed in the past especially after losing family members but has never received any formal treatment for depression.   Current provider:   None   Place and Date:   N/A   Current Medications:   HYDROcodone-acetaminophen, naphazoline, ondansetron (ZOFRAN) IV, traZODone            . escitalopram  5 mg Oral QHS  . famotidine  40 mg Oral Daily  . feeding supplement  237 mL Oral TID BM  . levofloxacin (LEVAQUIN) IV  750 mg Intravenous Q24H  . methimazole  10 mg Oral BID  . multivitamin with minerals  1 tablet Oral Daily  . sucralfate  1 g Oral TID WC & HS   Previous Impatient Admission/Date/Reason:   None reported   Emotional Health / Current Symptoms    Suicide/Self Harm  None reported   Suicide attempt in the past:   Patient denies any previous suicide attempts. Patient reports that he has had passive SI in the past but never acted on these feelings. Patient reports no current SI or HI.   Other harmful behavior:   None reported   Psychotic/Dissociative Symptoms  None reported   Other Psychotic/Dissociative Symptoms:   Patient denies any psychotic symptoms.    Attention/Behavioral Symptoms  Within  Normal Limits   Other Attention / Behavioral Symptoms:   Patient engaged and appropriate throughout assessment.    Cognitive Impairment  Orientation - Place  Orientation - Self  Orientation - Situation  Orientation - Time   Other Cognitive Impairment:   N/A    Mood and Adjustment  Flat    Stress, Anxiety, Trauma, Any Recent Loss/Stressor  Grief/Loss (recent or history)   Anxiety (frequency):   N/A   Phobia (specify):   N/A   Compulsive behavior (specify):   N/A   Obsessive behavior (specify):   N/A   Other:   Patient's father recently passed away due to cancer. Patient was unable to attend father's funeral.   Substance Abuse/Use  In recovery   SBIRT completed (please refer for detailed history):  N  Self-reported substance use:   Patient reports he has been sober from crack cocaine and alcohol for 5 months. Patient is living in a recovery house and attending AA meetings at least twice a day.   Urinary Drug Screen Completed:  Y Alcohol level:   Negative screen    Environmental/Housing/Living Arrangement  Assisted Living / Group Home   Who is in the home:   [facility]   Emergency contact:  [name]   Financial  Medicare   Patient's Strengths and Goals (patient's own words):   Patient enjoys living at the recovery house and reports that he and son have a good relationship.   Clinical Social Worker's Interpretive Summary:   CSW  received referral to complete psychosocial assessment. CSW reviewed chart and met with patient at bedside. CSW introduced myself and explained role.    Patient reports he was admitted due to pneumonia. Patient reports he is glad that he was admitted because he was having a difficult week and was concerned about relapsing. Patient reports that he moved from [place] to [place] in order to be closer to his son. Patient is currently living in a recovery house and reports that he has been sober for 5 months. Patient's father recently  passed away in [place] and patient was unable to attend the funeral. Patient reports that due to stress he was having urges to use substances again.    Patient reports that he has been feeling depressed lately and contributes depression to feeling like he is stuck in a routine with his recovery and recent deaths. Patient is proud that he is sober but reports he feels he needs something else to occupy his mind rather than just attending AA meetings. Patient spoke about possibly attending school or finding a job.    CSW and patient spoke about past treatment and his needs at DC. Patient reports he has had passive SI in the past. CSW encouraged patient to follow up with a therapist and provided information for Mobile Crisis. Patient does not have a plan and contracts for safety at this time. Patient agreeable to individual counseling and possibly interested in group sessions at [facility] and [facility] to deal with father's death.    CSW provided patient with referrals and placed information on AVS. CSW will continue to follow to provide support during hospitalization.   Disposition:  Outpatient referral made/needed

## 2013-07-19 LAB — CULTURE, RESPIRATORY W GRAM STAIN

## 2013-07-21 LAB — CULTURE, BLOOD (ROUTINE X 2): Culture: NO GROWTH

## 2013-08-05 ENCOUNTER — Ambulatory Visit: Payer: Self-pay | Admitting: Gastroenterology

## 2013-08-18 ENCOUNTER — Ambulatory Visit: Payer: Self-pay | Admitting: Internal Medicine

## 2013-08-18 DIAGNOSIS — Z0289 Encounter for other administrative examinations: Secondary | ICD-10-CM

## 2014-08-19 ENCOUNTER — Emergency Department (HOSPITAL_COMMUNITY): Payer: Medicare Other

## 2014-08-19 ENCOUNTER — Encounter (HOSPITAL_COMMUNITY): Payer: Self-pay | Admitting: Emergency Medicine

## 2014-08-19 ENCOUNTER — Emergency Department (HOSPITAL_COMMUNITY)
Admission: EM | Admit: 2014-08-19 | Discharge: 2014-08-19 | Disposition: A | Discharge status: Home / Self Care | Payer: Medicare Other | Attending: Emergency Medicine | Admitting: Emergency Medicine

## 2014-08-19 DIAGNOSIS — F1210 Cannabis abuse, uncomplicated: Secondary | ICD-10-CM | POA: Insufficient documentation

## 2014-08-19 DIAGNOSIS — M25473 Effusion, unspecified ankle: Secondary | ICD-10-CM | POA: Diagnosis not present

## 2014-08-19 DIAGNOSIS — E049 Nontoxic goiter, unspecified: Secondary | ICD-10-CM | POA: Insufficient documentation

## 2014-08-19 DIAGNOSIS — Z008 Encounter for other general examination: Secondary | ICD-10-CM | POA: Diagnosis present

## 2014-08-19 DIAGNOSIS — Z8719 Personal history of other diseases of the digestive system: Secondary | ICD-10-CM | POA: Diagnosis not present

## 2014-08-19 DIAGNOSIS — Z8739 Personal history of other diseases of the musculoskeletal system and connective tissue: Secondary | ICD-10-CM | POA: Insufficient documentation

## 2014-08-19 DIAGNOSIS — Z862 Personal history of diseases of the blood and blood-forming organs and certain disorders involving the immune mechanism: Secondary | ICD-10-CM | POA: Diagnosis not present

## 2014-08-19 DIAGNOSIS — F32A Depression, unspecified: Secondary | ICD-10-CM

## 2014-08-19 DIAGNOSIS — H0520 Unspecified exophthalmos: Secondary | ICD-10-CM | POA: Diagnosis not present

## 2014-08-19 DIAGNOSIS — F3289 Other specified depressive episodes: Secondary | ICD-10-CM | POA: Insufficient documentation

## 2014-08-19 DIAGNOSIS — F1910 Other psychoactive substance abuse, uncomplicated: Secondary | ICD-10-CM

## 2014-08-19 DIAGNOSIS — Z87891 Personal history of nicotine dependence: Secondary | ICD-10-CM | POA: Insufficient documentation

## 2014-08-19 DIAGNOSIS — F329 Major depressive disorder, single episode, unspecified: Secondary | ICD-10-CM | POA: Diagnosis not present

## 2014-08-19 DIAGNOSIS — M25476 Effusion, unspecified foot: Secondary | ICD-10-CM | POA: Diagnosis not present

## 2014-08-19 DIAGNOSIS — F1410 Cocaine abuse, uncomplicated: Secondary | ICD-10-CM | POA: Insufficient documentation

## 2014-08-19 LAB — RAPID URINE DRUG SCREEN, HOSP PERFORMED
AMPHETAMINES: NOT DETECTED
BARBITURATES: NOT DETECTED
BENZODIAZEPINES: NOT DETECTED
Cocaine: POSITIVE — AB
Opiates: NOT DETECTED
Tetrahydrocannabinol: POSITIVE — AB

## 2014-08-19 LAB — COMPREHENSIVE METABOLIC PANEL
ALT: 25 U/L (ref 0–53)
AST: 24 U/L (ref 0–37)
Albumin: 3 g/dL — ABNORMAL LOW (ref 3.5–5.2)
Alkaline Phosphatase: 231 U/L — ABNORMAL HIGH (ref 39–117)
Anion gap: 12 (ref 5–15)
BILIRUBIN TOTAL: 0.4 mg/dL (ref 0.3–1.2)
BUN: 13 mg/dL (ref 6–23)
CHLORIDE: 108 meq/L (ref 96–112)
CO2: 22 meq/L (ref 19–32)
CREATININE: 0.4 mg/dL — AB (ref 0.50–1.35)
Calcium: 9.4 mg/dL (ref 8.4–10.5)
GFR calc Af Amer: >90 mL/min (ref >90)
Glucose, Bld: 103 mg/dL — ABNORMAL HIGH (ref 70–99)
Potassium: 3.9 mEq/L (ref 3.7–5.3)
Sodium: 142 mEq/L (ref 137–147)
Total Protein: 6.4 g/dL (ref 6.0–8.3)

## 2014-08-19 LAB — SALICYLATE LEVEL: Salicylate Lvl: <2 mg/dL — ABNORMAL LOW (ref 2.8–20.0)

## 2014-08-19 LAB — CBC
HCT: 29.1 % — ABNORMAL LOW (ref 39.0–52.0)
HEMOGLOBIN: 9.8 g/dL — AB (ref 13.0–17.0)
MCH: 25.8 pg — ABNORMAL LOW (ref 26.0–34.0)
MCHC: 33.7 g/dL (ref 30.0–36.0)
MCV: 76.6 fL — AB (ref 78.0–100.0)
PLATELETS: 99 10*3/uL — AB (ref 150–400)
RBC: 3.8 MIL/uL — AB (ref 4.22–5.81)
RDW: 15.1 % (ref 11.5–15.5)
WBC: 5.9 10*3/uL (ref 4.0–10.5)

## 2014-08-19 LAB — ACETAMINOPHEN LEVEL: Acetaminophen (Tylenol), Serum: <15 ug/mL (ref 10–30)

## 2014-08-19 LAB — ETHANOL

## 2014-08-19 LAB — TSH

## 2014-08-19 NOTE — ED Notes (Signed)
Report given to [name] at [facility], ok to send patient. Extra socks provided per his requested

## 2014-08-19 NOTE — ED Provider Notes (Signed)
Medical screening examination/treatment/procedure(s) were performed by non-physician practitioner and as supervising physician I was immediately available for consultation/collaboration.     Valda Christenson, MD 08/19/14 1351 

## 2014-08-19 NOTE — ED Notes (Signed)
Pt presents from [facility], Pt is IVC for SI, needs medical clearance including R ankle swelling/pain and ?thyroid disease. Pt states he has known goiter has was taking medication but is out at this time.

## 2014-08-19 NOTE — Discharge Instructions (Signed)
Please follow up with a primary care provider.   Depression Depression is feeling sad, low, down in the dumps, blue, gloomy, or empty. In general, there are two kinds of depression:  Normal sadness or grief. This can happen after something upsetting. It often goes away on its own within 2 weeks. After losing a loved one (bereavement), normal sadness and grief may last longer than two weeks. It usually gets better with time.  Clinical depression. This kind lasts longer than normal sadness or grief. It keeps you from doing the things you normally do in life. It is often hard to function at home, work, or at school. It may affect your relationships with others. Treatment is often needed. GET HELP RIGHT AWAY IF:  You have thoughts about hurting yourself or others.  You lose touch with reality (psychotic symptoms). You may:  See or hear things that are not real.  Have untrue beliefs about your life or people around you.  Your medicine is giving you problems. MAKE SURE YOU:  Understand these instructions.  Will watch your condition.  Will get help right away if you are not doing well or get worse. Document Released: 01/03/2011 Document Revised: 04/17/2014 Document Reviewed: 04/01/2012 [facility] Patient Information 2015 [place], [place]. This information is not intended to replace advice given to you by your health care provider. Make sure you discuss any questions you have with your health care provider.

## 2014-08-19 NOTE — ED Provider Notes (Signed)
CSN: [id number]     Arrival date & time 08/19/14  0045 History   First MD Initiated Contact with Patient 08/19/14 [phone]     Chief Complaint  Patient presents with  . Medical Clearance   HPI  History provided by the patient. Patient is a 53 year old male with history of substance abuse, depression previous suicidal ideations and alcohol abuse presenting under IVC for SI. Patient was transported by [facility] for medical clearance. He does have a place for further evaluation once medically cleared. Patient does report a prior history of thyroid goiter previously on levothyroxine and states he has not been on medicine for a while. He is not noticed any significant change in his goiter. Patient does however complain of some pain and swelling in his right ankle. He has been drinking heavily. Denies any previous history of gout. Denies any specific injury or trauma but states it is possible he may have injured his ankle while drinking. Denies any weakness or numbness. No other complaints. Denies any chest pain or palpitations or shortness of breath. Denies any night sweats, heat cold intolerance, lightheadedness or dizziness.   Past Medical History  Diagnosis Date  . Arthritis   . Substance abuse     Cocaine, marijuana, and alcohol  . Suicidal ideation 09/09/2012  . Chronic blood loss anemia 09/09/2012  . Rectal bleeding 09/08/2012  . Acute alcoholic pancreatitis 09/08/2012   Past Surgical History  Procedure Laterality Date  . Orthopedic surgery      Left foot surgery  . Left foot surgery    . Left cataract extraction     Family History  Problem Relation Age of Onset  . Heart disease Mother   . Cancer Father     prostate   History  Substance Use Topics  . Smoking status: Former Smoker -- 0.50 packs/day    Types: Cigarettes  . Smokeless tobacco: Former Neurosurgeon    Quit date: 09/16/2012  . Alcohol Use: 28.8 oz/week    48 Cans of beer per week    Review of Systems  All other systems  reviewed and are negative.     Allergies  Review of patient's allergies indicates no known allergies.  Home Medications   Prior to Admission medications   Not on File   BP 120/59  Pulse 88  Temp(Src) 98.2 F (36.8 C) (Oral)  Resp 16  SpO2 98% Physical Exam  Nursing note and vitals reviewed. Constitutional: He is oriented to person, place, and time. He appears well-developed and well-nourished. No distress.  HENT:  Head: Normocephalic.  Eyes: Conjunctivae are normal.  Slight exophthalmos  Neck: Thyromegaly present.  Large thyroid goiter  Cardiovascular: Normal rate and regular rhythm.   No murmur heard. Pulmonary/Chest: Effort normal and breath sounds normal. No respiratory distress. He has no wheezes.  Abdominal: Soft.  Musculoskeletal:  Mild to moderate swelling around the right ankle. No increased warmth. Skin normal. There is slight tenderness to palpation and with range of motion. Normal passive Range of motion. Normal dorsal pedal pulses. Normal sensation to light touch in the foot.  Neurological: He is alert and oriented to person, place, and time.  Skin: Skin is warm.  Psychiatric: His behavior is normal. He exhibits a depressed mood.    ED Course  Procedures   COORDINATION OF CARE:  Nursing notes reviewed. Vital signs reviewed. Initial pt interview and examination performed.   Filed Vitals:   08/19/14 0105  BP: 120/59  Pulse: 88  Temp: 98.2 F (  36.8 C)  TempSrc: Oral  Resp: 16  SpO2: 98%    3:01 AM-patient seen and evaluated. Patient appears well. No acute distress. Patient does have signs of slight anemia improved from previous. Also thrombocytopenia. Normal LFTs. Denies prior history of cirrhosis. Patient does appear to have significant chronic medical conditions however it acutely there is no concerning findings or signs of emergent condition. He is stable for further psychiatric care. Patient strongly encouraged to followup with a primary physician  for continued treatment of his anemia and thyroid.    Results for orders placed during the hospital encounter of 08/19/14  ACETAMINOPHEN LEVEL      Result Value Ref Range   Acetaminophen (Tylenol), Serum <15.0  10 - 30 ug/mL  CBC      Result Value Ref Range   WBC 5.9  4.0 - 10.5 K/uL   RBC 3.80 (*) 4.22 - 5.81 MIL/uL   Hemoglobin 9.8 (*) 13.0 - 17.0 g/dL   HCT 16.1 (*) 09.6 - 04.5 %   MCV 76.6 (*) 78.0 - 100.0 fL   MCH 25.8 (*) 26.0 - 34.0 pg   MCHC 33.7  30.0 - 36.0 g/dL   RDW 40.9  81.1 - 91.4 %   Platelets 99 (*) 150 - 400 K/uL  COMPREHENSIVE METABOLIC PANEL      Result Value Ref Range   Sodium 142  137 - 147 mEq/L   Potassium 3.9  3.7 - 5.3 mEq/L   Chloride 108  96 - 112 mEq/L   CO2 22  19 - 32 mEq/L   Glucose, Bld 103 (*) 70 - 99 mg/dL   BUN 13  6 - 23 mg/dL   Creatinine, Ser 7.82 (*) 0.50 - 1.35 mg/dL   Calcium 9.4  8.4 - 95.6 mg/dL   Total Protein 6.4  6.0 - 8.3 g/dL   Albumin 3.0 (*) 3.5 - 5.2 g/dL   AST 24  0 - 37 U/L   ALT 25  0 - 53 U/L   Alkaline Phosphatase 231 (*) 39 - 117 U/L   Total Bilirubin 0.4  0.3 - 1.2 mg/dL   GFR calc non Af Amer >90  >90 mL/min   GFR calc Af Amer >90  >90 mL/min   Anion gap 12  5 - 15  ETHANOL      Result Value Ref Range   Alcohol, Ethyl (B) <11  0 - 11 mg/dL  SALICYLATE LEVEL      Result Value Ref Range   Salicylate Lvl <2.0 (*) 2.8 - 20.0 mg/dL  URINE RAPID DRUG SCREEN (HOSP PERFORMED)      Result Value Ref Range   Opiates NONE DETECTED  NONE DETECTED   Cocaine POSITIVE (*) NONE DETECTED   Benzodiazepines NONE DETECTED  NONE DETECTED   Amphetamines NONE DETECTED  NONE DETECTED   Tetrahydrocannabinol POSITIVE (*) NONE DETECTED   Barbiturates NONE DETECTED  NONE DETECTED     Imaging Review Dg Ankle Complete Right  08/19/2014   CLINICAL DATA:  Generalized right ankle pain for several days. No injury.  EXAM: RIGHT ANKLE - COMPLETE 3+ VIEW  COMPARISON:  None.  FINDINGS: Diffuse bone demineralization. There is no evidence of  fracture, dislocation, or joint effusion. There is no evidence of arthropathy or other focal bone abnormality. Soft tissues are unremarkable.  IMPRESSION: No acute bony abnormalities.   Electronically Signed   By: Burman Nieves M.D.   On: 08/19/2014 02:07     MDM   Final diagnoses:  Polysubstance abuse  Depression  Goiter      Angus Seller, [place] 08/19/14 [phone]

## 2014-08-20 ENCOUNTER — Encounter (HOSPITAL_COMMUNITY): Payer: Self-pay | Admitting: Emergency Medicine

## 2014-08-20 ENCOUNTER — Emergency Department (HOSPITAL_COMMUNITY)
Admission: EM | Admit: 2014-08-20 | Discharge: 2014-08-20 | Disposition: A | Discharge status: Home / Self Care | Payer: Medicare Other | Attending: Emergency Medicine | Admitting: Emergency Medicine

## 2014-08-20 DIAGNOSIS — H109 Unspecified conjunctivitis: Secondary | ICD-10-CM | POA: Diagnosis not present

## 2014-08-20 DIAGNOSIS — Z862 Personal history of diseases of the blood and blood-forming organs and certain disorders involving the immune mechanism: Secondary | ICD-10-CM | POA: Insufficient documentation

## 2014-08-20 DIAGNOSIS — R109 Unspecified abdominal pain: Secondary | ICD-10-CM | POA: Insufficient documentation

## 2014-08-20 DIAGNOSIS — Z87891 Personal history of nicotine dependence: Secondary | ICD-10-CM | POA: Insufficient documentation

## 2014-08-20 DIAGNOSIS — H5710 Ocular pain, unspecified eye: Secondary | ICD-10-CM | POA: Diagnosis present

## 2014-08-20 DIAGNOSIS — R197 Diarrhea, unspecified: Secondary | ICD-10-CM | POA: Diagnosis not present

## 2014-08-20 DIAGNOSIS — Z8739 Personal history of other diseases of the musculoskeletal system and connective tissue: Secondary | ICD-10-CM | POA: Diagnosis not present

## 2014-08-20 DIAGNOSIS — Z8719 Personal history of other diseases of the digestive system: Secondary | ICD-10-CM | POA: Insufficient documentation

## 2014-08-20 MED ORDER — TETRACAINE HCL 0.5 % OP SOLN
1.0000 [drp] | Freq: Once | OPHTHALMIC | Status: AC
  Administered 2014-08-20: 1 [drp] via OPHTHALMIC
  Filled 2014-08-20: qty 2

## 2014-08-20 MED ORDER — TOBRAMYCIN 0.3 % OP SOLN: 1.0000 [drp] | OPHTHALMIC | Status: DC

## 2014-08-20 MED ORDER — FLUORESCEIN SODIUM 1 MG OP STRP
1.0000 | ORAL_STRIP | Freq: Once | OPHTHALMIC | Status: AC
  Administered 2014-08-20: 1 via OPHTHALMIC
  Filled 2014-08-20: qty 1

## 2014-08-20 NOTE — ED Notes (Signed)
PA at bedside.

## 2014-08-20 NOTE — ED Notes (Signed)
Initial contact - pt resting on stretcher with CO at bedside.  Pt reports c/o lower abd pain "for months" but unable to be seen because of insurance issues.  Pt reports "can't keep anything down", reports multiple episodes vomiting and diarrhea.  Pt also c/o bilat eye pain, L worse than R.  L eye red and running and mildly swollen.  Pt denies fevers.  Speaking full/clear sentences, rr even/un-lab.  Abd TTP lower quad, soft.  Self repositioning for comfort.  NAD.

## 2014-08-20 NOTE — ED Notes (Signed)
Pt reports eye pain is "much better" after tetracaine drops given by PA.

## 2014-08-20 NOTE — Discharge Instructions (Signed)

## 2014-08-20 NOTE — ED Provider Notes (Signed)
CSN: [id number]     Arrival date & time 08/20/14  4098 History   First MD Initiated Contact with Patient 08/20/14 1012     Chief Complaint  Patient presents with  . Eye Problem  . Abdominal Pain  . Diarrhea     (Consider location/radiation/quality/duration/timing/severity/associated sxs/prior Treatment) HPI Phillip Kemp is a 53 y.o. male with a history of substance abuse, alcoholic pancreatitis comes in for evaluation of left eye discomfort and diarrhea. Patient states by discomfort has been a problem for him every other day for the past year. He reports his left eye seemed a little blurry today but his right eye is fine. He says he feels like there might be something stuck in his eye. He has not tried anything for his high discomfort. Nothing makes it better or worse. He also says that he has had diarrhea for the past year and every time he tries to eat something he just goes right through him. He denies any bloody stools or overt abdominal pain. He denies fevers, nausea vomiting, difficulty breathing, numbness or weakness.  Past Medical History  Diagnosis Date  . Arthritis   . Substance abuse     Cocaine, marijuana, and alcohol  . Suicidal ideation 09/09/2012  . Chronic blood loss anemia 09/09/2012  . Rectal bleeding 09/08/2012  . Acute alcoholic pancreatitis 09/08/2012   Past Surgical History  Procedure Laterality Date  . Orthopedic surgery      Left foot surgery  . Left foot surgery    . Left cataract extraction     Family History  Problem Relation Age of Onset  . Heart disease Mother   . Cancer Father     prostate   History  Substance Use Topics  . Smoking status: Former Smoker -- 0.50 packs/day    Types: Cigarettes  . Smokeless tobacco: Former Neurosurgeon    Quit date: 09/16/2012  . Alcohol Use: 28.8 oz/week    48 Cans of beer per week    Review of Systems  Constitutional: Negative for fever.  Eyes: Positive for pain and redness.  Respiratory: Negative for shortness  of breath.   Cardiovascular: Negative for chest pain.  Gastrointestinal: Positive for diarrhea.  Skin: Negative for rash.      Allergies  Bee venom  Home Medications   Prior to Admission medications   Medication Sig Start Date End Date Taking? Authorizing Provider  tobramycin (TOBREX) 0.3 % ophthalmic solution Place 1 drop into the left eye every 4 (four) hours. Place one drop in the affected eye every 4 hours for 7 days 08/20/14   Earle Gell Jaryah Aracena, PA-C   BP 94/53  Pulse 90  Temp(Src) 98.4 F (36.9 C) (Oral)  Resp 18  SpO2 100% Physical Exam  Nursing note and vitals reviewed. Constitutional:  Awake, alert, nontoxic appearance.  HENT:  Head: Atraumatic.  Eyes: EOM are normal. Pupils are equal, round, and reactive to light. Right eye exhibits no discharge. Left eye exhibits no discharge. No scleral icterus.  Global conjunctivitis the left eye both medial and lateral.  No discharge noted. Upon evaluation with Wood's lamp and fluorescein staining no obvious corneal abrasion. No hyphema. No Seidel sign. Intraocular pressures 6 on the right eye and 18 on the left eye  Neck: Neck supple.  Pulmonary/Chest: Effort normal. He exhibits no tenderness.  Abdominal: Soft. There is no tenderness. There is no rebound.  Musculoskeletal: He exhibits no tenderness.  Baseline ROM, no obvious new focal weakness.  Neurological:  Mental  status and motor strength appears baseline for patient and situation.  Skin: No rash noted.  Psychiatric: He has a normal mood and affect.    ED Course  Procedures (including critical care time) Labs Review Labs Reviewed - No data to display  Imaging Review Dg Ankle Complete Right  08/19/2014   CLINICAL DATA:  Generalized right ankle pain for several days. No injury.  EXAM: RIGHT ANKLE - COMPLETE 3+ VIEW  COMPARISON:  None.  FINDINGS: Diffuse bone demineralization. There is no evidence of fracture, dislocation, or joint effusion. There is no evidence of  arthropathy or other focal bone abnormality. Soft tissues are unremarkable.  IMPRESSION: No acute bony abnormalities.   Electronically Signed   By: Burman Nieves M.D.   On: 08/19/2014 02:07     EKG Interpretation None     Meds given in ED:  Medications  tetracaine (PONTOCAINE) 0.5 % ophthalmic solution 1 drop (not administered)  fluorescein ophthalmic strip 1 strip (not administered)    New Prescriptions   TOBRAMYCIN (TOBREX) 0.3 % OPHTHALMIC SOLUTION    Place 1 drop into the left eye every 4 (four) hours. Place one drop in the affected eye every 4 hours for 7 days   Filed Vitals:   08/20/14 1004  BP: 94/53  Pulse: 90  Temp: 98.4 F (36.9 C)  Resp: 18    MDM  Vitals stable - WNL -afebrile Pt resting comfortably in ED. Visual Acuity 20/30 both eyes. Woods lamp evaluation showed no evidence of corneal abrasion. Tonometry yielded intraocular pressures of 18 in the right eye and 6 in the left eye. Will treat with tobramycin drops for empiric treatment of conjunctivitis. No concern for acute angle closure glaucoma, ruptured globe, zoster or any other emergent pathology at this time. Diarrhea appears to be a chronic problem. Labs drawn yesterday are essentially normal and do not suggest an emergent cause for the diarrhea. Recommended adequate hydration and alcohol cessation.  Recommended strict followup with ophthalmology and/or his primary care provider Discussed f/u with PCP and return precautions, pt very amenable to plan.   Final diagnoses:  Conjunctivitis of left eye  Prior to patient discharge, I discussed and reviewed this case with Dr.Belfi         Sharlene Motts, PA-C 08/20/14 1329

## 2014-08-20 NOTE — ED Provider Notes (Signed)
Medical screening examination/treatment/procedure(s) were conducted as a shared visit with non-physician practitioner(s) and myself.  I personally evaluated the patient during the encounter.   EKG Interpretation None      Pt with left conjunctival irritation, says that he has had similar problems in both eyes for about a year.  No drainage.  No vision loss.  Pressure ok.  Non-staining on woods lamp.  Will tx with abx, f/u with eye.  Has had ongoing chronic loose stools.  Has labs yesterday for same.  abd nontender.  Don't feel any further work up indicated today in the ED.  Rolan Bucco, MD 08/20/14 [phone]

## 2014-08-20 NOTE — ED Notes (Signed)
Pt c/o bilateral eye pain and irritation since this morning.  Pt also c/o gen abd pain w/ diarrhea x 1 wk. Pt IVC'd from [facility].  Will return once cleared.

## 2014-11-21 ENCOUNTER — Inpatient Hospital Stay (HOSPITAL_COMMUNITY)
Admission: EM | Admit: 2014-11-21 | Discharge: 2014-11-25 | DRG: 885 | Disposition: A | Discharge status: Home Health | Payer: 59 | Source: Intra-hospital | Attending: Psychiatry | Admitting: Psychiatry

## 2014-11-21 ENCOUNTER — Encounter (HOSPITAL_COMMUNITY): Payer: Self-pay

## 2014-11-21 ENCOUNTER — Emergency Department (HOSPITAL_COMMUNITY)
Admission: EM | Admit: 2014-11-21 | Discharge: 2014-11-21 | Disposition: A | Discharge status: Other Acute Inpatient Hospital | Payer: Medicare Other | Attending: Emergency Medicine | Admitting: Emergency Medicine

## 2014-11-21 ENCOUNTER — Emergency Department (HOSPITAL_COMMUNITY): Payer: Medicare Other

## 2014-11-21 DIAGNOSIS — F314 Bipolar disorder, current episode depressed, severe, without psychotic features: Secondary | ICD-10-CM | POA: Diagnosis present

## 2014-11-21 DIAGNOSIS — Z8249 Family history of ischemic heart disease and other diseases of the circulatory system: Secondary | ICD-10-CM | POA: Diagnosis not present

## 2014-11-21 DIAGNOSIS — D500 Iron deficiency anemia secondary to blood loss (chronic): Secondary | ICD-10-CM | POA: Insufficient documentation

## 2014-11-21 DIAGNOSIS — Z8739 Personal history of other diseases of the musculoskeletal system and connective tissue: Secondary | ICD-10-CM | POA: Diagnosis not present

## 2014-11-21 DIAGNOSIS — R45851 Suicidal ideations: Secondary | ICD-10-CM | POA: Diagnosis present

## 2014-11-21 DIAGNOSIS — Z79899 Other long term (current) drug therapy: Secondary | ICD-10-CM | POA: Diagnosis not present

## 2014-11-21 DIAGNOSIS — Z8042 Family history of malignant neoplasm of prostate: Secondary | ICD-10-CM | POA: Diagnosis not present

## 2014-11-21 DIAGNOSIS — E0500 Thyrotoxicosis with diffuse goiter without thyrotoxic crisis or storm: Secondary | ICD-10-CM | POA: Diagnosis present

## 2014-11-21 DIAGNOSIS — F419 Anxiety disorder, unspecified: Secondary | ICD-10-CM | POA: Diagnosis present

## 2014-11-21 DIAGNOSIS — E039 Hypothyroidism, unspecified: Secondary | ICD-10-CM | POA: Diagnosis present

## 2014-11-21 DIAGNOSIS — Z590 Homelessness: Secondary | ICD-10-CM

## 2014-11-21 DIAGNOSIS — E43 Unspecified severe protein-calorie malnutrition: Secondary | ICD-10-CM | POA: Diagnosis present

## 2014-11-21 DIAGNOSIS — E049 Nontoxic goiter, unspecified: Secondary | ICD-10-CM | POA: Diagnosis present

## 2014-11-21 DIAGNOSIS — E010 Iodine-deficiency related diffuse (endemic) goiter: Secondary | ICD-10-CM

## 2014-11-21 DIAGNOSIS — S0592XA Unspecified injury of left eye and orbit, initial encounter: Secondary | ICD-10-CM | POA: Diagnosis present

## 2014-11-21 DIAGNOSIS — E079 Disorder of thyroid, unspecified: Secondary | ICD-10-CM | POA: Diagnosis present

## 2014-11-21 DIAGNOSIS — Z87891 Personal history of nicotine dependence: Secondary | ICD-10-CM

## 2014-11-21 DIAGNOSIS — K5900 Constipation, unspecified: Secondary | ICD-10-CM | POA: Diagnosis not present

## 2014-11-21 DIAGNOSIS — Y9389 Activity, other specified: Secondary | ICD-10-CM | POA: Diagnosis not present

## 2014-11-21 DIAGNOSIS — F319 Bipolar disorder, unspecified: Secondary | ICD-10-CM | POA: Insufficient documentation

## 2014-11-21 DIAGNOSIS — F1020 Alcohol dependence, uncomplicated: Secondary | ICD-10-CM | POA: Diagnosis present

## 2014-11-21 DIAGNOSIS — F1410 Cocaine abuse, uncomplicated: Secondary | ICD-10-CM | POA: Diagnosis present

## 2014-11-21 DIAGNOSIS — Y998 Other external cause status: Secondary | ICD-10-CM | POA: Diagnosis not present

## 2014-11-21 DIAGNOSIS — R197 Diarrhea, unspecified: Secondary | ICD-10-CM | POA: Insufficient documentation

## 2014-11-21 DIAGNOSIS — Y929 Unspecified place or not applicable: Secondary | ICD-10-CM | POA: Diagnosis not present

## 2014-11-21 DIAGNOSIS — F1210 Cannabis abuse, uncomplicated: Secondary | ICD-10-CM | POA: Insufficient documentation

## 2014-11-21 DIAGNOSIS — M1990 Unspecified osteoarthritis, unspecified site: Secondary | ICD-10-CM | POA: Diagnosis present

## 2014-11-21 DIAGNOSIS — S0590XA Unspecified injury of unspecified eye and orbit, initial encounter: Secondary | ICD-10-CM

## 2014-11-21 DIAGNOSIS — X788XXA Intentional self-harm by other sharp object, initial encounter: Secondary | ICD-10-CM | POA: Diagnosis not present

## 2014-11-21 HISTORY — DX: Depression, unspecified: F32.A

## 2014-11-21 HISTORY — DX: Major depressive disorder, single episode, unspecified: F32.9

## 2014-11-21 LAB — CBC
HCT: 30.4 % — ABNORMAL LOW (ref 39.0–52.0)
Hemoglobin: 9.8 g/dL — ABNORMAL LOW (ref 13.0–17.0)
MCH: 25.3 pg — ABNORMAL LOW (ref 26.0–34.0)
MCHC: 32.2 g/dL (ref 30.0–36.0)
MCV: 78.6 fL (ref 78.0–100.0)
Platelets: 131 K/uL — ABNORMAL LOW (ref 150–400)
RBC: 3.87 MIL/uL — ABNORMAL LOW (ref 4.22–5.81)
RDW: 15.8 % — ABNORMAL HIGH (ref 11.5–15.5)
WBC: 6.9 K/uL (ref 4.0–10.5)

## 2014-11-21 LAB — COMPREHENSIVE METABOLIC PANEL
ALBUMIN: 3.3 g/dL — AB (ref 3.5–5.2)
ALT: 25 U/L (ref 0–53)
AST: 28 U/L (ref 0–37)
Alkaline Phosphatase: 219 U/L — ABNORMAL HIGH (ref 39–117)
Anion gap: 16 — ABNORMAL HIGH (ref 5–15)
BILIRUBIN TOTAL: 0.5 mg/dL (ref 0.3–1.2)
BUN: 16 mg/dL (ref 6–23)
CO2: 20 meq/L (ref 19–32)
Calcium: 9.5 mg/dL (ref 8.4–10.5)
Chloride: 103 mEq/L (ref 96–112)
Creatinine, Ser: 0.45 mg/dL — ABNORMAL LOW (ref 0.50–1.35)
GFR calc Af Amer: >90 mL/min (ref >90)
Glucose, Bld: 94 mg/dL (ref 70–99)
POTASSIUM: 3.7 meq/L (ref 3.7–5.3)
SODIUM: 139 meq/L (ref 137–147)
Total Protein: 7.2 g/dL (ref 6.0–8.3)

## 2014-11-21 LAB — RAPID URINE DRUG SCREEN, HOSP PERFORMED
Amphetamines: NOT DETECTED
Barbiturates: NOT DETECTED
Benzodiazepines: NOT DETECTED
Cocaine: POSITIVE — AB
Opiates: NOT DETECTED
Tetrahydrocannabinol: POSITIVE — AB

## 2014-11-21 LAB — URINALYSIS, ROUTINE W REFLEX MICROSCOPIC
GLUCOSE, UA: NEGATIVE mg/dL
KETONES UR: NEGATIVE mg/dL
NITRITE: NEGATIVE
PH: 5.5 (ref 5.0–8.0)
Protein, ur: NEGATIVE mg/dL
Specific Gravity, Urine: 1.022 (ref 1.005–1.030)
Urobilinogen, UA: 1 mg/dL (ref 0.0–1.0)

## 2014-11-21 LAB — SALICYLATE LEVEL: Salicylate Lvl: <2 mg/dL — ABNORMAL LOW (ref 2.8–20.0)

## 2014-11-21 LAB — TROPONIN I: Troponin I: <0.3 ng/mL

## 2014-11-21 LAB — ACETAMINOPHEN LEVEL

## 2014-11-21 LAB — URINE MICROSCOPIC-ADD ON

## 2014-11-21 LAB — ETHANOL: Alcohol, Ethyl (B): <11 mg/dL (ref 0–11)

## 2014-11-21 LAB — SYPHILIS: RPR W/REFLEX TO RPR TITER AND TREPONEMAL ANTIBODIES, TRADITIONAL SCREENING AND DIAGNOSIS ALGORITHM

## 2014-11-21 LAB — HIV ANTIBODY (ROUTINE TESTING W REFLEX): HIV 1&2 Ab, 4th Generation: NONREACTIVE

## 2014-11-21 MED ORDER — HYDROXYZINE HCL 25 MG PO TABS
25.0000 mg | ORAL_TABLET | Freq: Four times a day (QID) | ORAL | Status: DC | PRN
  Administered 2014-11-22: 25 mg via ORAL
  Filled 2014-11-21: qty 1

## 2014-11-21 MED ORDER — CEFTRIAXONE SODIUM 250 MG IJ SOLR
250.0000 mg | Freq: Once | INTRAMUSCULAR | Status: AC
  Administered 2014-11-21: 250 mg via INTRAMUSCULAR
  Filled 2014-11-21: qty 250

## 2014-11-21 MED ORDER — ZOLPIDEM TARTRATE 5 MG PO TABS: 5.0000 mg | ORAL_TABLET | Freq: Every evening | ORAL | Status: DC | PRN

## 2014-11-21 MED ORDER — DOXYCYCLINE HYCLATE 100 MG PO TABS
100.0000 mg | ORAL_TABLET | Freq: Once | ORAL | Status: AC
  Administered 2014-11-21: 100 mg via ORAL
  Filled 2014-11-21: qty 1

## 2014-11-21 MED ORDER — ALUM & MAG HYDROXIDE-SIMETH 200-200-20 MG/5ML PO SUSP: 30.0000 mL | ORAL | Status: DC | PRN

## 2014-11-21 MED ORDER — ASPIRIN 81 MG PO CHEW
324.0000 mg | CHEWABLE_TABLET | Freq: Every day | ORAL | Status: DC
  Administered 2014-11-22 – 2014-11-25 (×4): 324 mg via ORAL
  Filled 2014-11-21 (×7): qty 4

## 2014-11-21 MED ORDER — ACETAMINOPHEN 325 MG PO TABS
650.0000 mg | ORAL_TABLET | Freq: Four times a day (QID) | ORAL | Status: DC | PRN
  Administered 2014-11-22: 650 mg via ORAL
  Filled 2014-11-21: qty 2

## 2014-11-21 MED ORDER — TRAZODONE HCL 50 MG PO TABS: 50.0000 mg | ORAL_TABLET | Freq: Every evening | ORAL | Status: DC | PRN

## 2014-11-21 MED ORDER — LORAZEPAM 1 MG PO TABS: 1.0000 mg | ORAL_TABLET | Freq: Three times a day (TID) | ORAL | Status: DC | PRN

## 2014-11-21 MED ORDER — ONDANSETRON HCL 4 MG PO TABS: 4.0000 mg | ORAL_TABLET | Freq: Three times a day (TID) | ORAL | Status: DC | PRN

## 2014-11-21 MED ORDER — LIDOCAINE HCL 1 % IJ SOLN
INTRAMUSCULAR | Status: AC
  Administered 2014-11-21: 20 mL
  Filled 2014-11-21: qty 20

## 2014-11-21 MED ORDER — TOBRAMYCIN 0.3 % OP SOLN
1.0000 [drp] | OPHTHALMIC | Status: DC
  Administered 2014-11-22 – 2014-11-25 (×15): 1 [drp] via OPHTHALMIC
  Filled 2014-11-21 (×3): qty 5

## 2014-11-21 MED ORDER — ERYTHROMYCIN 5 MG/GM OP OINT
TOPICAL_OINTMENT | Freq: Once | OPHTHALMIC | Status: AC
  Administered 2014-11-21: 19:00:00 via OPHTHALMIC
  Filled 2014-11-21: qty 3.5

## 2014-11-21 MED ORDER — ACETAMINOPHEN 325 MG PO TABS: 650.0000 mg | ORAL_TABLET | ORAL | Status: DC | PRN

## 2014-11-21 MED ORDER — IBUPROFEN 200 MG PO TABS: 600.0000 mg | ORAL_TABLET | Freq: Three times a day (TID) | ORAL | Status: DC | PRN

## 2014-11-21 MED ORDER — TOBRAMYCIN 0.3 % OP SOLN: 1.0000 [drp] | OPHTHALMIC | Status: DC

## 2014-11-21 MED ORDER — TETRACAINE HCL 0.5 % OP SOLN
1.0000 [drp] | Freq: Once | OPHTHALMIC | Status: AC
  Administered 2014-11-21: 2 [drp] via OPHTHALMIC
  Filled 2014-11-21: qty 2

## 2014-11-21 MED ORDER — MAGNESIUM HYDROXIDE 400 MG/5ML PO SUSP
30.0000 mL | Freq: Every day | ORAL | Status: DC | PRN
  Administered 2014-11-24: 30 mL via ORAL
  Filled 2014-11-21: qty 30

## 2014-11-21 MED ORDER — LOPERAMIDE HCL 2 MG PO CAPS
4.0000 mg | ORAL_CAPSULE | ORAL | Status: DC | PRN
  Administered 2014-11-22 – 2014-11-24 (×4): 4 mg via ORAL
  Filled 2014-11-21 (×5): qty 2

## 2014-11-21 MED ORDER — FLUORESCEIN SODIUM 1 MG OP STRP
1.0000 | ORAL_STRIP | Freq: Once | OPHTHALMIC | Status: AC
  Administered 2014-11-21: 1 via OPHTHALMIC
  Filled 2014-11-21: qty 1

## 2014-11-21 MED ORDER — DOXYCYCLINE HYCLATE 100 MG PO TABS
100.0000 mg | ORAL_TABLET | Freq: Two times a day (BID) | ORAL | Status: DC
  Administered 2014-11-22 – 2014-11-25 (×7): 100 mg via ORAL
  Filled 2014-11-21 (×12): qty 1
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
## 2014-11-21 NOTE — ED Notes (Signed)
Pt awake, alert & responsive, no distress noted, Resting at present,  Will continue to monitor for safety.

## 2014-11-21 NOTE — ED Notes (Signed)
Therapeutic [facility] at bedside.
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
## 2014-11-21 NOTE — ED Notes (Signed)
Bed: WLPT4 Expected date:  Expected time:  Means of arrival:  Comments: EMS-detox/SI

## 2014-11-21 NOTE — BH Assessment (Signed)
Writer informed TTS Kristein of the consult.  
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
## 2014-11-21 NOTE — ED Notes (Signed)
Report to RN Kathlene NovemberMike, [facility], [facility] transport requested.

## 2014-11-21 NOTE — ED Notes (Signed)
Pt changed into scrubs and wanded by security  

## 2014-11-21 NOTE — ED Notes (Signed)
MD at bedside. 

## 2014-11-21 NOTE — ED Notes (Signed)
Pt's camo duffel bag and 1 personal belonging bag given to SAPP Unit staff.

## 2014-11-21 NOTE — ED Notes (Signed)
Attempted to given report to [employer] Mike.  RN to consult with PA Karleen HampshireSpencer in reference to B/P.

## 2014-11-21 NOTE — ED Notes (Signed)
[facility] transport [facility]

## 2014-11-22 ENCOUNTER — Encounter (HOSPITAL_COMMUNITY): Payer: Self-pay | Admitting: Psychiatry

## 2014-11-22 DIAGNOSIS — E039 Hypothyroidism, unspecified: Secondary | ICD-10-CM | POA: Diagnosis present

## 2014-11-22 DIAGNOSIS — E079 Disorder of thyroid, unspecified: Secondary | ICD-10-CM | POA: Diagnosis present

## 2014-11-22 DIAGNOSIS — F1020 Alcohol dependence, uncomplicated: Secondary | ICD-10-CM | POA: Diagnosis present

## 2014-11-22 DIAGNOSIS — E0500 Thyrotoxicosis with diffuse goiter without thyrotoxic crisis or storm: Secondary | ICD-10-CM

## 2014-11-22 DIAGNOSIS — E43 Unspecified severe protein-calorie malnutrition: Secondary | ICD-10-CM

## 2014-11-22 DIAGNOSIS — E049 Nontoxic goiter, unspecified: Secondary | ICD-10-CM

## 2014-11-22 DIAGNOSIS — F319 Bipolar disorder, unspecified: Secondary | ICD-10-CM

## 2014-11-22 LAB — HEMOGLOBIN A1C
HEMOGLOBIN A1C: 5 % (ref <5.7)
MEAN PLASMA GLUCOSE: 97 mg/dL (ref <117)

## 2014-11-22 LAB — TSH
TSH: <0.005 u[IU]/mL — ABNORMAL LOW (ref 0.350–4.500)
TSH: <0.005 u[IU]/mL — ABNORMAL LOW (ref 0.350–4.500)

## 2014-11-22 LAB — LIPID PANEL
CHOL/HDL RATIO: 1.7 ratio
Cholesterol: 104 mg/dL (ref 0–200)
HDL: 61 mg/dL (ref >39)
LDL CALC: 30 mg/dL (ref 0–99)
Triglycerides: 63 mg/dL (ref <150)
VLDL: 13 mg/dL (ref 0–40)

## 2014-11-22 LAB — GC/CHLAMYDIA PROBE AMP
CT Probe RNA: NEGATIVE
GC PROBE AMP APTIMA: NEGATIVE

## 2014-11-22 MED ORDER — ENSURE COMPLETE PO LIQD
237.0000 mL | Freq: Three times a day (TID) | ORAL | Status: DC
  Administered 2014-11-22 – 2014-11-25 (×11): 237 mL via ORAL

## 2014-11-22 MED ORDER — MIRTAZAPINE 15 MG PO TABS
15.0000 mg | ORAL_TABLET | Freq: Every day | ORAL | Status: DC
  Administered 2014-11-22: 15 mg via ORAL
  Filled 2014-11-22 (×3): qty 1

## 2014-11-22 MED ORDER — TRAZODONE HCL 50 MG PO TABS
50.0000 mg | ORAL_TABLET | Freq: Every evening | ORAL | Status: DC | PRN
  Administered 2014-11-22: 50 mg via ORAL
  Filled 2014-11-22 (×8): qty 1

## 2014-11-22 MED ORDER — CHLORDIAZEPOXIDE HCL 25 MG PO CAPS
25.0000 mg | ORAL_CAPSULE | Freq: Four times a day (QID) | ORAL | Status: DC | PRN
  Administered 2014-11-23: 25 mg via ORAL
  Filled 2014-11-22: qty 1

## 2014-11-22 MED ORDER — ADULT MULTIVITAMIN W/MINERALS CH
1.0000 | ORAL_TABLET | Freq: Every day | ORAL | Status: DC
  Administered 2014-11-22 – 2014-11-25 (×4): 1 via ORAL
  Filled 2014-11-22 (×7): qty 1

## 2014-11-22 NOTE — Tx Team (Signed)
Interdisciplinary Treatment Plan Update (Adult)  Date: 11/22/2014 Time Reviewed: 1:29 PM  Progress in Treatment:  Attending groups: Yes Participating in groups:  Yes Taking medication as prescribed: Yes  Tolerating medication: Yes  Family/Significant othe contact made:No, not yet  Patient understands diagnosis: Yes AEB seeking help for SI  Discussing patient identified problems/goals with staff: Yes  Medical problems stabilized or resolved: Yes  Denies suicidal/homicidal ideation: No, contracts for safety  Patient has not harmed self or Others: Yes  New problem(s) identified: N/A Discharge Plan or Barriers: Pt plans to go to a residential substance abuse treatment program.  Additional comments:Phillip Kemp is 53 years old. African-American male. He reports, "[facility] I was drinking a lot of alcohol & using drugs. My sister was suppose to remove the dog from the apt because our landlord does not want a pet in his building. We got evicted because she did not remove the dosg. Now I'm homeless. I have been drinking & using drugs off and on x 10 days. I use & drink because I'm depressed. 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 I also accidentally cut off my right 2 toes when I was 15 with a lawn mower. I have never been on medication for depression. I was suicidal yesterday and still is today. I don't have a plan to hurt myself or anyone else. I have never attempted suicide in the past. I was in this hospital 2 years ago for my depression. I need help for my depression, substance use and a place to live". Pt and CSW Intern reviewed pt's identified goals and treatment plan. Pt verbalized understanding and agreed to treatment plan  Reason for Continuation of Hospitalization:  SI Depression  Medication Stabilization  Withdrawal symptoms Estimated length of stay: 4-5 days   Attendees:  Patient:   11/22/2014 1:29 PM   Family:  12/9/20151:29 PM   Physician: Dr. Elna BreslowEappen MD  12/9/20151:29 PM  Nursing: Philis FendtBeverly King, RN 11/22/2014 1:29 PM  Clinical Social Worker: Daryel Geraldodney Asharia Lotter, LCSW  12/9/20151:29 PM  Clinical Social Worker: Charleston Ropesandace [name], CSW Intern 12/9/20151:29 PM  Other:  12/9/20151:29 PM  Other:  12/9/20151:29 PM  Other:  12/9/20151:29 PM  Scribe for Treatment Team:  Charleston Ropesandace [name], CSW Intern 11/22/2014 1:29 PM
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
## 2014-11-22 NOTE — Tx Team (Signed)
Initial Interdisciplinary Treatment Plan   PATIENT STRESSORS: Financial difficulties Health problems Marital or family conflict Substance abuse   PATIENT STRENGTHS: General fund of knowledge Motivation for treatment/growth   PROBLEM LIST: Problem List/Patient Goals Date to be addressed Date deferred Reason deferred Estimated date of resolution  Risk for suicide 11/21/14     housing 11/21/14     Family conflict 11/21/14     Unintended weight loss 11/21/14                                    DISCHARGE CRITERIA:  Ability to meet basic life and health needs Improved stabilization in mood, thinking, and/or behavior Safe-care adequate arrangements made Verbal commitment to aftercare and medication compliance  PRELIMINARY DISCHARGE PLAN: Attend aftercare/continuing care group Outpatient therapy  PATIENT/FAMIILY INVOLVEMENT: This treatment plan has been presented to and reviewed with the patient, Phillip Kemp.  The patient and family have been given the opportunity to ask questions and make suggestions.  [name], [name] A 11/22/2014, 12:40 AM

## 2014-11-22 NOTE — Progress Notes (Signed)
NUTRITION ASSESSMENT  Pt identified as at risk on the Malnutrition Screen Tool  INTERVENTION: 1. Educated patient on the importance of nutrition and encouraged intake of food and beverages. 2. Discussed weight goals. 3. Supplements: Ensure Complete po TID, each supplement provides 350 kcal and 13 grams of protein 4. Multivitamin with minerals daily   NUTRITION DIAGNOSIS: Unintentional weight loss related to sub-optimal intake as evidenced by pt report.   Goal: Pt to meet >/= 90% of their estimated nutrition needs.  Monitor:  PO intake  Assessment:  53 y.o. patient admitted with substance abuse, depression, goiter, and poor appetite.  Pt reports very low appetite. PTA pt was not eating well. States that he has lost 60 lb over the last year. Per weight history documentation, pt has lost 15 lb since over a year ago (12% wt loss x >1 year, insignificant for time frame).  Pt presents with noticeable signs of muscle and fat depletion in orbital, temporal, upper arm, and clavicle regions. BMI is 14.7.  Pt meets criteria for severe MALNUTRITION in the context of decreased appetite as evidenced by severe muscle and fat depletion and energy intake <75% for >1 month.  Height: Ht Readings from Last 1 Encounters:  11/21/14 6\' 2"  (1.88 m)    Weight: Wt Readings from Last 1 Encounters:  11/21/14 114 lb (51.71 kg)    Weight Hx: Wt Readings from Last 10 Encounters:  11/21/14 114 lb (51.71 kg)  07/15/13 129 lb (58.514 kg)  12/05/12 131 lb (59.421 kg)  11/15/12 140 lb (63.504 kg)  10/20/12 141 lb (63.957 kg)  09/24/12 131 lb 14.4 oz (59.829 kg)  09/11/12 138 lb (62.596 kg)  09/09/12 141 lb 5 oz (64.1 kg)  07/11/12 157 lb (71.215 kg)  08/27/11 167 lb 1.9 oz (75.805 kg)    BMI:  Body mass index is 14.63 kg/(m^2). Pt meets criteria for underweight based on current BMI.  Estimated Nutritional Needs: Kcal: 30-35 kcal/kg Protein: > 1 gram protein/kg Fluid: 1 ml/kcal  Diet Order:  Diet regular Pt is also offered choice of unit snacks mid-morning and mid-afternoon.  Pt is eating as desired.   Lab results and medications reviewed.   Phillip FrancoLindsey Dajai Wahlert, MS, RD, LDN Pager: [phone] After Hours Pager: [phone]

## 2014-11-22 NOTE — Plan of Care (Signed)
Problem: Consults Goal: Depression Patient Education See Patient Education Module for education specifics.  Outcome: Completed/Met Date Met:  11/22/14 Nurse talked with pt about depression and medications.

## 2014-11-22 NOTE — [facility] Suicide Risk Assessment (Signed)
   Nursing information obtained from:    Demographic factors:    Current Mental Status:    Loss Factors:    Historical Factors:    Risk Reduction Factors:    Total Time spent with patient: 45 minutes  CLINICAL FACTORS:   Alcohol/Substance Abuse/Dependencies Medical Diagnoses and Treatments/Surgeries  Psychiatric Specialty Exam: Physical Exam  ROS  Blood pressure 127/58, pulse 96, temperature 98.9 F (37.2 C), temperature source Oral, resp. rate 16, height 6\' 2"  (1.88 m), weight 51.71 kg (114 lb).Body mass index is 14.63 kg/(m^2).  General Appearance: Casual  Eye Contact::  Good  Speech:  Clear and Coherent  Volume:  Normal  Mood:  Anxious and Depressed  Affect:  Labile  Thought Process:  Coherent  Orientation:  Full (Time, Place, and Person)  Thought Content:  WDL  Suicidal Thoughts:  Yes.  without intent/plan  Homicidal Thoughts:  No  Memory:  Immediate;   Fair Recent;   Fair Remote;   Fair  Judgement:  Impaired  Insight:  Lacking  Psychomotor Activity:  Normal  Concentration:  Fair  Recall:  [employer]Fair  Fund of Knowledge:Fair  Language: Good  Akathisia:  No  Handed:  Right  AIMS (if indicated):     Assets:  Communication Skills Desire for Improvement  Sleep:  Number of Hours: 4   Musculoskeletal: Strength & Muscle Tone: within normal limits Gait & Station: normal Patient leans: N/A  COGNITIVE FEATURES THAT CONTRIBUTE TO RISK:  Closed-mindedness Polarized thinking Thought constriction (tunnel vision)    SUICIDE RISK:   Severe:  Frequent, intense, and enduring suicidal ideation, specific plan, no subjective intent, but some objective markers of intent (i.e., choice of lethal method), the method is accessible, some limited preparatory behavior, evidence of impaired self-control, severe dysphoria/symptomatology, multiple risk factors present, and few if any protective factors, particularly a lack of social support.  PLAN OF CARE:Please see H&P.   I certify that  inpatient services furnished can reasonably be expected to improve the patient's condition.  Tracina Beaumont md 11/22/2014, 11:16 AM
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
## 2014-11-22 NOTE — [facility] Group Notes (Signed)
[facility] LCSW Group Therapy  11/22/2014 1:22 PM  Type of Therapy:  Group Therapy  Participation Level:  None  Participation Quality:  Drowsy  Affect:  Depressed  Cognitive:  Appropriate  Insight:  Limited  Engagement in Therapy:  Limited  Modes of Intervention:  Discussion, Education, Socialization and Support  Summary of Progress/Problems:Mental [employer] (MHA) speaker came to talk about his personal journey with substance abuse and mental illness. Group members were challenged to process ways by which to relate to the speaker. MHA speaker provided handouts and educational information pertaining to groups and services offered by the [facility]MHA. Phillip SalvoRaymond came to group but left before the end. He sat quietly and listened to the speaker.      [name],[name] 11/22/2014, 1:22 PM

## 2014-11-22 NOTE — Progress Notes (Addendum)
Patient stated he is SI to hit his head on wall, contracts for safety.  Denied HI.  Stated he does see his sister.  Voices of dad and sister "fussing at me".   Patient has been given imodium by night shift and another imodium 4 mg per MD order this morning for loose stools.   Patient he wants order for ensure.  Stated his goiter is bothering him this morning, will discuss with MD.  Swallowing issues.    D:  Patient's self inventory sheet, patient has poor sleep, no sleep medication last night.  Poor appetite, low energy level, poor concentration.  Rated depression, anxiety and hopelessness #10.  Stated he is experiencing withdrawals, tremors, diarrhea, cravings, agitation, runny nose, irritable.  SI to hit head on wall off/on, contracts for safety.  Denied HI.  Pain in neck, leg, arm, worst pain #8, pain medication not helping.  Goal is to work on himself.  Plans to pray and talk to staff.  No discharge plan. A:  Medications administered per MD orders.  Emotional support and encouragement given patient. R:  Denied HI.  Visual hallucinations of seeing his sister.  Hears dad's and sister's voices "fussing at me".
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
## 2014-11-22 NOTE — Consult Note (Signed)
Medical Consultation  Phillip Kemp WUJ:[id number] DOB: 09-Jul-1961 DOA: 11/21/2014 PCP: Willey BladeEAN, ERIC, MD   Requesting physician: Armandina StammerAgnes Nwoko, NP Date of consultation: 11/22/2014 Reason for consultation: Thyromegaly  Impression/Recommendations Thyromegaly with abnormal TSH/Exophthalmos -History and exam along with suppressed TSH suggest a diagnosis of Graves' disease -TSH <0.005 -Check free T4, total T3 -Chek thyrotropin receptor antibodies -Check antithyroid peroxidase antibodies -To be complete, thyroid ultrasound -If thyroid functions confirm hyperthyroidism, the patient may need to be started on a thioamide  -patient does not recall the name of the medicine, but states that he was previously treated for "overactive thyroid"  Polysubstance abuse -Including cocaine, alcohol, tobacco -Per primary service bipolar disorder/substance induced mood disorder -Per primary service Dysuria/pyuria -started on doxy by primary service -defer to primary service Severe protein calorie malnutrition -Continue Ensure   I will followup again tomorrow. Please contact me if I can be of assistance in the meanwhile. Thank you for this consultation.  Chief Complaint: thyromegaly  HPI:  53 year old male with a history of hyperthyroidism [facility] on 11/21/2014 after drinking and using drugs off and on for the past 10 days. The patient was seeking assistance for his depression, substance abuse, and a place to live as he was recently evicted from his home. The patient had feelings of hopelessness and worthlessness. The patient was using cocaine and drinking 2 x 40oz beer daily.  During evaluation, the patient was noted to have thyromegaly. As result, medicine consultation was obtained.  The patient is not a great historian, but states that he was previously diagnosed with "overactive thyroid", and he was started on some type of medication which she took twice a day.    unfortunately he does not recall the name of the medication.   he states that he last took the medication approximately 3-4 months ago and has not taken any since he ran out. He states that when he was taking the medication, his goiter actually improved in size. Since running out of the medication, he has noted insomnia, loose stools, and anxiety. He also endorses a 60 pound weight loss in the past year. He denies any fevers, chills, chest pain, shortest breath, nausea, vomiting, hematemesis, hematochezia, melena. HIV and RPR were negative.  he was also noted to have a suppressed TSH. Hemoglobin was 9.8 and microcytic. Urine drug screen showed cocaine and cannabis.   Review of Systems:  Constitutional:  No weight loss, night sweats, Fevers, chills, fatigue.  Head&Eyes: No headache.  No vision loss.   ENT:  No Difficulty swallowing,Tooth/dental problems,Sore throat,  No ear ache, post nasal drip,  Cardio-vascular:  No chest pain, Orthopnea, PND, swelling in lower extremities,  dizziness, palpitations  GI:  No heartburn, indigestion, abdominal pain, nausea, vomiting,  loss of appetite, hematochezia, melena Resp:  No shortness of breath with exertion or at rest. No excess mucus, no productive cough, No non-productive cough, No coughing up of blood.No change in color of mucus.No wheezing.No chest wall deformity  Skin:  no rash or lesions.  GU:  no change in color of urine, no urgency or frequency. No flank pain.  Musculoskeletal:   No decreased range of motion. No back pain.  Psych:  No change in mood or affect. No depression or anxiety. Neurologic: No headache, no dysesthesia, no focal weakness, no vision loss. No syncope   Past Medical History  Diagnosis Date  . Arthritis   . Substance abuse     Cocaine, marijuana, and alcohol  .  Suicidal ideation 09/09/2012  . Chronic blood loss anemia 09/09/2012  . Rectal bleeding 09/08/2012  . Acute alcoholic pancreatitis 09/08/2012  . Depression      Past Surgical History  Procedure Laterality Date  . Orthopedic surgery      Left foot surgery  . Left foot surgery    . Left cataract extraction     Social History:  reports that he has quit smoking. His smoking use included Cigarettes. He has a 2.5 pack-year smoking history. He quit smokeless tobacco use about 2 years ago. He reports that he drinks about 28.8 oz of alcohol per week. He reports that he uses illicit drugs (Cocaine and Marijuana).  Family History  Problem Relation Age of Onset  . Heart disease Mother   . Cancer Father     prostate    Allergies  Allergen Reactions  . Bee Venom Anaphylaxis     Prior to Admission medications   Medication Sig Start Date End Date Taking? Authorizing Provider  Iron-Vitamins (GERITOL PO) Take 1 tablet by mouth daily.   Yes Historical Provider, MD    Physical Exam: Filed Vitals:   11/21/14 2328 11/22/14 0607 11/22/14 0608 11/22/14 1025  BP: 116/40 127/58 124/67 127/58  Pulse: 89 96 105 96  Temp: 97.5 F (36.4 C) 98.9 F (37.2 C)    TempSrc: Oral Oral    Resp: 16 16    Height: 6\' 2"  (1.88 m)     Weight: 51.71 kg (114 lb)      General:  A&O x 3, NAD, nontoxic, pleasant/cooperative Head/Eye: No conjunctival hemorrhage, no icterus, [place]/AT, No nystagmus ENT:  No icterus,  No thrush, +thyromegaly, no pharyngeal exudate Neck:  No masses, no lymphadenpathy, no bruits CV:  RRR, no rub, no gallop, no S3 Lung:  CTAB, good air movement, no wheeze, no rhonchi Abdomen: soft/NT, +BS, nondistended, no peritoneal signs Ext: No cyanosis, No rashes, No petechiae, No lymphangitis, trace LE edema Neuro: CNII-XII intact, strength 4/5 in bilateral upper and lower extremities, no dysmetria  Labs on Admission:  Basic Metabolic Panel:  Recent Labs Lab 11/21/14 1347  NA 139  K 3.7  CL 103  CO2 20  GLUCOSE 94  BUN 16  CREATININE 0.45*  CALCIUM 9.5   Liver Function Tests:  Recent Labs Lab 11/21/14 1347  AST 28  ALT 25  ALKPHOS  219*  BILITOT 0.5  PROT 7.2  ALBUMIN 3.3*   No results for input(s): LIPASE, AMYLASE in the last 168 hours. No results for input(s): AMMONIA in the last 168 hours. CBC:  Recent Labs Lab 11/21/14 1347  WBC 6.9  HGB 9.8*  HCT 30.4*  MCV 78.6  PLT 131*   Cardiac Enzymes:  Recent Labs Lab 11/21/14 1346  TROPONINI <0.30   BNP: Invalid input(s): POCBNP CBG: No results for input(s): GLUCAP in the last 168 hours.  Radiological Exams on Admission: Ct Orbitss W/o Cm  11/21/2014   CLINICAL DATA:  53 year old male with orbital injury. Initial encounter.  EXAM: CT ORBITS WITHOUT CONTRAST  TECHNIQUE: Multidetector CT imaging of the orbits was performed following the standard protocol without intravenous contrast.  COMPARISON:  11/16/2012.  FINDINGS: Orbital structures are intact.  Visualized intracranial structures unremarkable.  Dental disease with minimal mucosal thickening inferior aspect maxillary sinuses bilaterally.  IMPRESSION: Orbital structures are intact.  Dental disease with minimal mucosal thickening inferior aspect maxillary sinuses bilaterally.   Electronically Signed   By: Bridgett Larsson M.D.   On: 11/21/2014 15:39  EKG: Independently reviewed. Sinus rhythm, T-wave inversion V4-V6  Time spent: 60 min  Vinessa Macconnell [facility] Pager [phone]  If 7PM-7AM, please contact night-coverage [email or web address] Password [email or web address] 11/22/2014, 2:40 PM

## 2014-11-22 NOTE — Progress Notes (Signed)
Adult Psychoeducational Group Note  Date:  11/22/2014 Time:  10:28 PM  Group Topic/Focus:  Wrap-Up Group:   The focus of this group is to help patients review their daily goal of treatment and discuss progress on daily workbooks.  Participation Level:  Active  Participation Quality:  Appropriate  Affect:  Appropriate  Cognitive:  Appropriate  Insight: Appropriate  Engagement in Group:  Engaged  Modes of Intervention:  Discussion  Additional Comments:   Pt attended wrap up group this evening. Pt participate in group. Pt shared with peers about day and pt goals.    [name] A 11/22/2014, 10:28 PM 
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
## 2014-11-22 NOTE — Progress Notes (Signed)
NURSE CALLED [facility] AND APPT SET UP FOR PATIENT TO HAVE US SOFT TISSUE HEAD/NECK ON THURSDAY MORNING  10:45 A.M.   NURSE CALLED [facility] ([facility]) PHONE [phone] TO PICK UP PATIENT Thursday MORNING TO HAVE [facility] AT 10:45 A.M.  [facility] AT [facility] REQUESTED THAT NURSE CALL IN THE MORNING AGAIN TO HAVE PAT[facility] TO [facility].     Quintella ReichertBeverly Fallon Haecker RN 11/22/2014

## 2014-11-23 ENCOUNTER — Emergency Department (HOSPITAL_COMMUNITY)
Admission: EM | Admit: 2014-11-23 | Discharge: 2014-11-23 | Discharge status: Absent without leave | Payer: Medicare Other | Source: Home / Self Care

## 2014-11-23 ENCOUNTER — Ambulatory Visit (HOSPITAL_COMMUNITY): Payer: Medicare Other | Attending: Internal Medicine

## 2014-11-23 DIAGNOSIS — F332 Major depressive disorder, recurrent severe without psychotic features: Secondary | ICD-10-CM

## 2014-11-23 DIAGNOSIS — F1490 Cocaine use, unspecified, uncomplicated: Secondary | ICD-10-CM

## 2014-11-23 DIAGNOSIS — F10239 Alcohol dependence with withdrawal, unspecified: Secondary | ICD-10-CM

## 2014-11-23 DIAGNOSIS — E079 Disorder of thyroid, unspecified: Secondary | ICD-10-CM

## 2014-11-23 DIAGNOSIS — E049 Nontoxic goiter, unspecified: Secondary | ICD-10-CM | POA: Diagnosis not present

## 2014-11-23 DIAGNOSIS — F1290 Cannabis use, unspecified, uncomplicated: Secondary | ICD-10-CM

## 2014-11-23 LAB — T4, FREE: FREE T4: 7.55 ng/dL — AB (ref 0.80–1.80)

## 2014-11-23 LAB — THYROID PEROXIDASE ANTIBODY: THYROID PEROXIDASE ANTIBODY: 206 [IU]/mL — AB (ref <9)

## 2014-11-23 LAB — GLUCOSE, CAPILLARY: Glucose-Capillary: 129 mg/dL — ABNORMAL HIGH (ref 70–99)

## 2014-11-23 MED ORDER — CHLORDIAZEPOXIDE HCL 25 MG PO CAPS: 25.0000 mg | ORAL_CAPSULE | Freq: Every day | ORAL | Status: DC

## 2014-11-23 MED ORDER — VITAMIN B-1 100 MG PO TABS
100.0000 mg | ORAL_TABLET | Freq: Every day | ORAL | Status: DC
  Administered 2014-11-24 – 2014-11-25 (×2): 100 mg via ORAL
  Filled 2014-11-23 (×5): qty 1

## 2014-11-23 MED ORDER — METHIMAZOLE 5 MG PO TABS
5.0000 mg | ORAL_TABLET | Freq: Three times a day (TID) | ORAL | Status: DC
Start: 2014-11-23 — End: 2014-11-25
  Administered 2014-11-23 – 2014-11-25 (×6): 5 mg via ORAL
  Filled 2014-11-23 (×11): qty 1

## 2014-11-23 MED ORDER — INFLUENZA VAC SPLIT QUAD 0.5 ML IM SUSY
0.5000 mL | PREFILLED_SYRINGE | INTRAMUSCULAR | Status: AC
  Administered 2014-11-24: 0.5 mL via INTRAMUSCULAR
  Filled 2014-11-23: qty 0.5

## 2014-11-23 MED ORDER — PROPRANOLOL HCL 40 MG PO TABS
40.0000 mg | ORAL_TABLET | Freq: Two times a day (BID) | ORAL | Status: DC
  Administered 2014-11-23 – 2014-11-25 (×3): 40 mg via ORAL
  Filled 2014-11-23 (×2): qty 1
  Filled 2014-11-23: qty 4
  Filled 2014-11-23 (×6): qty 1

## 2014-11-23 MED ORDER — CHLORDIAZEPOXIDE HCL 25 MG PO CAPS
25.0000 mg | ORAL_CAPSULE | Freq: Four times a day (QID) | ORAL | Status: AC
  Administered 2014-11-23: 25 mg via ORAL
  Filled 2014-11-23 (×2): qty 1

## 2014-11-23 MED ORDER — TRAZODONE HCL 50 MG PO TABS
50.0000 mg | ORAL_TABLET | Freq: Every evening | ORAL | Status: DC | PRN
  Administered 2014-11-24: 50 mg via ORAL
  Filled 2014-11-23: qty 1

## 2014-11-23 MED ORDER — CHLORDIAZEPOXIDE HCL 25 MG PO CAPS
25.0000 mg | ORAL_CAPSULE | Freq: Three times a day (TID) | ORAL | Status: AC
  Administered 2014-11-24 (×3): 25 mg via ORAL
  Filled 2014-11-23 (×2): qty 1

## 2014-11-23 MED ORDER — CHLORDIAZEPOXIDE HCL 25 MG PO CAPS: 25.0000 mg | ORAL_CAPSULE | Freq: Four times a day (QID) | ORAL | Status: DC | PRN

## 2014-11-23 MED ORDER — CHLORDIAZEPOXIDE HCL 25 MG PO CAPS
25.0000 mg | ORAL_CAPSULE | ORAL | Status: DC
  Administered 2014-11-25: 25 mg via ORAL
  Filled 2014-11-23: qty 1

## 2014-11-23 MED ORDER — MIRTAZAPINE 30 MG PO TABS
30.0000 mg | ORAL_TABLET | Freq: Every day | ORAL | Status: DC
  Administered 2014-11-23: 30 mg via ORAL
  Filled 2014-11-23 (×3): qty 1

## 2014-11-23 MED ORDER — ONDANSETRON 4 MG PO TBDP
4.0000 mg | ORAL_TABLET | Freq: Four times a day (QID) | ORAL | Status: DC | PRN
  Administered 2014-11-23: 4 mg via ORAL
  Filled 2014-11-23: qty 1

## 2014-11-23 MED ORDER — THIAMINE HCL 100 MG/ML IJ SOLN
100.0000 mg | Freq: Once | INTRAMUSCULAR | Status: AC
  Administered 2014-11-23: 100 mg via INTRAMUSCULAR
  Filled 2014-11-23: qty 2

## 2014-11-23 NOTE — [facility] Suicide Risk Assessment (Signed)
[facility] INPATIENT:  Family/Significant Other Suicide Prevention Education  Suicide Prevention Education:  Patient Refusal for Family/Significant Other Suicide Prevention Education: The patient Phillip Kemp has refused to provide written consent for family/significant other to be provided Family/Significant Other Suicide Prevention Education during admission and/or prior to discharge.  Physician notified. Patient gave Phillip Kemp (sister), however the number he provided is not a working number.   [name],[name] 11/23/2014, 4:14 PM

## 2014-11-23 NOTE — Progress Notes (Signed)
Psychoeducational Group Note  Date:  11/23/2014 Time:  1120  Group Topic/Focus:  Building Self Esteem:   The Focus of this group is helping patients become aware of the effects of self-esteem on their lives, the things they and others do that enhance or undermine their self-esteem, seeing the relationship between their level of self-esteem and the choices they make and learning ways to enhance self-esteem.  Participation Level: Did Not Attend  Participation Quality:  Not Applicable  Affect:  Not Applicable  Cognitive:  Not Applicable  Insight:  Not Applicable  Engagement in Group: Not Applicable  Additional Comments:  [facility] medical procedure done.  Yaris Ferrell E 11/23/2014, 11:24 AM

## 2014-11-23 NOTE — Progress Notes (Signed)
Thyroid function tests consistent with hyperthyroidism.  Pt started on propranolol 60 mg a day and methimazole 5 mg 3 times a day. Will need endocrinology referral upon discharge.   Will see pt in AM  MAGICK-MYERS, ISKRA, MD  [facility] Pager [phone]  If 7PM-7AM, please contact night-coverage [email or web address] Password [email or web address]  

## 2014-11-23 NOTE — Progress Notes (Deleted)
Pt attended karaoke group this evening.  

## 2014-11-23 NOTE — Progress Notes (Signed)
Pt's BP 109/52, pulse 92 at 1700. Scheduled medications administered by RN, Elease HashimotoPatricia. Medication education reviewed with pt, pt encouraged to increase fluids after medication administration per Alisa GraffPatricia RN.

## 2014-11-23 NOTE — ED Notes (Signed)
Pt had scheduled appt in radiology.  Dismiss to scheduled appt

## 2014-11-23 NOTE — Progress Notes (Signed)
D: Patient is alert and oriented. Pt's mood and affect is sad and depressed. Pt complains of diarrhea, nausea and anxiety this am. Pt complains of anxiety and nightmares that he believes is d/t withdrawal, pt reports drinking a "fifth of liquor and beer" daily. Pt not on protocol at this time. Pt's pulse has been elevated this am (See doc flowsheet-vitals). Pt's BP at noon 103/50, pulse 96. Pt reports he did not eat breakfast. BP recheck at 1300 124/60, pulse 90. Pt denies SI/HI and AVH at this time. A: Provider notified of pt's withdrawal concerns and increased pulse, orders placed, and acknowledged. Will reassess vitals frequently. [facility] 1025 and returned at 1127 (See additional note). Pt given fluids at 1200, will reassess BP, pt encouraged to eat lunch. 1200 dose of librium not given per providers verbal order d/t pts BP. PRN medications administered per providers orders for diarrhea, nausea, and anxiety (See MAR). Scheduled medications administered per providers orders (See MAR). 15 minute checks completed per protocol for pt safety. R: Pt cooperative and receptive to nursing interventions.

## 2014-11-23 NOTE — Progress Notes (Signed)
Pt able to consume apple sauce and ensure with no difficulties observed. Pt verbalized no difficulties as well. Pt was able to take his 2000 medications with water this evening. Pt reported the need to cough up his fish sandwich during lunch due to difficulty swallowing. He was able to consume his carrots and peas without any problems. Pt was dx with hyperthyroidism after a US and labs were performed earlier today.

## 2014-11-23 NOTE — Progress Notes (Signed)
D: Pt presents flat in affect and depressed in mood. Pt had minimal interaction with others within the milieu. Pt was present for group. Pt is currently denying any SI/HI/AVH. Pt is hoping to remain free of any nightmares this evening (previous occurrences on previous nights). Pt was  interested in knowing his current medication regimen for sleep. Pt informed of the start of his new order for Mirtazapine for sleep this evening.   A: Writer administered scheduled medications to pt, per MD orders. Continued support and availability as needed was extended to this pt. Staff continue to monitor pt with q6715min checks.  R: No adverse drug reactions noted. Pt receptive to treatment. Pt remains safe at this time.

## 2014-11-23 NOTE — Progress Notes (Signed)
Pt did not attend karaoke this evening.  

## 2014-11-23 NOTE — Progress Notes (Addendum)
[facility] Progress Note  11/23/2014 11:27 AM Phillip Kemp  MRN:  [id number] Subjective: Patient states "I threw up this AM and I am having tremors" Objective: Patient seen this AM. Patient reports feeling miserable this AM ,reports vomiting ,brought out some fluid ,denies any nausea , no abdominal pain . Denies diarrhea. Patient reports abusing a lot of etoh and reports he could be withdrawing. He has visible tremors of BL hands. Patient also reports feeling tired and depressed. Patient's BAL< 11 on admission. However since patient is giving history of severe alcohol abuse, and is currently showing withdrawal sx, will start him on Librium protocol. Patient continues to appear emaciated ,low energy and motivation as well as is withdrawn. Patient denies any SI/HI today but reports feeling very anxious.  Patient otherwise has no concerns. Patient has visible goiter as well as exophthalmos .Hospitalist to follow his thyroid disease. Patient to get US thyroid today.  Diagnosis:   DSM5: Primary Psychiatric Diagnosis: Major depressive disorder ,recurrent ,severe without psychosis   Secondary Psychiatric Diagnosis: Alcohol use disorder,severe Alcohol withdrawal without perceptual disturbances Cannabis use disorder ,severe Stimulant use disorder (cocaine type)   Non Psychiatric Diagnosis:  Total Time spent with patient: 45 minutes   ADL's:  Impaired  Sleep: Fair  Appetite:  Poor   Psychiatric Specialty Exam: Physical Exam  ROS  Blood pressure 118/56, pulse 116, temperature 98 F (36.7 C), temperature source Oral, resp. rate 17, height 6\' 2"  (1.88 m), weight 51.71 kg (114 lb).Body mass index is 14.63 kg/(m^2).  General Appearance: Disheveled  Eye SolicitorContact::  Fair  Speech:  Clear and Coherent  Volume:  Normal  Mood:  Anxious and Depressed  Affect:  Restricted  Thought Process:  Coherent  Orientation:  Full (Time, Place, and Person)  Thought Content:  Rumination  Suicidal  Thoughts:  No  Homicidal Thoughts:  No  Memory:  Immediate;   Fair Recent;   Fair Remote;   Fair  Judgement:  Impaired  Insight:  Lacking  Psychomotor Activity:  Tremor  Concentration:  Fair  Recall:  FiservFair  Fund of Knowledge:Fair  Language: Good  Akathisia:  No  Handed:  Right  AIMS (if indicated):     Assets:  Communication Skills  Sleep:  Number of Hours: 6.75   Musculoskeletal: Strength & Muscle Tone: within normal limits Gait & Station: normal Patient leans: N/A  Current Medications: Current Facility-Administered Medications  Medication Dose Route Frequency Provider Last Rate Last Dose  . acetaminophen (TYLENOL) tablet 650 mg  650 mg Oral Q6H PRN Kerry HoughSpencer E Simon, PA-C   650 mg at 11/22/14 0801  . alum & mag hydroxide-simeth (MAALOX/MYLANTA) 200-200-20 MG/5ML suspension 30 mL  30 mL Oral Q4H PRN Kerry HoughSpencer E Simon, PA-C      . aspirin chewable tablet 324 mg  324 mg Oral Daily Kerry HoughSpencer E Simon, PA-C   324 mg at 11/23/14 [id number]  . chlordiazePOXIDE (LIBRIUM) capsule 25 mg  25 mg Oral Q6H PRN Jomarie LongsSaramma Alajiah Dutkiewicz, MD      . chlordiazePOXIDE (LIBRIUM) capsule 25 mg  25 mg Oral QID Jomarie LongsSaramma Kadeem Hyle, MD       Followed by  . [START ON 11/24/2014] chlordiazePOXIDE (LIBRIUM) capsule 25 mg  25 mg Oral TID Jomarie LongsSaramma Amontae Ng, MD       Followed by  . [START ON 11/25/2014] chlordiazePOXIDE (LIBRIUM) capsule 25 mg  25 mg Oral BH-qamhs Jomarie LongsSaramma Tameyah Koch, MD       Followed by  . [START ON 11/26/2014] chlordiazePOXIDE (LIBRIUM) capsule 25  mg  25 mg Oral Daily Alvena Kiernan, MD      . doxycycline (VIBRA-TABS) tablet 100 mg  100 mg Oral Q12H Kerry Hough, PA-C   100 mg at 11/23/14 0836  . feeding supplement (ENSURE COMPLETE) (ENSURE COMPLETE) liquid 237 mL  237 mL Oral TID BM Tilda Franco, RD   237 mL at 11/23/14 0927  . hydrOXYzine (ATARAX/VISTARIL) tablet 25 mg  25 mg Oral Q6H PRN Kerry Hough, PA-C   25 mg at 11/22/14 0801  . [START ON 11/24/2014] Influenza vac split quadrivalent PF (FLUARIX) injection  0.5 mL  0.5 mL Intramuscular Tomorrow-1000 Geoff Dacanay, MD      . loperamide (IMODIUM) capsule 4 mg  4 mg Oral PRN Kerry Hough, PA-C   4 mg at 11/23/14 1022  . magnesium hydroxide (MILK OF MAGNESIA) suspension 30 mL  30 mL Oral Daily PRN Kerry Hough, PA-C      . mirtazapine (REMERON) tablet 15 mg  15 mg Oral QHS Sanjuana Kava, NP   15 mg at 11/22/14 2154  . multivitamin with minerals tablet 1 tablet  1 tablet Oral Daily Tilda Franco, RD   1 tablet at 11/23/14 [phone]  . ondansetron (ZOFRAN-ODT) disintegrating tablet 4 mg  4 mg Oral Q6H PRN Jomarie Longs, MD   4 mg at 11/23/14 1022  . thiamine (B-1) injection 100 mg  100 mg Intramuscular Once Jomarie Longs, MD   Stopped at 11/23/14 1028  . [START ON 11/24/2014] thiamine (VITAMIN B-1) tablet 100 mg  100 mg Oral Daily Chella Chapdelaine, MD      . tobramycin (TOBREX) 0.3 % ophthalmic solution 1 drop  1 drop Left Eye Q4H Kerry Hough, PA-C   1 drop at 11/23/14 [phone]  . traZODone (DESYREL) tablet 50 mg  50 mg Oral QHS,MR X 1 Kerry Hough, PA-C   50 mg at 11/22/14 2152    Lab Results:  Results for orders placed or performed during the hospital encounter of 11/21/14 (from the past 48 hour(s))  Lipid panel     Status: None   Collection Time: 11/22/14  6:18 AM  Result Value Ref Range   Cholesterol 104 0 - 200 mg/dL   Triglycerides 63 <914 mg/dL   HDL 61 >78 mg/dL   Total CHOL/HDL Ratio 1.7 RATIO   VLDL 13 0 - 40 mg/dL   LDL Cholesterol 30 0 - 99 mg/dL    Comment:        Total Cholesterol/HDL:CHD Risk Coronary Heart Disease Risk Table                     Men   Women  1/2 Average Risk   3.4   3.3  Average Risk       5.0   4.4  2 X Average Risk   9.6   7.1  3 X Average Risk  23.4   11.0        Use the calculated Patient Ratio above and the CHD Risk Table to determine the patient's CHD Risk.        ATP III CLASSIFICATION (LDL):  <100     mg/dL   Optimal  295-621  mg/dL   Near or Above                    Optimal  130-159  mg/dL    Borderline  308-657  mg/dL   High  >846     mg/dL  Very High Performed at [facility]   Hemoglobin A1c     Status: None   Collection Time: 11/22/14  6:18 AM  Result Value Ref Range   Hgb A1c MFr Bld 5.0 <5.7 %    Comment: (NOTE)                                                                       According to the ADA Clinical Practice Recommendations for 2011, when HbA1c is used as a screening test:  >=6.5%   Diagnostic of Diabetes Mellitus           (if abnormal result is confirmed) 5.7-6.4%   Increased risk of developing Diabetes Mellitus References:Diagnosis and Classification of Diabetes Mellitus,Diabetes Care,2011,34(Suppl 1):S62-S69 and Standards of Medical Care in         Diabetes - 2011,Diabetes Care,2011,34 (Suppl 1):S11-S61.    Mean Plasma Glucose 97 <117 mg/dL    Comment: Performed at [facility]  TSH     Status: Abnormal   Collection Time: 11/22/14  6:18 AM  Result Value Ref Range   TSH <0.005 (L) 0.350 - 4.500 uIU/mL    Comment: Performed at [facility]  T4, free     Status: Abnormal   Collection Time: 11/22/14  7:50 PM  Result Value Ref Range   Free T4 7.55 (H) 0.80 - 1.80 ng/dL    Comment: Performed at [facility]  Thyroid peroxidase antibody     Status: Abnormal   Collection Time: 11/22/14  7:50 PM  Result Value Ref Range   Thyroid Peroxidase Antibody 206 (H) <9 IU/mL    Comment: (NOTE) **Please note change in methodology. If re-baselining is needed, for patients who are being serially tested, please call customer service, within 3 days of collection, to request to add on the appropriate re-baselining test code for the previous methodology, at no charge.** Performed at [facility]   Glucose, capillary     Status: Abnormal   Collection Time: 11/23/14  6:53 AM  Result Value Ref Range   Glucose-Capillary 129 (H) 70 - 99 mg/dL    Physical Findings: AIMS: Facial and Oral Movements Muscles of Facial Expression:  None, normal Lips and Perioral Area: None, normal Jaw: None, normal Tongue: None, normal,Extremity Movements Upper (arms, wrists, hands, fingers): None, normal Lower (legs, knees, ankles, toes): None, normal, Trunk Movements Neck, shoulders, hips: None, normal, Overall Severity Severity of abnormal movements (highest score from questions above): None, normal Incapacitation due to abnormal movements: None, normal Patient's awareness of abnormal movements (rate only patient's report): No Awareness, Dental Status Current problems with teeth and/or dentures?: Yes Does patient usually wear dentures?: No  CIWA:  CIWA-Ar Total: 1 COWS:  COWS Total Score: 2  Treatment Plan Summary: Daily contact with patient to assess and evaluate symptoms and progress in treatment Medication management  Plan:  Will start patient on Librium protocol since patient gives hx of heavy alcohol use and has been showing symptoms of withdrawal. Will increase the dose of Remeron to 30 mg po qhs for depression ,anxiety as well as sleep. Will change Trazodone to prn. Hospitalist to follow his thyroid disorder. Patient has severe goiter ,with exophthalmos as well as difficulty talking as well as  swallowing due to pressure effect. Patient encouraged to attend group activities. CSW will work on disposition. Patient requests placement. CSW aware.   Medical Decision Making Problem Points:  Established problem, worsening (2), Review of last therapy session (1) and Review of psycho-social stressors (1) Data Points:  Discuss tests with performing physician (1) Review or order clinical lab tests (1) Review or order medicine tests (1) Review and summation of old records (2) Review of medication regiment & side effects (2) Review of new medications or change in dosage (2)  I certify that inpatient services furnished can reasonably be expected to improve the patient's condition.   Iren Whipp  MD  11/23/2014, 11:27  AM

## 2014-11-23 NOTE — Progress Notes (Signed)
[facility] via [place] with MHT, Leeroy.

## 2014-11-23 NOTE — Progress Notes (Signed)
Pt reports feeling weak with an episode of dry heaving this evening this morning. Pt reports poor food intake over the past 24 hours. Pt received an ensure at bedtime. Pt's pulse was tachy per [employer]. A manual left radial pulse was obtained and resulted at 102. Pt CBG was 129. Pt reports feeling better and is now consuming his crackers and a cup of water. Tray to be brought back to pt.

## 2014-11-23 NOTE — [facility] Group Notes (Signed)
[facility] Group Notes:  (Nursing/MHT/Case Management/Adjunct)  Date:  11/23/2014  Time:  0900am  Type of Therapy:  Nurse Education  Participation Level:  Active  Participation Quality:  Appropriate and Attentive  Affect:  Blunted  Cognitive:  Alert and Appropriate  Insight:  Appropriate and Good  Engagement in Group:  Engaged  Modes of Intervention:  Education and Support  Summary of Progress/Problems: Patient responded appropriately and was engaged in group.  Lendell CapriceGuthrie, Wymon Swaney A 11/23/2014, 2:07 PM

## 2014-11-23 NOTE — Progress Notes (Signed)
Per MHT Francis DowseJoel, pt struggled eating and was coughing and choking during dinner. Pt offered apple sauce. Will report to oncoming RN for additional interventions.

## 2014-11-23 NOTE — Plan of Care (Signed)
Problem: Diagnosis: Increased Risk For Suicide Attempt Goal: LTG-Patient Will Report Absence of Withdrawal Symptoms LTG (by discharge): Patient will report absence of withdrawal symptoms.  Outcome: Not Progressing Patient still experiencing withdrawal symptoms, including shakiness, nausea/vomitting, and diarrhea. Goal: STG-Patient Will Attend All Groups On The Unit Outcome: Progressing Patient is attending groups and states "please let me know when the next group is, I want to go, I've got to go to those." Pt verbalizes the importance of attending groups for his care. Goal: STG-Patient Will Comply With Medication Regime Outcome: Progressing Patient complies with medication regimen today.  Problem: Ineffective individual coping Goal: STG: Patient will remain free from self harm Outcome: Progressing Patient remains free from self harm. 15 minute checks completed per protocol for pt safety.  Comments:  Alena BillsGuthrie, Hokulani Rogel A, RN

## 2014-11-23 NOTE — Progress Notes (Signed)
[facility]

## 2014-11-23 NOTE — [facility] Group Notes (Signed)
[facility] LCSW Group Therapy  11/23/2014 11:58 AM   Type of Therapy:  Group Therapy  Participation Level:  Active  Participation Quality:  Attentive  Affect:  Appropriate  Cognitive:  Appropriate  Insight:  Improving  Engagement in Therapy:  Engaged  Modes of Intervention:  Clarification, Education, Exploration and Socialization  Summary of Progress/Problems: Today's group focused on relapse prevention.  We defined the term, and then brainstormed on ways to prevent relapse.  "Relapse happened when I stopped doing the things that were keeping me sober."  Went on to explain that he had been going to mtgs regularly and even had a sponsor, but then stopped.  So stated he needs to restart that to regain sobriety and enter recovery. " I'm a kind hearted person who needs to take care of myslef.  And I need to do that by going into a structured environment."  Also talked about getting more information about the negative medical effects of drinking to help him maintain sobriety.  This on the heels of him getting thyroid check today.   Daryel Geraldorth, Fronia Depass B 11/23/2014 , 11:58 AM

## 2014-11-24 DIAGNOSIS — F1099 Alcohol use, unspecified with unspecified alcohol-induced disorder: Secondary | ICD-10-CM

## 2014-11-24 DIAGNOSIS — F10232 Alcohol dependence with withdrawal with perceptual disturbance: Secondary | ICD-10-CM

## 2014-11-24 MED ORDER — MIRTAZAPINE 45 MG PO TABS
45.0000 mg | ORAL_TABLET | Freq: Every day | ORAL | Status: DC
  Administered 2014-11-24: 45 mg via ORAL
  Filled 2014-11-24: qty 1
  Filled 2014-11-24: qty 3
  Filled 2014-11-24 (×2): qty 1

## 2014-11-24 NOTE — [facility] Group Notes (Signed)
[facility]   11/24/2014 10:29 AM  Participation Quality:  Engaged  Mood/Affect:  Depressed  Depression Rating:  8  Anxiety Rating:  5  Thoughts of Suicide:  No Will you contract for safety?   NA  Current AVH:  No  Plan for Discharge/Comments:  Feeling down today.  Has not been able to keep down food.  Poor sleep.  Worried about where he will go at d/c.  We addressed each concern individually, and by the end he was feeling a bit better as we came up with a plan for each.  Decided he still wants an [facility].  [facility].  Transportation Means:  bus  Supports: family  Kiribatiorth, Thereasa DistanceRodney B

## 2014-11-24 NOTE — [facility] Group Notes (Signed)
[facility] LCSW Group Therapy  11/24/2014  1:05 PM  Type of Therapy:  Group therapy  Participation Level:  Active  Participation Quality:  Attentive  Affect:  Flat  Cognitive:  Oriented  Insight:  Limited  Engagement in Therapy:  Limited  Modes of Intervention:  Discussion, Socialization  Summary of Progress/Problems:  Chaplain was here to lead a group on themes of hope and courage.  Talked about missing both his son and his grandchildren.  Took responsibility for his actions, saying he is at fault for not being together with them.  Phillip Kemp, Phillip Kemp B 11/24/2014 1:38 PM

## 2014-11-24 NOTE — Progress Notes (Signed)
Patient ID: Phillip Kemp, male   DOB: 19-Sep-1961, 53 y.o.   MRN: [id number] D. Patient presents with anxious mood, affect congruent. Phillip Kemp reports that he continues to feel weak, and dizzy. He also reports intermittent trouble swallowing at times during meals. He states '' I just hope they can get this thyroid thing fixed for me. I thought it was the drugs and alcohol doing this to me but I guess not. '' Patient has been interactive on the unit, attending programming. He was encouraged to drink po fluids and provided gatorade. He denies any withdrawal symptoms this morning. Noted low B/P this am and due to patients complaints of dizziness held propranolol. Discussed above with Dr. Elna Kemp. Medications given as ordered. Support and encouragement provided. R. Pt is safe. Will continue to monitor q 15 minutes for safety.

## 2014-11-24 NOTE — Progress Notes (Addendum)
D: Pt presents anxious in affect and mood. Pt verbalizes that he was worried that the swelling in his throat was a cancerous tumor. Pt feels relief in knowing the direct cause. Pt continues to present hypotensive  this evening. On-call extender contacted and ordered for his Librium to not be given. Writer was also advised to make a highlighted notation about pt's BP and the cautious administration of Inderal. Pt encouraged to continue increased fluid intake. Pt provided with Gatorade and juice. Pt is currently negative for any SI/HI/AVH. A: Writer administered scheduled medications to pt, per MD orders. Continued support and availability as needed was extended to this pt. Staff continue to monitor pt with q8915min checks.  R: No adverse drug reactions noted. Pt receptive to treatment. Pt remains safe at this time.

## 2014-11-24 NOTE — Progress Notes (Signed)
Adult Psychoeducational Group Note  Date:  11/24/2014 Time:  9:24 PM  Group Topic/Focus:  Wrap-Up Group:   The focus of this group is to help patients review their daily goal of treatment and discuss progress on daily workbooks.  Participation Level:  Active  Participation Quality:  Appropriate  Affect:  Appropriate  Cognitive:  Appropriate  Insight: Appropriate  Engagement in Group:  Engaged  Modes of Intervention:  Discussion  Additional Comments:  The patient expressed that he used group to stay positive about his situation.The patient also said that he was worried about his health.  Octavio Mannshigpen, Livian Vanderbeck Lee 11/24/2014, 9:24 PM

## 2014-11-24 NOTE — Progress Notes (Signed)
Patient met lying in bed. Appear depressed but cheerful upon approach. Cooperative and pleasant. Moves around safely with the wheelchair. Patient reports diarrhea. Imodium 54m given and was effective. Received all his due medications. Denies pain, SI, AH/VH. Resting at this time. Will continue to monitor patient.

## 2014-11-24 NOTE — Progress Notes (Signed)
[facility]  11/24/2014 4:38 PM Gwendalyn EgeRaymond M Marques  MRN:  [id number] Subjective: Patient states "I am a bit better.' Objective: Patient seen this AM. Patient reports feeling improvement ,but has multiple somatic complaints . Reports feeling dizzy . Patient per staff is hypotensive. Also threw up yesterday after lunch. Patient also had an episode when he could not breathe after coming back from the cafeteria. Patient to be seen by hospitalist. Patient to be provided with wheel chair.   Patient continues to have tremors as well as nausea. Continue Librium. Patient denies any SI/HI today but reports feeling very anxious.  Patient has visible goiter as well as exophthalmos .Hospitalist to follow his thyroid disease. Patient to get US thyroid today.  Diagnosis:   DSM5: Primary Psychiatric Diagnosis: Major depressive disorder ,recurrent ,severe without psychosis   Secondary Psychiatric Diagnosis: Alcohol use disorder,severe Alcohol withdrawal without perceptual disturbances Cannabis use disorder ,severe Stimulant use disorder (cocaine type)   Non Psychiatric Diagnosis:  Total Time spent with patient: 45 minutes   ADL's:  Impaired  Sleep: Fair  Appetite:  Poor   Psychiatric Specialty Exam: Physical Exam  ROS  Blood pressure 112/52, pulse 92, temperature 98 F (36.7 C), temperature source Oral, resp. rate 20, height 6\' 2"  (1.88 m), weight 51.71 kg (114 lb), SpO2 100 %.Body mass index is 14.63 kg/(m^2).  General Appearance: Fairly Groomed  Patent attorneyye Contact::  Fair  Speech:  Clear and Coherent  Volume:  Normal  Mood:  Anxious and Depressed  Affect:  Restricted  Thought Process:  Coherent  Orientation:  Full (Time, Place, and Person)  Thought Content:  Rumination  Suicidal Thoughts:  No  Homicidal Thoughts:  No  Memory:  Immediate;   Fair Recent;   Fair Remote;   Fair  Judgement:  Impaired  Insight:  Lacking  Psychomotor Activity:  Tremor  Concentration:  Fair   Recall:  FiservFair  Fund of Knowledge:Fair  Language: Good  Akathisia:  No  Handed:  Right  AIMS (if indicated):     Assets:  Communication Skills  Sleep:  Number of Hours: 6.75   Musculoskeletal: Strength & Muscle Tone: within normal limits Gait & Station: normal Patient leans: N/A  Current Medications: Current Facility-Administered Medications  Medication Dose Route Frequency Provider Last Rate Last Dose  . acetaminophen (TYLENOL) tablet 650 mg  650 mg Oral Q6H PRN Kerry HoughSpencer E Simon, PA-C   650 mg at 11/22/14 0801  . alum & mag hydroxide-simeth (MAALOX/MYLANTA) 200-200-20 MG/5ML suspension 30 mL  30 mL Oral Q4H PRN Kerry HoughSpencer E Simon, PA-C      . aspirin chewable tablet 324 mg  324 mg Oral Daily Kerry HoughSpencer E Simon, PA-C   324 mg at 11/24/14 0849  . chlordiazePOXIDE (LIBRIUM) capsule 25 mg  25 mg Oral Q6H PRN Jomarie LongsSaramma Lavonte Palos, MD      . Melene Muller[START ON 11/25/2014] chlordiazePOXIDE (LIBRIUM) capsule 25 mg  25 mg Oral BH-qamhs Jomarie LongsSaramma Julene Rahn, MD       Followed by  . [START ON 11/26/2014] chlordiazePOXIDE (LIBRIUM) capsule 25 mg  25 mg Oral Daily Rakwon Letourneau, MD      . doxycycline (VIBRA-TABS) tablet 100 mg  100 mg Oral Q12H Kerry HoughSpencer E Simon, PA-C   100 mg at 11/24/14 0848  . feeding supplement (ENSURE COMPLETE) (ENSURE COMPLETE) liquid 237 mL  237 mL Oral TID BM Tilda FrancoLindsey Baker, RD   237 mL at 11/24/14 1308  . hydrOXYzine (ATARAX/VISTARIL) tablet 25 mg  25 mg Oral Q6H PRN Mena GoesSpencer E  Simon, PA-C   25 mg at 11/22/14 0801  . loperamide (IMODIUM) capsule 4 mg  4 mg Oral PRN Kerry HoughSpencer E Simon, PA-C   4 mg at 11/23/14 1022  . magnesium hydroxide (MILK OF MAGNESIA) suspension 30 mL  30 mL Oral Daily PRN Kerry HoughSpencer E Simon, PA-C   30 mL at 11/24/14 1618  . methimazole (TAPAZOLE) tablet 5 mg  5 mg Oral TID Dorothea OgleIskra M Myers, MD   5 mg at 11/24/14 1620  . mirtazapine (REMERON) tablet 45 mg  45 mg Oral QHS Jomarie LongsSaramma Irys Nigh, MD      . multivitamin with minerals tablet 1 tablet  1 tablet Oral Daily Tilda FrancoLindsey Baker, RD   1 tablet at  11/24/14 0848  . ondansetron (ZOFRAN-ODT) disintegrating tablet 4 mg  4 mg Oral Q6H PRN Jomarie LongsSaramma Solina Heron, MD   4 mg at 11/23/14 1022  . propranolol (INDERAL) tablet 40 mg  40 mg Oral BID Dorothea OgleIskra M Myers, MD   40 mg at 11/24/14 1620  . thiamine (VITAMIN B-1) tablet 100 mg  100 mg Oral Daily Jomarie LongsSaramma Jonpaul Lumm, MD   100 mg at 11/24/14 0851  . tobramycin (TOBREX) 0.3 % ophthalmic solution 1 drop  1 drop Left Eye Q4H Kerry HoughSpencer E Simon, PA-C   1 drop at 11/24/14 1622  . traZODone (DESYREL) tablet 50 mg  50 mg Oral QHS PRN Jomarie LongsSaramma Emery Binz, MD        Lab Results:  Results for orders placed or performed during the hospital encounter of 11/21/14 (from the past 48 hour(s))  T4, free     Status: Abnormal   Collection Time: 11/22/14  7:50 PM  Result Value Ref Range   Free T4 7.55 (H) 0.80 - 1.80 ng/dL    Comment: Performed at [facility]  Thyroid peroxidase antibody     Status: Abnormal   Collection Time: 11/22/14  7:50 PM  Result Value Ref Range   Thyroid Peroxidase Antibody 206 (H) <9 IU/mL    Comment: (NOTE) **Please note change in methodology. If re-baselining is needed, for patients who are being serially tested, please call customer service, within 3 days of collection, to request to add on the appropriate re-baselining test code for the previous methodology, at no charge.** Performed at [facility]   Glucose, capillary     Status: Abnormal   Collection Time: 11/23/14  6:53 AM  Result Value Ref Range   Glucose-Capillary 129 (H) 70 - 99 mg/dL    Physical Findings: AIMS: Facial and Oral Movements Muscles of Facial Expression: None, normal Lips and Perioral Area: None, normal Jaw: None, normal Tongue: None, normal,Extremity Movements Upper (arms, wrists, hands, fingers): None, normal Lower (legs, knees, ankles, toes): None, normal, Trunk Movements Neck, shoulders, hips: None, normal, Overall Severity Severity of abnormal movements (highest score from questions above): None,  normal Incapacitation due to abnormal movements: None, normal Patient's awareness of abnormal movements (rate only patient's report): No Awareness, Dental Status Current problems with teeth and/or dentures?: Yes Does patient usually wear dentures?: No  CIWA:  CIWA-Ar Total: 3 COWS:  COWS Total Score: 2  Treatment Plan Summary: Daily contact with patient to assess and evaluate symptoms and progress in treatment Medication management  Plan:  Will continue patient on Librium protocol since patient gives hx of heavy alcohol use and has been showing symptoms of withdrawal. Will increase Remeron to 45  mg po qhs for depression ,anxiety as well as sleep. Will change Trazodone to prn. Hospitalist to follow his thyroid  disorder. Patient has severe goiter ,with exophthalmos as well as difficulty talking as well as swallowing due to pressure effect.Discussed patient having hypotensive episodes with hospitalist -they will follow. Patient encouraged to attend group activities. CSW will work on disposition. Patient requests placement. CSW aware.   Medical Decision Making Problem Points:  Established problem, worsening (2), Review of last therapy session (1) and Review of psycho-social stressors (1) Data Points:  Discuss tests with performing physician (1) Review or order clinical lab tests (1) Review or order medicine tests (1) Review and summation of old records (2) Review of medication regiment & side effects (2) Review of new medications or change in dosage (2)  I certify that inpatient services furnished can reasonably be expected to improve the patient's condition.   Amoy Steeves  MD  11/24/2014, 4:38 PM

## 2014-11-24 NOTE — Progress Notes (Signed)
Patient ID: Phillip Kemp, male   DOB: 10-12-1961, 53 y.o.   MRN: [id number] Pt placed on 1.1 monitoring at 1311 due to patients reports of dizziness, SOB upon walking and HIGH FALL RISK for safety. Pt was observed and staff assessed pt need. Patient was given wheelchair and determined able to care for self safely without use of 1.1. Sitter. MD order received to discontinue 1.1. At 1447. Pt has been visible in the milieu using wheel chair. Pt in no acute distress at this time. Will continue to monitor q 15 minutes for safety.

## 2014-11-25 ENCOUNTER — Inpatient Hospital Stay (HOSPITAL_COMMUNITY): Payer: Medicare Other

## 2014-11-25 ENCOUNTER — Encounter (HOSPITAL_COMMUNITY): Payer: Self-pay | Admitting: *Deleted

## 2014-11-25 ENCOUNTER — Inpatient Hospital Stay (HOSPITAL_COMMUNITY)
Admission: AD | Admit: 2014-11-25 | Discharge: 2014-11-29 | DRG: 643 | Disposition: A | Discharge status: Home / Self Care | Payer: Medicare Other | Source: Ambulatory Visit | Attending: Internal Medicine | Admitting: Internal Medicine

## 2014-11-25 DIAGNOSIS — F314 Bipolar disorder, current episode depressed, severe, without psychotic features: Secondary | ICD-10-CM

## 2014-11-25 DIAGNOSIS — E0500 Thyrotoxicosis with diffuse goiter without thyrotoxic crisis or storm: Secondary | ICD-10-CM | POA: Diagnosis present

## 2014-11-25 DIAGNOSIS — E010 Iodine-deficiency related diffuse (endemic) goiter: Secondary | ICD-10-CM | POA: Diagnosis present

## 2014-11-25 DIAGNOSIS — Z87891 Personal history of nicotine dependence: Secondary | ICD-10-CM

## 2014-11-25 DIAGNOSIS — F1010 Alcohol abuse, uncomplicated: Secondary | ICD-10-CM | POA: Diagnosis present

## 2014-11-25 DIAGNOSIS — F4323 Adjustment disorder with mixed anxiety and depressed mood: Secondary | ICD-10-CM | POA: Diagnosis present

## 2014-11-25 DIAGNOSIS — J9600 Acute respiratory failure, unspecified whether with hypoxia or hypercapnia: Secondary | ICD-10-CM | POA: Diagnosis present

## 2014-11-25 DIAGNOSIS — F1910 Other psychoactive substance abuse, uncomplicated: Secondary | ICD-10-CM | POA: Diagnosis present

## 2014-11-25 DIAGNOSIS — F1410 Cocaine abuse, uncomplicated: Secondary | ICD-10-CM | POA: Diagnosis present

## 2014-11-25 DIAGNOSIS — Z681 Body mass index (BMI) 19 or less, adult: Secondary | ICD-10-CM

## 2014-11-25 DIAGNOSIS — Z91030 Bee allergy status: Secondary | ICD-10-CM | POA: Diagnosis not present

## 2014-11-25 DIAGNOSIS — Z590 Homelessness: Secondary | ICD-10-CM | POA: Diagnosis not present

## 2014-11-25 DIAGNOSIS — F419 Anxiety disorder, unspecified: Secondary | ICD-10-CM | POA: Diagnosis present

## 2014-11-25 DIAGNOSIS — F3130 Bipolar disorder, current episode depressed, mild or moderate severity, unspecified: Secondary | ICD-10-CM | POA: Diagnosis present

## 2014-11-25 DIAGNOSIS — R946 Abnormal results of thyroid function studies: Secondary | ICD-10-CM | POA: Diagnosis present

## 2014-11-25 DIAGNOSIS — E0590 Thyrotoxicosis, unspecified without thyrotoxic crisis or storm: Secondary | ICD-10-CM | POA: Diagnosis present

## 2014-11-25 DIAGNOSIS — R0600 Dyspnea, unspecified: Secondary | ICD-10-CM

## 2014-11-25 DIAGNOSIS — E049 Nontoxic goiter, unspecified: Secondary | ICD-10-CM

## 2014-11-25 DIAGNOSIS — D500 Iron deficiency anemia secondary to blood loss (chronic): Secondary | ICD-10-CM | POA: Diagnosis present

## 2014-11-25 DIAGNOSIS — Z609 Problem related to social environment, unspecified: Secondary | ICD-10-CM | POA: Diagnosis present

## 2014-11-25 DIAGNOSIS — Z9119 Patient's noncompliance with other medical treatment and regimen: Secondary | ICD-10-CM | POA: Diagnosis present

## 2014-11-25 DIAGNOSIS — F1590 Other stimulant use, unspecified, uncomplicated: Secondary | ICD-10-CM

## 2014-11-25 DIAGNOSIS — H0520 Unspecified exophthalmos: Secondary | ICD-10-CM | POA: Diagnosis present

## 2014-11-25 DIAGNOSIS — M1990 Unspecified osteoarthritis, unspecified site: Secondary | ICD-10-CM | POA: Diagnosis present

## 2014-11-25 DIAGNOSIS — E43 Unspecified severe protein-calorie malnutrition: Secondary | ICD-10-CM | POA: Diagnosis present

## 2014-11-25 DIAGNOSIS — R0602 Shortness of breath: Secondary | ICD-10-CM | POA: Diagnosis present

## 2014-11-25 DIAGNOSIS — Z9842 Cataract extraction status, left eye: Secondary | ICD-10-CM

## 2014-11-25 DIAGNOSIS — R45851 Suicidal ideations: Secondary | ICD-10-CM

## 2014-11-25 LAB — MRSA PCR SCREENING: MRSA BY PCR: NEGATIVE

## 2014-11-25 LAB — CBC
HEMATOCRIT: 31.2 % — AB (ref 39.0–52.0)
Hemoglobin: 10 g/dL — ABNORMAL LOW (ref 13.0–17.0)
MCH: 25.6 pg — ABNORMAL LOW (ref 26.0–34.0)
MCHC: 32.1 g/dL (ref 30.0–36.0)
MCV: 80 fL (ref 78.0–100.0)
Platelets: 82 10*3/uL — ABNORMAL LOW (ref 150–400)
RBC: 3.9 MIL/uL — AB (ref 4.22–5.81)
RDW: 15.4 % (ref 11.5–15.5)
WBC: 7.2 10*3/uL (ref 4.0–10.5)

## 2014-11-25 LAB — COMPREHENSIVE METABOLIC PANEL
ALBUMIN: 2.7 g/dL — AB (ref 3.5–5.2)
ALK PHOS: 185 U/L — AB (ref 39–117)
ALT: 17 U/L (ref 0–53)
AST: 20 U/L (ref 0–37)
Anion gap: 13 (ref 5–15)
BUN: 21 mg/dL (ref 6–23)
CO2: 22 mEq/L (ref 19–32)
Calcium: 9.4 mg/dL (ref 8.4–10.5)
Chloride: 102 mEq/L (ref 96–112)
Creatinine, Ser: 0.5 mg/dL (ref 0.50–1.35)
GFR calc non Af Amer: >90 mL/min (ref >90)
GLUCOSE: 117 mg/dL — AB (ref 70–99)
POTASSIUM: 5.7 meq/L — AB (ref 3.7–5.3)
Sodium: 137 mEq/L (ref 137–147)
TOTAL PROTEIN: 6.2 g/dL (ref 6.0–8.3)
Total Bilirubin: 0.3 mg/dL (ref 0.3–1.2)

## 2014-11-25 LAB — PHOSPHORUS: Phosphorus: 5.7 mg/dL — ABNORMAL HIGH (ref 2.3–4.6)

## 2014-11-25 LAB — MAGNESIUM: Magnesium: 1.7 mg/dL (ref 1.5–2.5)

## 2014-11-25 LAB — POTASSIUM: POTASSIUM: 5.7 meq/L — AB (ref 3.7–5.3)

## 2014-11-25 MED ORDER — ENOXAPARIN SODIUM 30 MG/0.3ML ~~LOC~~ SOLN
30.0000 mg | SUBCUTANEOUS | Status: DC
  Administered 2014-11-25: 30 mg via SUBCUTANEOUS
  Filled 2014-11-25: qty 0.3

## 2014-11-25 MED ORDER — MORPHINE SULFATE 2 MG/ML IJ SOLN: 1.0000 mg | INTRAMUSCULAR | Status: DC | PRN

## 2014-11-25 MED ORDER — ONDANSETRON HCL 4 MG PO TABS: 4.0000 mg | ORAL_TABLET | Freq: Four times a day (QID) | ORAL | Status: DC | PRN

## 2014-11-25 MED ORDER — IOHEXOL 300 MG/ML  SOLN
75.0000 mL | Freq: Once | INTRAMUSCULAR | Status: AC | PRN
  Administered 2014-11-25: 75 mL via INTRAVENOUS

## 2014-11-25 MED ORDER — SODIUM CHLORIDE 0.9 % IV SOLN
INTRAVENOUS | Status: DC
  Administered 2014-11-25: 1000 mL via INTRAVENOUS
  Administered 2014-11-26: 75 mL/h via INTRAVENOUS
  Administered 2014-11-27: 1000 mL via INTRAVENOUS
  Administered 2014-11-27 – 2014-11-29 (×2): via INTRAVENOUS

## 2014-11-25 MED ORDER — ONDANSETRON HCL 4 MG/2ML IJ SOLN
4.0000 mg | Freq: Four times a day (QID) | INTRAMUSCULAR | Status: DC | PRN
  Administered 2014-11-26: 4 mg via INTRAVENOUS
  Filled 2014-11-25: qty 2

## 2014-11-25 NOTE — H&P (Addendum)
[facility] History and Physical  Phillip Kemp BMW:[id number] DOB: July 22, 1961 DOA: 11/25/2014  Referring physician: ED physician PCP: Willey BladeEAN, ERIC, MD   Chief Complaint: shortness of breath   HPI:  53 year old male with a history of hyperthyroidism [facility]2014 after drinking and using drugs off and on for the past 2 weeks. The patient was seeking assistance for his depression, substance abuse, and a place to live as he was recently evicted from his home. The patient was using cocaine and drinking 2 x 40oz beer daily. During evaluation, the patient was noted to have thyromegaly and [facility] was initially asked to consult for further evaluation. [facility], it was noted he was slightly dyspneic and tachycardic with RR 27 bpm and HR 120's respectively. Pt stated that he was previously diagnosed with "overactive thyroid", and he was started on some type of medication but  unfortunately he does not recall the name of the medication.He explained that he last took the medication approximately 3-4 months ago and has not taken any since he ran out. [facility] including TSH, T3 and free T4, consistent with Graves disease. Due to respiratory difficulty, [facility]   Assessment and Plan: Acute respiratory failure - pt currently maintaining oxygen saturations at target range - appears that this is mostly caused by actual hyperthyroid state, not sure that goiter is the cause here - [facility], SDU - plan for CXR and CT neck with contrast  - spoke with ENT on call and has agreed with initial plan  - per Dr. Suszanne Connerseoh, goiter is less likely to cause respiratory compromise and he recommends outpatient follow up even in his office in next 2-3 weeks Thyromegaly with abnormal TSH/Exophthalmos - History and exam along with suppressed TSH, elevated T3 and free T4 suggest a  diagnosis of Graves' disease - TSH <0.005 - antithyroid peroxidase antibodies positive - pt started on Propranolol and Methimazole 2 days prior to this admission  Polysubstance abuse - Including cocaine, alcohol, tobacco - [facility]  Bipolar disorder/substance induced mood disorder - consider psych consult once pt more medically stable  Severe protein calorie malnutrition - Provide Ensure  Radiological Exams on Admission: [place] Soft Tissue Head/neck  11/23/2014   Heterogeneous and hypervascular gland without focal nodule.     Code Status: Full Family Communication: Pt at bedside Disposition Plan: Admit for further evaluation    Review of Systems:  Constitutional: Negative for diaphoresis.  HENT: Negative for hearing loss, ear pain, nosebleeds, congestion, sore throat, neck pain, tinnitus and ear discharge.   Eyes: Negative for blurred vision, double vision, photophobia, pain, discharge and redness.  Respiratory: Negative for wheezing and stridor.   Cardiovascular: Negative for claudication and leg swelling.  Gastrointestinal: Negative for nausea, vomiting and abdominal pain. Genitourinary: Negative for dysuria, urgency, frequency, hematuria and flank pain.  Musculoskeletal: Negative for myalgias, back pain, joint pain and falls.  Skin: Negative for itching and rash.  Neurological: Negative for dizziness and weakness.  Endo/Heme/Allergies: Negative for environmental allergies and polydipsia. Psychiatric/Behavioral: Calm and cooperative     Past Medical History  Diagnosis Date  . Arthritis   . Substance abuse     Cocaine, marijuana, and alcohol  . Suicidal ideation 09/09/2012  . Chronic blood loss anemia 09/09/2012  . Rectal bleeding 09/08/2012  . Acute alcoholic pancreatitis 09/08/2012  .  Depression     Past Surgical History  Procedure Laterality Date  . Orthopedic surgery      Left foot surgery  . Left foot surgery    . Left  cataract extraction      Social History:  reports that he has quit smoking. His smoking use included Cigarettes. He has a 2.5 pack-year smoking history. He quit smokeless tobacco use about 2 years ago. He reports that he drinks about 28.8 oz of alcohol per week. He reports that he uses illicit drugs (Cocaine and Marijuana).  Allergies  Allergen Reactions  . Bee Venom Anaphylaxis    Family History  Problem Relation Age of Onset  . Heart disease Mother   . Cancer Father     prostate    Prior to Admission medications   Medication Sig Start Date End Date Taking? Authorizing Provider  Iron-Vitamins (GERITOL PO) Take 1 tablet by mouth daily.    Historical Provider, MD    Physical Exam: There were no vitals filed for this visit.  Physical Exam  Constitutional: Appears cachectic. No distress.  HENT: Normocephalic. External right and left ear normal. Dry MM Eyes: Conjunctivae and EOM are normal. PERRLA, no scleral icterus.  Neck: Normal ROM. Neck supple. No JVD. Goiter  CVS: Regular rhythm, tachycardic, S1/S2 +, no murmurs, no gallops, no carotid bruit.  Pulmonary: Effort and breath sounds normal, no stridor, diminished breath sounds at bases  Abdominal: Soft. BS +,  no distension, tenderness, rebound or guarding.  Musculoskeletal: Normal range of motion. No edema and no tenderness.  Lymphadenopathy: No lymphadenopathy noted, cervical, inguinal. Neuro: Alert. Normal reflexes, muscle tone coordination. No cranial nerve deficit. Skin: Skin is warm and dry. No rash noted. Not diaphoretic. No erythema. No pallor.  Psychiatric: Normal mood and affect.   Labs on Admission:  Basic Metabolic Panel:  Recent Labs Lab 11/21/14 1347  NA 139  K 3.7  CL 103  CO2 20  GLUCOSE 94  BUN 16  CREATININE 0.45*  CALCIUM 9.5   Liver Function Tests:  Recent Labs Lab 11/21/14 1347  AST 28  ALT 25  ALKPHOS 219*  BILITOT 0.5  PROT 7.2  ALBUMIN 3.3*   CBC:  Recent Labs Lab  11/21/14 1347  WBC 6.9  HGB 9.8*  HCT 30.4*  MCV 78.6  PLT 131*   Cardiac Enzymes:  Recent Labs Lab 11/21/14 1346  TROPONINI <0.30   CBG:  Recent Labs Lab 11/23/14 0653  GLUCAP 129*    EKG: Normal sinus rhythm, no ST/T wave changes  Debbora PrestoMAGICK-Mose Colaizzi, MD  [facility] Pager [phone]  If 7PM-7AM, please contact night-coverage [email or web address] Password [email or web address] 11/25/2014, 5:29 PM

## 2014-11-25 NOTE — [facility] Group Notes (Signed)
[facility] Group Notes:  Healthy Coping skills  Date:  11/25/2014  Time:  9:59 AM  Type of Therapy:  Nurse Education  Participation Level:  Did Not Attend  Participation Quality:  Inattentive  Affect:  Blunted  Cognitive:  Lacking  Insight:  None  Engagement in Group:  None  Modes of Intervention:  Discussion  Summary of Progress/Problems:pt did not attend  Rodman KeyWebb, Diago Haik [facility] 11/25/2014, 9:59 AM

## 2014-11-25 NOTE — Progress Notes (Signed)
Returned from Radiology. CT  and chest xray obtained. Pt tolerated both procedures.

## 2014-11-25 NOTE — Progress Notes (Signed)
D:Pt is in bed this morning reporting tremors and anxiety. Pt rates his depression and hopelessness as an 8 on 1-10 scale with 10 being the most. He rates anxiety as a 7. Pt reports passive si. A:Offered support and 15 minute checks. R:Pt contracts for safety. Safety maintained on the uni.

## 2014-11-25 NOTE — Treatment Plan (Addendum)
Head of bed elevated to approximately 40 degrees, as pt found this position most comfortable.  Pt reports having issues with goiter on trachea area for past year.  Pt reports no syncope or falls as a result of sensation that goiter is cutting off his ability to breath.  At home when he has the sensation, he stands up to relieve the sensation.

## 2014-11-25 NOTE — Progress Notes (Signed)
To Radiology via bed on monitor with RN in attendance.

## 2014-11-25 NOTE — Progress Notes (Addendum)
Pt stated,"I feel really depressed because at night I can not breath and feel like something is stuck in my throat,." NP made aware and wanted the pt to be seen in the ED.[facility], Pt will be seen by Dr. Daphane ShepherdMeyer, internal medicine at 4pm today. Aggie was made aware by Minerva AreolaEric so ER transport on hold for now. Pt stated ,'I have lost 60 pounds because I can not eat and feel like I may stop breathing." NP made aware. Vital signs: 87, 18, 113/60, Sat 97% on room air sitting up . AC made aware we need a bed that elevates so the pt can sit up at a 90 degree angle. Pt stated,"I have so much trouble swallowing." NP made aware, Charge nurse made aware and AC made aware. 4:20pm -Dr Daphane ShepherdMeyer came to see pt and pt will be sent to the ICU stepdown for evaluation for surgery possibly this pm. [facility]. Report to  Carie . 911 here. Pt is stable and all belongings sent with pt./Pt denies feeling SI or HI. Pt was taken via ambulance [facility] to [facility]. Pt was stable prior to transfer with pulse ox 98%

## 2014-11-25 NOTE — Progress Notes (Signed)
Benedetto Coons. Callahan PA made aware of recent lab results K 5.7. Also made aware that the medications [facility] was [facility] were not renewed when transferred. New orders obtained Pt remains on suicide precautions. Sitter at bedside.

## 2014-11-25 NOTE — Progress Notes (Addendum)
Patient ID: Phillip Kemp, male   DOB: 10/19/1961, 53 y.o.   MR[facility]: [id number] [facility]  11/25/2014 2:26 PM Phillip Kemp  MRN:  [id number]  Subjective: Phillip Kemp reports that he is not doing well today. Says he is feeling more depressed and sicker. Is complaining of pain to his enlarged thyroid gland. Says he is having difficulties breathing and swallowing as a result. Says he feels like there is a lump on his throat. Phillip Kemp rates his depression at #9 and anxiety at 8.5. He says he is contemplating suicide by banging his head on the wall.  O: Patient has visible goiter as well as exophthalmos. Hospitalist dr. Izola PriceMyers to come at around 4 pm to evaluate more, if not patient will be transferred to the ED for further eval. Patient uses the wheel chair to aid his mobility.  Diagnosis:   DSM5: Primary Psychiatric Diagnosis: Major depressive disorder ,recurrent ,severe without psychosis   Secondary Psychiatric Diagnosis: Alcohol use disorder,severe Alcohol withdrawal without perceptual disturbances Cannabis use disorder ,severe Stimulant use disorder (cocaine type)   Non Psychiatric Diagnosis:  Total Time spent with patient: 45 minutes   ADL's:  Impaired  Sleep: Fair  Appetite:  Poor   Psychiatric Specialty Exam: Physical Exam  ROS  Blood pressure 117/58, pulse 106, temperature 98.2 F (36.8 C), temperature source Oral, resp. rate 18, height 6\' 2"  (1.88 m), weight 51.71 kg (114 lb), SpO2 100 %.Body mass index is 14.63 kg/(m^2).  General Appearance: Fairly Groomed  Patent attorneyye Contact::  Fair  Speech:  Clear and Coherent  Volume:  Normal  Mood:  Anxious and Depressed  Affect:  Restricted  Thought Process:  Coherent  Orientation:  Full (Time, Place, and Person)  Thought Content:  Rumination  Suicidal Thoughts:  Yes.  with intent/plan to bang his head on the wall, able to contract for safety verbally  Homicidal Thoughts:  No  Memory:  Immediate;   Fair Recent;    Fair Remote;   Fair  Judgement:  Impaired  Insight:  Lacking  Psychomotor Activity:  Tremor  Concentration:  Fair  Recall:  FiservFair  Fund of Knowledge:Fair  Language: Good  Akathisia:  No  Handed:  Right  AIMS (if indicated):     Assets:  Communication Skills  Sleep:  Number of Hours: 6.5   Musculoskeletal: Strength & Muscle Tone: within normal limits Gait & Station: normal Patient leans: N/A  Current Medications: Current Facility-Administered Medications  Medication Dose Route Frequency Provider Last Rate Last Dose  . acetaminophen (TYLENOL) tablet 650 mg  650 mg Oral Q6H PRN Kerry HoughSpencer E Simon, PA-C   650 mg at 11/22/14 0801  . alum & mag hydroxide-simeth (MAALOX/MYLANTA) 200-200-20 MG/5ML suspension 30 mL  30 mL Oral Q4H PRN Kerry HoughSpencer E Simon, PA-C      . aspirin chewable tablet 324 mg  324 mg Oral Daily Kerry HoughSpencer E Simon, PA-C   324 mg at 11/25/14 [id number]  . chlordiazePOXIDE (LIBRIUM) capsule 25 mg  25 mg Oral Q6H PRN Jomarie LongsSaramma Eappen, MD      . chlordiazePOXIDE (LIBRIUM) capsule 25 mg  25 mg Oral BH-qamhs Saramma Eappen, MD   25 mg at 11/25/14 [id number]   Followed by  . [START ON 11/26/2014] chlordiazePOXIDE (LIBRIUM) capsule 25 mg  25 mg Oral Daily Saramma Eappen, MD      . doxycycline (VIBRA-TABS) tablet 100 mg  100 mg Oral Q12H Kerry HoughSpencer E Simon, PA-C   100 mg at 11/25/14 [id number]  . feeding supplement (ENSURE  COMPLETE) (ENSURE COMPLETE) liquid 237 mL  237 mL Oral TID BM Tilda FrancoLindsey Baker, RD   237 mL at 11/25/14 0831  . hydrOXYzine (ATARAX/VISTARIL) tablet 25 mg  25 mg Oral Q6H PRN Kerry HoughSpencer E Simon, PA-C   25 mg at 11/22/14 0801  . loperamide (IMODIUM) capsule 4 mg  4 mg Oral PRN Kerry HoughSpencer E Simon, PA-C   4 mg at 11/24/14 2149  . magnesium hydroxide (MILK OF MAGNESIA) suspension 30 mL  30 mL Oral Daily PRN Kerry HoughSpencer E Simon, PA-C   30 mL at 11/24/14 1618  . methimazole (TAPAZOLE) tablet 5 mg  5 mg Oral TID Dorothea OgleIskra M Myers, MD   5 mg at 11/25/14 1214  . mirtazapine (REMERON) tablet 45 mg  45 mg Oral QHS  Jomarie LongsSaramma Eappen, MD   45 mg at 11/24/14 2147  . multivitamin with minerals tablet 1 tablet  1 tablet Oral Daily Tilda FrancoLindsey Baker, RD   1 tablet at 11/25/14 [id number]  . ondansetron (ZOFRAN-ODT) disintegrating tablet 4 mg  4 mg Oral Q6H PRN Jomarie LongsSaramma Eappen, MD   4 mg at 11/23/14 1022  . propranolol (INDERAL) tablet 40 mg  40 mg Oral BID Dorothea OgleIskra M Myers, MD   40 mg at 11/25/14 [id number]  . thiamine (VITAMIN B-1) tablet 100 mg  100 mg Oral Daily Jomarie LongsSaramma Eappen, MD   100 mg at 11/25/14 [id number]  . tobramycin (TOBREX) 0.3 % ophthalmic solution 1 drop  1 drop Left Eye Q4H Kerry HoughSpencer E Simon, PA-C   1 drop at 11/25/14 1214  . traZODone (DESYREL) tablet 50 mg  50 mg Oral QHS PRN Jomarie LongsSaramma Eappen, MD   50 mg at 11/24/14 2020    Lab Results:  No results found for this or any previous visit (from the past 48 hour(s)).  Physical Findings: AIMS: Facial and Oral Movements Muscles of Facial Expression: None, normal Lips and Perioral Area: None, normal Jaw: None, normal Tongue: None, normal,Extremity Movements Upper (arms, wrists, hands, fingers): None, normal Lower (legs, knees, ankles, toes): None, normal, Trunk Movements Neck, shoulders, hips: None, normal, Overall Severity Severity of abnormal movements (highest score from questions above): None, normal Incapacitation due to abnormal movements: None, normal Patient's awareness of abnormal movements (rate only patient's report): No Awareness, Dental Status Current problems with teeth and/or dentures?: Yes Does patient usually wear dentures?: No  CIWA:  CIWA-Ar Total: 2 COWS:  COWS Total Score: 2  Treatment Plan Summary: Daily contact with patient to assess and evaluate symptoms and progress in treatment Medication management  Plan: Will continue patient on Librium protocol since patient gives hx of heavy alcohol use and has been showing symptoms of withdrawal. Will increase Remeron to 45  mg po qhs for depression, anxiety as well as sleep. Will change Trazodone to  prn. Hospitalist to Dr. Izola PriceMyers to follow-up today to evaluate thyroid disorder. Patient has severe goiter ,with exophthalmos as well as difficulty talking as well as swallowing due to pressure effect. Patient encouraged to attend group activities. CSW will work on disposition. Patient requests placement. CSW aware.  Medical Decision Making Problem Points:  Established problem, worsening (2), Review of last therapy session (1) and Review of psycho-social stressors (1) Data Points:  Discuss tests with performing physician (1) Review or order clinical lab tests (1) Review or order medicine tests (1) Review and summation of old records (2) Review of medication regiment & side effects (2) Review of new medications or change in dosage (2)  I certify that inpatient services furnished can reasonably be  expected to improve the patient's condition.   Sanjuana Kava , PMHNP-BC 11/25/2014, 2:26 PM   I agree with assessment and plan Madie Reno A. Dub Mikes, M.D.

## 2014-11-26 DIAGNOSIS — E0590 Thyrotoxicosis, unspecified without thyrotoxic crisis or storm: Secondary | ICD-10-CM

## 2014-11-26 DIAGNOSIS — E0500 Thyrotoxicosis with diffuse goiter without thyrotoxic crisis or storm: Principal | ICD-10-CM

## 2014-11-26 LAB — COMPREHENSIVE METABOLIC PANEL
ALT: 15 U/L (ref 0–53)
ANION GAP: 12 (ref 5–15)
AST: 19 U/L (ref 0–37)
Albumin: 2.7 g/dL — ABNORMAL LOW (ref 3.5–5.2)
Alkaline Phosphatase: 176 U/L — ABNORMAL HIGH (ref 39–117)
BILIRUBIN TOTAL: 0.5 mg/dL (ref 0.3–1.2)
BUN: 18 mg/dL (ref 6–23)
CHLORIDE: 102 meq/L (ref 96–112)
CO2: 23 meq/L (ref 19–32)
CREATININE: 0.48 mg/dL — AB (ref 0.50–1.35)
Calcium: 9.3 mg/dL (ref 8.4–10.5)
GFR calc Af Amer: >90 mL/min (ref >90)
Glucose, Bld: 92 mg/dL (ref 70–99)
Potassium: 5.1 mEq/L (ref 3.7–5.3)
Sodium: 137 mEq/L (ref 137–147)
Total Protein: 6.1 g/dL (ref 6.0–8.3)

## 2014-11-26 LAB — CBC
HCT: 29.8 % — ABNORMAL LOW (ref 39.0–52.0)
Hemoglobin: 9.6 g/dL — ABNORMAL LOW (ref 13.0–17.0)
MCH: 25.9 pg — ABNORMAL LOW (ref 26.0–34.0)
MCHC: 32.2 g/dL (ref 30.0–36.0)
MCV: 80.3 fL (ref 78.0–100.0)
Platelets: 74 10*3/uL — ABNORMAL LOW (ref 150–400)
RBC: 3.71 MIL/uL — ABNORMAL LOW (ref 4.22–5.81)
RDW: 15.3 % (ref 11.5–15.5)
WBC: 5.8 10*3/uL (ref 4.0–10.5)

## 2014-11-26 MED ORDER — ENOXAPARIN SODIUM 40 MG/0.4ML ~~LOC~~ SOLN
40.0000 mg | SUBCUTANEOUS | Status: DC
  Administered 2014-11-26 – 2014-11-28 (×3): 40 mg via SUBCUTANEOUS
  Filled 2014-11-26 (×4): qty 0.4

## 2014-11-26 MED ORDER — ATENOLOL 50 MG PO TABS
50.0000 mg | ORAL_TABLET | Freq: Every day | ORAL | Status: DC
  Administered 2014-11-26: 50 mg via ORAL
  Filled 2014-11-26: qty 1

## 2014-11-26 MED ORDER — METHIMAZOLE 10 MG PO TABS
10.0000 mg | ORAL_TABLET | Freq: Two times a day (BID) | ORAL | Status: DC
  Administered 2014-11-26 – 2014-11-29 (×6): 10 mg via ORAL
  Filled 2014-11-26 (×9): qty 1

## 2014-11-26 MED ORDER — ATENOLOL 50 MG PO TABS
50.0000 mg | ORAL_TABLET | Freq: Every day | ORAL | Status: DC
  Administered 2014-11-27 – 2014-11-29 (×3): 50 mg via ORAL
  Filled 2014-11-26 (×3): qty 1

## 2014-11-26 NOTE — Progress Notes (Signed)
[place] TEAM 1 - Stepdown/ICU TEAM Progress Note  Phillip Kemp ZOX:[id number] DOB: 1961/06/12 DOA: 11/25/2014 PCP: Willey BladeEAN, ERIC, MD  Admit HPI / Brief Narrative: 53 year old male with a history of hyperthyroidism who [facility] after drinking and using drugs off and on for 2 weeks. The patient was seeking assistance for depression, substance abuse, and a place to live as he was recently evicted from his home. The patient was using cocaine and drinking 2 x 40oz beers daily. During evaluation, the patient was noted to have thyromegaly and [facility] was initially asked to consult for further evaluation. [facility], it was noted he was slightly dyspneic and tachycardic with RR 27 bpm and HR 120's respectively. Pt stated that he was previously diagnosed with "overactive thyroid", and he was started on some type of medication but unfortunately he does not recall the name of the medication.He explained that he last took the medication approximately 3-4 months ago and has not taken any since he ran out. [facility] including TSH, T3 and free T4, consistent with Graves disease. Due to respiratory difficulty, the [facility]   HPI/Subjective: Pt is alert and nontoxic in appearance, though he is hypomanic / quite animated.  He is very hungry and wished to eat.  He states friends began to notice his large goiter ~3 weeks ago.  He denies sob or stridor, difficulty swallowing, or drooling.   He has had weight loss and insomnia for a month or more.    Assessment/Plan:  Thyromegaly with abnormal TSH / Exophthalmos - Grave's disease  History, exam, and suppressed TSH, elevated T3 and free T4 suggest a diagnosis of Graves' disease - TSH <0.005 - antithyroid peroxidase antibodies positive - started on Propranolol and Methimazole 2 days prior to this admission per hx, but can find no orders presently - begin  atenolol and BID tapazole for now - ultimate plan once euthyroid will be for excision v/s RAI ablation given large size of goiter - no clinical findings to suggest thyroid storm at this time   Acute respiratory failure appears that this is mostly caused by hyperthyroid state, not the goiter itself - CT neck with no evidence of airway compression- ENT on call Dr. Suszanne Connerseoh recommends outpatient follow up in his office in next 2-3 weeks  Polysubstance abuse Including cocaine, alcohol, tobacco - [facility] once medically stable   Bipolar disorder/substance induced mood disorder Need to control hyperthyroidism first, then determine if affect requires specific psych tx  Severe protein calorie malnutrition Ensure - nutrition consult - likely due to hyperthyroid state   Code Status: FULL Family Communication: no family present at time of exam Disposition Plan: SDU   Consultants: none  Procedures: [place] Soft Tissue Head/Nck 11/23/2014- heterogeneous and hypervascular gland without focal nodule  Antibiotics: none  DVT prophylaxis: lovenox  Objective: Blood pressure 110/35, pulse 85, temperature 98 F (36.7 C), temperature source Oral, resp. rate 26, height 6' 2.5" (1.892 m), weight 51.6 kg (113 lb 12.1 oz), SpO2 99 %.  Intake/Output Summary (Last 24 hours) at 11/26/14 0835 Last data filed at 11/26/14 0200  Gross per 24 hour  Intake 956.25 ml  Output    150 ml  Net 806.25 ml   Exam: General: No acute respiratory distress Lungs: Clear to auscultation bilaterally without wheezes or crackles Cardiovascular: Regular rate and rhythm without murmur gallop  or rub normal S1 and S2 Abdomen: Nontender, nondistended, soft, bowel sounds positive, no rebound, no ascites, no appreciable mass Extremities: No significant cyanosis, clubbing, or edema bilateral lower extremities  Data Reviewed: Basic Metabolic Panel:  Recent Labs Lab 11/21/14 1347 11/25/14 1846  11/25/14 2221 11/26/14 0315  NA 139 137  --  137  K 3.7 5.7* 5.7* 5.1  CL 103 102  --  102  CO2 20 22  --  23  GLUCOSE 94 117*  --  92  BUN 16 21  --  18  CREATININE 0.45* 0.50  --  0.48*  CALCIUM 9.5 9.4  --  9.3  MG  --  1.7  --   --   PHOS  --  5.7*  --   --     Liver Function Tests:  Recent Labs Lab 11/21/14 1347 11/25/14 1846 11/26/14 0315  AST 28 20 19   ALT 25 17 15   ALKPHOS 219* 185* 176*  BILITOT 0.5 0.3 0.5  PROT 7.2 6.2 6.1  ALBUMIN 3.3* 2.7* 2.7*   CBC:  Recent Labs Lab 11/21/14 1347 11/25/14 1846 11/26/14 0315  WBC 6.9 7.2 5.8  HGB 9.8* 10.0* 9.6*  HCT 30.4* 31.2* 29.8*  MCV 78.6 80.0 80.3  PLT 131* 82* 74*    CBG:  Recent Labs Lab 11/23/14 0653  GLUCAP 129*    Recent Results (from the past 240 hour(s))  GC/Chlamydia Probe Amp     Status: None   Collection Time: 11/21/14  2:34 PM  Result Value Ref Range Status   CT Probe RNA NEGATIVE NEGATIVE Final   GC Probe RNA NEGATIVE NEGATIVE Final    Comment: (NOTE)                                                                                       **Normal Reference Range: Negative**      Assay performed using the Gen-Probe APTIMA COMBO2 (R) Assay. Acceptable specimen types for this assay include APTIMA Swabs (Unisex, endocervical, urethral, or vaginal), first void urine, and ThinPrep liquid based cytology samples. Performed at [facility]   MRSA PCR Screening     Status: None   Collection Time: 11/25/14  5:52 PM  Result Value Ref Range Status   MRSA by PCR NEGATIVE NEGATIVE Final    Comment:        The GeneXpert MRSA Assay (FDA approved for NASAL specimens only), is one component of a comprehensive MRSA colonization surveillance program. It is not intended to diagnose MRSA infection nor to guide or monitor treatment for MRSA infections.      Studies:  Recent x-ray studies have been reviewed in detail by the Attending Physician  Scheduled Meds:  Scheduled Meds: .  enoxaparin (LOVENOX) injection  40 mg Subcutaneous Q24H    Time spent on care of this patient: 35 mins   MCCLUNG,JEFFREY T , MD   [facility] Office  [phone] Pager - Text Page per Loretha StaplerAmion as per below:  On-Call/Text Page:      [email or web address]      password [email or web address]  If 7PM-7AM, please contact night-coverage [email or web address] Password [email or web address] 11/26/2014, 8:35 AM   LOS:  1 day

## 2014-11-27 DIAGNOSIS — F4325 Adjustment disorder with mixed disturbance of emotions and conduct: Secondary | ICD-10-CM

## 2014-11-27 DIAGNOSIS — F1910 Other psychoactive substance abuse, uncomplicated: Secondary | ICD-10-CM

## 2014-11-27 LAB — THYROTROPIN RECEPTOR AUTOABS: Thyrotropin Receptor Ab: 95.5 % — ABNORMAL HIGH (ref <=16.0)

## 2014-11-27 MED ORDER — ZOLPIDEM TARTRATE 5 MG PO TABS
10.0000 mg | ORAL_TABLET | Freq: Once | ORAL | Status: AC
  Administered 2014-11-28: 5 mg via ORAL
  Filled 2014-11-27: qty 2

## 2014-11-27 MED ORDER — ENSURE COMPLETE PO LIQD
237.0000 mL | Freq: Three times a day (TID) | ORAL | Status: DC
  Administered 2014-11-27 – 2014-11-29 (×6): 237 mL via ORAL
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
## 2014-11-27 NOTE — Progress Notes (Addendum)
INITIAL NUTRITION ASSESSMENT  DOCUMENTATION CODES Per approved criteria  -Severe malnutrition in the context of chronic illness -Underweight   INTERVENTION:  Strawberry Ensure Complete po TID, each supplement provides 350 kcal and 13 grams of protein RD to follow for nutrition care plan  NUTRITION DIAGNOSIS: Increased nutrient needs related to malnutrition, chronic illness as evidenced by estimated nutrition needs  Goal: Pt to meet >/= 90% of their estimated nutrition needs   Monitor:  PO & supplemental intake, weight, labs, I/O's  Reason for Assessment: Consult, Malnutrition Screening Tool Report  53 y.o. male  Admitting Dx: shortness of breath  ASSESSMENT: 53 year old male with a history of hyperthyroidism who [facility] after drinking and using drugs off and on for 2 weeks; [facility] TSH, T3 and free T4, consistent with Graves disease; due to respiratory difficulty was [facility].   Patient seen per Clinical Nutrition during previous hospitalizations.  Dx with severe malnutrition which is ongoing.  Reports he's been eating well during hospitalization.  Current PO intake 80-100% per flowsheet records.  Waiting for his lunch tray.  Sitter at bedside.  States he's been receiving Ensure Complete, however, no active order.  RD to order at this time.  Nutrition Focused Physical Exam:  Subcutaneous Fat:  Orbital Region: N/A Upper Arm Region: severe depletion Thoracic and Lumbar Region: N/A  Muscle:  Temple Region: moderate depletion Clavicle Bone Region: severe depletion  Clavicle and Acromion Bone Region: severe depletion Scapular Bone Region: N/A Dorsal Hand: N/A Patellar Region: severe depletion Anterior Thigh Region: severe depletion Posterior Calf Region: severe depletion  Edema: none  Patient meets criteria for severe malnutrition in the context of chronic illness as evidenced  by < 75% intake of estimated energy requirement for > 1 month, severe muscle & subcutaneous fat loss.  Height: Ht Readings from Last 1 Encounters:  11/25/14 6' 2.5" (1.892 m)    Weight: Wt Readings from Last 1 Encounters:  11/27/14 115 lb 4.8 oz (52.3 kg)    Ideal Body Weight: 190 lb  % Ideal Body Weight: 60%  Wt Readings from Last 10 Encounters:  11/27/14 115 lb 4.8 oz (52.3 kg)  11/21/14 114 lb (51.71 kg)  07/15/13 129 lb (58.514 kg)  12/05/12 131 lb (59.421 kg)  11/15/12 140 lb (63.504 kg)  10/20/12 141 lb (63.957 kg)  09/24/12 131 lb 14.4 oz (59.829 kg)  09/11/12 138 lb (62.596 kg)  09/09/12 141 lb 5 oz (64.1 kg)  07/11/12 157 lb (71.215 kg)    Usual Body Weight: 129 lb -- August 2014  % Usual Body Weight: 89%  BMI:  Body mass index is 14.61 kg/(m^2).  Estimated Nutritional Needs: Kcal: 1800-2000 Protein: 90-100 gm Fluid: 1.8-2.0 L  Skin: Intact  Diet Order: Diet regular  EDUCATION NEEDS: -No education needs identified at this time   Intake/Output Summary (Last 24 hours) at 11/27/14 1202 Last data filed at 11/27/14 0700  Gross per 24 hour  Intake   1515 ml  Output    500 ml  Net   1015 ml    Labs:   Recent Labs Lab 11/21/14 1347 11/25/14 1846 11/25/14 2221 11/26/14 0315  NA 139 137  --  137  K 3.7 5.7* 5.7* 5.1  CL 103 102  --  102  CO2 20 22  --  23  BUN 16 21  --  18  CREATININE 0.45* 0.50  --  0.48*  CALCIUM 9.5  9.4  --  9.3  MG  --  1.7  --   --   PHOS  --  5.7*  --   --   GLUCOSE 94 117*  --  92    Scheduled Meds: . atenolol  50 mg Oral Daily  . enoxaparin (LOVENOX) injection  40 mg Subcutaneous Q24H  . methimazole  10 mg Oral BID    Continuous Infusions: . sodium chloride 1,000 mL (11/27/14 0012)    Past Medical History  Diagnosis Date  . Arthritis   . Substance abuse     Cocaine, marijuana, and alcohol  . Suicidal ideation 09/09/2012  . Chronic blood loss anemia 09/09/2012  . Rectal bleeding 09/08/2012  . Acute  alcoholic pancreatitis 09/08/2012  . Depression     Past Surgical History  Procedure Laterality Date  . Orthopedic surgery      Left foot surgery  . Left foot surgery    . Left cataract extraction      Maureen ChattersKatie Kadia Abaya, RD, LDN Pager #: [phone] After-Hours Pager #: [phone]

## 2014-11-27 NOTE — Progress Notes (Signed)
[place] TEAM 1 - Stepdown/ICU TEAM Progress Note  Phillip Kemp:[id number] DOB: 04-Jul-1961 DOA: 11/25/2014 PCP: Willey Blade, MD  Admit HPI / Brief Narrative: 53 year old male with a history of hyperthyroidism who presented to [facility] on 11/21/2014 after drinking and using drugs off and on for 2 weeks. The patient was seeking assistance for depression, substance abuse, and a place to live as he was recently evicted from his home. The patient was using cocaine and drinking 2 40oz beers daily. During evaluation, the patient was noted to have thyromegaly and TRH was initially asked to consult for further evaluation. Once pt seen at the [facility], it was noted he was slightly dyspneic and tachycardic with RR 27 bpm and HR 120's respectively. Pt stated that he was previously diagnosed with "overactive thyroid", and he was started on some type of medication but unfortunately he does not recall the name of the medication.He explained that he last took the medication approximately 3-4 months ago and has not taken any since he ran out. His blood work at the [facility] including TSH, T3 and free T4, consistent with Graves disease. Due to respiratory difficulty, the pt was transferred to [facility] for ongoing care.   HPI/Subjective: Alert, no complaints such as palpitations, CP or SOB. Asked when he was going to have surgery. Explained will need to FU with ENT Teoh after discharge.    Assessment/Plan:  Thyromegaly with abnormal TSH / Exophthalmos - Grave's disease  -History, exam, and suppressed TSH, elevated T3 and free T4 suggest a diagnosis of Graves' disease  -TSH <0.005 - antithyroid peroxidase antibodies positive  -started on Propranolol and Methimazole 2 days prior to this admission per hx, but can find no orders presently  - began atenolol with resolution of tachycardia - Cont BID tapazole for now - ultimate plan once euthyroid will be for excision v/s RAI ablation given large size of  goiter  -no clinical findings to suggest thyroid storm at this time   Acute respiratory failure -resolved -appears that this is mostly caused by hyperthyroid state, not the goiter itself - CT neck with no evidence of airway compression -ENT on call Dr. Suszanne Conners recommended outpatient follow up in his office in next 2-3 weeks  Polysubstance abuse -Including cocaine, alcohol, tobacco  -will possibly need to go back to [facility] once medically stable - will ask Psych to comment  -12/14 contacted [facility] to reconsult for possible readmission    Bipolar disorder/substance induced mood disorder -see above er: pending psych eval  Severe protein calorie malnutrition -Ensure  -nutrition consulted 12/14  -likely due to hyperthyroid state   Code Status: FULL Family Communication: no family present at time of exam Disposition Plan: Transfer to Telemetry    Consultants: none  Procedures: US Soft Tissue Head/Nck 11/23/2014- heterogeneous and hypervascular gland without focal nodule  Antibiotics: none  DVT prophylaxis: lovenox  Objective: Blood pressure 107/64, pulse 83, temperature 98.2 F (36.8 C), temperature source Oral, resp. rate 23, height 6' 2.5" (1.892 m), weight 115 lb 4.8 oz (52.3 kg), SpO2 99 %.  Intake/Output Summary (Last 24 hours) at 11/27/14 1226 Last data filed at 11/27/14 0700  Gross per 24 hour  Intake   1515 ml  Output    500 ml  Net   1015 ml   Exam: General: No acute respiratory distress Lungs: Clear to auscultation bilaterally without wheezes or crackles Cardiovascular: RRR without murmur gallop or rub normal S1 and S2 Abdomen: Nontender, nondistended, soft, bowel sounds positive,  no rebound, no ascites, no appreciable mass Extremities: No edema of bilateral lower extremities. Symmetrical w/o cyanosis or clubbing  Data Reviewed: Basic Metabolic Panel:  Recent Labs Lab 11/21/14 1347 11/25/14 1846 11/25/14 2221 11/26/14 0315  NA 139 137  --  137  K 3.7  5.7* 5.7* 5.1  CL 103 102  --  102  CO2 20 22  --  23  GLUCOSE 94 117*  --  92  BUN 16 21  --  18  CREATININE 0.45* 0.50  --  0.48*  CALCIUM 9.5 9.4  --  9.3  MG  --  1.7  --   --   PHOS  --  5.7*  --   --     Liver Function Tests:  Recent Labs Lab 11/21/14 1347 11/25/14 1846 11/26/14 0315  AST 28 20 19   ALT 25 17 15   ALKPHOS 219* 185* 176*  BILITOT 0.5 0.3 0.5  PROT 7.2 6.2 6.1  ALBUMIN 3.3* 2.7* 2.7*   CBC:  Recent Labs Lab 11/21/14 1347 11/25/14 1846 11/26/14 0315  WBC 6.9 7.2 5.8  HGB 9.8* 10.0* 9.6*  HCT 30.4* 31.2* 29.8*  MCV 78.6 80.0 80.3  PLT 131* 82* 74*    CBG:  Recent Labs Lab 11/23/14 0653  GLUCAP 129*    Recent Results (from the past 240 hour(s))  GC/Chlamydia Probe Amp     Status: None   Collection Time: 11/21/14  2:34 PM  Result Value Ref Range Status   CT Probe RNA NEGATIVE NEGATIVE Final   GC Probe RNA NEGATIVE NEGATIVE Final    Comment: (NOTE)                                                                                       **Normal Reference Range: Negative**      Assay performed using the Gen-Probe APTIMA COMBO2 (R) Assay. Acceptable specimen types for this assay include APTIMA Swabs (Unisex, endocervical, urethral, or vaginal), first void urine, and ThinPrep liquid based cytology samples. Performed at [facility]   MRSA PCR Screening     Status: None   Collection Time: 11/25/14  5:52 PM  Result Value Ref Range Status   MRSA by PCR NEGATIVE NEGATIVE Final    Comment:        The GeneXpert MRSA Assay (FDA approved for NASAL specimens only), is one component of a comprehensive MRSA colonization surveillance program. It is not intended to diagnose MRSA infection nor to guide or monitor treatment for MRSA infections.      Studies:  Recent x-ray studies have been reviewed in detail by the Attending Physician  Scheduled Meds:  Scheduled Meds: . atenolol  50 mg Oral Daily  . enoxaparin (LOVENOX) injection   40 mg Subcutaneous Q24H  . methimazole  10 mg Oral BID    Time spent on care of this patient: 35 mins   ELLIS,ALLISON L. , ANP  [facility] Office  [phone] Pager - Text Page per Loretha StaplerAmion as per below:  On-Call/Text Page:      [email or web address]      password [email or web address]  If 7PM-7AM, please contact night-coverage [email or web address] Password [email or web address] 11/27/2014, 12:26  PM   LOS: 2 days   I have personally examined this patient and reviewed the entire database. I have reviewed the above note, made any necessary editorial changes, and agree with its content.  Lonia BloodJeffrey T. McClung, MD [facility]

## 2014-11-27 NOTE — Progress Notes (Signed)
Chaplain visited with Mr. Phillip Kemp via consult for suicidal thoughts. During the duration of the visit, he didn't mention that he had been having some. Attested that the sitter was present because he couldn't breathe sometimes do his thyroid complications. He expressed being under a lot of duress. He stated he is now homeless and worries about where he will go after he is discharged. He noted that his family relationships are toxic in that they have been stealing from him for a number of years now. He is a father and grandfather of 4. One of his children knows what's going on with him but curtails his story because he doesn't want them worried about him.  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 He and his sister lived together but have been evicted since his sister wouldn't get rid of pets against policy, this happened Saturday. Sister has a new place but pt claims he is not welcomed and didn't plan on returning. He cited verbal abuse and he turned to substance abuse for relief.   Pt desires help finding a place to live once discharged. He doesn't want to go to a shelter but will if he has to. Cites Jesus as his reason to "keep on fighting, I know he'll [Jesus] make a way somehow".   Chaplain and pt reviewed some of his life history, explored his interpretation of him being here and motivation for life, and his family situations.  Will follow as needed.   Gala RomneyBrown, Arlisha Patalano J, Chaplain 11/27/2014

## 2014-11-27 NOTE — Progress Notes (Signed)
Admission note:  Arrival Method: wheelchair  Mental Orientation:A&O X4 Skin: Dry, Intact Pain: Denies Safety Measures: Suicide precautions taken Fall Prevention Safety Plan: Reviewed with pt.  6700 Orientation: Patient has been oriented to the unit, staff and to the room.

## 2014-11-28 LAB — BASIC METABOLIC PANEL
ANION GAP: 11 (ref 5–15)
BUN: 17 mg/dL (ref 6–23)
CO2: 22 mEq/L (ref 19–32)
CREATININE: 0.45 mg/dL — AB (ref 0.50–1.35)
Calcium: 9.7 mg/dL (ref 8.4–10.5)
Chloride: 106 mEq/L (ref 96–112)
GFR calc non Af Amer: >90 mL/min (ref >90)
Glucose, Bld: 94 mg/dL (ref 70–99)
Potassium: 4.7 mEq/L (ref 3.7–5.3)
Sodium: 139 mEq/L (ref 137–147)

## 2014-11-28 MED ORDER — ZOLPIDEM TARTRATE 5 MG PO TABS
10.0000 mg | ORAL_TABLET | Freq: Every evening | ORAL | Status: DC | PRN
  Administered 2014-11-28: 5 mg via ORAL
  Filled 2014-11-28: qty 2

## 2014-11-28 NOTE — Progress Notes (Signed)
[place] TEAM 1 - Stepdown/ICU TEAM Progress Note  Phillip Kemp WUJ:[id number] DOB: [birth year] DOA: 11/25/2014 PCP: Phillip BladeEAN, ERIC, MD  Admit HPI / Brief Narrative: 53 year old male with a history of hyperthyroidism who [facility] 11/21/2014 after drinking and using drugs off and on for 2 weeks. The patient was seeking assistance for depression, substance abuse, and a place to live as he was recently evicted from his home. The patient was using cocaine and drinking 2 40oz beers daily. During evaluation, the patient was noted to have thyromegaly and TRH was initially asked to consult for further evaluation. [facility], it was noted he was slightly dyspneic and tachycardic with RR 27 bpm and HR 120's respectively. Pt stated that he was previously diagnosed with "overactive thyroid", and he was started on some type of medication but unfortunately he does not recall the name of the medication.He explained that he last took the medication approximately 3-4 months ago and has not taken any since he ran out. [facility] including TSH, T3 and free T4, consistent with Graves disease. Due to respiratory difficulty, the pt [facility].  On 11/28/2014 he denies suicidal or homicidal ideations, though reports having some anxiety. He complains of ongoing dysphagia. Awaiting psychiatry evaluation.   HPI/Subjective: Patient states feeling better, tolerating PO intake, denies suicidal/homicidal ideations. Has 1:1 sitter  Assessment/Plan:  Thyromegaly with abnormal TSH / Exophthalmos - Grave's disease  -Has a suppressed TSH less than 0.005 with Free T4 of 7.55 -Thyrotropin receptor antibodies positive -Started Atenolol 50 mg PO q daily -Continue Methimazole 10 mg PO BID -Patient reported noncompliance to methimazole prior to this hospitalization  Acute respiratory failure -Resolved -Likely due to hyperthyroid state,  not the goiter itself - CT neck with no evidence of airway compression -ENT on call Dr. Suszanne Connerseoh recommended outpatient follow up in his office in next 2-3 weeks  Polysubstance abuse -Including cocaine, alcohol, tobacco  -[facility] once [facility] - will ask Psych to comment  -[facility]    Bipolar disorder/substance induced mood disorder -Awaiting psychiatry evaluation, called [facility] today  Severe protein calorie malnutrition -Ensure  -nutrition consulted 12/14  -likely due to hyperthyroid state   Code Status: FULL Family Communication: no family present at time of exam Disposition Plan: Awaiting Psychiatry evaluation  Consultants: none  Procedures: [place] Soft Tissue Head/Nck 11/23/2014- heterogeneous and hypervascular gland without focal nodule  Antibiotics: none  DVT prophylaxis: lovenox  Objective: Blood pressure 131/49, pulse 93, temperature 98.1 F (36.7 C), temperature source Oral, resp. rate 19, height 6' 2.5" (1.892 m), weight 52.3 kg (115 lb 4.8 oz), SpO2 100 %.  Intake/Output Summary (Last 24 hours) at 11/28/14 1429 Last data filed at 11/28/14 [id number]  Gross per 24 hour  Intake   1490 ml  Output      0 ml  Net   1490 ml   Exam: General: No acute respiratory distress Neck: Goiter is present, thyroid have some pain with palpation Lungs: Clear to auscultation bilaterally without wheezes or crackles Cardiovascular: RRR without murmur gallop or rub normal S1 and S2 Abdomen: Nontender, nondistended, soft, bowel sounds positive, no rebound, no ascites, no appreciable mass Extremities: No edema of bilateral lower extremities. Symmetrical w/o cyanosis or clubbing  Data Reviewed: Basic Metabolic Panel:  Recent Labs Lab 11/25/14 1846 11/25/14 2221 11/26/14 0315 11/28/14 0540  NA 137  --  137 139  K 5.7* 5.7* 5.1 4.7  CL 102  --  102 106  CO2 22  --  23 22  GLUCOSE 117*  --  92 94   BUN 21  --  18 17  CREATININE 0.50  --  0.48* 0.45*  CALCIUM 9.4  --  9.3 9.7  MG 1.7  --   --   --   PHOS 5.7*  --   --   --     Liver Function Tests:  Recent Labs Lab 11/25/14 1846 11/26/14 0315  AST 20 19  ALT 17 15  ALKPHOS 185* 176*  BILITOT 0.3 0.5  PROT 6.2 6.1  ALBUMIN 2.7* 2.7*   CBC:  Recent Labs Lab 11/25/14 1846 11/26/14 0315  WBC 7.2 5.8  HGB 10.0* 9.6*  HCT 31.2* 29.8*  MCV 80.0 80.3  PLT 82* 74*    CBG:  Recent Labs Lab 11/23/14 0653  GLUCAP 129*    Recent Results (from the past 240 hour(s))  GC/Chlamydia Probe Amp     Status: None   Collection Time: 11/21/14  2:34 PM  Result Value Ref Range Status   CT Probe RNA NEGATIVE NEGATIVE Final   GC Probe RNA NEGATIVE NEGATIVE Final    Comment: (NOTE)                                                                                       **Normal Reference Range: Negative**      Assay performed using the Gen-Probe APTIMA COMBO2 (R) Assay. Acceptable specimen types for this assay include APTIMA Swabs (Unisex, endocervical, urethral, or vaginal), first void urine, and ThinPrep liquid based cytology samples. Performed at [facility]   MRSA PCR Screening     Status: None   Collection Time: 11/25/14  5:52 PM  Result Value Ref Range Status   MRSA by PCR NEGATIVE NEGATIVE Final    Comment:        The GeneXpert MRSA Assay (FDA approved for NASAL specimens only), is one component of a comprehensive MRSA colonization surveillance program. It is not intended to diagnose MRSA infection nor to guide or monitor treatment for MRSA infections.      Studies:  Recent x-ray studies have been reviewed in detail by the Attending Physician  Scheduled Meds:  Scheduled Meds: . atenolol  50 mg Oral Daily  . enoxaparin (LOVENOX) injection  40 mg Subcutaneous Q24H  . feeding supplement (ENSURE COMPLETE)  237 mL Oral TID BM  . methimazole  10 mg Oral BID    Time spent on care of this patient:  25 mins   Phillip Kemp ,  [facility] Office  [phone]

## 2014-11-28 NOTE — Care Management Note (Signed)
CARE MANAGEMENT NOTE 11/28/2014  Patient:  Phillip Kemp, Phillip Kemp   Account Number:  [id number]  Date Initiated:  11/28/2014  Documentation initiated by:  Jasmine Pang  Subjective/Objective Assessment:   CM following for progression and d/c planning     Action/Plan:   11/28/2014 Met with pt IM given, no needs or plans at this time.   Anticipated DC Date:  12/01/2014   Anticipated DC Plan:  HOME/SELF CARE         Choice offered to / List presented to:             Status of service:   Medicare Important Message given?  YES (If response is "NO", the following Medicare IM given date fields will be blank) Date Medicare IM given:  11/28/2014 Medicare IM given by:  Jareb Radoncic Date Additional Medicare IM given:   Additional Medicare IM given by:    Discharge Disposition:    Per UR Regulation:    If discussed at Long Length of Stay Meetings, dates discussed:    Comments:

## 2014-11-29 MED ORDER — METHIMAZOLE 10 MG PO TABS: 10.0000 mg | ORAL_TABLET | Freq: Two times a day (BID) | ORAL | Status: DC

## 2014-11-29 MED ORDER — ATENOLOL 50 MG PO TABS: 50.0000 mg | ORAL_TABLET | Freq: Every day | ORAL | Status: DC

## 2014-11-29 NOTE — Progress Notes (Signed)
Discharge instructions and medications discussed with patient.  Prescriptions given to patient.  All questions answered.  

## 2014-11-29 NOTE — Discharge Summary (Signed)
Physician Discharge Summary  Patient ID: Phillip Kemp MRN: [id number] DOB/AGE: July 13, 1961 53 y.o.  Admit date: 11/25/2014 Discharge date: 11/29/2014  Primary Care Physician:  Willey Blade, MD  Discharge Diagnoses:   Thyrotoxicosis with thyromegaly, abnormal TSH/exophthalmus with Graves' disease  Acute respiratory failure - resolved  . Bipolar 1 disorder, depressed, severe . Chronic blood loss anemia . Graves' disease . Hyperthyroidism Polysubstance abuse Severe protein calorie malnutrition  Consults:  Psychiatry, Dr. Magdalen Spatz   Recommendations for Outpatient Follow-up:  Patient is to have TSH, T3, T4 rechecked in 4Weeks  Outpatient psychiatry therapy sessions will be arranged by psychiatry social worker prior to discharge    TESTS THAT NEED FOLLOW-UP TSH, T3, T4 in 4 weeks   DIET: Regular diet    Allergies:   Allergies  Allergen Reactions  . Bee Venom Anaphylaxis     Discharge Medications:   Medication List    TAKE these medications        atenolol 50 MG tablet  Commonly known as:  TENORMIN  Take 1 tablet (50 mg total) by mouth daily.     GERITOL PO  Take 1 tablet by mouth daily.     methimazole 10 MG tablet  Commonly known as:  TAPAZOLE  Take 1 tablet (10 mg total) by mouth 2 (two) times daily.         Brief H and P: For complete details please refer to admission H and P, but in brief patient is a 53 year old male with history of hyperthyroidism presented to behavioral health on 11/21/14 after drinking and using drugs off and on for the past 2 weeks prior to admission. Patient was seeking assistance for his depression, substance abuse and a place to live as he was recently evicted from his home. Patient was using cocaine and drinking 2 x 40oz beer daily. During evaluation patient was noted to have thyromegaly and hospitalist service was consulted for further evaluation. Once patient was seen at [facility], it was noted that he was slightly dyspneic  and tachycardiac with respiratory rate 27, heart rate 127 respectively. Patient reported that he was previously diagnosed with overactive thyroid and he was started on some type of medication but unfortunately he did not recall the name of the medication. He explained that he last took the medication approximately 3-4 months ago and had not taken any since he ran out. Due to respiratory difficulty patient was transferred to [facility] for observation.   Hospital Course:   Acute respiratory failure Resolved, likely due to hyperthyroid state and not the goiter itself. CT neck was done which showed no evidence of airway compression. ENT on-call was consulted, Dr Suszanne Conners who recommended outpatient follow-up in his office in next 2-3 weeks.  Thyromegaly with abnormal TSH/exophthalmos consistent with Graves' disease Patient has suppressed TSH less than 0.005, free T4 7.55. Thyrotropin receptor antibody is positive, patient was started on atenolol 50 mg daily and methimazole 10 mg BID. Patient reported noncompliance to methimazole prior to this hospitalization. He was strongly advised to follow-up with his PCP in next 2-3 weeks.  Polysubstance abuse Including cocaine, alcohol and tobacco. Psychiatry was reconsulted for possible readmission to be [facility], patient was seen by Dr. Magdalen Spatz who recommended outpatient psychiatric treatment when medically stable. Per psychiatry, patient does not meet criteria for psychiatry inpatient admission, recommended supportive therapy.  Day of Discharge BP 131/49 mmHg  Pulse 93  Temp(Src) 98.1 F (36.7 C) (Oral)  Resp 19  Ht 6' 2.5" (1.892 m)  Wt 52.3 kg (115 lb 4.8 oz)  BMI 14.61 kg/m2  SpO2 100%  Physical Exam: General: Alert and awake oriented x3 not in any acute distress. HEENT: anicteric sclera, pupils reactive to light and accommodation, goitre, mild pain with palpation CVS: S1-S2 clear no murmur rubs or gallops Chest: clear to auscultation  bilaterally, no wheezing rales or rhonchi Abdomen: soft nontender, nondistended, normal bowel sounds Extremities: no cyanosis, clubbing or edema noted bilaterally Neuro: Cranial nerves II-XII intact, no focal neurological deficits   The results of significant diagnostics from this hospitalization (including imaging, microbiology, ancillary and laboratory) are listed below for reference.    LAB RESULTS: Basic Metabolic Panel:  Recent Labs Lab 11/25/14 1846  11/26/14 0315 11/28/14 0540  NA 137  --  137 139  K 5.7*  < > 5.1 4.7  CL 102  --  102 106  CO2 22  --  23 22  GLUCOSE 117*  --  92 94  BUN 21  --  18 17  CREATININE 0.50  --  0.48* 0.45*  CALCIUM 9.4  --  9.3 9.7  MG 1.7  --   --   --   PHOS 5.7*  --   --   --   < > = values in this interval not displayed. Liver Function Tests:  Recent Labs Lab 11/25/14 1846 11/26/14 0315  AST 20 19  ALT 17 15  ALKPHOS 185* 176*  BILITOT 0.3 0.5  PROT 6.2 6.1  ALBUMIN 2.7* 2.7*   No results for input(s): LIPASE, AMYLASE in the last 168 hours. No results for input(s): AMMONIA in the last 168 hours. CBC:  Recent Labs Lab 11/25/14 1846 11/26/14 0315  WBC 7.2 5.8  HGB 10.0* 9.6*  HCT 31.2* 29.8*  MCV 80.0 80.3  PLT 82* 74*   Cardiac Enzymes: No results for input(s): CKTOTAL, CKMB, CKMBINDEX, TROPONINI in the last 168 hours. BNP: Invalid input(s): POCBNP CBG:  Recent Labs Lab 11/23/14 0653  GLUCAP 129*    Significant Diagnostic Studies:  Dg Chest 2 View  11/25/2014   CLINICAL DATA:  Dyspnea.  EXAM: CHEST  2 VIEW  COMPARISON:  07/15/2013  FINDINGS: Normal heart size and pulmonary vascularity. Mild hyperinflation suggesting emphysematous changes. The around area of consolidation seen previously in the left mid lung has resolved. No focal airspace disease or consolidation seen today. No blunting of costophrenic angles. No pneumothorax. Degenerative changes in the spine. Mediastinal contours appear intact.  IMPRESSION:  Hyperinflation suggesting emphysema. No evidence of active pulmonary disease.   Electronically Signed   By: Burman NievesWilliam  Stevens M.D.   On: 11/25/2014 22:01   Ct Soft Tissue Neck W Contrast  11/25/2014   CLINICAL DATA:  Large Goiter increasing in size. Not causing any other symptoms per Pt Patient cant lay flat or airway obstructing.  EXAM: CT NECK WITH CONTRAST  TECHNIQUE: Multidetector CT imaging of the neck was performed using the standard protocol following the bolus administration of intravenous contrast.  CONTRAST:  75mL OMNIPAQUE IOHEXOL 300 MG/ML  SOLN  COMPARISON:  Chest CT 07/15/2013  FINDINGS: Orbits are normal and symmetric. Paranasal sinuses as well as the mastoid air cells are clear. Remaining spaces of the suprahyoid neck are within normal without focal mass, fluid collection or inflammatory change. There are a couple left cervical chain lymph nodes measuring 1 cm by short axis.  The infrahyoid neck demonstrates a moderately enlarged diffusely heterogeneous thyroid gland without discrete nodule/ mass identified. The thyroid appears slightly larger compared to  07/15/2013 and measures 8 cm in longitudinal dimension. Remainder of the infrahyoid neck is unremarkable. Epiglottis and subglottic airway are normal. Visualized upper lungs and mediastinum are within normal.  There are mild degenerative changes of the spine with partial fusion of the C5 and C6 vertebral bodies. There is disc space narrowing at the C3-4 level.  IMPRESSION: No acute findings.  Moderately enlarged diffusely heterogeneous thyroid gland compatible with goiter. No discrete nodules identified. Slightly larger compared to the prior exam from 07/15/2013. Gland measures 8 cm in longitudinal dimension.   Electronically Signed   By: Elberta Fortisaniel  Boyle M.D.   On: 11/25/2014 21:07       Disposition and Follow-up: Discharge Instructions    Diet general    Complete by:  As directed      Increase activity slowly    Complete by:  As directed              DISPOSITION: home   DISCHARGE FOLLOW-UP Follow-up Information    Follow up with Darletta MollEOH,SUI W, MD. Schedule an appointment as soon as possible for a visit in 2 weeks.   Specialty:  Otolaryngology   Why:  for hospital follow-up   Contact information:   [address] [place] [address] [phone]       Follow up with [facility]    . Schedule an appointment as soon as possible for a visit in 2 weeks.   Why:  for hospital follow-up   Contact information:   [address] [place] [phone]      Follow up with August SaucerEAN, ERIC, MD. Schedule an appointment as soon as possible for a visit in 2 weeks.   Specialty:  Internal Medicine   Why:  for hospital follow-up   Contact information:   [facility] [address] [place] [address] [phone]        Time spent on Discharge: 35 mins   Signed:   RAI,RIPUDEEP M.D. [facility] 11/29/2014, 9:59 AM Pager: [phone]

## 2014-11-29 NOTE — Discharge Summary (Signed)
Physician Discharge Summary Note  Patient:  Phillip Kemp is an 53 y.o., male MRN:  [id number] DOB:  1961-10-18 Patient phone:  [phone] (home)  Patient address:   [address] [place] [address],  Total Time spent with patient: Greater than 30 minutes  Date of Admission:  11/21/2014 Date of Discharge: 11/25/14  Reason for Admission: Difficulty swallowing/breathing  Discharge Diagnoses: Principal Problem:   Thyroid disease Active Problems:   Protein-calorie malnutrition, severe   Bipolar 1 disorder, depressed, severe   Alcohol use disorder, severe, dependence   Graves' disease   Psychiatric Specialty Exam: Physical Exam  Psychiatric: His speech is normal and behavior is normal. Judgment and thought content normal. His mood appears anxious. His affect is not angry, not blunt, not labile and not inappropriate. Cognition and memory are normal. He exhibits a depressed mood.    Review of Systems  Constitutional: Positive for malaise/fatigue.  Eyes: Negative.   Respiratory: Negative.   Cardiovascular: Negative.   Gastrointestinal: Negative.   Genitourinary: Negative.   Musculoskeletal: Positive for neck pain.  Skin: Negative.   Neurological: Positive for weakness.  Endo/Heme/Allergies: Negative.   Psychiatric/Behavioral: Positive for depression, hallucinations and substance abuse. Negative for suicidal ideas and memory loss. The patient is nervous/anxious and has insomnia.     Blood pressure 117/58, pulse 106, temperature 98.2 F (36.8 C), temperature source Oral, resp. rate 18, height 6\' 2"  (1.88 m), weight 51.71 kg (114 lb), SpO2 100 %.Body mass index is 14.63 kg/(m^2).               Past Psychiatric History: Diagnosis: Major depressive disorder, recurrent episodes, Cocaine use disorder, alcohol use disorder  [facility]  Outpatient Care: None reported  Substance Abuse Care: Denies previous treatments   Self-Mutilation: Denies  Suicidal Attempts: Denies attempts, admits thoughts  Violent Behaviors: Denies   Musculoskeletal: Strength & Muscle Tone: within normal limits Gait & Station: unsteady Patient leans: Uses w/c to aid mobility  DSM5: Schizophrenia Disorders:  NA Obsessive-Compulsive Disorders:  NA Trauma-Stressor Disorders:  NA Substance/Addictive Disorders:  Alcohol Related Disorder - Severe (303.90) Depressive Disorders:  Bipolar 1 disorder  Axis Diagnosis:  AXIS I:  Bipolar 1 disorder, Alcohol use disorder AXIS II:  Deferred AXIS III:   Past Medical History  Diagnosis Date  . Arthritis   . Substance abuse     Cocaine, marijuana, and alcohol  . Suicidal ideation 09/09/2012  . Chronic blood loss anemia 09/09/2012  . Rectal bleeding 09/08/2012  . Acute alcoholic pancreatitis 09/08/2012  . Depression    AXIS IV:  other psychosocial or environmental problems and polysubstance dependence AXIS V:  63  Level of Care:  Inpatient   Hospital Course:  Phillip Kemp is 53 years old. African-American male. He reports, "The EMS [facility]. I was drinking a lot of alcohol & using drugs. My sister was suppose to remove the dog from the apt because our landlord does not want a pet in his building. We got evicted because she did not remove the dosg. Now I'm homeless. I have been drinking & using drugs off and on x 10 days. I use & drink because I'm depressed. 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 I also accidentally cut off my right 2 toes when I was 15 with a lawn mower.   Phillip Kemp [facility] due to complain of difficulty breathing and swallowing related to an enlarged thyroid gland (Hyperthyroidism). He presented on admission with bulging eye balls and enlarged thyroid gland. He appears emaciated. Primary care consults placed for the enlarged thyroid.  He was later started on Methimazole 10 mg. However, today, Phillip Kemp presents with restlessness, anxiousness, worsening depression symptoms, pain to his neck areas and suicidal ideation. [facility] is [facility] being [facility] and treatment.  Consults:  psychiatry  Significant Diagnostic Studies:  labs: CBC with diff, CMP, UDS, toxicology tests, U/A, reports reviewed  Discharge Vitals:   Blood pressure 117/58, pulse 106, temperature 98.2 F (36.8 C), temperature source Oral, resp. rate 18, height 6\' 2"  (1.88 m), weight 51.71 kg (114 lb), SpO2 100 %. Body mass index is 14.63 kg/(m^2). Lab Results:   No results found for this or any previous visit (from the past 72 hour(s)).  Physical Findings: AIMS: Facial and Oral Movements Muscles of Facial Expression: None, normal Lips and Perioral Area: None, normal Jaw: None, normal Tongue: None, normal,Extremity Movements Upper (arms, wrists, hands, fingers): None, normal Lower (legs, knees, ankles, toes): None, normal, Trunk Movements Neck, shoulders, hips: None, normal, Overall Severity Severity of abnormal movements (highest score from questions above): None, normal Incapacitation due to abnormal movements: None, normal Patient's awareness of abnormal movements (rate only patient's report): No Awareness, Dental Status Current problems with teeth and/or dentures?: Yes Does patient usually wear dentures?: No  CIWA:  CIWA-Ar Total: 2 COWS:  COWS Total Score: 2  Psychiatric Specialty Exam: See Psychiatric Specialty Exam and Suicide Risk Assessment completed by Attending Physician prior to discharge.  Discharge destination:  Other:  [facility]  Is patient on multiple antipsychotic therapies at discharge:  No   Has Patient had three or more failed trials of antipsychotic monotherapy by history:  No  Recommended Plan for Multiple Antipsychotic Therapies: NA    Medication List    ASK your doctor  about these medications      Indication   GERITOL PO  Take 1 tablet by mouth daily.            Follow-up Information    [facility] On 11/24/2014.     Follow-up recommendations: [facility] for [facility] and treatment   Comments:  Thyroid disease (Hyperthyroidism, acute)  Total Discharge Time:  Greater than 30 minutes.  Signed: Sanjuana KavaNwoko, Agnes I, PMHNP 11/29/2014, 3:39 PM  I personally assessed the patient and formulated the plan Madie RenoIrving A. Dub MikesLugo, M.D.

## 2014-11-29 NOTE — Progress Notes (Signed)
Patient Discharge Instructions:  Next Level Care Provider Has Access to the EMR, 11/29/14  Records provided to [facility] via CHL/Epic access.  Jerelene ReddenSheena E Barrington Kemp, 11/29/2014, 4:05 PM

## 2014-11-29 NOTE — Progress Notes (Signed)
Patient belongings (1 towel, 2 t-shirts) found left in the patient's room after discharge.  Called patient and left message.  Peri MarisAndrew Delores Edelstein, MBA, BS, RN

## 2014-11-29 NOTE — Clinical Social Work Psych Note (Signed)
Psych CSW met with patient at beside.  Psychiatry recommends patient follow-up outpatient with [facility].  Information was reviewed with patient.  Patient is agreeable to outpatient follow-up and acknowledged understanding of the navigation of resources exhibited by teach back.  Patient reports being homeless and requested shelter resources.  Patient reports hx of [employer] and reports [employer] is full.  Psych CSW provided pt with shelter list and review possible options with patient.  Patient reports having someone to stay with for the next few days and has income to maintain own housing.  Patient requests bus pass.  Psych CSW provided one bus pass.  Patient reports no additional needs.  This follow-up information was also placed on the dc summary for patient.  Psych CSW signing off.    Phillip Kemp, [place] [phone]  Psychiatric & Orthopedics (5N 1-16) Clinical Social Worker

## 2014-12-04 LAB — T3: T3, Total: >800 ng/dl — ABNORMAL HIGH (ref 80–204)

## 2014-12-30 ENCOUNTER — Emergency Department (HOSPITAL_COMMUNITY)
Admission: EM | Admit: 2014-12-30 | Discharge: 2014-12-30 | Disposition: A | Discharge status: Home / Self Care | Payer: Medicare Other | Attending: Emergency Medicine | Admitting: Emergency Medicine

## 2014-12-30 ENCOUNTER — Encounter (HOSPITAL_COMMUNITY): Payer: Self-pay | Admitting: Nurse Practitioner

## 2014-12-30 DIAGNOSIS — T382X5A Adverse effect of antithyroid drugs, initial encounter: Secondary | ICD-10-CM | POA: Insufficient documentation

## 2014-12-30 DIAGNOSIS — R300 Dysuria: Secondary | ICD-10-CM | POA: Diagnosis not present

## 2014-12-30 DIAGNOSIS — Z87891 Personal history of nicotine dependence: Secondary | ICD-10-CM | POA: Diagnosis not present

## 2014-12-30 DIAGNOSIS — T447X5A Adverse effect of beta-adrenoreceptor antagonists, initial encounter: Secondary | ICD-10-CM | POA: Insufficient documentation

## 2014-12-30 DIAGNOSIS — E0590 Thyrotoxicosis, unspecified without thyrotoxic crisis or storm: Secondary | ICD-10-CM | POA: Insufficient documentation

## 2014-12-30 DIAGNOSIS — R369 Urethral discharge, unspecified: Secondary | ICD-10-CM | POA: Diagnosis not present

## 2014-12-30 DIAGNOSIS — Z8719 Personal history of other diseases of the digestive system: Secondary | ICD-10-CM | POA: Diagnosis not present

## 2014-12-30 DIAGNOSIS — D500 Iron deficiency anemia secondary to blood loss (chronic): Secondary | ICD-10-CM | POA: Insufficient documentation

## 2014-12-30 DIAGNOSIS — Z8659 Personal history of other mental and behavioral disorders: Secondary | ICD-10-CM | POA: Insufficient documentation

## 2014-12-30 DIAGNOSIS — Y9389 Activity, other specified: Secondary | ICD-10-CM | POA: Insufficient documentation

## 2014-12-30 DIAGNOSIS — Y9289 Other specified places as the place of occurrence of the external cause: Secondary | ICD-10-CM | POA: Diagnosis not present

## 2014-12-30 DIAGNOSIS — Y998 Other external cause status: Secondary | ICD-10-CM | POA: Diagnosis not present

## 2014-12-30 DIAGNOSIS — R21 Rash and other nonspecific skin eruption: Secondary | ICD-10-CM | POA: Diagnosis not present

## 2014-12-30 DIAGNOSIS — Z79899 Other long term (current) drug therapy: Secondary | ICD-10-CM | POA: Diagnosis not present

## 2014-12-30 DIAGNOSIS — T7840XA Allergy, unspecified, initial encounter: Secondary | ICD-10-CM

## 2014-12-30 DIAGNOSIS — E049 Nontoxic goiter, unspecified: Secondary | ICD-10-CM | POA: Diagnosis not present

## 2014-12-30 DIAGNOSIS — Z8739 Personal history of other diseases of the musculoskeletal system and connective tissue: Secondary | ICD-10-CM | POA: Diagnosis not present

## 2014-12-30 HISTORY — DX: Thyrotoxicosis, unspecified without thyrotoxic crisis or storm: E05.90

## 2014-12-30 MED ORDER — PREDNISONE 20 MG PO TABS: ORAL_TABLET | ORAL | Status: DC

## 2014-12-30 MED ORDER — DIPHENHYDRAMINE HCL 25 MG PO CAPS
50.0000 mg | ORAL_CAPSULE | Freq: Once | ORAL | Status: AC
  Administered 2014-12-30: 50 mg via ORAL
  Filled 2014-12-30: qty 2

## 2014-12-30 MED ORDER — DIPHENHYDRAMINE HCL 25 MG PO TABS: 25.0000 mg | ORAL_TABLET | Freq: Four times a day (QID) | ORAL | Status: DC

## 2014-12-30 MED ORDER — PREDNISONE 20 MG PO TABS
60.0000 mg | ORAL_TABLET | Freq: Once | ORAL | Status: AC
  Administered 2014-12-30: 60 mg via ORAL
  Filled 2014-12-30: qty 3

## 2014-12-30 MED ORDER — FAMOTIDINE 20 MG PO TABS: 20.0000 mg | ORAL_TABLET | Freq: Two times a day (BID) | ORAL | Status: DC

## 2014-12-30 MED ORDER — FAMOTIDINE 20 MG PO TABS
20.0000 mg | ORAL_TABLET | Freq: Once | ORAL | Status: AC
  Administered 2014-12-30: 20 mg via ORAL
  Filled 2014-12-30: qty 1

## 2014-12-30 NOTE — ED Provider Notes (Signed)
CSN: [id number]     Arrival date & time 12/30/14  1317 History  This chart was scribed for Fayrene Helper, PA-C, working with Ethelda Chick, MD by Chestine Spore, ED Scribe. The patient was seen in room TR11C/TR11C at 3:40 PM.    Chief Complaint  Patient presents with  . Medication Reaction  . Penile Discharge  . Abdominal Pain     The history is provided by the patient. No language interpreter was used.    HPI Comments: Phillip Kemp is a 54 y.o. male who presents to the Emergency Department complaining of Medication Reaction onset 2 days. He reports that he has started a new thyroid medications atenolol and methimazole 1 month ago and has been itchy since. He has been breaking out in hives on his arms, abdomen, and neck. He notes that he stopped taking the medications and took benadryl and had some relief for 1 day, but now the hives are back. He states that he has tried benadryl with no relief for his symptoms. He denies HA, n/d/v, abdominal pain, fever, chills, and any other symptoms.  He is also concerned for penile discharge onset 1 month ago. He was seen here for dysuria and penile discharge and he was tested for it. He was given a shot and rocephin. He is still having dysuria. He reports having associated symptoms of dysuria. He denies penile discharge and any other symptoms.   Past Medical History  Diagnosis Date  . Arthritis   . Substance abuse     Cocaine, marijuana, and alcohol  . Suicidal ideation 09/09/2012  . Chronic blood loss anemia 09/09/2012  . Rectal bleeding 09/08/2012  . Acute alcoholic pancreatitis 09/08/2012  . Depression   . Hyperthyroidism    Past Surgical History  Procedure Laterality Date  . Orthopedic surgery      Left foot surgery  . Left foot surgery    . Left cataract extraction     Family History  Problem Relation Age of Onset  . Heart disease Mother   . Cancer Father     prostate   History  Substance Use Topics  . Smoking status: Former  Smoker -- 0.25 packs/day for 10 years    Types: Cigarettes  . Smokeless tobacco: Former Neurosurgeon    Quit date: 09/16/2012  . Alcohol Use: 28.8 oz/week    48 Cans of beer per week    Review of Systems  Constitutional: Negative for fever and chills.  Gastrointestinal: Negative for nausea, vomiting and diarrhea.  Genitourinary: Positive for dysuria and discharge.  Skin: Positive for rash.  Neurological: Negative for headaches.      Allergies  Bee venom  Home Medications   Prior to Admission medications   Medication Sig Start Date End Date Taking? Authorizing Provider  atenolol (TENORMIN) 50 MG tablet Take 1 tablet (50 mg total) by mouth daily. 11/29/14   Ripudeep Jenna Luo, MD  Iron-Vitamins (GERITOL PO) Take 1 tablet by mouth daily.    Historical Provider, MD  methimazole (TAPAZOLE) 10 MG tablet Take 1 tablet (10 mg total) by mouth 2 (two) times daily. 11/29/14   Ripudeep K Rai, MD   BP 144/71 mmHg  Pulse 102  Temp(Src) 97.8 F (36.6 C) (Oral)  Resp 16  Ht  (1.905 m)  Wt 129 lb (58.514 kg)  BMI 16.12 kg/m2  SpO2 100%  Physical Exam  Constitutional: He is oriented to person, place, and time. He appears well-developed and well-nourished. No distress.  HENT:  Head: Normocephalic and atraumatic.  Eyes: EOM are normal.  Neck: Neck supple. No tracheal deviation present.  Very appreciatable goiter noted.  Cardiovascular: Normal rate, regular rhythm and normal heart sounds.  Exam reveals no gallop.   No murmur heard. Pulmonary/Chest: Effort normal and breath sounds normal. No respiratory distress. He has no wheezes. He has no rales.  Musculoskeletal: Normal range of motion.  Neurological: He is alert and oriented to person, place, and time.  Skin: Skin is warm and dry. Rash noted.  Excoriations on neck. Pruritus rash noted to arms, upper chest, and abdomen. No oral mucosal involvement.  Psychiatric: He has a normal mood and affect. His behavior is normal.  Nursing note and  vitals reviewed.   ED Course  Procedures (including critical care time) DIAGNOSTIC STUDIES: Oxygen Saturation is 100% on room air, normal by my interpretation.    COORDINATION OF CARE: 3:45 PM-Discussed treatment plan which includes referral to urologist, steroids  with pt at bedside and pt agreed to plan.   3:51 PM Suspect possible allergic reaction to atenolol or methimazole.  Recommend f/u closely with his doctor for medication changes.  Prednisone/benadryl/pepcid given.  No evidence of anaphylactic reaction.    Pt also c/o penile discharge for more than a month, no recent sexual activity.  i have reviewed recent STD cultures and pt is neg for HIV/syphillis/GC/Ch.  Reassurance given.  i offered to reswab but pt sts he has not been sexually active since being the last visit.  Urology referral given as needed.  No dysuria, doubt UTI.  Return precaution discussed.   Labs Review Labs Reviewed - No data to display  Imaging Review No results found.   EKG Interpretation None      MDM   Final diagnoses:  Allergic reaction caused by a drug    BP 144/71 mmHg  Pulse 102  Temp(Src) 97.8 F (36.6 C) (Oral)  Resp 16  Ht 6\' 3"  (1.905 m)  Wt 129 lb (58.514 kg)  BMI 16.12 kg/m2  SpO2 100%   I personally performed the services described in this documentation, which was scribed in my presence. The recorded information has been reviewed and is accurate.    Fayrene HelperBowie Yoshiye Kraft, PA-C 12/30/14 1555  Ethelda ChickMartha K Linker, MD 12/30/14 [phone]

## 2014-12-30 NOTE — Discharge Instructions (Signed)
Please talk to your doctor of possibility of allergic reaction to new medication.  Take prednisone/benadryl/pepcid as prescribed for allergic reaction.  Return if you develop trouble breathing, throat swelling, or worsening condition.     Allergies Allergies may happen from anything your body is sensitive to. This may be food, medicines, pollens, chemicals, and nearly anything around you in everyday life that produces allergens. An allergen is anything that causes an allergy producing substance. Heredity is often a factor in causing these problems. This means you may have some of the same allergies as your parents. Food allergies happen in all age groups. Food allergies are some of the most severe and life threatening. Some common food allergies are cow's milk, seafood, eggs, nuts, wheat, and soybeans. SYMPTOMS   Swelling around the mouth.  An itchy red rash or hives.  Vomiting or diarrhea.  Difficulty breathing. SEVERE ALLERGIC REACTIONS ARE LIFE-THREATENING. This reaction is called anaphylaxis. It can cause the mouth and throat to swell and cause difficulty with breathing and swallowing. In severe reactions only a trace amount of food (for example, peanut oil in a salad) may cause death within seconds. Seasonal allergies occur in all age groups. These are seasonal because they usually occur during the same season every year. They may be a reaction to molds, grass pollens, or tree pollens. Other causes of problems are house dust mite allergens, pet dander, and mold spores. The symptoms often consist of nasal congestion, a runny itchy nose associated with sneezing, and tearing itchy eyes. There is often an associated itching of the mouth and ears. The problems happen when you come in contact with pollens and other allergens. Allergens are the particles in the air that the body reacts to with an allergic reaction. This causes you to release allergic antibodies. Through a chain of events, these  eventually cause you to release histamine into the blood stream. Although it is meant to be protective to the body, it is this release that causes your discomfort. This is why you were given anti-histamines to feel better. If you are unable to pinpoint the offending allergen, it may be determined by skin or blood testing. Allergies cannot be cured but can be controlled with medicine. Hay fever is a collection of all or some of the seasonal allergy problems. It may often be treated with simple over-the-counter medicine such as diphenhydramine. Take medicine as directed. Do not drink alcohol or drive while taking this medicine. Check with your caregiver or package insert for child dosages. If these medicines are not effective, there are many new medicines your caregiver can prescribe. Stronger medicine such as nasal spray, eye drops, and corticosteroids may be used if the first things you try do not work well. Other treatments such as immunotherapy or desensitizing injections can be used if all else fails. Follow up with your caregiver if problems continue. These seasonal allergies are usually not life threatening. They are generally more of a nuisance that can often be handled using medicine. HOME CARE INSTRUCTIONS   If unsure what causes a reaction, keep a diary of foods eaten and symptoms that follow. Avoid foods that cause reactions.  If hives or rash are present:  Take medicine as directed.  You may use an over-the-counter antihistamine (diphenhydramine) for hives and itching as needed.  Apply cold compresses (cloths) to the skin or take baths in cool water. Avoid hot baths or showers. Heat will make a rash and itching worse.  If you are severely allergic:  Following a treatment for a severe reaction, hospitalization is often required for closer follow-up.  Wear a medic-alert bracelet or necklace stating the allergy.  You and your family must learn how to give adrenaline or use an  anaphylaxis kit.  If you have had a severe reaction, always carry your anaphylaxis kit or EpiPen with you. Use this medicine as directed by your caregiver if a severe reaction is occurring. Failure to do so could have a fatal outcome. SEEK MEDICAL CARE IF:  You suspect a food allergy. Symptoms generally happen within 30 minutes of eating a food.  Your symptoms have not gone away within 2 days or are getting worse.  You develop new symptoms.  You want to retest yourself or your child with a food or drink you think causes an allergic reaction. Never do this if an anaphylactic reaction to that food or drink has happened before. Only do this under the care of a caregiver. SEEK IMMEDIATE MEDICAL CARE IF:   You have difficulty breathing, are wheezing, or have a tight feeling in your chest or throat.  You have a swollen mouth, or you have hives, swelling, or itching all over your body.  You have had a severe reaction that has responded to your anaphylaxis kit or an EpiPen. These reactions may return when the medicine has worn off. These reactions should be considered life threatening. MAKE SURE YOU:   Understand these instructions.  Will watch your condition.  Will get help right away if you are not doing well or get worse. Document Released: 02/24/2003 Document Revised: 03/28/2013 Document Reviewed: 07/31/2008 [facility] Patient Information 2015 [place], [place]. This information is not intended to replace advice given to you by your health care provider. Make sure you discuss any questions you have with your health care provider.

## 2014-12-30 NOTE — ED Notes (Signed)
Declined W/C at D/C and was escorted to lobby by RN. 

## 2014-12-30 NOTE — ED Notes (Signed)
Pt reports he started a new thyroid medication 2 months ago and hes been itchy since. He took benadryl with no relief. He has f/u appt with PCP on 1/18

## 2015-01-11 ENCOUNTER — Encounter (HOSPITAL_COMMUNITY): Payer: Self-pay

## 2015-01-11 ENCOUNTER — Emergency Department (HOSPITAL_COMMUNITY)
Admission: EM | Admit: 2015-01-11 | Discharge: 2015-01-11 | Disposition: A | Discharge status: Home / Self Care | Payer: Medicare Other | Attending: Emergency Medicine | Admitting: Emergency Medicine

## 2015-01-11 DIAGNOSIS — S3992XA Unspecified injury of lower back, initial encounter: Secondary | ICD-10-CM | POA: Insufficient documentation

## 2015-01-11 DIAGNOSIS — R21 Rash and other nonspecific skin eruption: Secondary | ICD-10-CM

## 2015-01-11 DIAGNOSIS — Z79899 Other long term (current) drug therapy: Secondary | ICD-10-CM | POA: Insufficient documentation

## 2015-01-11 DIAGNOSIS — Z7952 Long term (current) use of systemic steroids: Secondary | ICD-10-CM | POA: Insufficient documentation

## 2015-01-11 DIAGNOSIS — Z8719 Personal history of other diseases of the digestive system: Secondary | ICD-10-CM | POA: Diagnosis not present

## 2015-01-11 DIAGNOSIS — E039 Hypothyroidism, unspecified: Secondary | ICD-10-CM | POA: Insufficient documentation

## 2015-01-11 DIAGNOSIS — Y998 Other external cause status: Secondary | ICD-10-CM | POA: Diagnosis not present

## 2015-01-11 DIAGNOSIS — W57XXXA Bitten or stung by nonvenomous insect and other nonvenomous arthropods, initial encounter: Secondary | ICD-10-CM | POA: Diagnosis not present

## 2015-01-11 DIAGNOSIS — Y9389 Activity, other specified: Secondary | ICD-10-CM | POA: Insufficient documentation

## 2015-01-11 DIAGNOSIS — S40861A Insect bite (nonvenomous) of right upper arm, initial encounter: Secondary | ICD-10-CM | POA: Insufficient documentation

## 2015-01-11 DIAGNOSIS — Z8659 Personal history of other mental and behavioral disorders: Secondary | ICD-10-CM | POA: Insufficient documentation

## 2015-01-11 DIAGNOSIS — Y9289 Other specified places as the place of occurrence of the external cause: Secondary | ICD-10-CM | POA: Diagnosis not present

## 2015-01-11 DIAGNOSIS — Z862 Personal history of diseases of the blood and blood-forming organs and certain disorders involving the immune mechanism: Secondary | ICD-10-CM | POA: Diagnosis not present

## 2015-01-11 DIAGNOSIS — M549 Dorsalgia, unspecified: Secondary | ICD-10-CM

## 2015-01-11 DIAGNOSIS — S40862A Insect bite (nonvenomous) of left upper arm, initial encounter: Secondary | ICD-10-CM | POA: Diagnosis not present

## 2015-01-11 DIAGNOSIS — S30860A Insect bite (nonvenomous) of lower back and pelvis, initial encounter: Secondary | ICD-10-CM | POA: Diagnosis present

## 2015-01-11 MED ORDER — HYDROCODONE-ACETAMINOPHEN 5-325 MG PO TABS: 1.0000 | ORAL_TABLET | Freq: Four times a day (QID) | ORAL | Status: DC | PRN

## 2015-01-11 MED ORDER — DIPHENHYDRAMINE HCL 25 MG PO CAPS
25.0000 mg | ORAL_CAPSULE | Freq: Once | ORAL | Status: AC
  Administered 2015-01-11: 25 mg via ORAL
  Filled 2015-01-11: qty 1

## 2015-01-11 MED ORDER — PERMETHRIN 5 % EX CREA: TOPICAL_CREAM | CUTANEOUS | Status: DC

## 2015-01-11 MED ORDER — HYDROXYZINE HCL 10 MG PO TABS: 10.0000 mg | ORAL_TABLET | Freq: Three times a day (TID) | ORAL | Status: DC | PRN

## 2015-01-11 NOTE — ED Provider Notes (Signed)
CSN: [id number]     Arrival date & time 01/11/15  1542 History  This chart was scribed for Phillip Horseman, PA-C working with Phillip Canal, MD by [name], ED Scribe. This patient was seen in room TR06C/TR06C and the patient's care was started at 4:15 PM.      Chief Complaint  Patient presents with  . Back Pain  . Allergic Reaction   The history is provided by the patient. No language interpreter was used.   HPI Comments: Phillip Kemp is a 54 y.o. male who presents to the Emergency Department complaining of worsening rash, which he is concerned is an allergic reaction to thyroid medication onset 1 week prior. Pt states that he has an rash on her bilateral arms and back. Pt described the rash as itchy and red that's worse at night. Pt states that the itching is worse after taking a shower as well.  He has tried taking benadryl with some relief.  He also complains of left low back pain from a slip on the ice.   Pt is also complaining of low back pain onset 2 days prior. Pt states that the pain is radiating into his fingers. Pt states that he slipped on ice and landed on his back. Pt doesnt report any related symptoms.   Past Medical History  Diagnosis Date  . Arthritis   . Substance abuse     Cocaine, marijuana, and alcohol  . Suicidal ideation 09/09/2012  . Chronic blood loss anemia 09/09/2012  . Rectal bleeding 09/08/2012  . Acute alcoholic pancreatitis 09/08/2012  . Depression   . Hyperthyroidism    Past Surgical History  Procedure Laterality Date  . Orthopedic surgery      Left foot surgery  . Left foot surgery    . Left cataract extraction     Family History  Problem Relation Age of Onset  . Heart disease Mother   . Cancer Father     prostate   History  Substance Use Topics  . Smoking status: Former Smoker -- 0.25 packs/day for 10 years    Types: Cigarettes  . Smokeless tobacco: Former Neurosurgeon    Quit date: 09/16/2012  . Alcohol Use: 28.8 oz/week    48 Cans of  beer per week    Review of Systems  Constitutional: Negative for fever and chills.  Gastrointestinal:       No bowel incontinence  Genitourinary:       No urinary incontinence  Musculoskeletal: Positive for myalgias, back pain and arthralgias.  Skin: Positive for color change and rash.  Neurological:       No saddle anesthesia      Allergies  Bee venom  Home Medications   Prior to Admission medications   Medication Sig Start Date End Date Taking? Authorizing Provider  atenolol (TENORMIN) 50 MG tablet Take 1 tablet (50 mg total) by mouth daily. 11/29/14   Ripudeep Jenna Luo, MD  diphenhydrAMINE (BENADRYL) 25 MG tablet Take 1 tablet (25 mg total) by mouth every 6 (six) hours. 12/30/14   Fayrene Helper, PA-C  famotidine (PEPCID) 20 MG tablet Take 1 tablet (20 mg total) by mouth 2 (two) times daily. 12/30/14   Fayrene Helper, PA-C  Iron-Vitamins (GERITOL PO) Take 1 tablet by mouth daily.    Historical Provider, MD  methimazole (TAPAZOLE) 10 MG tablet Take 1 tablet (10 mg total) by mouth 2 (two) times daily. 11/29/14   Ripudeep Jenna Luo, MD  predniSONE (DELTASONE) 20 MG tablet  2 tabs po daily x 4 days 12/30/14   Fayrene HelperBowie Tran, PA-C   BP 122/59 mmHg  Pulse 100  Temp(Src) 98.2 F (36.8 C) (Oral)  Resp 18  SpO2 99%   Physical Exam  Constitutional: He is oriented to person, place, and time. He appears well-developed and well-nourished. No distress.  HENT:  Head: Normocephalic and atraumatic.  Eyes: Conjunctivae and EOM are normal. Right eye exhibits no discharge. Left eye exhibits no discharge. No scleral icterus.  Neck: Normal range of motion. Neck supple. No tracheal deviation present.  Cardiovascular: Normal rate, regular rhythm and normal heart sounds.  Exam reveals no gallop and no friction rub.   No murmur heard. Pulmonary/Chest: Effort normal and breath sounds normal. No respiratory distress. He has no wheezes.  Abdominal: Soft. He exhibits no distension. There is no tenderness.   Musculoskeletal: Normal range of motion.  No paraspinal muscles tender to palpation, no bony tenderness, step-offs, or gross abnormality or deformity of spine, patient is able to ambulate, moves all extremities  Chronically flexed elbows from child birth  Bilateral great toe extension intact Bilateral plantar/dorsiflexion intact  Neurological: He is alert and oriented to person, place, and time. He has normal reflexes.  Sensation and strength intact bilaterally Symmetrical reflexes  Skin: Skin is warm and dry. He is not diaphoretic.  Scattered bug bites on upper extremities and torso, no evidence of cellulitis or abscess.   Psychiatric: He has a normal mood and affect. His behavior is normal. Judgment and thought content normal.  Nursing note and vitals reviewed.   ED Course  Procedures (including critical care time) DIAGNOSTIC STUDIES: Oxygen Saturation is 99% on RA, normal by my interpretation.    COORDINATION OF CARE: 4:26 PM-Discussed treatment plan with pt at bedside and pt agreed to plan.     Labs Review Labs Reviewed - No data to display  Imaging Review No results found.   EKG Interpretation None      MDM   Final diagnoses:  Bug bites  Rash  Back pain, unspecified location   Patient with bug bites.  The rash does not seem characteristic of a drug rash, but seems very consistent with scabies or bug bites given the distribution and appearance.  No fevers.    Discussed diagnosis & treatment of scabies with patient and/or parent/guardian.  They have been advised to followup with her primary care doctor 2 weeks after treatment.  They have also been advised to clean entire household, including washing sheets in warm water.   The use of permethrin cream was discussed as well, they were told to use cream from the neck down & leave on for 8-12 hours.  They've been advised to repeat treatment in one week if new eruptions occur. Patient/parent/guardian verbalized  understanding.   Patient with back pain.  No neurological deficits and normal neuro exam.  Patient is ambulatory.  No loss of bowel or bladder control.  Doubt cauda equina.  Denies fever,  doubt epidural abscess or other lesion. Recommend back exercises, stretching, RICE, and will treat with a short course of norco.   I personally performed the services described in this documentation, which was scribed in my presence. The recorded information has been reviewed and is accurate.      Phillip HorsemanRobert Amazing Cowman, PA-C 01/11/15 1635  Phillip Canalavid H Yao, MD 01/11/15 [phone]

## 2015-01-11 NOTE — Discharge Instructions (Signed)
Back Pain, Adult °Low back pain is very common. About 1 in 5 people have back pain. The cause of low back pain is rarely dangerous. The pain often gets better over time. About half of people with a sudden onset of back pain feel better in just 2 weeks. About 8 in 10 people feel better by 6 weeks.  °CAUSES °Some common causes of back pain include: °· Strain of the muscles or ligaments supporting the spine. °· Wear and tear (degeneration) of the spinal discs. °· Arthritis. °· Direct injury to the back. °DIAGNOSIS °Most of the time, the direct cause of low back pain is not known. However, back pain can be treated effectively even when the exact cause of the pain is unknown. Answering your caregiver's questions about your overall health and symptoms is one of the most accurate ways to make sure the cause of your pain is not dangerous. If your caregiver needs more information, he or she may order lab work or imaging tests (X-rays or MRIs). However, even if imaging tests show changes in your back, this usually does not require surgery. °HOME CARE INSTRUCTIONS °For many people, back pain returns. Since low back pain is rarely dangerous, it is often a condition that people can learn to manage on their own.  °· Remain active. It is stressful on the back to sit or stand in one place. Do not sit, drive, or stand in one place for more than 30 minutes at a time. Take short walks on level surfaces as soon as pain allows. Try to increase the length of time you walk each day. °· Do not stay in bed. Resting more than 1 or 2 days can delay your recovery. °· Do not avoid exercise or work. Your body is made to move. It is not dangerous to be active, even though your back may hurt. Your back will likely heal faster if you return to being active before your pain is gone. °· Pay attention to your body when you  bend and lift. Many people have less discomfort when lifting if they bend their knees, keep the load close to their bodies, and  avoid twisting. Often, the most comfortable positions are those that put less stress on your recovering back. °· Find a comfortable position to sleep. Use a firm mattress and lie on your side with your knees slightly bent. If you lie on your back, put a pillow under your knees. °· Only take over-the-counter or prescription medicines as directed by your caregiver. Over-the-counter medicines to reduce pain and inflammation are often the most helpful. Your caregiver may prescribe muscle relaxant drugs. These medicines help dull your pain so you can more quickly return to your normal activities and healthy exercise. °· Put ice on the injured area. °· Put ice in a plastic bag. °· Place a towel between your skin and the bag. °· Leave the ice on for 15-20 minutes, 03-04 times a day for the first 2 to 3 days. After that, ice and heat may be alternated to reduce pain and spasms. °· Ask your caregiver about trying back exercises and gentle massage. This may be of some benefit. °· Avoid feeling anxious or stressed. Stress increases muscle tension and can worsen back pain. It is important to recognize when you are anxious or stressed and learn ways to manage it. Exercise is a great option. °SEEK MEDICAL CARE IF: °· You have pain that is not relieved with rest or medicine. °· You have pain that does not improve in 1 week. °· You have new symptoms. °· You are generally not feeling well. °SEEK   IMMEDIATE MEDICAL CARE IF:  °· You have pain that radiates from your back into your legs. °· You develop new bowel or bladder control problems. °· You have unusual weakness or numbness in your arms or legs. °· You develop nausea or vomiting. °· You develop abdominal pain. °· You feel faint. °Document Released: 12/01/2005 Document Revised: 06/01/2012 Document Reviewed: 04/04/2014 °[employer]® Patient Information ©2015 [employer]. This information is not intended to replace advice given to you by your health care provider. Make sure you  discuss any questions you have with your health care provider. ° °Insect Bite °Mosquitoes, flies, fleas, bedbugs, and many other insects can bite. Insect bites are different from insect stings. A sting is when venom is injected into the skin. Some insect bites can transmit infectious diseases. °SYMPTOMS  °Insect bites usually turn red, swell, and itch for 2 to 4 days. They often go away on their own. °TREATMENT  °Your caregiver may prescribe antibiotic medicines if a bacterial infection develops in the bite. °HOME CARE INSTRUCTIONS °· Do not scratch the bite area. °· Keep the bite area clean and dry. Wash the bite area thoroughly with soap and water. °· Put ice or cool compresses on the bite area. °¨ Put ice in a plastic bag. °¨ Place a towel between your skin and the bag. °¨ Leave the ice on for 20 minutes, 4 times a day for the first 2 to 3 days, or as directed. °· You may apply a baking soda paste, cortisone cream, or calamine lotion to the bite area as directed by your caregiver. This can help reduce itching and swelling. °· Only take over-the-counter or prescription medicines as directed by your caregiver. °· If you are given antibiotics, take them as directed. Finish them even if you start to feel better. °You may need a tetanus shot if: °· You cannot remember when you had your last tetanus shot. °· You have never had a tetanus shot. °· The injury broke your skin. °If you get a tetanus shot, your arm may swell, get red, and feel warm to the touch. This is common and not a problem. If you need a tetanus shot and you choose not to have one, there is a rare chance of getting tetanus. Sickness from tetanus can be serious. °SEEK IMMEDIATE MEDICAL CARE IF:  °· You have increased pain, redness, or swelling in the bite area. °· You see a red line on the skin coming from the bite. °· You have a fever. °· You have joint pain. °· You have a headache or neck pain. °· You have unusual weakness. °· You have a rash. °· You  have chest pain or shortness of breath. °· You have abdominal pain, nausea, or vomiting. °· You feel unusually tired or sleepy. °MAKE SURE YOU:  °· Understand these instructions. °· Will watch your condition. °· Will get help right away if you are not doing well or get worse. °Document Released: 01/08/2005 Document Revised: 02/23/2012 Document Reviewed: 07/02/2011 °[employer]® Patient Information ©2015 [employer]. This information is not intended to replace advice given to you by your health care provider. Make sure you discuss any questions you have with your health care provider. ° ° °

## 2015-01-11 NOTE — ED Notes (Signed)
Pt sts he fell and hit his back.  Reporting lower back pain and left arm pain.  Also sts he is still itching from medications given to him last week when he had an allergic reaction.

## 2015-01-11 NOTE — ED Notes (Signed)
Pt c/o "tailbone pain and elbow pain" after slipping on ice this morning. Also c/o itching since started taking "thyroid medication." Three small bumps noted to center back and a couple of bugs noted to bilateral inner arm. Pt reports taking benadryl for relief

## 2015-03-20 ENCOUNTER — Encounter (HOSPITAL_COMMUNITY): Payer: Self-pay | Admitting: Nurse Practitioner

## 2015-03-20 ENCOUNTER — Emergency Department (HOSPITAL_COMMUNITY)
Admission: EM | Admit: 2015-03-20 | Discharge: 2015-03-21 | Disposition: A | Discharge status: Other Acute Inpatient Hospital | Payer: Medicare Other | Attending: Emergency Medicine | Admitting: Emergency Medicine

## 2015-03-20 DIAGNOSIS — Z862 Personal history of diseases of the blood and blood-forming organs and certain disorders involving the immune mechanism: Secondary | ICD-10-CM | POA: Insufficient documentation

## 2015-03-20 DIAGNOSIS — E0520 Thyrotoxicosis with toxic multinodular goiter without thyrotoxic crisis or storm: Secondary | ICD-10-CM | POA: Diagnosis not present

## 2015-03-20 DIAGNOSIS — F332 Major depressive disorder, recurrent severe without psychotic features: Secondary | ICD-10-CM | POA: Diagnosis not present

## 2015-03-20 DIAGNOSIS — F329 Major depressive disorder, single episode, unspecified: Secondary | ICD-10-CM | POA: Diagnosis not present

## 2015-03-20 DIAGNOSIS — Z7952 Long term (current) use of systemic steroids: Secondary | ICD-10-CM | POA: Diagnosis not present

## 2015-03-20 DIAGNOSIS — F10129 Alcohol abuse with intoxication, unspecified: Secondary | ICD-10-CM | POA: Diagnosis not present

## 2015-03-20 DIAGNOSIS — Z8719 Personal history of other diseases of the digestive system: Secondary | ICD-10-CM | POA: Insufficient documentation

## 2015-03-20 DIAGNOSIS — M1990 Unspecified osteoarthritis, unspecified site: Secondary | ICD-10-CM | POA: Diagnosis not present

## 2015-03-20 DIAGNOSIS — F32A Depression, unspecified: Secondary | ICD-10-CM

## 2015-03-20 DIAGNOSIS — R45851 Suicidal ideations: Secondary | ICD-10-CM

## 2015-03-20 DIAGNOSIS — Z87891 Personal history of nicotine dependence: Secondary | ICD-10-CM | POA: Insufficient documentation

## 2015-03-20 DIAGNOSIS — F1410 Cocaine abuse, uncomplicated: Secondary | ICD-10-CM | POA: Diagnosis not present

## 2015-03-20 DIAGNOSIS — Z79899 Other long term (current) drug therapy: Secondary | ICD-10-CM | POA: Insufficient documentation

## 2015-03-20 DIAGNOSIS — F1210 Cannabis abuse, uncomplicated: Secondary | ICD-10-CM | POA: Insufficient documentation

## 2015-03-20 DIAGNOSIS — F1010 Alcohol abuse, uncomplicated: Secondary | ICD-10-CM | POA: Diagnosis not present

## 2015-03-20 LAB — RAPID URINE DRUG SCREEN, HOSP PERFORMED
AMPHETAMINES: NOT DETECTED
BARBITURATES: NOT DETECTED
Benzodiazepines: NOT DETECTED
Cocaine: POSITIVE — AB
Opiates: NOT DETECTED
Tetrahydrocannabinol: POSITIVE — AB

## 2015-03-20 LAB — COMPREHENSIVE METABOLIC PANEL
ALBUMIN: 3.2 g/dL — AB (ref 3.5–5.2)
ALT: 23 U/L (ref 0–53)
AST: 24 U/L (ref 0–37)
Alkaline Phosphatase: 207 U/L — ABNORMAL HIGH (ref 39–117)
Anion gap: 10 (ref 5–15)
BUN: 13 mg/dL (ref 6–23)
CALCIUM: 8.4 mg/dL (ref 8.4–10.5)
CO2: 19 mmol/L (ref 19–32)
CREATININE: 0.55 mg/dL (ref 0.50–1.35)
Chloride: 110 mmol/L (ref 96–112)
GFR calc Af Amer: >90 mL/min (ref >90)
GFR calc non Af Amer: >90 mL/min (ref >90)
Glucose, Bld: 86 mg/dL (ref 70–99)
Potassium: 4.1 mmol/L (ref 3.5–5.1)
SODIUM: 139 mmol/L (ref 135–145)
TOTAL PROTEIN: 6 g/dL (ref 6.0–8.3)
Total Bilirubin: 0.3 mg/dL (ref 0.3–1.2)

## 2015-03-20 LAB — CBC
HCT: 31.3 % — ABNORMAL LOW (ref 39.0–52.0)
Hemoglobin: 10.2 g/dL — ABNORMAL LOW (ref 13.0–17.0)
MCH: 25.7 pg — ABNORMAL LOW (ref 26.0–34.0)
MCHC: 32.6 g/dL (ref 30.0–36.0)
MCV: 78.8 fL (ref 78.0–100.0)
Platelets: 109 10*3/uL — ABNORMAL LOW (ref 150–400)
RBC: 3.97 MIL/uL — ABNORMAL LOW (ref 4.22–5.81)
RDW: 15 % (ref 11.5–15.5)
WBC: 9 10*3/uL (ref 4.0–10.5)

## 2015-03-20 LAB — ACETAMINOPHEN LEVEL: Acetaminophen (Tylenol), Serum: <10 ug/mL — ABNORMAL LOW (ref 10–30)

## 2015-03-20 LAB — ETHANOL: Alcohol, Ethyl (B): 14 mg/dL — ABNORMAL HIGH (ref 0–9)

## 2015-03-20 LAB — SALICYLATE LEVEL: Salicylate Lvl: <4 mg/dL (ref 2.8–20.0)

## 2015-03-20 MED ORDER — NICOTINE 21 MG/24HR TD PT24
21.0000 mg | MEDICATED_PATCH | Freq: Every day | TRANSDERMAL | Status: DC
  Filled 2015-03-20: qty 1

## 2015-03-20 MED ORDER — ACETAMINOPHEN 325 MG PO TABS: 650.0000 mg | ORAL_TABLET | ORAL | Status: DC | PRN

## 2015-03-20 MED ORDER — METHIMAZOLE 10 MG PO TABS
10.0000 mg | ORAL_TABLET | Freq: Two times a day (BID) | ORAL | Status: DC
  Administered 2015-03-20 – 2015-03-21 (×3): 10 mg via ORAL
  Filled 2015-03-20 (×5): qty 1

## 2015-03-20 MED ORDER — LORAZEPAM 1 MG PO TABS
0.0000 mg | ORAL_TABLET | Freq: Four times a day (QID) | ORAL | Status: DC
  Administered 2015-03-20 – 2015-03-21 (×3): 1 mg via ORAL
  Filled 2015-03-20 (×3): qty 1

## 2015-03-20 MED ORDER — ALUM & MAG HYDROXIDE-SIMETH 200-200-20 MG/5ML PO SUSP: 30.0000 mL | ORAL | Status: DC | PRN

## 2015-03-20 MED ORDER — ONDANSETRON HCL 4 MG PO TABS: 4.0000 mg | ORAL_TABLET | Freq: Three times a day (TID) | ORAL | Status: DC | PRN

## 2015-03-20 MED ORDER — IBUPROFEN 200 MG PO TABS
600.0000 mg | ORAL_TABLET | Freq: Three times a day (TID) | ORAL | Status: DC | PRN
  Administered 2015-03-20: 600 mg via ORAL
  Filled 2015-03-20: qty 3

## 2015-03-20 MED ORDER — DIPHENHYDRAMINE HCL 25 MG PO CAPS
50.0000 mg | ORAL_CAPSULE | Freq: Once | ORAL | Status: AC
  Administered 2015-03-20: 50 mg via ORAL
  Filled 2015-03-20: qty 2

## 2015-03-20 MED ORDER — THIAMINE HCL 100 MG/ML IJ SOLN: 100.0000 mg | Freq: Every day | INTRAMUSCULAR | Status: DC

## 2015-03-20 MED ORDER — IBUPROFEN 200 MG PO TABS
400.0000 mg | ORAL_TABLET | Freq: Once | ORAL | Status: AC
  Administered 2015-03-20: 400 mg via ORAL
  Filled 2015-03-20: qty 2

## 2015-03-20 MED ORDER — ATENOLOL 50 MG PO TABS
50.0000 mg | ORAL_TABLET | Freq: Every day | ORAL | Status: DC
  Administered 2015-03-20 – 2015-03-21 (×2): 50 mg via ORAL
  Filled 2015-03-20 (×3): qty 1

## 2015-03-20 MED ORDER — VITAMIN B-1 100 MG PO TABS
100.0000 mg | ORAL_TABLET | Freq: Every day | ORAL | Status: DC
  Administered 2015-03-20 – 2015-03-21 (×2): 100 mg via ORAL
  Filled 2015-03-20 (×2): qty 1

## 2015-03-20 MED ORDER — DIPHENHYDRAMINE HCL 25 MG PO CAPS
25.0000 mg | ORAL_CAPSULE | Freq: Once | ORAL | Status: AC
  Administered 2015-03-20: 25 mg via ORAL
  Filled 2015-03-20: qty 1

## 2015-03-20 MED ORDER — LORAZEPAM 1 MG PO TABS: 0.0000 mg | ORAL_TABLET | Freq: Two times a day (BID) | ORAL | Status: DC

## 2015-03-20 NOTE — ED Notes (Signed)
Pt presented by EMS who report that [place] a park, states he is depressed over a financial situation, though of cutting his wrist, feels suicidal, has had a couple of 40 oz alcohol drinks.

## 2015-03-20 NOTE — ED Notes (Signed)
Pt has been changed out. Pt needs to be wanded and belongings charted.

## 2015-03-20 NOTE — ED Notes (Signed)
Pt given a sandwich

## 2015-03-20 NOTE — ED Notes (Signed)
Patient to treatment area 39 via ambulatory with a steady gait. Patient reports SI with a plan to cut wrist. Patient reports depressed due to financial troubles and eviction. Patient denies HI and AVH at this time. Plan of care discuss and patient voices no concerns or complaints at this time. Meal given too patient. Encouragement and support provided and safety maintain. Q 15 min safety checks in place.

## 2015-03-20 NOTE — ED Notes (Signed)
Pt and his belongings have been wanded and are behind nurses station in front of room 25

## 2015-03-20 NOTE — ED Notes (Signed)
Security has pt's knife locked up
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
## 2015-03-20 NOTE — ED Notes (Signed)
Pt resting quietly in bed with eyes closed. Respirations are even and unlabored. No acute distress noted. Safety has been maintained with q15 minute observation. Will continue current POC. Acuity low 

## 2015-03-20 NOTE — ED Notes (Signed)
[facility] Expected date:  Expected time:  Means of arrival:  Comments: EMS SI/ETOH

## 2015-03-20 NOTE — ED Notes (Signed)
Patient quiet and comfortable. Acuity low.

## 2015-03-20 NOTE — ED Provider Notes (Signed)
CSN: [id number]     Arrival date & time 03/20/15  0108 History   First MD Initiated Contact with Patient 03/20/15 [phone]     Chief Complaint  Patient presents with  . Suicidal  . Alcohol Intoxication  . Headache     (Consider location/radiation/quality/duration/timing/severity/associated sxs/prior Treatment) HPI  Patient states "I was going to commit suicide". He states "things happen". He states he changed Banks and he did not get his check deposited. He states he was evicted yesterday. He states he started binge drinking and drink tuner 80 ounces of beer. He also had a fifth of liquor in the past 2 days. He states he hasn't eaten in 2 days. He also admits to shooting up cocaine today. He states he's depressed. He states he ran out of his psychiatric medications about a month ago. He is normally followed at [facility].  Patient started having urticarial rash while he was in the ED.  Patient also concerned because he has a goiter. He's currently on medication for that which he states he is taking.  PCP Dr Bary Richard  Past Medical History  Diagnosis Date  . Arthritis   . Substance abuse     Cocaine, marijuana, and alcohol  . Suicidal ideation 09/09/2012  . Chronic blood loss anemia 09/09/2012  . Rectal bleeding 09/08/2012  . Acute alcoholic pancreatitis 09/08/2012  . Depression   . Hyperthyroidism    Past Surgical History  Procedure Laterality Date  . Orthopedic surgery      Left foot surgery  . Left foot surgery    . Left cataract extraction     Family History  Problem Relation Age of Onset  . Heart disease Mother   . Cancer Father     prostate   History  Substance Use Topics  . Smoking status: Former Smoker -- 0.25 packs/day for 10 years    Types: Cigarettes  . Smokeless tobacco: Former Neurosurgeon    Quit date: 09/16/2012  . Alcohol Use: 28.8 oz/week    48 Cans of beer per week    Review of Systems  All other systems reviewed and are negative.     Allergies  Bee  venom  Home Medications   Prior to Admission medications   Medication Sig Start Date End Date Taking? Authorizing Provider  atenolol (TENORMIN) 50 MG tablet Take 1 tablet (50 mg total) by mouth daily. 11/29/14  Yes Ripudeep Jenna Luo, MD  methimazole (TAPAZOLE) 10 MG tablet Take 1 tablet (10 mg total) by mouth 2 (two) times daily. 11/29/14  Yes Ripudeep Jenna Luo, MD  diphenhydrAMINE (BENADRYL) 25 MG tablet Take 1 tablet (25 mg total) by mouth every 6 (six) hours. Patient not taking: Reported on 03/20/2015 12/30/14   Fayrene Helper, PA-C  famotidine (PEPCID) 20 MG tablet Take 1 tablet (20 mg total) by mouth 2 (two) times daily. Patient not taking: Reported on 03/20/2015 12/30/14   Fayrene Helper, PA-C  HYDROcodone-acetaminophen (NORCO/VICODIN) 5-325 MG per tablet Take 1-2 tablets by mouth every 6 (six) hours as needed. Patient not taking: Reported on 03/20/2015 01/11/15   [name], PA-C  hydrOXYzine (ATARAX/VISTARIL) 10 MG tablet Take 1 tablet (10 mg total) by mouth 3 (three) times daily as needed. Patient not taking: Reported on 03/20/2015 01/11/15   [name], PA-C  permethrin (ELIMITE) 5 % cream Apply to entire body other than face - let sit for 14 hours then wash off, may repeat in 1 week if still having symptoms Patient not taking: Reported on  03/20/2015 01/11/15   Roxy Horsemanobert Browning, PA-C  predniSONE (DELTASONE) 20 MG tablet 2 tabs po daily x 4 days Patient not taking: Reported on 03/20/2015 12/30/14   Fayrene HelperBowie Tran, PA-C   BP 133/57 mmHg  Pulse 91  Temp(Src) 97.9 F (36.6 C) (Oral)  Resp 18  SpO2 99% Physical Exam  Constitutional: He is oriented to person, place, and time.  Non-toxic appearance. He does not appear ill. No distress.  Tall thin male  HENT:  Head: Normocephalic and atraumatic.  Right Ear: External ear normal.  Left Ear: External ear normal.  Nose: Nose normal. No mucosal edema or rhinorrhea.  Mouth/Throat: Oropharynx is clear and moist and mucous membranes are normal. No dental abscesses  or uvula swelling.  Eyes: Conjunctivae and EOM are normal. Pupils are equal, round, and reactive to light.  Neck: Normal range of motion and full passive range of motion without pain. Neck supple.  Patient has prominent goiter which is known  Cardiovascular: Normal rate, regular rhythm and normal heart sounds.  Exam reveals no gallop and no friction rub.   No murmur heard. Pulmonary/Chest: Effort normal and breath sounds normal. No respiratory distress. He has no wheezes. He has no rhonchi. He has no rales. He exhibits no tenderness and no crepitus.  Abdominal: Soft. Normal appearance and bowel sounds are normal. He exhibits no distension. There is no tenderness. There is no rebound and no guarding.  Musculoskeletal: Normal range of motion. He exhibits no edema or tenderness.  Moves all extremities well.   Neurological: He is alert and oriented to person, place, and time. He has normal strength. No cranial nerve deficit.  Skin: Skin is warm, dry and intact. No rash noted. No erythema. No pallor.  Psychiatric: He has a normal mood and affect. His speech is normal and behavior is normal. His mood appears not anxious.  Nursing note and vitals reviewed.   ED Course  Procedures (including critical care time)  Medications  acetaminophen (TYLENOL) tablet 650 mg (not administered)  ibuprofen (ADVIL,MOTRIN) tablet 600 mg (not administered)  nicotine (NICODERM CQ - dosed in mg/24 hours) patch 21 mg (not administered)  ondansetron (ZOFRAN) tablet 4 mg (not administered)  alum & mag hydroxide-simeth (MAALOX/MYLANTA) 200-200-20 MG/5ML suspension 30 mL (not administered)  LORazepam (ATIVAN) tablet 0-4 mg (0 mg Oral Not Given 03/20/15 [id number])    Followed by  LORazepam (ATIVAN) tablet 0-4 mg (not administered)  thiamine (VITAMIN B-1) tablet 100 mg (not administered)    Or  thiamine (B-1) injection 100 mg (not administered)  methimazole (TAPAZOLE) tablet 10 mg (not administered)  atenolol (TENORMIN)  tablet 50 mg (not administered)  ibuprofen (ADVIL,MOTRIN) tablet 400 mg (400 mg Oral Given 03/20/15 0140)  diphenhydrAMINE (BENADRYL) capsule 25 mg (25 mg Oral Given 03/20/15 0329)   Nurses report patient having a rash. He is noted had diffuse redness and urticarial lesions. He was given Benadryl and the rash resolved.  Psych holding orders were written.   6:14 Mary from [facility] feels patient does not meet criteria for admission although he had seriously contemplated committing suicide earlier today. She recommends having the psychiatrist reevaluate him in the morning.   Labs Review Results for orders placed or performed during the hospital encounter of 03/20/15  Acetaminophen level  Result Value Ref Range   Acetaminophen (Tylenol), Serum <10.0 (L) 10 - 30 ug/mL  CBC  Result Value Ref Range   WBC 9.0 4.0 - 10.5 K/uL   RBC 3.97 (L) 4.22 - 5.81 MIL/uL  Hemoglobin 10.2 (L) 13.0 - 17.0 g/dL   HCT 16.1 (L) 09.6 - 04.5 %   MCV 78.8 78.0 - 100.0 fL   MCH 25.7 (L) 26.0 - 34.0 pg   MCHC 32.6 30.0 - 36.0 g/dL   RDW 40.9 81.1 - 91.4 %   Platelets 109 (L) 150 - 400 K/uL  Comprehensive metabolic panel  Result Value Ref Range   Sodium 139 135 - 145 mmol/L   Potassium 4.1 3.5 - 5.1 mmol/L   Chloride 110 96 - 112 mmol/L   CO2 19 19 - 32 mmol/L   Glucose, Bld 86 70 - 99 mg/dL   BUN 13 6 - 23 mg/dL   Creatinine, Ser 7.82 0.50 - 1.35 mg/dL   Calcium 8.4 8.4 - 95.6 mg/dL   Total Protein 6.0 6.0 - 8.3 g/dL   Albumin 3.2 (L) 3.5 - 5.2 g/dL   AST 24 0 - 37 U/L   ALT 23 0 - 53 U/L   Alkaline Phosphatase 207 (H) 39 - 117 U/L   Total Bilirubin 0.3 0.3 - 1.2 mg/dL   GFR calc non Af Amer >90 >90 mL/min   GFR calc Af Amer >90 >90 mL/min   Anion gap 10 5 - 15  Ethanol (ETOH)  Result Value Ref Range   Alcohol, Ethyl (B) 14 (H) 0 - 9 mg/dL  Salicylate level  Result Value Ref Range   Salicylate Lvl <4.0 2.8 - 20.0 mg/dL  Urine Drug Screen  Result Value Ref Range   Opiates NONE DETECTED NONE DETECTED    Cocaine POSITIVE (A) NONE DETECTED   Benzodiazepines NONE DETECTED NONE DETECTED   Amphetamines NONE DETECTED NONE DETECTED   Tetrahydrocannabinol POSITIVE (A) NONE DETECTED   Barbiturates NONE DETECTED NONE DETECTED   Laboratory interpretation all normal except positive UDS, anemia     Imaging Review No results found.   EKG Interpretation None      MDM   Final diagnoses:  Depressed  Suicidal thoughts  Cocaine abuse    Disposition pending  Devoria Albe, MD, Concha Pyo, MD 03/20/15 [id number]

## 2015-03-20 NOTE — ED Notes (Signed)
Provided pt with OJ. Pt stated he was sick on his stomach.

## 2015-03-20 NOTE — ED Notes (Signed)
TTS in progress 

## 2015-03-20 NOTE — Consult Note (Signed)
[facility] Consult   Reason for Consult:  Suicidal Referring Physician:  EDP Patient Identification: Phillip Kemp MRN:  [id number] Principal Diagnosis: Severe recurrent major depression without psychotic features Diagnosis:   Patient Active Problem List   Diagnosis Date Noted  . Alcohol abuse [F10.10] 03/20/2015    Priority: High  . Severe recurrent major depression without psychotic features [F33.2] 03/20/2015    Priority: High  . Suicidal ideations [R45.851] 03/20/2015    Priority: High  . Cocaine abuse [F14.10] 09/08/2012    Priority: High  . Hyperthyroidism [E05.90] 11/25/2014  . Alcohol use disorder, severe, dependence [F10.20] 11/22/2014  . Thyroid disease [E07.9] 11/22/2014  . Graves' disease [E05.00] 11/22/2014  . Bipolar 1 disorder, depressed, severe [F31.4] 11/21/2014  . Aspiration pneumonia [J69.0] 07/15/2013  . Hypokalemia [E87.6] 07/15/2013  . Elevated LFTs [R79.89] 07/15/2013  . Microcytic anemia [D50.9] 07/15/2013  . Thrombocytopenia [D69.6] 07/15/2013  . 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 Adrenal nodule, left [E27.9] 07/15/2013  . Chest pain, midsternal [R07.2] 09/24/2012  . Depression [F32.9] 09/11/2012  . Papules [L98.9] 09/10/2012  . Suicidal ideation [R45.851] 09/09/2012  . Chronic blood loss anemia [D50.0] 09/09/2012  . Acute alcoholic pancreatitis [F79.0] 09/08/2012  . Rectal bleeding [K62.5] 09/08/2012  . Anemia [D64.9] 09/08/2012  . Left adrenal mass [E27.9] 09/08/2012  . Pseudogout of knee [M11.869] 08/27/2011  . Substance abuse [F19.10] 08/27/2011  . Chronic pain [G89.29] 08/27/2011    Total Time spent with patient: 45 minutes  Subjective:   Phillip Kemp is a 54 y.o. male patient admitted with depression and suicidal ideations with plan to overdose.  HPI:  The patient presented to the ED after attempting to overdose on cocaine.  He states he was also drinking and using THC.  Haji continues to endorse suicidal  ideations with not feeling safe discharging.  His biggest issues are financial.  He recently moved he stopped using [employer] because he was using over draft every month since December.  [place] transferred his monies to [employer].  However, his SSI check did not show up on his card and [name] said they did not receive it but SSI stated they sent it.  He has no money until next month.  He had been homeless for two days after being evicted and felt suicidal, overdosed.  Denies homicidal ideations, hallucinations. HPI Elements:   Location:  generalized. Quality:  acute. Severity:  severe. Timing:  constant. Duration:  two days. Context:  stressors.  Past Medical History:  Past Medical History  Diagnosis Date  . Arthritis   . Substance abuse     Cocaine, marijuana, and alcohol  . Suicidal ideation 09/09/2012  . Chronic blood loss anemia 09/09/2012  . Rectal bleeding 09/08/2012  . Acute alcoholic pancreatitis 3/83/3383  . Depression   . Hyperthyroidism     Past Surgical History  Procedure Laterality Date  . Orthopedic surgery      Left foot surgery  . Left foot surgery    . Left cataract extraction     Family History:  Family History  Problem Relation Age of Onset  . Heart disease Mother   . Cancer Father     prostate   Social History:  History  Alcohol Use  . 28.8 oz/week  . 48 Cans of beer per week     History  Drug Use  . Yes  . Special: Marijuana    History   Social History  . Marital Status: Single    Spouse Name:  N/A  . Number of Children: 2  . Years of Education: 12   Occupational History  . Teaching laboratory technician.    Social History Main Topics  . Smoking status: Former Smoker -- 0.25 packs/day for 10 years    Types: Cigarettes  . Smokeless tobacco: Former Systems developer    Quit date: 09/16/2012  . Alcohol Use: 28.8 oz/week    48 Cans of beer per week  . Drug Use: Yes    Special: Marijuana  . Sexual Activity: No   Other Topics Concern  . None   Social History  Narrative   Widowed.  Wife died of leukemia.  2 children.  Lives in the [place].   Additional Social History:    Prescriptions: See PTA list History of alcohol / drug use?: Yes Longest period of sobriety (when/how long): 8-9 months Negative Consequences of Use: Financial, Legal, Personal relationships, Work / School Name of Substance 1: Alcohol 1 - Age of First Use: 9 1 - Amount (size/oz): 2-40 oz beers 1 - Frequency: weekends 1 - Duration: 17 years 1 - Last Use / Amount: 2days ago Name of Substance 2: Crack cocaine 2 - Age of First Use: 36 2 - Amount (size/oz): $80 worth 2 - Frequency: DAILY 2 - Duration: 10 years 2 - Last Use / Amount: 2 days ago                 Allergies:   Allergies  Allergen Reactions  . Bee Venom Anaphylaxis    Labs:  Results for orders placed or performed during the hospital encounter of 03/20/15 (from the past 48 hour(s))  Urine Drug Screen     Status: Abnormal   Collection Time: 03/20/15  1:40 AM  Result Value Ref Range   Opiates NONE DETECTED NONE DETECTED   Cocaine POSITIVE (A) NONE DETECTED   Benzodiazepines NONE DETECTED NONE DETECTED   Amphetamines NONE DETECTED NONE DETECTED   Tetrahydrocannabinol POSITIVE (A) NONE DETECTED   Barbiturates NONE DETECTED NONE DETECTED    Comment:        DRUG SCREEN FOR MEDICAL PURPOSES ONLY.  IF CONFIRMATION IS NEEDED FOR ANY PURPOSE, NOTIFY LAB WITHIN 5 DAYS.        LOWEST DETECTABLE LIMITS FOR URINE DRUG SCREEN Drug Class       Cutoff (ng/mL) Amphetamine      1000 Barbiturate      200 Benzodiazepine   756 Tricyclics       433 Opiates          300 Cocaine          300 THC              50   Acetaminophen level     Status: Abnormal   Collection Time: 03/20/15  1:48 AM  Result Value Ref Range   Acetaminophen (Tylenol), Serum <10.0 (L) 10 - 30 ug/mL    Comment:        THERAPEUTIC CONCENTRATIONS VARY SIGNIFICANTLY. A RANGE OF 10-30 ug/mL MAY BE AN EFFECTIVE CONCENTRATION FOR  MANY PATIENTS. HOWEVER, SOME ARE BEST TREATED AT CONCENTRATIONS OUTSIDE THIS RANGE. ACETAMINOPHEN CONCENTRATIONS >150 ug/mL AT 4 HOURS AFTER INGESTION AND >50 ug/mL AT 12 HOURS AFTER INGESTION ARE OFTEN ASSOCIATED WITH TOXIC REACTIONS.   Ethanol (ETOH)     Status: Abnormal   Collection Time: 03/20/15  1:48 AM  Result Value Ref Range   Alcohol, Ethyl (B) 14 (H) 0 - 9 mg/dL    Comment:  LOWEST DETECTABLE LIMIT FOR SERUM ALCOHOL IS 11 mg/dL FOR MEDICAL PURPOSES ONLY   Salicylate level     Status: None   Collection Time: 03/20/15  1:48 AM  Result Value Ref Range   Salicylate Lvl <2.5 2.8 - 20.0 mg/dL  CBC     Status: Abnormal   Collection Time: 03/20/15  1:49 AM  Result Value Ref Range   WBC 9.0 4.0 - 10.5 K/uL   RBC 3.97 (L) 4.22 - 5.81 MIL/uL   Hemoglobin 10.2 (L) 13.0 - 17.0 g/dL   HCT 31.3 (L) 39.0 - 52.0 %   MCV 78.8 78.0 - 100.0 fL   MCH 25.7 (L) 26.0 - 34.0 pg   MCHC 32.6 30.0 - 36.0 g/dL   RDW 15.0 11.5 - 15.5 %   Platelets 109 (L) 150 - 400 K/uL    Comment: REPEATED TO VERIFY SPECIMEN CHECKED FOR CLOTS PLATELET COUNT CONFIRMED BY SMEAR   Comprehensive metabolic panel     Status: Abnormal   Collection Time: 03/20/15  1:49 AM  Result Value Ref Range   Sodium 139 135 - 145 mmol/L   Potassium 4.1 3.5 - 5.1 mmol/L   Chloride 110 96 - 112 mmol/L   CO2 19 19 - 32 mmol/L   Glucose, Bld 86 70 - 99 mg/dL   BUN 13 6 - 23 mg/dL   Creatinine, Ser 0.55 0.50 - 1.35 mg/dL   Calcium 8.4 8.4 - 10.5 mg/dL   Total Protein 6.0 6.0 - 8.3 g/dL   Albumin 3.2 (L) 3.5 - 5.2 g/dL   AST 24 0 - 37 U/L   ALT 23 0 - 53 U/L   Alkaline Phosphatase 207 (H) 39 - 117 U/L   Total Bilirubin 0.3 0.3 - 1.2 mg/dL   GFR calc non Af Amer >90 >90 mL/min   GFR calc Af Amer >90 >90 mL/min    Comment: (NOTE) The eGFR has been calculated using the CKD EPI equation. This calculation has not been validated in all clinical situations. eGFR's persistently <90 mL/min signify possible Chronic  Kidney Disease.    Anion gap 10 5 - 15    Vitals: Blood pressure 133/57, pulse 91, temperature 97.9 F (36.6 C), temperature source Oral, resp. rate 18, SpO2 99 %.  Risk to Self: Suicidal Ideation: Yes-Currently Present (earlier today) Suicidal Intent: Yes-Currently Present (earleir today) Is patient at risk for suicide?: Yes Suicidal Plan?: Yes-Currently Present (earlier today) Specify Current Suicidal Plan: cut wrist with a knife Access to Means: Yes Specify Access to Suicidal Means: knife What has been your use of drugs/alcohol within the last 12 months?: weekly use How many times?: 0 Other Self Harm Risks: denies Triggers for Past Attempts: Unknown Intentional Self Injurious Behavior: None Risk to Others: Homicidal Ideation: No (denies) Thoughts of Harm to Others: No (denies) Current Homicidal Intent: No (denies) Current Homicidal Plan: No (denies) Access to Homicidal Means: No Identified Victim: na History of harm to others?: No Assessment of Violence: None Noted Violent Behavior Description: na Does patient have access to weapons?: Yes (Comment) (knife) Criminal Charges Pending?: No Does patient have a court date: No Prior Inpatient Therapy: Prior Inpatient Therapy: Yes Prior Therapy Dates: multiple Prior Therapy Facilty/Provider(s): [facility] Reason for Treatment: Depression Prior Outpatient Therapy: Prior Outpatient Therapy: No  Current Facility-Administered Medications  Medication Dose Route Frequency Provider Last Rate Last Dose  . acetaminophen (TYLENOL) tablet 650 mg  650 mg Oral Q4H PRN Rolland Porter, MD      . alum &  mag hydroxide-simeth (MAALOX/MYLANTA) 200-200-20 MG/5ML suspension 30 mL  30 mL Oral PRN Rolland Porter, MD      . atenolol (TENORMIN) tablet 50 mg  50 mg Oral Daily Rolland Porter, MD   50 mg at 03/20/15 0953  . ibuprofen (ADVIL,MOTRIN) tablet 600 mg  600 mg Oral Q8H PRN Rolland Porter, MD      . LORazepam (ATIVAN) tablet 0-4 mg  0-4 mg Oral 4 times per day Rolland Porter,  MD   0 mg at 03/20/15 8921   Followed by  . [START ON 03/22/2015] LORazepam (ATIVAN) tablet 0-4 mg  0-4 mg Oral Q12H Rolland Porter, MD      . methimazole (TAPAZOLE) tablet 10 mg  10 mg Oral BID Rolland Porter, MD   10 mg at 03/20/15 0953  . nicotine (NICODERM CQ - dosed in mg/24 hours) patch 21 mg  21 mg Transdermal Daily Rolland Porter, MD   21 mg at 03/20/15 0918  . ondansetron (ZOFRAN) tablet 4 mg  4 mg Oral Q8H PRN Rolland Porter, MD      . thiamine (VITAMIN B-1) tablet 100 mg  100 mg Oral Daily Rolland Porter, MD   100 mg at 03/20/15 1941   Or  . thiamine (B-1) injection 100 mg  100 mg Intravenous Daily Rolland Porter, MD       Current Outpatient Prescriptions  Medication Sig Dispense Refill  . atenolol (TENORMIN) 50 MG tablet Take 1 tablet (50 mg total) by mouth daily. 30 tablet 4  . methimazole (TAPAZOLE) 10 MG tablet Take 1 tablet (10 mg total) by mouth 2 (two) times daily. 60 tablet 4  . diphenhydrAMINE (BENADRYL) 25 MG tablet Take 1 tablet (25 mg total) by mouth every 6 (six) hours. (Patient not taking: Reported on 03/20/2015) 20 tablet 0  . famotidine (PEPCID) 20 MG tablet Take 1 tablet (20 mg total) by mouth 2 (two) times daily. (Patient not taking: Reported on 03/20/2015) 30 tablet 0  . HYDROcodone-acetaminophen (NORCO/VICODIN) 5-325 MG per tablet Take 1-2 tablets by mouth every 6 (six) hours as needed. (Patient not taking: Reported on 03/20/2015) 10 tablet 0  . hydrOXYzine (ATARAX/VISTARIL) 10 MG tablet Take 1 tablet (10 mg total) by mouth 3 (three) times daily as needed. (Patient not taking: Reported on 03/20/2015) 30 tablet 0  . permethrin (ELIMITE) 5 % cream Apply to entire body other than face - let sit for 14 hours then wash off, may repeat in 1 week if still having symptoms (Patient not taking: Reported on 03/20/2015) 60 g 1  . predniSONE (DELTASONE) 20 MG tablet 2 tabs po daily x 4 days (Patient not taking: Reported on 03/20/2015) 8 tablet 0    Musculoskeletal: Strength & Muscle Tone: within normal limits Gait &  Station: normal Patient leans: N/A  Psychiatric Specialty Exam:     Blood pressure 133/57, pulse 91, temperature 97.9 F (36.6 C), temperature source Oral, resp. rate 18, SpO2 99 %.There is no weight on file to calculate BMI.  General Appearance: Casual  Eye Contact::  Good  Speech:  Normal Rate  Volume:  Normal  Mood:  Depressed  Affect:  Congruent  Thought Process:  Coherent  Orientation:  Full (Time, Place, and Person)  Thought Content:  WDL  Suicidal Thoughts:  Yes.  with intent/plan  Homicidal Thoughts:  No  Memory:  Immediate;   Fair Recent;   Fair Remote;   Fair  Judgement:  Poor  Insight:  Fair  Psychomotor Activity:  Decreased  Concentration:  Fair  Recall:  Smiley Houseman of Knowledge:Fair  Language: Good  Akathisia:  No  Handed:  Right  AIMS (if indicated):     Assets:  Leisure Time Resilience  ADL's:  Intact  Cognition: WNL  Sleep:      Medical Decision Making: Review of Psycho-Social Stressors (1), Review or order clinical lab tests (1) and Review of Medication Regimen & Side Effects (2)  Treatment Plan Summary: Daily contact with patient to assess and evaluate symptoms and progress in treatment, Medication management and Plan admit to inpatient psychiatry for stabilization  Plan:  Recommend psychiatric Inpatient admission when medically cleared. Disposition: admit to inpatient psychiatry for stabilization  Waylan Boga, PMH-NP 03/20/2015 11:04 AM

## 2015-03-20 NOTE — Progress Notes (Signed)
CSW referred pt to [facility] following hospitals :  [facility] [address]  [facility] [place] [place] [phone] ED CSW 03/20/2015 11:47 PM

## 2015-03-21 DIAGNOSIS — F32A Depression, unspecified: Secondary | ICD-10-CM | POA: Insufficient documentation

## 2015-03-21 DIAGNOSIS — R45851 Suicidal ideations: Secondary | ICD-10-CM | POA: Diagnosis not present

## 2015-03-21 DIAGNOSIS — F332 Major depressive disorder, recurrent severe without psychotic features: Secondary | ICD-10-CM | POA: Diagnosis not present

## 2015-03-21 DIAGNOSIS — F329 Major depressive disorder, single episode, unspecified: Secondary | ICD-10-CM | POA: Insufficient documentation

## 2015-03-21 NOTE — ED Notes (Signed)
[facility] but denies [facility] and [facility] at this time. Plan of care discuss with patient. Patient voices no complaints or concerns at this time. Encouragement and support provided and safety maintain. Q 15 min safety checks remain in place.

## 2015-03-21 NOTE — ED Notes (Signed)
C/O cocaine "withdrawal".  Requested and received antianxiety medication.

## 2015-03-21 NOTE — ED Notes (Signed)
Pt transported to [facility] by [facility] transportation service for continuation of specialized care. Belongings given to driver after patient signed for them. Pt left in no acute distress. 

## 2015-03-21 NOTE — ED Notes (Signed)
Patient complains of itching all over. Respirations equal and unlabored, skin warm and dry. No acute distress noted. Dr. Gwendolyn GrantWalden notified and new orders received and read back. Patient will be mediated per MAR. q 15 min safety checks remain in place.

## 2015-03-21 NOTE — ED Notes (Signed)
Acuity low. 

## 2015-03-21 NOTE — [facility] Counselor (Signed)
Per Amy, [facility] by Dr. Eliott [facility]ham. Nursing report [id number]. Bed assignment is not available at this time. Amy will call when patients bed is available and provide bed assignment.

## 2015-03-21 NOTE — Consult Note (Signed)
[facility] Consult   Reason for Consult:  Suicidal Referring Physician:  EDP Patient Identification: Phillip Kemp MRN:  [id number] Principal Diagnosis: Severe recurrent major depression without psychotic features Diagnosis:   Patient Active Problem List   Diagnosis Date Noted  . Alcohol abuse [F10.10] 03/20/2015  . Severe recurrent major depression without psychotic features [F33.2] 03/20/2015  . Suicidal ideations [R45.851] 03/20/2015  . Hyperthyroidism [E05.90] 11/25/2014  . Alcohol use disorder, severe, dependence [F10.20] 11/22/2014  . Thyroid disease [E07.9] 11/22/2014  . Graves' disease [E05.00] 11/22/2014  . Bipolar 1 disorder, depressed, severe [F31.4] 11/21/2014  . Aspiration pneumonia [J69.0] 07/15/2013  . Hypokalemia [E87.6] 07/15/2013  . Elevated LFTs [R79.89] 07/15/2013  . Microcytic anemia [D50.9] 07/15/2013  . Thrombocytopenia [D69.6] 07/15/2013  . 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 Adrenal nodule, left [E27.9] 07/15/2013  . Chest pain, midsternal [R07.2] 09/24/2012  . Depression [F32.9] 09/11/2012  . Papules [L98.9] 09/10/2012  . Suicidal ideation [R45.851] 09/09/2012  . Chronic blood loss anemia [D50.0] 09/09/2012  . Acute alcoholic pancreatitis [E56.3] 09/08/2012  . Rectal bleeding [K62.5] 09/08/2012  . Cocaine abuse [F14.10] 09/08/2012  . Anemia [D64.9] 09/08/2012  . Left adrenal mass [E27.9] 09/08/2012  . Pseudogout of knee [M11.869] 08/27/2011  . Substance abuse [F19.10] 08/27/2011  . Chronic pain [G89.29] 08/27/2011    Total Time spent with patient: 45 minutes  Subjective:   Phillip Kemp is a 54 y.o. male patient admitted with depression and suicidal ideations with plan to overdose.  HPI:  The patient presented to the ED after attempting to overdose on cocaine.  He states he was also drinking and using THC.  Phillip Kemp continues to endorse suicidal ideations with not feeling safe discharging.  His biggest issues are financial.  He  recently moved he stopped using [employer] because he was using over draft every month since December.  Phillip Kemp transferred his monies to [employer].  However, his SSI check did not show up on his card and Phillip Kemp said they did not receive it but SSI stated they sent it.  He has no money until next month.  He had been homeless for two days after being evicted and felt suicidal, overdosed.  Denies homicidal ideations, hallucinations.  Reviewed note above with updates.  Patient have been accepted for admission and we are looking for available inpatient Psychiatric bed for him.  Patient remains suicidal due to his living situation and feels more depressed  With out mentalhealth care.  He is disappointed hr relapsed on drugs and these drugs are being supplied by his sister.  Patient is emaciated and looking thin.  He reports poor sleep and appetite and stated that he has severe Thyroid illness.  His Thyroid gland and neck area is swollen and he has been on medications.  We will continue to monitor while we wait for bed availability.  HPI Elements:   Location:  generalized. Quality:  acute. Severity:  severe. Timing:  constant. Duration:  two days. Context:  stressors.  Past Medical History:  Past Medical History  Diagnosis Date  . Arthritis   . Substance abuse     Cocaine, marijuana, and alcohol  . Suicidal ideation 09/09/2012  . Chronic blood loss anemia 09/09/2012  . Rectal bleeding 09/08/2012  . Acute alcoholic pancreatitis 1/49/7026  . Depression   . Hyperthyroidism     Past Surgical History  Procedure Laterality Date  . Orthopedic surgery      Left foot surgery  . Left foot surgery    .  Left cataract extraction     Family History:  Family History  Problem Relation Age of Onset  . Heart disease Mother   . Cancer Father     prostate   Social History:  History  Alcohol Use  . 28.8 oz/week  . 48 Cans of beer per week     History  Drug Use  . Yes  . Special: Marijuana     History   Social History  . Marital Status: Single    Spouse Name: N/A  . Number of Children: 2  . Years of Education: 12   Occupational History  . Teaching laboratory technician.    Social History Main Topics  . Smoking status: Former Smoker -- 0.25 packs/day for 10 years    Types: Cigarettes  . Smokeless tobacco: Former Systems developer    Quit date: 09/16/2012  . Alcohol Use: 28.8 oz/week    48 Cans of beer per week  . Drug Use: Yes    Special: Marijuana  . Sexual Activity: No   Other Topics Concern  . None   Social History Narrative   Widowed.  Wife died of leukemia.  2 children.  Lives in the [place].   Additional Social History:    Prescriptions: See PTA list History of alcohol / drug use?: Yes Longest period of sobriety (when/how long): 8-9 months Negative Consequences of Use: Financial, Legal, Personal relationships, Work / School Name of Substance 1: Alcohol 1 - Age of First Use: 9 1 - Amount (size/oz): 2-40 oz beers 1 - Frequency: weekends 1 - Duration: 17 years 1 - Last Use / Amount: 2days ago Name of Substance 2: Crack cocaine 2 - Age of First Use: 36 2 - Amount (size/oz): $80 worth 2 - Frequency: DAILY 2 - Duration: 10 years 2 - Last Use / Amount: 2 days ago                 Allergies:   Allergies  Allergen Reactions  . Bee Venom Anaphylaxis    Labs:  Results for orders placed or performed during the hospital encounter of 03/20/15 (from the past 48 hour(s))  Urine Drug Screen     Status: Abnormal   Collection Time: 03/20/15  1:40 AM  Result Value Ref Range   Opiates NONE DETECTED NONE DETECTED   Cocaine POSITIVE (A) NONE DETECTED   Benzodiazepines NONE DETECTED NONE DETECTED   Amphetamines NONE DETECTED NONE DETECTED   Tetrahydrocannabinol POSITIVE (A) NONE DETECTED   Barbiturates NONE DETECTED NONE DETECTED    Comment:        DRUG SCREEN FOR MEDICAL PURPOSES ONLY.  IF CONFIRMATION IS NEEDED FOR ANY PURPOSE, NOTIFY LAB WITHIN 5 DAYS.         LOWEST DETECTABLE LIMITS FOR URINE DRUG SCREEN Drug Class       Cutoff (ng/mL) Amphetamine      1000 Barbiturate      200 Benzodiazepine   528 Tricyclics       413 Opiates          300 Cocaine          300 THC              50   Acetaminophen level     Status: Abnormal   Collection Time: 03/20/15  1:48 AM  Result Value Ref Range   Acetaminophen (Tylenol), Serum <10.0 (L) 10 - 30 ug/mL    Comment:        THERAPEUTIC CONCENTRATIONS VARY SIGNIFICANTLY. A RANGE  OF 10-30 ug/mL MAY BE AN EFFECTIVE CONCENTRATION FOR MANY PATIENTS. HOWEVER, SOME ARE BEST TREATED AT CONCENTRATIONS OUTSIDE THIS RANGE. ACETAMINOPHEN CONCENTRATIONS >150 ug/mL AT 4 HOURS AFTER INGESTION AND >50 ug/mL AT 12 HOURS AFTER INGESTION ARE OFTEN ASSOCIATED WITH TOXIC REACTIONS.   Ethanol (ETOH)     Status: Abnormal   Collection Time: 03/20/15  1:48 AM  Result Value Ref Range   Alcohol, Ethyl (B) 14 (H) 0 - 9 mg/dL    Comment:        LOWEST DETECTABLE LIMIT FOR SERUM ALCOHOL IS 11 mg/dL FOR MEDICAL PURPOSES ONLY   Salicylate level     Status: None   Collection Time: 03/20/15  1:48 AM  Result Value Ref Range   Salicylate Lvl <4.7 2.8 - 20.0 mg/dL  CBC     Status: Abnormal   Collection Time: 03/20/15  1:49 AM  Result Value Ref Range   WBC 9.0 4.0 - 10.5 K/uL   RBC 3.97 (L) 4.22 - 5.81 MIL/uL   Hemoglobin 10.2 (L) 13.0 - 17.0 g/dL   HCT 31.3 (L) 39.0 - 52.0 %   MCV 78.8 78.0 - 100.0 fL   MCH 25.7 (L) 26.0 - 34.0 pg   MCHC 32.6 30.0 - 36.0 g/dL   RDW 15.0 11.5 - 15.5 %   Platelets 109 (L) 150 - 400 K/uL    Comment: REPEATED TO VERIFY SPECIMEN CHECKED FOR CLOTS PLATELET COUNT CONFIRMED BY SMEAR   Comprehensive metabolic panel     Status: Abnormal   Collection Time: 03/20/15  1:49 AM  Result Value Ref Range   Sodium 139 135 - 145 mmol/L   Potassium 4.1 3.5 - 5.1 mmol/L   Chloride 110 96 - 112 mmol/L   CO2 19 19 - 32 mmol/L   Glucose, Bld 86 70 - 99 mg/dL   BUN 13 6 - 23 mg/dL   Creatinine,  Ser 0.55 0.50 - 1.35 mg/dL   Calcium 8.4 8.4 - 10.5 mg/dL   Total Protein 6.0 6.0 - 8.3 g/dL   Albumin 3.2 (L) 3.5 - 5.2 g/dL   AST 24 0 - 37 U/L   ALT 23 0 - 53 U/L   Alkaline Phosphatase 207 (H) 39 - 117 U/L   Total Bilirubin 0.3 0.3 - 1.2 mg/dL   GFR calc non Af Amer >90 >90 mL/min   GFR calc Af Amer >90 >90 mL/min    Comment: (NOTE) The eGFR has been calculated using the CKD EPI equation. This calculation has not been validated in all clinical situations. eGFR's persistently <90 mL/min signify possible Chronic Kidney Disease.    Anion gap 10 5 - 15    Vitals: Blood pressure 135/58, pulse 80, temperature 98.1 F (36.7 C), temperature source Oral, resp. rate 16, SpO2 99 %.  Risk to Self: Suicidal Ideation: Yes-Currently Present (earlier today) Suicidal Intent: Yes-Currently Present (earleir today) Is patient at risk for suicide?: Yes Suicidal Plan?: Yes-Currently Present (earlier today) Specify Current Suicidal Plan: cut wrist with a knife Access to Means: Yes Specify Access to Suicidal Means: knife What has been your use of drugs/alcohol within the last 12 months?: weekly use How many times?: 0 Other Self Harm Risks: denies Triggers for Past Attempts: Unknown Intentional Self Injurious Behavior: None Risk to Others: Homicidal Ideation: No (denies) Thoughts of Harm to Others: No (denies) Current Homicidal Intent: No (denies) Current Homicidal Plan: No (denies) Access to Homicidal Means: No Identified Victim: na History of harm to others?: No Assessment of Violence: None  Noted Violent Behavior Description: na Does patient have access to weapons?: Yes (Comment) (knife) Criminal Charges Pending?: No Does patient have a court date: No Prior Inpatient Therapy: Prior Inpatient Therapy: Yes Prior Therapy Dates: multiple Prior Therapy Facilty/Provider(s): [facility] Reason for Treatment: Depression Prior Outpatient Therapy: Prior Outpatient Therapy: No  Current  Facility-Administered Medications  Medication Dose Route Frequency Provider Last Rate Last Dose  . acetaminophen (TYLENOL) tablet 650 mg  650 mg Oral Q4H PRN Rolland Porter, MD      . alum & mag hydroxide-simeth (MAALOX/MYLANTA) 200-200-20 MG/5ML suspension 30 mL  30 mL Oral PRN Rolland Porter, MD      . atenolol (TENORMIN) tablet 50 mg  50 mg Oral Daily Rolland Porter, MD   50 mg at 03/21/15 1141  . ibuprofen (ADVIL,MOTRIN) tablet 600 mg  600 mg Oral Q8H PRN Rolland Porter, MD   600 mg at 03/20/15 1842  . LORazepam (ATIVAN) tablet 0-4 mg  0-4 mg Oral 4 times per day Rolland Porter, MD   1 mg at 03/20/15 1842   Followed by  . [START ON 03/22/2015] LORazepam (ATIVAN) tablet 0-4 mg  0-4 mg Oral Q12H Rolland Porter, MD      . methimazole (TAPAZOLE) tablet 10 mg  10 mg Oral BID Rolland Porter, MD   10 mg at 03/21/15 1141  . nicotine (NICODERM CQ - dosed in mg/24 hours) patch 21 mg  21 mg Transdermal Daily Rolland Porter, MD   21 mg at 03/20/15 0918  . ondansetron (ZOFRAN) tablet 4 mg  4 mg Oral Q8H PRN Rolland Porter, MD      . thiamine (VITAMIN B-1) tablet 100 mg  100 mg Oral Daily Rolland Porter, MD   100 mg at 03/21/15 1141   Or  . thiamine (B-1) injection 100 mg  100 mg Intravenous Daily Rolland Porter, MD       Current Outpatient Prescriptions  Medication Sig Dispense Refill  . atenolol (TENORMIN) 50 MG tablet Take 1 tablet (50 mg total) by mouth daily. 30 tablet 4  . methimazole (TAPAZOLE) 10 MG tablet Take 1 tablet (10 mg total) by mouth 2 (two) times daily. 60 tablet 4  . diphenhydrAMINE (BENADRYL) 25 MG tablet Take 1 tablet (25 mg total) by mouth every 6 (six) hours. (Patient not taking: Reported on 03/20/2015) 20 tablet 0  . famotidine (PEPCID) 20 MG tablet Take 1 tablet (20 mg total) by mouth 2 (two) times daily. (Patient not taking: Reported on 03/20/2015) 30 tablet 0  . HYDROcodone-acetaminophen (NORCO/VICODIN) 5-325 MG per tablet Take 1-2 tablets by mouth every 6 (six) hours as needed. (Patient not taking: Reported on 03/20/2015) 10 tablet 0  .  hydrOXYzine (ATARAX/VISTARIL) 10 MG tablet Take 1 tablet (10 mg total) by mouth 3 (three) times daily as needed. (Patient not taking: Reported on 03/20/2015) 30 tablet 0  . permethrin (ELIMITE) 5 % cream Apply to entire body other than face - let sit for 14 hours then wash off, may repeat in 1 week if still having symptoms (Patient not taking: Reported on 03/20/2015) 60 g 1  . predniSONE (DELTASONE) 20 MG tablet 2 tabs po daily x 4 days (Patient not taking: Reported on 03/20/2015) 8 tablet 0    Musculoskeletal: Strength & Muscle Tone: within normal limits Gait & Station: normal Patient leans: N/A  Psychiatric Specialty Exam:     Blood pressure 135/58, pulse 80, temperature 98.1 F (36.7 C), temperature source Oral, resp. rate 16, SpO2 99 %.There is no weight on  file to calculate BMI.  General Appearance: Casual  Eye Contact::  Good  Speech:  Normal Rate  Volume:  Normal  Mood:  Depressed  Affect:  Congruent  Thought Process:  Coherent  Orientation:  Full (Time, Place, and Person)  Thought Content:  WDL  Suicidal Thoughts:  Yes.  with intent/plan  Homicidal Thoughts:  No  Memory:  Immediate;   Fair Recent;   Fair Remote;   Fair  Judgement:  Poor  Insight:  Fair  Psychomotor Activity:  Decreased  Concentration:  Fair  Recall:  [employer] of Knowledge:Fair  Language: Good  Akathisia:  No  Handed:  Right  AIMS (if indicated):     Assets:  Leisure Time Resilience  ADL's:  Intact  Cognition: WNL  Sleep:      Medical Decision Making: Review of Psycho-Social Stressors (1), Review or order clinical lab tests (1) and Review of Medication Regimen & Side Effects (2)  Treatment Plan Summary: Daily contact with patient to assess and evaluate symptoms and progress in treatment, Medication management and Plan admit to inpatient psychiatry for stabilization  Plan:  Recommend psychiatric Inpatient admission when medically cleared. Disposition: admit to inpatient psychiatry for  stabilization  Delfin Gant, PMHNP-BC 03/21/2015 12:57 PM Patient seen face-to-face for psychiatric evaluation, chart reviewed and case discussed with the physician extender and developed treatment plan. Reviewed the information documented and agree with the treatment plan. Corena Pilgrim, MD

## 2015-03-21 NOTE — ED Notes (Signed)
Patient continues to endorse SI but denies HI and AVH. Patient calm and cooperative at this. Plan of care discuss with patient. Patient voices no complaints or concerns at this time. NAD. Q 15 min safety checks remain in place.

## 2015-06-21 ENCOUNTER — Emergency Department (HOSPITAL_COMMUNITY)
Admission: EM | Admit: 2015-06-21 | Discharge: 2015-06-22 | Disposition: A | Discharge status: Other Acute Inpatient Hospital | Payer: Medicare Other | Attending: Emergency Medicine | Admitting: Emergency Medicine

## 2015-06-21 ENCOUNTER — Encounter (HOSPITAL_COMMUNITY): Payer: Self-pay

## 2015-06-21 DIAGNOSIS — T1491XA Suicide attempt, initial encounter: Secondary | ICD-10-CM

## 2015-06-21 DIAGNOSIS — Y9289 Other specified places as the place of occurrence of the external cause: Secondary | ICD-10-CM | POA: Diagnosis not present

## 2015-06-21 DIAGNOSIS — Z862 Personal history of diseases of the blood and blood-forming organs and certain disorders involving the immune mechanism: Secondary | ICD-10-CM | POA: Diagnosis not present

## 2015-06-21 DIAGNOSIS — X58XXXA Exposure to other specified factors, initial encounter: Secondary | ICD-10-CM | POA: Insufficient documentation

## 2015-06-21 DIAGNOSIS — Z87891 Personal history of nicotine dependence: Secondary | ICD-10-CM | POA: Diagnosis not present

## 2015-06-21 DIAGNOSIS — Y998 Other external cause status: Secondary | ICD-10-CM | POA: Insufficient documentation

## 2015-06-21 DIAGNOSIS — T43592A Poisoning by other antipsychotics and neuroleptics, intentional self-harm, initial encounter: Secondary | ICD-10-CM | POA: Insufficient documentation

## 2015-06-21 DIAGNOSIS — T43212A Poisoning by selective serotonin and norepinephrine reuptake inhibitors, intentional self-harm, initial encounter: Secondary | ICD-10-CM | POA: Diagnosis not present

## 2015-06-21 DIAGNOSIS — Z8719 Personal history of other diseases of the digestive system: Secondary | ICD-10-CM | POA: Insufficient documentation

## 2015-06-21 DIAGNOSIS — M1990 Unspecified osteoarthritis, unspecified site: Secondary | ICD-10-CM | POA: Diagnosis not present

## 2015-06-21 DIAGNOSIS — Y9389 Activity, other specified: Secondary | ICD-10-CM | POA: Insufficient documentation

## 2015-06-21 DIAGNOSIS — F329 Major depressive disorder, single episode, unspecified: Secondary | ICD-10-CM | POA: Insufficient documentation

## 2015-06-21 DIAGNOSIS — T50902A Poisoning by unspecified drugs, medicaments and biological substances, intentional self-harm, initial encounter: Secondary | ICD-10-CM

## 2015-06-21 DIAGNOSIS — E0590 Thyrotoxicosis, unspecified without thyrotoxic crisis or storm: Secondary | ICD-10-CM | POA: Insufficient documentation

## 2015-06-21 DIAGNOSIS — Z79899 Other long term (current) drug therapy: Secondary | ICD-10-CM | POA: Insufficient documentation

## 2015-06-21 LAB — RAPID URINE DRUG SCREEN, HOSP PERFORMED
Amphetamines: NOT DETECTED
BARBITURATES: NOT DETECTED
Benzodiazepines: NOT DETECTED
COCAINE: NOT DETECTED
Opiates: NOT DETECTED
TETRAHYDROCANNABINOL: NOT DETECTED

## 2015-06-21 LAB — COMPREHENSIVE METABOLIC PANEL
ALBUMIN: 2.9 g/dL — AB (ref 3.5–5.0)
ALT: 17 U/L (ref 17–63)
ANION GAP: 8 (ref 5–15)
AST: 21 U/L (ref 15–41)
Alkaline Phosphatase: 167 U/L — ABNORMAL HIGH (ref 38–126)
BUN: 18 mg/dL (ref 6–20)
CO2: 22 mmol/L (ref 22–32)
Calcium: 8.8 mg/dL — ABNORMAL LOW (ref 8.9–10.3)
Chloride: 111 mmol/L (ref 101–111)
Creatinine, Ser: 0.57 mg/dL — ABNORMAL LOW (ref 0.61–1.24)
GFR calc non Af Amer: >60 mL/min (ref >60)
Glucose, Bld: 83 mg/dL (ref 65–99)
Potassium: 4.2 mmol/L (ref 3.5–5.1)
Sodium: 141 mmol/L (ref 135–145)
TOTAL PROTEIN: 6 g/dL — AB (ref 6.5–8.1)
Total Bilirubin: 1 mg/dL (ref 0.3–1.2)

## 2015-06-21 LAB — CBC WITH DIFFERENTIAL/PLATELET
BASOS PCT: 0 % (ref 0–1)
Basophils Absolute: 0 10*3/uL (ref 0.0–0.1)
EOS ABS: 0 10*3/uL (ref 0.0–0.7)
Eosinophils Relative: 0 % (ref 0–5)
HCT: 27 % — ABNORMAL LOW (ref 39.0–52.0)
HEMOGLOBIN: 8.5 g/dL — AB (ref 13.0–17.0)
Lymphocytes Relative: 17 % (ref 12–46)
Lymphs Abs: 1.5 10*3/uL (ref 0.7–4.0)
MCH: 25.4 pg — ABNORMAL LOW (ref 26.0–34.0)
MCHC: 31.5 g/dL (ref 30.0–36.0)
MCV: 80.8 fL (ref 78.0–100.0)
Monocytes Absolute: 0.6 10*3/uL (ref 0.1–1.0)
Monocytes Relative: 7 % (ref 3–12)
NEUTROS PCT: 76 % (ref 43–77)
Neutro Abs: 6.5 10*3/uL (ref 1.7–7.7)
PLATELETS: 102 10*3/uL — AB (ref 150–400)
RBC: 3.34 MIL/uL — ABNORMAL LOW (ref 4.22–5.81)
RDW: 17 % — ABNORMAL HIGH (ref 11.5–15.5)
WBC: 8.6 10*3/uL (ref 4.0–10.5)

## 2015-06-21 LAB — URINE MICROSCOPIC-ADD ON

## 2015-06-21 LAB — URINALYSIS, ROUTINE W REFLEX MICROSCOPIC
BILIRUBIN URINE: NEGATIVE
Glucose, UA: NEGATIVE mg/dL
Hgb urine dipstick: NEGATIVE
KETONES UR: NEGATIVE mg/dL
Nitrite: NEGATIVE
Protein, ur: NEGATIVE mg/dL
Specific Gravity, Urine: 1.016 (ref 1.005–1.030)
UROBILINOGEN UA: 0.2 mg/dL (ref 0.0–1.0)
pH: 5 (ref 5.0–8.0)

## 2015-06-21 LAB — TYPE AND SCREEN
ABO/RH(D): B POS
ANTIBODY SCREEN: NEGATIVE

## 2015-06-21 LAB — ACETAMINOPHEN LEVEL

## 2015-06-21 LAB — SALICYLATE LEVEL: Salicylate Lvl: <4 mg/dL (ref 2.8–30.0)

## 2015-06-21 LAB — TSH: TSH: <0.01 u[IU]/mL — ABNORMAL LOW (ref 0.350–4.500)

## 2015-06-21 LAB — TROPONIN I

## 2015-06-21 LAB — ETHANOL: Alcohol, Ethyl (B): <5 mg/dL (ref <5)

## 2015-06-21 LAB — ABO/RH: ABO/RH(D): B POS

## 2015-06-21 MED ORDER — RISPERIDONE 1 MG PO TABS: 1.0000 mg | ORAL_TABLET | Freq: Two times a day (BID) | ORAL | Status: DC

## 2015-06-21 MED ORDER — ALUM & MAG HYDROXIDE-SIMETH 200-200-20 MG/5ML PO SUSP: 30.0000 mL | ORAL | Status: DC | PRN

## 2015-06-21 MED ORDER — METHIMAZOLE 10 MG PO TABS
10.0000 mg | ORAL_TABLET | Freq: Two times a day (BID) | ORAL | Status: DC
  Filled 2015-06-21 (×2): qty 1

## 2015-06-21 MED ORDER — FAMOTIDINE 20 MG PO TABS
20.0000 mg | ORAL_TABLET | Freq: Two times a day (BID) | ORAL | Status: DC
  Administered 2015-06-22: 20 mg via ORAL
  Filled 2015-06-21: qty 1

## 2015-06-21 MED ORDER — LORAZEPAM 0.5 MG PO TABS: 1.0000 mg | ORAL_TABLET | Freq: Three times a day (TID) | ORAL | Status: DC | PRN

## 2015-06-21 MED ORDER — ATENOLOL 50 MG PO TABS
50.0000 mg | ORAL_TABLET | Freq: Every day | ORAL | Status: DC
  Filled 2015-06-21: qty 1

## 2015-06-21 MED ORDER — SODIUM CHLORIDE 0.9 % IV SOLN
1000.0000 mL | INTRAVENOUS | Status: DC
  Administered 2015-06-21: 1000 mL via INTRAVENOUS

## 2015-06-21 MED ORDER — TRAZODONE HCL 100 MG PO TABS
100.0000 mg | ORAL_TABLET | Freq: Every day | ORAL | Status: DC
  Administered 2015-06-22: 100 mg via ORAL
  Filled 2015-06-21: qty 1

## 2015-06-21 MED ORDER — SODIUM CHLORIDE 0.9 % IV SOLN
1000.0000 mL | Freq: Once | INTRAVENOUS | Status: AC
  Administered 2015-06-21: 1000 mL via INTRAVENOUS

## 2015-06-21 MED ORDER — ACETAMINOPHEN 325 MG PO TABS: 650.0000 mg | ORAL_TABLET | ORAL | Status: DC | PRN

## 2015-06-21 NOTE — ED Notes (Signed)
Olegario MessierKathy, Comptrollersitter, at bedside.

## 2015-06-21 NOTE — ED Notes (Signed)
Pt states he took an overdose of Risperdal x 10 and Trazodone x 2 today - approx 90 min ago.  Pt states he was kicked out of his home and "felt like giving up".

## 2015-06-21 NOTE — ED Provider Notes (Signed)
CSN: [id number]     Arrival date & time 06/21/15  1609 History   First MD Initiated Contact with Patient 06/21/15 1641     Chief Complaint  Patient presents with  . Drug Overdose    Risperdal x 10 & Trazodone x 2 - approx ago.     (Consider location/radiation/quality/duration/timing/severity/associated sxs/prior Treatment) HPI Patient ports that he has become very depressed and feeling that there are no reason there is no reason for him to live. He states he just wanted to give up. Patient has multiple social stressors are contributing to this. He reports that he took 10 of his respiratory and 2 trazodone approximately 90 minutes prior to arrival in an attempt to kill himself. He states at this time he is not having any active problems with pain, nausea or vomiting. He states he has been off of his medications for almost a month because they got stolen out of the department. He states he's been feeling self-conscious about his appearance due to his thyroid disease. He states to his medications being stolen he has not been able to take of her approximately a month. He states that this point that his roommate has kicked him out and he has nowhere to go. Past Medical History  Diagnosis Date  . Arthritis   . Substance abuse     Cocaine, marijuana, and alcohol  . Suicidal ideation 09/09/2012  . Chronic blood loss anemia 09/09/2012  . Rectal bleeding 09/08/2012  . Acute alcoholic pancreatitis 09/08/2012  . Depression   . Hyperthyroidism    Past Surgical History  Procedure Laterality Date  . Orthopedic surgery      Left foot surgery  . Left foot surgery    . Left cataract extraction     Family History  Problem Relation Age of Onset  . Heart disease Mother   . Cancer Father     prostate   History  Substance Use Topics  . Smoking status: Former Smoker -- 0.25 packs/day for 10 years    Types: Cigarettes  . Smokeless tobacco: Former Neurosurgeon    Quit date: 09/16/2012  . Alcohol Use: 28.8  oz/week    48 Cans of beer per week    Review of Systems   Eyes: Patient poor she's been getting redness in his left eye with matting in the mornings. This going on for several days. 10 Systems reviewed and are negative for acute change except as noted in the HPI.    Allergies  Bee venom  Home Medications   Prior to Admission medications   Medication Sig Start Date End Date Taking? Authorizing Provider  atenolol (TENORMIN) 25 MG tablet Take 25 mg by mouth daily. 03/28/15  Yes Historical Provider, MD  escitalopram (LEXAPRO) 20 MG tablet Take 20 mg by mouth daily. 03/28/15  Yes Historical Provider, MD  methimazole (TAPAZOLE) 5 MG tablet Take 5 mg by mouth 2 (two) times daily.  03/27/15 03/26/16 Yes Historical Provider, MD  risperiDONE (RISPERDAL) 1 MG tablet Take 1 mg by mouth 2 (two) times daily. 03/28/15  Yes Historical Provider, MD  traZODone (DESYREL) 100 MG tablet Take 100 mg by mouth at bedtime. 03/28/15  Yes Historical Provider, MD  atenolol (TENORMIN) 50 MG tablet Take 1 tablet (50 mg total) by mouth daily. Patient not taking: Reported on 06/21/2015 11/29/14   Ripudeep Jenna Luo, MD  diphenhydrAMINE (BENADRYL) 25 MG tablet Take 1 tablet (25 mg total) by mouth every 6 (six) hours. Patient not taking: Reported on  03/20/2015 12/30/14   Fayrene Helper, PA-C  famotidine (PEPCID) 20 MG tablet Take 1 tablet (20 mg total) by mouth 2 (two) times daily. Patient not taking: Reported on 03/20/2015 12/30/14   Fayrene Helper, PA-C  HYDROcodone-acetaminophen (NORCO/VICODIN) 5-325 MG per tablet Take 1-2 tablets by mouth every 6 (six) hours as needed. Patient not taking: Reported on 03/20/2015 01/11/15   [name], PA-C  hydrOXYzine (ATARAX/VISTARIL) 10 MG tablet Take 1 tablet (10 mg total) by mouth 3 (three) times daily as needed. Patient not taking: Reported on 03/20/2015 01/11/15   [name], PA-C  methimazole (TAPAZOLE) 10 MG tablet Take 1 tablet (10 mg total) by mouth 2 (two) times daily. Patient not taking:  Reported on 06/21/2015 11/29/14   Ripudeep Jenna Luo, MD  permethrin (ELIMITE) 5 % cream Apply to entire body other than face - let sit for 14 hours then wash off, may repeat in 1 week if still having symptoms Patient not taking: Reported on 03/20/2015 01/11/15   [name], PA-C  predniSONE (DELTASONE) 20 MG tablet 2 tabs po daily x 4 days Patient not taking: Reported on 03/20/2015 12/30/14   Fayrene Helper, PA-C   BP 130/58 mmHg  Pulse 87  Temp(Src) 97.9 F (36.6 C) (Oral)  Resp 20  Ht  (1.88 m)  Wt 125 lb (56.7 kg)  BMI 16.04 kg/m2  SpO2 100% Physical Exam  Constitutional: He is oriented to person, place, and time.  Patient is cachectic. He is alert and appropriate. No respiratory distress and normal mental status.  HENT:  Head: Normocephalic and atraumatic.  Nose: Nose normal.  Mouth/Throat: Oropharynx is clear and moist.  Eyes: EOM are normal. Pupils are equal, round, and reactive to light.  Patient has proptosis and conjunctival injection on the left. This is mild and diffuse. Extraocular motions are intact. Pupils symmetric and responsive.  Neck: Neck supple. Thyromegaly present.  Cardiovascular: Normal rate, regular rhythm, normal heart sounds and intact distal pulses.   Pulmonary/Chest: Effort normal and breath sounds normal.  Abdominal: Soft. Bowel sounds are normal. He exhibits no distension. There is no tenderness.  Musculoskeletal: He exhibits no edema.  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 Extremities are thin.  Neurological: He is alert and oriented to person, place, and time. He has normal strength. No cranial nerve deficit. Coordination normal. GCS eye subscore is 4. GCS verbal subscore is 5. GCS motor subscore is 6.  Skin: Skin is warm, dry and intact.  Psychiatric: He has a normal mood and affect.    ED Course  Procedures (including critical care time) Labs Review Labs Reviewed  COMPREHENSIVE METABOLIC  PANEL - Abnormal; Notable for the following:    Creatinine, Ser 0.57 (*)    Calcium 8.8 (*)    Total Protein 6.0 (*)    Albumin 2.9 (*)    Alkaline Phosphatase 167 (*)    All other components within normal limits  CBC WITH DIFFERENTIAL/PLATELET - Abnormal; Notable for the following:    RBC 3.34 (*)    Hemoglobin 8.5 (*)    HCT 27.0 (*)    MCH 25.4 (*)    RDW 17.0 (*)    Platelets 102 (*)    All other components within normal limits  URINALYSIS, ROUTINE W REFLEX MICROSCOPIC (NOT AT [facility]) - Abnormal; Notable for the following:    Leukocytes, UA SMALL (*)    All other components within normal limits  ACETAMINOPHEN LEVEL - Abnormal; Notable  for the following:    Acetaminophen (Tylenol), Serum <10 (*)    All other components within normal limits  TSH - Abnormal; Notable for the following:    TSH <0.010 (*)    All other components within normal limits  ETHANOL  SALICYLATE LEVEL  TROPONIN I  URINE RAPID DRUG SCREEN, HOSP PERFORMED  URINE MICROSCOPIC-ADD ON  T3 UPTAKE  TYPE AND SCREEN  ABO/RH    Imaging Review No results found.   EKG Interpretation   Date/Time:  Thursday June 21 2015 16:24:15 EDT Ventricular Rate:  106 PR Interval:  155 QRS Duration: 84 QT Interval:  336 QTC Calculation: 446 R Axis:   65 Text Interpretation:  Sinus tachycardia Biatrial enlargement Anteroseptal  infarct, old Abnrm T, consider ischemia, anterolateral lds Confirmed by  Donnald GarrePfeiffer, MD, Lebron ConnersMarcy [phone]([id number]) on 06/21/2015 5:13:50 PM      CRITICAL CARE Performed by: Arby BarrettePfeiffer, Zilda No   Total critical care time: 30  Critical care time was exclusive of separately billable procedures and treating other patients.  Critical care was necessary to treat or prevent imminent or life-threatening deterioration.  Critical care was time spent personally by me on the following activities: development of treatment plan with patient and/or surrogate as well as nursing, discussions with consultants, evaluation of  patient's response to treatment, examination of patient, obtaining history from patient or surrogate, ordering and performing treatments and interventions, ordering and review of laboratory studies, ordering and review of radiographic studies, pulse oximetry and re-evaluation of patient's condition. MDM   Final diagnoses:  Suicide attempt  Medication overdose, intentional self-harm, initial encounter  Hyperthyroidism without crisis   Patient has been monitored in the emergency department for 7 hours. He has not been symptomatic, heart rate has been normal without evidence of dysrhythmia. Patient is chronically hyperthyroid and has been noncompliant with medications for months duration. At this time he does not show any signs of acute crisis. Mental status is clear. He voices desperation and depression leading to an intentional overdose on his medications. At this time he is medically cleared for psychiatric evaluation for suicide attempt. Orders have place to continue the patient's medications which he has been missing.     Arby BarretteMarcy Caitlin Ainley, MD 06/22/15 [phone]

## 2015-06-21 NOTE — ED Notes (Signed)
Bed: WA02 Expected date:  Expected time:  Means of arrival:  Comments: EMS- Intentional Overdose

## 2015-06-21 NOTE — ED Notes (Addendum)
Contacted poison control Phillip Kemp([name]) -  Risperdal - watch for tachycardia, hypotension, sedation, prolonged QT. Trazodone - possible nausea, drowsiness, seizures (high doses only).    Recommend EKG, cardiac monitoring, IV fluids, benzos in case of agitation or seizures.  Draw 4hr tylenol level, Observe 6-8 hours or longer if symptomatic.

## 2015-06-21 NOTE — ED Notes (Signed)
Nurse currently starting IV 

## 2015-06-22 ENCOUNTER — Inpatient Hospital Stay (HOSPITAL_COMMUNITY)
Admission: AD | Admit: 2015-06-22 | Discharge: 2015-07-03 | DRG: 885 | Disposition: A | Discharge status: Home / Self Care | Payer: 59 | Source: Intra-hospital | Attending: Psychiatry | Admitting: Psychiatry

## 2015-06-22 ENCOUNTER — Encounter (HOSPITAL_COMMUNITY): Payer: Self-pay | Admitting: *Deleted

## 2015-06-22 DIAGNOSIS — E43 Unspecified severe protein-calorie malnutrition: Secondary | ICD-10-CM | POA: Diagnosis present

## 2015-06-22 DIAGNOSIS — E0500 Thyrotoxicosis with diffuse goiter without thyrotoxic crisis or storm: Secondary | ICD-10-CM | POA: Diagnosis present

## 2015-06-22 DIAGNOSIS — Z590 Homelessness: Secondary | ICD-10-CM | POA: Diagnosis not present

## 2015-06-22 DIAGNOSIS — Z8249 Family history of ischemic heart disease and other diseases of the circulatory system: Secondary | ICD-10-CM | POA: Diagnosis not present

## 2015-06-22 DIAGNOSIS — F332 Major depressive disorder, recurrent severe without psychotic features: Secondary | ICD-10-CM | POA: Diagnosis present

## 2015-06-22 DIAGNOSIS — F339 Major depressive disorder, recurrent, unspecified: Secondary | ICD-10-CM | POA: Diagnosis not present

## 2015-06-22 DIAGNOSIS — D539 Nutritional anemia, unspecified: Secondary | ICD-10-CM | POA: Diagnosis present

## 2015-06-22 DIAGNOSIS — F419 Anxiety disorder, unspecified: Secondary | ICD-10-CM | POA: Diagnosis present

## 2015-06-22 DIAGNOSIS — F314 Bipolar disorder, current episode depressed, severe, without psychotic features: Secondary | ICD-10-CM | POA: Diagnosis not present

## 2015-06-22 DIAGNOSIS — Z681 Body mass index (BMI) 19 or less, adult: Secondary | ICD-10-CM | POA: Diagnosis not present

## 2015-06-22 DIAGNOSIS — R45851 Suicidal ideations: Secondary | ICD-10-CM

## 2015-06-22 DIAGNOSIS — D696 Thrombocytopenia, unspecified: Secondary | ICD-10-CM | POA: Diagnosis present

## 2015-06-22 DIAGNOSIS — Z87891 Personal history of nicotine dependence: Secondary | ICD-10-CM | POA: Diagnosis not present

## 2015-06-22 DIAGNOSIS — T43592A Poisoning by other antipsychotics and neuroleptics, intentional self-harm, initial encounter: Secondary | ICD-10-CM | POA: Diagnosis not present

## 2015-06-22 DIAGNOSIS — R45850 Homicidal ideations: Secondary | ICD-10-CM | POA: Diagnosis present

## 2015-06-22 MED ORDER — ATENOLOL 25 MG PO TABS
25.0000 mg | ORAL_TABLET | Freq: Every day | ORAL | Status: DC
  Administered 2015-06-22 – 2015-07-03 (×12): 25 mg via ORAL
  Filled 2015-06-22 (×14): qty 1

## 2015-06-22 MED ORDER — PNEUMOCOCCAL VAC POLYVALENT 25 MCG/0.5ML IJ INJ: 0.5000 mL | INJECTION | INTRAMUSCULAR | Status: DC

## 2015-06-22 MED ORDER — HYDROXYZINE HCL 25 MG PO TABS
25.0000 mg | ORAL_TABLET | Freq: Four times a day (QID) | ORAL | Status: DC | PRN
  Administered 2015-06-24 – 2015-07-02 (×7): 25 mg via ORAL
  Filled 2015-06-22 (×4): qty 1
  Filled 2015-06-22: qty 6
  Filled 2015-06-22 (×4): qty 1

## 2015-06-22 MED ORDER — ACETAMINOPHEN 325 MG PO TABS
650.0000 mg | ORAL_TABLET | Freq: Four times a day (QID) | ORAL | Status: DC | PRN
  Administered 2015-06-23 – 2015-07-03 (×10): 650 mg via ORAL
  Filled 2015-06-22 (×10): qty 2

## 2015-06-22 MED ORDER — MAGNESIUM HYDROXIDE 400 MG/5ML PO SUSP
30.0000 mL | Freq: Every day | ORAL | Status: DC | PRN
  Administered 2015-06-28: 30 mL via ORAL
  Filled 2015-06-22: qty 30

## 2015-06-22 MED ORDER — TRAZODONE HCL 50 MG PO TABS
50.0000 mg | ORAL_TABLET | Freq: Every evening | ORAL | Status: DC | PRN
  Administered 2015-06-23: 50 mg via ORAL
  Filled 2015-06-22: qty 1

## 2015-06-22 MED ORDER — RISPERIDONE 1 MG PO TABS
1.0000 mg | ORAL_TABLET | Freq: Two times a day (BID) | ORAL | Status: DC
  Administered 2015-06-22: 1 mg via ORAL
  Filled 2015-06-22 (×5): qty 1

## 2015-06-22 MED ORDER — TRAZODONE HCL 100 MG PO TABS
100.0000 mg | ORAL_TABLET | Freq: Every day | ORAL | Status: DC
  Filled 2015-06-22 (×2): qty 1

## 2015-06-22 MED ORDER — MIRTAZAPINE 15 MG PO TABS
15.0000 mg | ORAL_TABLET | Freq: Every day | ORAL | Status: DC
  Administered 2015-06-22 – 2015-06-24 (×2): 15 mg via ORAL
  Filled 2015-06-22 (×5): qty 1

## 2015-06-22 MED ORDER — ENSURE ENLIVE PO LIQD: 237.0000 mL | Freq: Two times a day (BID) | ORAL | Status: DC

## 2015-06-22 MED ORDER — ENSURE ENLIVE PO LIQD
237.0000 mL | Freq: Two times a day (BID) | ORAL | Status: DC
  Administered 2015-06-22 – 2015-06-25 (×7): 237 mL via ORAL

## 2015-06-22 MED ORDER — ESCITALOPRAM OXALATE 20 MG PO TABS
20.0000 mg | ORAL_TABLET | Freq: Every day | ORAL | Status: DC
  Administered 2015-06-22: 20 mg via ORAL
  Filled 2015-06-22 (×3): qty 1

## 2015-06-22 MED ORDER — ALUM & MAG HYDROXIDE-SIMETH 200-200-20 MG/5ML PO SUSP
30.0000 mL | ORAL | Status: DC | PRN
  Administered 2015-06-29 – 2015-06-30 (×2): 30 mL via ORAL
  Filled 2015-06-22 (×2): qty 30

## 2015-06-22 MED ORDER — METHIMAZOLE 5 MG PO TABS
5.0000 mg | ORAL_TABLET | Freq: Two times a day (BID) | ORAL | Status: DC
  Administered 2015-06-22 – 2015-07-03 (×23): 5 mg via ORAL
  Filled 2015-06-22 (×27): qty 1

## 2015-06-22 NOTE — Progress Notes (Signed)
NUTRITION ASSESSMENT  Pt identified as at risk on the Malnutrition Screen Tool  INTERVENTION: 1. Educated patient on the importance of nutrition and encouraged intake of food and beverages. 2. Discussed weight goals. 3. Supplements: continue Ensure Enlive BID, each supplement provides 350 kcal and 20 grams of protein  NUTRITION DIAGNOSIS: Unintentional weight loss related to cancer/cancer treatment as evidenced by pt report.   Goal: Pt to meet >/= 90% of their estimated nutrition needs.  Monitor:  PO intake  Assessment:  Pt seen for MST. Pt with hx of thyroid cancer and [facility]. He reports that he has lost 67-70 lbs in the past 2 years. Per weight hx review, pt has lost 13 lbs (10% body weight) in the past 7 months.   Ensure Enlive ordered BID.  54 y.o. male  Height: Ht Readings from Last 1 Encounters:  06/22/15 6\' 2"  (1.88 m)    Weight: Wt Readings from Last 1 Encounters:  06/22/15 116 lb (52.617 kg)    Weight Hx: Wt Readings from Last 10 Encounters:  06/22/15 116 lb (52.617 kg)  06/21/15 125 lb (56.7 kg)  12/30/14 129 lb (58.514 kg)  11/27/14 115 lb 4.8 oz (52.3 kg)  11/21/14 114 lb (51.71 kg)  07/15/13 129 lb (58.514 kg)  12/05/12 131 lb (59.421 kg)  11/15/12 140 lb (63.504 kg)  10/20/12 141 lb (63.957 kg)  09/24/12 131 lb 14.4 oz (59.829 kg)    BMI:  Body mass index is 14.89 kg/(m^2). Pt meets criteria for underweight based on current BMI.  Estimated Nutritional Needs: Kcal: 25-30 kcal/kg Protein: > 1 gram protein/kg Fluid: 1 ml/kcal  Diet Order: Diet regular Room service appropriate?: Yes; Fluid consistency:: Thin Pt is also offered choice of unit snacks mid-morning and mid-afternoon.  Pt is eating as desired.   Lab results and medications reviewed.     Trenton GammonJessica Nolah Krenzer, RD, LDN Inpatient Clinical Dietitian Pager # [phone] After hours/weekend pager # [phone]

## 2015-06-22 NOTE — ED Notes (Signed)
[facility] transport requested. 

## 2015-06-22 NOTE — Consult Note (Addendum)
[facility] Medical Consultation  Phillip Kemp [id number] DOB: 1961-01-12 DOA: 06/22/2015 PCP: Willey BladeEAN, ERIC, MD   Requesting physician: Dr. Rosezella Rumpfobbos Date of consultation: 06/22/2015 Reason for consultation: hyperthyroidism   Chief Complaint: weight loss   HPI:  54 year old male with a history of hyperthyroidism [facility] 06[facility]/2015 after feeling helpless and depressed for several weeks. [facility] asked to consult for evaluation of hyperthyroidism and exophthalmos. Pt has known diagnosis of Graves disease and was on methimazole but has ran out of the medication two months ago and has been getting worse and continues to loose weight. He denies chest pain or shortness of breath, no specific abd or urinary concerns.   Assessment and Plan:  Graves disease  - with progressively worsening symptoms and likely secondary to lack of medications and consistent medical follow up - TSH <0.010 - I have seen this pt in consultation last year and have discussed his cade with Dr. Durene FruitsKerr eagle endocrinologist - pt will need to have follow up appointment scheduled and will need SW consult for assistance with medications  - agree with continuing methimazole and atenolol for now  Polysubstance abuse - Including cocaine, alcohol, tobacco but UDS this time negative and pt denies use   Bipolar disorder/substance induced mood disorder - per psych team  Severe protein calorie malnutrition in the context of chronic illness  - Provide Ensure  Underweight - Body mass index is 14.89 kg/(m^2).   Radiological Exams on Admission: [place] Soft Tissue Head/neck 12/1  Review of Systems:  Constitutional: Negative for fever, chills, diaphoresis, activity change, appetite change and fatigue.  HENT: Negative for ear pain, nosebleeds, congestion, neck pain, neck stiffness and ear discharge.   Eyes: Negative for pain, discharge, redness, itching  Respiratory: Negative for cough, choking,  wheezing and stridor.   Cardiovascular: Negative for chest pain, palpitations and leg swelling.  Gastrointestinal: Negative for abdominal distention.  Genitourinary: Negative for dysuria, urgency, frequency, hematuria, flank pain, decreased urine volume, difficulty urinating and dyspareunia.  Musculoskeletal: Negative for back pain, joint swelling, arthralgias and gait problem.  Neurological: Negative for dizziness, tremors, seizures Hematological: Negative for adenopathy. Does not bruise/bleed easily.  Psychiatric/Behavioral: Negative for hallucinations    Past Medical History  Diagnosis Date  . Arthritis   . Substance abuse     Cocaine, marijuana, and alcohol  . Suicidal ideation 09/09/2012  . Chronic blood loss anemia 09/09/2012  . Rectal bleeding 09/08/2012  . Acute alcoholic pancreatitis 09/08/2012  . Depression   . Hyperthyroidism    Past Surgical History  Procedure Laterality Date  . Orthopedic surgery      Left foot surgery  . Left foot surgery    . Left cataract extraction     Social History:  reports that he has quit smoking. His smoking use included Cigarettes. He has a 2.5 pack-year smoking history. He quit smokeless tobacco use about 2 years ago. He reports that he drinks about 28.8 oz of alcohol per week. He reports that he uses illicit drugs (Marijuana and Cocaine).  Allergies  Allergen Reactions  . Bee Venom Anaphylaxis   Family History  Problem Relation Age of Onset  . Heart disease Mother   . Cancer Father     prostate    Prior to Admission medications   Medication Sig Start Date End Date Taking? Authorizing Provider  atenolol (TENORMIN) 25 MG tablet Take 25 mg by mouth daily. 03/28/15   Historical Provider, MD  atenolol (TENORMIN) 50 MG tablet Take 1  tablet (50 mg total) by mouth daily. Patient not taking: Reported on 06/21/2015 11/29/14   Ripudeep Jenna Luo, MD  diphenhydrAMINE (BENADRYL) 25 MG tablet Take 1 tablet (25 mg total) by mouth every 6 (six)  hours. Patient not taking: Reported on 03/20/2015 12/30/14   Fayrene Helper, PA-C  escitalopram (LEXAPRO) 20 MG tablet Take 20 mg by mouth daily. 03/28/15   Historical Provider, MD  famotidine (PEPCID) 20 MG tablet Take 1 tablet (20 mg total) by mouth 2 (two) times daily. Patient not taking: Reported on 03/20/2015 12/30/14   Fayrene Helper, PA-C  HYDROcodone-acetaminophen (NORCO/VICODIN) 5-325 MG per tablet Take 1-2 tablets by mouth every 6 (six) hours as needed. Patient not taking: Reported on 03/20/2015 01/11/15   [name], PA-C  hydrOXYzine (ATARAX/VISTARIL) 10 MG tablet Take 1 tablet (10 mg total) by mouth 3 (three) times daily as needed. Patient not taking: Reported on 03/20/2015 01/11/15   [name], PA-C  methimazole (TAPAZOLE) 10 MG tablet Take 1 tablet (10 mg total) by mouth 2 (two) times daily. Patient not taking: Reported on 06/21/2015 11/29/14   Ripudeep Jenna Luo, MD  methimazole (TAPAZOLE) 5 MG tablet Take 5 mg by mouth 2 (two) times daily.  03/27/15 03/26/16  Historical Provider, MD  permethrin (ELIMITE) 5 % cream Apply to entire body other than face - let sit for 14 hours then wash off, may repeat in 1 week if still having symptoms Patient not taking: Reported on 03/20/2015 01/11/15   [name], PA-C  predniSONE (DELTASONE) 20 MG tablet 2 tabs po daily x 4 days Patient not taking: Reported on 03/20/2015 12/30/14   Fayrene Helper, PA-C  risperiDONE (RISPERDAL) 1 MG tablet Take 1 mg by mouth 2 (two) times daily. 03/28/15   Historical Provider, MD  traZODone (DESYREL) 100 MG tablet Take 100 mg by mouth at bedtime. 03/28/15   Historical Provider, MD   Physical Exam: Blood pressure 122/54, pulse 107, temperature 98.6 F (37 C), temperature source Oral, resp. rate 16, height  (1.88 m), weight 52.617 kg (116 lb). Filed Vitals:   06/22/15 0701  BP: 122/54  Pulse: 107  Temp: 98.6 F (37 C)  Resp: 16   Physical Exam  Constitutional: Appears cachectic, NAD HENT: Normocephalic. External right and  left ear normal. Dry MM Eyes: Exophthalmos  Neck: Normal ROM. Neck supple. No JVD. Thyroid enlargement  CVS: RRR, S1/S2 +, no murmurs, no gallops, no carotid bruit.  Pulmonary: Effort and breath sounds normal, no stridor, rhonchi, wheezes, rales.  Abdominal: Soft. BS +,  no distension, tenderness, rebound or guarding.  Musculoskeletal: Normal range of motion. No edema and no tenderness.  Lymphadenopathy: No lymphadenopathy noted, cervical, inguinal. Neuro: Alert. Normal reflexes, muscle tone coordination. No cranial nerve deficit. Skin: Skin is warm and dry. No rash noted. Not diaphoretic. No erythema. No pallor.  Psychiatric: Normal mood and affect.   Labs on Admission:  Basic Metabolic Panel:  Recent Labs Lab 06/21/15 1812  NA 141  K 4.2  CL 111  CO2 22  GLUCOSE 83  BUN 18  CREATININE 0.57*  CALCIUM 8.8*   Liver Function Tests:  Recent Labs Lab 06/21/15 1812  AST 21  ALT 17  ALKPHOS 167*  BILITOT 1.0  PROT 6.0*  ALBUMIN 2.9*   CBC:  Recent Labs Lab 06/21/15 1812  WBC 8.6  NEUTROABS 6.5  HGB 8.5*  HCT 27.0*  MCV 80.8  PLT 102*   Cardiac Enzymes:  Recent Labs Lab 06/21/15 1812  TROPONINI <0.03  Radiological Exams on Admission: No results found.  EKG: not done  Time spent: 50 minutes   MAGICK-Graysin Luczynski [facility] Pager [phone]  If 7PM-7AM, please contact night-coverage [email or web address] Password [facility] 06/22/2015, 4:49 PM

## 2015-06-22 NOTE — BHH Group Notes (Signed)
   [facility] Discharge Planning Group Note  06/22/2015  8:45 AM   Participation Quality: Alert, Appropriate and Oriented  Mood/Affect: Depressed and Flat  Depression Rating: 9  Anxiety Rating: 9  Thoughts of Suicide: Pt endorses  SI.  Denies HI  Will you contract for safety? Yes  Current AVH: Pt denies  Plan for Discharge/Comments: Pt attended discharge planning group and actively participated in group. CSW provided pt with today's workbook. Patient is homeless and is unsure of where he plans to go at dc.   Transportation Means: Pt reports access to transportation  Supports: No supports mentioned at this time  Samuella BruinKristin Sanjna Haskew, MSW, [employer]CSWA Clinical Social Worker [facility] [phone]

## 2015-06-22 NOTE — BHH Group Notes (Signed)
[facility] Note   06/22/2015 8:50 AM  Participation Quality:  Active  Mood/Affect:  Anxious  Depression Rating:  9  Anxiety Rating:  9.5  Thoughts of Suicide:  Yes; rates SI at 7 on scale of 1-10 Will you contract for safety?   Yes  Current AVH:  Negative  Plan for Discharge/Comments:  [facility]; [facility]  Transportation Means: Bus Voucher  Supports: Son (emotional support only) Pt [place] not [place] to contact [place] in [place]  [place], Julious PayerCatherine Campbell
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
## 2015-06-22 NOTE — BHH Counselor (Addendum)
Disposition: Per Hulan FessIjeoma Nwaeze, NP, pt meets inpt tx criteria. [facility] Dr Jama Flavorsobos.  Informed Dr Norlene Campbelltter of disposition. She will relay message to Dr Donnald GarrePfeiffer that [name], [facility] asked if statement indicating that "Per poison control, pt [facility]" could be added to her note.    Cyndie MullAnna Konstantin Lehnen, [facility]

## 2015-06-22 NOTE — BHH Group Notes (Signed)
BHH LCSW Group Therapy 06/22/2015  1:15 PM   Type of Therapy: Group Therapy  Participation Level: Did Not Attend. Patient invited to participate but declined.   Thais Silberstein, MSW, LCSWA Clinical Social Worker [facility] [phone]    

## 2015-06-22 NOTE — BHH Suicide Risk Assessment (Signed)
[facility] Admission Suicide Risk Assessment   Nursing information obtained from:  Patient Demographic factors:  Male, Divorced or widowed, Low socioeconomic status Current Mental Status:  NA Loss Factors:  Decline in physical health, Financial problems / change in socioeconomic status Historical Factors:  Prior suicide attempts Risk Reduction Factors:  Responsible for children under 55 years of age, Sense of responsibility to family, Religious beliefs about death Total Time spent with patient: 45 minutes Principal Problem:  Major Depression, Recurrent, No Psychotic Features, versus Depression secondary to Medical Illness ( Hyperthyriodism)  Diagnosis:   Patient Active Problem List   Diagnosis Date Noted  . Depressed [F32.9]   . Alcohol abuse [F10.10] 03/20/2015  . Severe recurrent major depression without psychotic features [F33.2] 03/20/2015  . Suicidal ideations [R45.851] 03/20/2015  . Hyperthyroidism [E05.90] 11/25/2014  . Alcohol use disorder, severe, dependence [F10.20] 11/22/2014  . Thyroid disease [E07.9] 11/22/2014  . Graves' disease [E05.00] 11/22/2014  . Bipolar 1 disorder, depressed, severe [F31.4] 11/21/2014  . Aspiration pneumonia [J69.0] 07/15/2013  . Hypokalemia [E87.6] 07/15/2013  . Elevated LFTs [R79.89] 07/15/2013  . Microcytic anemia [D50.9] 07/15/2013  . Thrombocytopenia [D69.6] 07/15/2013  . 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 Adrenal nodule, left [E27.9] 07/15/2013  . Chest pain, midsternal [R07.2] 09/24/2012  . Depression [F32.9] 09/11/2012  . Papules [L98.9] 09/10/2012  . Suicidal ideation [R45.851] 09/09/2012  . Chronic blood loss anemia [D50.0] 09/09/2012  . Acute alcoholic pancreatitis [K85.2] 16/09/9603  . Rectal bleeding [K62.5] 09/08/2012  . Cocaine abuse [F14.10] 09/08/2012  . Anemia [D64.9] 09/08/2012  . Left adrenal mass [E27.9] 09/08/2012  . Pseudogout of knee [M11.869] 08/27/2011  . Substance abuse [F19.10] 08/27/2011  . Chronic pain [G89.29]  08/27/2011     Continued Clinical Symptoms:  Alcohol Use Disorder Identification Test Final Score (AUDIT): 0 The "Alcohol Use Disorders Identification Test", Guidelines for Use in Primary Care, Second Edition.  World Science writer [facility]). Score between 0-7:  no or low risk or alcohol related problems. Score between 8-15:  moderate risk of alcohol related problems. Score between 16-19:  high risk of alcohol related problems. Score 20 or above:  warrants further diagnostic evaluation for alcohol dependence and treatment.   CLINICAL FACTORS:   54 year old man, who has been feeling depressed for several  Months, partly related to medical illness, health decline, severe weight loss, related to hyperthyroidism , which is currently untreated ( had stopped Tapazole 2 months ago, does not currently have a PCP or endocrinologist). Acutely worsened depression after evicted by his roommate and became homeless. Overdosed on Risperidone and Trazodone .   Musculoskeletal: Strength & Muscle Tone: within normal limits Gait & Station: normal Patient leans: N/A  Psychiatric Specialty Exam: Physical Exam  ROS  Blood pressure 122/54, pulse 107, temperature 98.6 F (37 C), temperature source Oral, resp. rate 16, height  (1.88 m), weight 116 lb (52.617 kg).Body mass index is 14.89 kg/(m^2).  See Admit Note MSE                                                        COGNITIVE FEATURES THAT CONTRIBUTE TO RISK:  Closed-mindedness and Loss of executive function    SUICIDE RISK:   Moderate:  Frequent suicidal ideation with limited intensity, and duration, some specificity in terms of plans, no associated intent, good self-control,  limited dysphoria/symptomatology, some risk factors present, and identifiable protective factors, including available and accessible social support.  PLAN OF CARE: Patient will be admitted to inpatient psychiatric unit for stabilization and  safety. Will provide and encourage milieu participation. Provide medication management and maked adjustments as needed.  Will follow daily.    Medical Decision Making:  Review of Psycho-Social Stressors (1), Review or order clinical lab tests (1), Established Problem, Worsening (2), Review of Medication Regimen & Side Effects (2) and Review of New Medication or Change in Dosage (2)  I certify that inpatient services furnished can reasonably be expected to improve the patient's condition.   COBOS, FERNANDO 06/22/2015, 2:05 PM

## 2015-06-22 NOTE — Progress Notes (Signed)
Client is a 54 yo male, admitted voluntarily attempting suicide took an OD of ten Risperdal and two Trazodone. Client feeling hopeless after being kicked out by his roommate. "My friend kick me out after I paid him $400, he said the landlord said I couldn't live there anymore." client also expressing "depression related to thyroid cancer and my looks, I used to weigh 175 lbs. Client is undergoing radiation for thyroid cancer. "tired of fighting this cancer"  Client endorses a history of substance abuse "I hadn't used in two years, since I left here" Client last reported admission was 9/13. Client is cooperative, given food/drink. Reviewed admission forms and agreed to treatment. Client oriented to unit/room. Client will be monitored q4615min for safety. Client is safe on the unit.

## 2015-06-22 NOTE — ED Notes (Signed)
Pt presents with complaint of SI, OD of Risperdal and Trazodone.  Pt has lost his home and feels like giving up on life.  Pt reports he has health issues, Schizophrenia and Thyroid Cancer, [facility].  Pt reports feeling hopeless, admits to abusing crack cocaine and alcohol.  Pt states he stopped abusing drugs and alcohol 5 mos ago.  Complaint of abd pain rates pain as 6 on pain scale.  Pt reports he has lost 67-70 lbs over past 2 years.  Admits to 1 previous SI attempt by cutting his wrist.  Pt AAO x 3, calm & cooperative, monitoring for safety, Q 15 min checks in effect.

## 2015-06-22 NOTE — Progress Notes (Signed)
D: Pt denies SI/HI/AV. Pt is pleasant and cooperative. Pt rates depression at a 7, anxiety at a 4, and Helplessness/hopelessness at a 1.  A: Pt was offered support and encouragement. Pt was given scheduled medications. Pt was encourage to attend groups. Q 15 minute checks were done for safety.  R:Pt attends groups and interacts well with peers and staff. Pt taking medication. Pt has no complaints.Pt receptive to treatment and safety maintained on unit.

## 2015-06-22 NOTE — ED Notes (Signed)
Pt signed voluntary admission paperwork. 
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
## 2015-06-22 NOTE — ED Provider Notes (Signed)
[facility]  Marisa Severinlga Eleonor Ocon, MD 06/22/15 [phone]

## 2015-06-22 NOTE — BHH Counselor (Signed)
CSW attempted to complete PSA but was unable to do so at this time. Patient lying in bed and would not wake up to speak with CSW at this time.  Samuella BruinKristin Loni Delbridge, MSW, [employer]CSWA Clinical Social Worker [facility] [phone]
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
## 2015-06-22 NOTE — ED Notes (Signed)
TTS video conference in progress.  

## 2015-06-22 NOTE — ED Notes (Signed)
Report called to RN Dois DavenportSandra, [facility], Pending [place] [facility].

## 2015-06-22 NOTE — BHH Suicide Risk Assessment (Signed)
BHH INPATIENT:  Family/Significant Other Suicide Prevention Education  Suicide Prevention Education:  Education Completed; sister Wilford GristBrenda Mcnabb [phone],  (name of family member/significant other) has been identified by the patient as the family member/significant other with whom the patient will be residing, and identified as the person(s) who will aid the patient in the event of a mental health crisis (suicidal ideations/suicide attempt).  With written consent from the patient, the family member/significant other has been provided the following suicide prevention education, prior to the and/or following the discharge of the patient.  The suicide prevention education provided includes the following:  Suicide risk factors  Suicide prevention and interventions  National Suicide Hotline telephone number  [facility] assessment telephone number  [facility] Emergency Assistance 911  [facility] and/or Residential Mobile Crisis Unit telephone number  Request made of family/significant other to:  Remove weapons (e.g., guns, rifles, knives), all items previously/currently identified as safety concern.    Remove drugs/medications (over-the-counter, prescriptions, illicit drugs), all items previously/currently identified as a safety concern.  The family member/significant other verbalizes understanding of the suicide prevention education information provided.  The family member/significant other agrees to remove the items of safety concern listed above.  Kenzel Ruesch, West CarboKristin L 06/22/2015, 5:00 PM

## 2015-06-22 NOTE — Tx Team (Signed)
Interdisciplinary Treatment Plan Update (Adult) Date: 06/22/2015   Time Reviewed: 9:30 AM  Progress in Treatment: Attending groups: Continuing to assess, patient new to milieu Participating in groups: Continuing to assess, patient new to milieu Taking medication as prescribed: Yes Tolerating medication: Yes Family/Significant other contact made: No, Continuing to assess for appropriate contacts Patient understands diagnosis: Yes Discussing patient identified problems/goals with staff: Yes Medical problems stabilized or resolved: Yes Denies suicidal/homicidal ideation: Yes Issues/concerns per patient self-inventory: Yes Other:  New problem(s) identified: N/A  Discharge Plan or Barriers:  7/8: Continuing to assess, patient new to milieu  Reason for Continuation of Hospitalization:  Depression Anxiety Medication Stabilization   Comments: N/A  Estimated length of stay: 3-5 days  For review of initial/current patient goals, please see plan of care. Patient is a 54 year old male admitted for SI with recent overdose. Patient recently kicked out of apartment by roommate and has had multiple previous admissions. Patient will benefit from crisis stabilization, medication evaluation, group therapy, and psycho education in addition to case management for discharge planning. Patient and CSW reviewed pt's identified goals and treatment plan. Pt verbalized understanding and agreed to treatment plan.   Attendees: Patient:    Family:    Physician: Dr. Jama Flavorsobos; Dr. Dub MikesLugo 06/22/2015 9:30 AM  Nursing: Mosie Epsteinreka, Sarah, Noelle RN 06/22/2015 9:30 AM  Clinical Social Worker: Belenda CruiseKristin Victorious Kundinger, LCSWA 06/22/2015 9:30 AM  Other: Ronda Fairlyatherine Harrill, LCSW 06/22/2015 9:30 AM  Other: Leisa LenzValerie Enoch, [facility] Liaison 06/22/2015 9:30 AM  Other: Onnie BoerJennifer Clark, Case Manager 06/22/2015 9:30 AM  Other: Assunta FoundShuvon Rankin, May Mendel RyderAugustin, Laura Davis NP 06/22/2015 9:30 AM  Other:          Scribe for Treatment  Team:  Samuella BruinKristin Aslynn Brunetti, MSW, [employer]CSWA [phone]

## 2015-06-23 ENCOUNTER — Encounter (HOSPITAL_COMMUNITY): Payer: Self-pay | Admitting: Registered Nurse

## 2015-06-23 DIAGNOSIS — E0500 Thyrotoxicosis with diffuse goiter without thyrotoxic crisis or storm: Secondary | ICD-10-CM

## 2015-06-23 LAB — T3 UPTAKE: T3 Uptake Ratio: 48 % — ABNORMAL HIGH (ref 24–39)

## 2015-06-23 LAB — HIV ANTIBODY (ROUTINE TESTING W REFLEX): HIV Screen 4th Generation wRfx: NONREACTIVE

## 2015-06-23 MED ORDER — PNEUMOCOCCAL VAC POLYVALENT 25 MCG/0.5ML IJ INJ: 0.5000 mL | INJECTION | Freq: Once | INTRAMUSCULAR | Status: DC

## 2015-06-23 MED ORDER — PNEUMOCOCCAL VAC POLYVALENT 25 MCG/0.5ML IJ INJ
0.5000 mL | INJECTION | INTRAMUSCULAR | Status: AC
  Administered 2015-06-25: 0.5 mL via INTRAMUSCULAR

## 2015-06-23 NOTE — Progress Notes (Signed)
D: When asked about his day pt stated, "I'm getting there. I see a little progress." Pt stated that the trazodone made him a little sleepy today, and was wondering what dose he was taking for tonight. Writer informed pt that scheduled sleep med was remeron and that the trazodone could be given if needed.   A: Pt decided to take only the remeron for sleep. Support and encouragement was offered. 15 min checks continued for safety.  R: Pt remains safe.

## 2015-06-23 NOTE — BHH Group Notes (Signed)
BHH Group Notes:  (Clinical Social Work)  06/23/2015     1:15-2:15PM  Summary of Progress/Problems:   The main focus of today's process group was to learn how to use a decisional balance exercise to move forward in the Stages of Change, which were described and discussed.  Motivational Interviewing and a worksheet and whiteboard were utilized to help patients explore in depth the perceived benefits and costs of unhealthy coping techniques, as well as the  benefits and costs of replacing that with a healthy coping skills.   The patient expressed that their unhealthy coping involves isolation, and he was very participatory throughout group, displayed some good insights.  Type of Therapy:  Group Therapy - Process   Participation Level:  Active  Participation Quality:  Attentive, Sharing and Supportive  Affect:  Depressed and Flat  Cognitive:  Alert and Appropriate  Insight:  Developing/Improving  Engagement in Therapy:  Engaged  Modes of Intervention:  Education, Motivational Interviewing  Ambrose MantleMareida Grossman-Orr, LCSW 06/23/2015, 4:54 PM

## 2015-06-23 NOTE — Progress Notes (Signed)
D: Pt denies SI/HI/AV. Pt is pleasant and cooperative. Pt rates depression at a 6, anxiety at a 6, and Helplessness/hopelessness at a 2.  A: Pt was offered support and encouragement. Pt was given scheduled medications. Pt was encourage to attend groups. Q 15 minute checks were done for safety.  R:Pt attends some groups and interacts well with peers and staff. Pt taking medication. Pt has no complaints.Pt receptive to treatment and safety maintained on unit.

## 2015-06-23 NOTE — Progress Notes (Signed)
The focus of this group is to help patients review their daily goal of treatment and discuss progress on daily workbooks. Pt attended the evening group session and responded to all discussion prompts from the Writer. Pt shared that today was a good day on the unit, the highlight of which was going to the gym with his peers. Several of his peers complimented [name] on his athletic skill and he was receptive to their praise. Pt's only request from Nursing Staff this evening was to discuss his sleep medication and he was encouraged by the Writer to direct this particular request to his nighttime nurse. Pt's affect was appropriate.

## 2015-06-23 NOTE — Progress Notes (Signed)
B[facility] Group Notes:  (Nursing/MHT/Case Management/Adjunct)  Date:  06/23/2015  Time:  12:40 AM  Type of Therapy:  Group Therapy  Participation Level:  Minimal  Participation Quality:  Appropriate  Affect:  Appropriate  Cognitive:  Appropriate  Insight:  Appropriate  Engagement in Group:  Engaged  Modes of Intervention:  Socialization and Support  Summary of Progress/Problems: Pt. Attended group and had good insight in group discussion.  Pt. Stated he had a day of "progress" and heard good news about his treatment from MD.  Sondra ComeWilson, Melanie Pellot J 06/23/2015, 12:40 AM

## 2015-06-23 NOTE — Progress Notes (Signed)
[facility]  06/23/2015 10:53 PM Phillip Kemp  MRN:  [id number]   Subjective:  Patient states "I'm doing okay.  I didn't sleep to good last night."  Objective:  Patient seen and chart reviewed.  Patient is attending and participating in group sessions.  Patient states that he does not like the Remeron.  States that he has taken Trazodone before in the past that did help him sleep but it was one of the medications that he overdosed on.  Discussed Vistaril that he could also asked for that and it may help with sleep.   Principal Problem: Major depression, recurrent, chronic Diagnosis:   Patient Active Problem List   Diagnosis Date Noted  . Major depression, recurrent, chronic [F33.9] 06/22/2015  . Depressed [F32.9]   . Alcohol abuse [F10.10] 03/20/2015  . Severe recurrent major depression without psychotic features [F33.2] 03/20/2015  . Suicidal ideations [R45.851] 03/20/2015  . Hyperthyroidism [E05.90] 11/25/2014  . Alcohol use disorder, severe, dependence [F10.20] 11/22/2014  . Thyroid disease [E07.9] 11/22/2014  . Graves' disease [E05.00] 11/22/2014  . Bipolar 1 disorder, depressed, severe [F31.4] 11/21/2014  . Aspiration pneumonia [J69.0] 07/15/2013  . Hypokalemia [E87.6] 07/15/2013  . Elevated LFTs [R79.89] 07/15/2013  . Microcytic anemia [D50.9] 07/15/2013  . Thrombocytopenia [D69.6] 07/15/2013  . 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 Adrenal nodule, left [E27.9] 07/15/2013  . Chest pain, midsternal [R07.2] 09/24/2012  . Depression [F32.9] 09/11/2012  . Papules [L98.9] 09/10/2012  . Suicidal ideation [R45.851] 09/09/2012  . Chronic blood loss anemia [D50.0] 09/09/2012  . Acute alcoholic pancreatitis [K85.2] 40/98/119109/25/2013  . Rectal bleeding [K62.5] 09/08/2012  . Cocaine abuse [F14.10] 09/08/2012  . Anemia [D64.9] 09/08/2012  . Left adrenal mass [E27.9] 09/08/2012  . Pseudogout of knee [M11.869] 08/27/2011  . Substance abuse [F19.10] 08/27/2011  . Chronic pain  [G89.29] 08/27/2011   Total Time spent with patient: 30 minutes   Past Medical History:  Past Medical History  Diagnosis Date  . Arthritis   . Substance abuse     Cocaine, marijuana, and alcohol  . Suicidal ideation 09/09/2012  . Chronic blood loss anemia 09/09/2012  . Rectal bleeding 09/08/2012  . Acute alcoholic pancreatitis 09/08/2012  . Depression   . Hyperthyroidism     Past Surgical History  Procedure Laterality Date  . Orthopedic surgery      Left foot surgery  . Left foot surgery    . Left cataract extraction     Family History:  Family History  Problem Relation Age of Onset  . Heart disease Mother   . Cancer Father     prostate   Social History:  History  Alcohol Use  . 28.8 oz/week  . 48 Cans of beer per week     History  Drug Use  . Yes  . Special: Marijuana, Cocaine    History   Social History  . Marital Status: Single    Spouse Name: N/A  . Number of Children: 2  . Years of Education: 12   Occupational History  . Quarry managerHouse manager.    Social History Main Topics  . Smoking status: Former Smoker -- 0.25 packs/day for 10 years    Types: Cigarettes  . Smokeless tobacco: Former NeurosurgeonUser    Quit date: 09/16/2012  . Alcohol Use: 28.8 oz/week    48 Cans of beer per week  . Drug Use: Yes    Special: Marijuana, Cocaine  . Sexual Activity: No   Other Topics Concern  . None  Social History Narrative   Widowed.  Wife died of leukemia.  2 children.  [place]   Additional History:    Sleep: Poor  Appetite:  Good   Assessment:   Musculoskeletal: Strength & Muscle Tone: within normal limits Gait & Station: normal Patient leans: N/A   Psychiatric Specialty Exam: Physical Exam  Constitutional: He is oriented to person, place, and time.  Neck: Normal range of motion.  Respiratory: Effort normal and breath sounds normal.  Musculoskeletal: Normal range of motion.  Neurological: He is alert and oriented to person, place,  and time.    ROS Constitutional: Positive for weight loss and malaise/fatigue.  HENT:   Mass on anterior aspect of neck- likely thyroid enlargement  Eyes: Negative.  Respiratory: Negative.   Shortness of breath upon exertion  Cardiovascular: Negative.  Gastrointestinal: Positive for nausea and blood in stool.   No rectal bleeding or melenas at this time  Genitourinary: Negative.  Musculoskeletal: Negative.  Skin: Negative.  Neurological: Positive for headaches. Negative for seizures.  Endo/Heme/Allergies: Negative.  Psychiatric/Behavioral: Positive for depression, suicidal ideas and substance abuse.   ( sober x 6 months )  all other systems negative    Blood pressure 122/54, pulse 107, temperature 98.6 F (37 C), temperature source Oral, resp. rate 16, height  (1.88 m), weight 52.617 kg (116 lb).Body mass index is 14.89 kg/(m^2).  General Appearance: Casual  Eye Contact::  Good  Speech:  Clear and Coherent and Normal Rate  Volume:  Normal  Mood:  Depressed  Affect:  Depressed  Thought Process:  Denies hallucinations, delusions, and paranoia  Orientation:  Full (Time, Place, and Person)  Thought Content:  Rumination  Suicidal Thoughts:  Denies at this time  Homicidal Thoughts:  Denies at this time  Memory:  Immediate;   Good Recent;   Good Remote;   Good  Judgement:  Fair  Insight:  Fair  Psychomotor Activity:  Normal  Concentration:  Fair  Recall:  Good  Fund of Knowledge:Good  Language: Good  Akathisia:  Negative  Handed:  Right  AIMS (if indicated):     Assets:  Communication Skills Desire for Improvement  ADL's:  Intact  Cognition: WNL  Sleep:  Number of Hours: 6.75     Current Medications: Current Facility-Administered Medications  Medication Dose Route Frequency Provider Last Rate Last Dose  . acetaminophen (TYLENOL) tablet 650 mg  650 mg Oral Q6H PRN Worthy Flank, NP   650 mg at 06/23/15 1714  . alum & mag  hydroxide-simeth (MAALOX/MYLANTA) 200-200-20 MG/5ML suspension 30 mL  30 mL Oral Q4H PRN Worthy Flank, NP      . atenolol (TENORMIN) tablet 25 mg  25 mg Oral Daily Worthy Flank, NP   25 mg at 06/23/15 0819  . feeding supplement (ENSURE ENLIVE) (ENSURE ENLIVE) liquid 237 mL  237 mL Oral BID BM Rockey Situ Cobos, MD   237 mL at 06/23/15 1328  . hydrOXYzine (ATARAX/VISTARIL) tablet 25 mg  25 mg Oral Q6H PRN Worthy Flank, NP      . magnesium hydroxide (MILK OF MAGNESIA) suspension 30 mL  30 mL Oral Daily PRN Worthy Flank, NP      . methimazole (TAPAZOLE) tablet 5 mg  5 mg Oral BID Worthy Flank, NP   5 mg at 06/23/15 1712  . mirtazapine (REMERON) tablet 15 mg  15 mg Oral QHS Craige Cotta, MD   15 mg at 06/22/15 2227  .  pneumococcal 23 valent vaccine (PNU-IMMUNE) injection 0.5 mL  0.5 mL Intramuscular Tomorrow-1000 Craige Cotta, MD      . traZODone (DESYREL) tablet 50 mg  50 mg Oral QHS PRN Craige Cotta, MD   50 mg at 06/23/15 2153    Lab Results:  Results for orders placed or performed during the hospital encounter of 06/22/15 (from the past 48 hour(s))  HIV antibody     Status: None   Collection Time: 06/23/15  6:30 AM  Result Value Ref Range   HIV Screen 4th Generation wRfx Non Reactive Non Reactive    Comment: (NOTE) Performed At: [facility] [address] [place], [place] [id number] Mila Homer MD WU:[id number] Performed at [facility]     Physical Findings: AIMS: Facial and Oral Movements Muscles of Facial Expression: None, normal Lips and Perioral Area: None, normal Jaw: None, normal Tongue: None, normal,Extremity Movements Upper (arms, wrists, hands, fingers): None, normal Lower (legs, knees, ankles, toes): None, normal, Trunk Movements Neck, shoulders, hips: None, normal, Overall Severity Severity of abnormal movements (highest score from questions above): None, normal Incapacitation due to abnormal movements: None,  normal Patient's awareness of abnormal movements (rate only patient's report): No Awareness, Dental Status Current problems with teeth and/or dentures?: Yes (caries upper mouth) Does patient usually wear dentures?: No  CIWA:  CIWA-Ar Total: 0 COWS:     Treatment Plan Summary: Daily contact with patient to assess and evaluate symptoms and progress in treatment and Medication management  1. Admit for crisis management and stabilization 2. Medication management to reduce current symptoms to bale line and improve the patient's overall level of functioning: Continue Remeron 15 mg Q hs for depression and Insomnia; which may also help improve appetite and weight   3. Treat health problems as indicated 4. Develop treatment plan to decrease risk of relapse upon discharge and the need for readmission. 5. Psycho-social education regarding relapse prevention and self care. 6. Health care follow up as needed for medical problems 7. Restart home medications where appropriate.     Medical Decision Making:  Established Problem, Stable/Improving (1), Review of Psycho-Social Stressors (1), Review or order clinical lab tests (1), Review and summation of old records (2), Review of Last Therapy Session (1), Independent Review of image, tracing or specimen (2) and Review of Medication Regimen & Side Effects (2)   Rankin, Shuvon, FNP-BC 06/23/2015, 10:53 PM

## 2015-06-23 NOTE — Consult Note (Addendum)
  [facility] Medical Consultation  Phillip Kemp JXB:[id number] DOB: 1960-12-31 DOA: 06/22/2015 PCP: Willey BladeEAN, ERIC, MD   Requesting physician: Dr. Rosezella Rumpfobbos Date of consultation: 06/22/2015 Reason for consultation: hyperthyroidism   Chief Complaint: weight loss   Subjective: - breathing well, eating well  Assessment and Plan:  Graves disease  - with progressively worsening symptoms and likely secondary to lack of medications and consistent medical follow up - TSH <0.010 - Dr. Izola PriceMyers has seen this pt in consultation last year and has discussed his cade with Dr. Durene FruitsKerr eagle endocrinologist - pt will need to have follow up appointment scheduled and will need SW consult for assistance with medications  - agree with continuing methimazole and atenolol for now - reinforced today need for compliance - needs follow up within 1 week of [facility] d/c - needs prescriptions on d/c for Atenolol 25 mg daily and Tapazole 5 mg BID  Polysubstance abuse - Including cocaine, alcohol, tobacco but UDS this time negative and pt denies use   Bipolar disorder/substance induced mood disorder - per psych team  Severe protein calorie malnutrition in the context of chronic illness  - Provide Ensure  Underweight - Body mass index is 14.89 kg/(m^2).  Anemia / thrombocytopenia - chronic, due to substance abuse / chronic medical conditions   We'll sign off, patient needs close outpatient follow up, please call with questions, thank you for this consult.    Physical Exam: Blood pressure 122/54, pulse 107, temperature 98.6 F (37 C), temperature source Oral, resp. rate 16, height 6\' 2"  (1.88 m), weight 52.617 kg (116 lb). Filed Vitals:   06/22/15 0701  BP: 122/54  Pulse: 107  Temp: 98.6 F (37 C)  Resp: 16   Physical Exam  Constitutional: Appears cachectic, NAD Eyes: Exophthalmos  Neck: Normal ROM. Neck supple. No JVD. Thyroid enlargement  CVS: RRR, S1/S2 +, no murmurs, no gallops, no carotid  bruit.  Pulmonary: Effort and breath sounds normal, no stridor, rhonchi, wheezes, rales.  Neuro: Alert. Moves all 4  Labs on Admission:  Basic Metabolic Panel:  Recent Labs Lab 06/21/15 1812  NA 141  K 4.2  CL 111  CO2 22  GLUCOSE 83  BUN 18  CREATININE 0.57*  CALCIUM 8.8*   Liver Function Tests:  Recent Labs Lab 06/21/15 1812  AST 21  ALT 17  ALKPHOS 167*  BILITOT 1.0  PROT 6.0*  ALBUMIN 2.9*   CBC:  Recent Labs Lab 06/21/15 1812  WBC 8.6  NEUTROABS 6.5  HGB 8.5*  HCT 27.0*  MCV 80.8  PLT 102*   Cardiac Enzymes:  Recent Labs Lab 06/21/15 1812  TROPONINI <0.03    Pamella PertGHERGHE, Louden Houseworth [facility] Pager [phone]  If 7PM-7AM, please contact night-coverage [email or web address] Password [email or web address] 06/23/2015, 10:31 AM

## 2015-06-23 NOTE — [facility] Group Notes (Signed)
[facility] Group Notes:  (Nursing/MHT/Case Management/Adjunct)  Date:  06/23/2015  Time:  0900  Type of Therapy:  Nurse Education  Participation Level:  Minimal  Participation Quality:  Appropriate  Affect:  Appropriate  Cognitive:  Appropriate  Insight:  Appropriate  Engagement in Group:  Limited  Modes of Intervention:  Discussion  Summary of Progress/Problems:  Bennye Almrdley, Governor Matos Violon 06/23/2015, 11:30 AM

## 2015-06-24 LAB — HEPATITIS C ANTIBODY: HCV Ab: <0.1 s/co ratio (ref 0.0–0.9)

## 2015-06-24 LAB — HEPATITIS B SURFACE ANTIGEN: Hepatitis B Surface Ag: NEGATIVE

## 2015-06-24 MED ORDER — TRAZODONE HCL 100 MG PO TABS
100.0000 mg | ORAL_TABLET | Freq: Every evening | ORAL | Status: DC | PRN
  Administered 2015-06-24 – 2015-07-02 (×8): 100 mg via ORAL
  Filled 2015-06-24 (×2): qty 1
  Filled 2015-06-24: qty 3
  Filled 2015-06-24 (×6): qty 1
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
## 2015-06-24 NOTE — Progress Notes (Signed)
D: MHT observed the pt has the pt shaved. Pt smiled as he approached the writer, touching his face and said he had to get himself together. Pt reported that it was an "so so day". When asked what we could do to make tomorrow better, pt stated that the facility was good. However, he's "wondering where he will live after discharge, because he's homeless". Encouraged pt to discuss his feelings with his provider and SW.   A:  Support and encouragement was offered. 15 min checks continued for safety.  R: Pt remains safe.

## 2015-06-24 NOTE — [facility] Group Notes (Signed)
[facility] Group Notes:  (Nursing/MHT/Case Management/Adjunct)  Date:  06/24/2015  Time:  0915 am  Type of Therapy:  Psychoeducational Skills  Participation Level:  Did Not Attend   Cranford MonBeaudry, Norlan Rann Evans 06/24/2015, 9:29 AM

## 2015-06-24 NOTE — Progress Notes (Signed)
D: Patient has increased anxiety and depression.  He states, "ya'll gotta get me some depression medication."  Patient appears anxious with flat, depressed affect.  He endorses passive SI and contracts for safety on the unit.  Patient states he has lost weight and down to 115 lbs.  Patient states he needs to have his tyroid operated on and this has caused him a lot of anxiety.  He also states this is difficult for him due to his homelessness.  He rates his depression as a 9; hopelessness as a 7; anxiety as a 9.  Patient is pleasant and vested in his treatment. A: Continue to monitor medication management and MD orders.  Safety checks completed every 15 minutes per protocol.  Offer support and encouragement as needed. R: Patient is receptive to staff.

## 2015-06-24 NOTE — [facility] Group Notes (Signed)
[facility] Group Notes:  (Clinical Social Work)  06/24/2015  1:15-2:15PM  Summary of Progress/Problems:   The main focus of today's process group was to   1)  discuss the importance of adding supports  2)  define health supports versus unhealthy supports  3)  identify the patient's current unhealthy supports and plan how to handle them  4)  Identify the patient's current healthy supports and plan what to add.  An emphasis was placed on using counselor, doctor, therapy groups, 12-step groups, and problem-specific support groups to expand supports.    The patient expressed full comprehension of the concepts presented, and agreed that there is a need to add more supports.  The patient stated that he is very interested in going to a group home or assisted living facility to be able to have a place to live, but even more than that to be able to regain some strength with assistance, not falling all the time, in order to get his needed surgery.  He expressed huge hope and was very encouraging to others re the same.  Type of Therapy:  Process Group with Motivational Interviewing  Participation Level:  Active  Participation Quality:  Attentive, Sharing and Supportive  Affect:  Blunted and Depressed  Cognitive:  Alert and Appropriate  Insight:  Engaged  Engagement in Therapy:  Engaged  Modes of Intervention:   Education, Support and Processing, Activity  Ambrose MantleMareida Grossman-Orr, LCSW 06/24/2015

## 2015-06-24 NOTE — Progress Notes (Signed)
Patient ID: Phillip Kemp, male   DOB: 11-Nov-1961, 54 y.o.   MRN: [id number] [facility] MD Progress Note  06/24/2015 2:13 PM Phillip Kemp  MRN:  [id number]   Subjective:  Patient states "Still having trouble sleeping. I am so worried about my thyroid. I am homeless. Have lost a great deal of weight recently. My anxiety is high. I just took a vistaril."   Objective:  Patient seen and chart reviewed.  Patient is attending and participating in group sessions.  Patient states that he does not like the Remeron.  States that he has taken Trazodone before in the past that did help him sleep but it was one of the medications that he overdosed on.  The patient appears very anxious during assessment today and is extremely worried about his thyroid condition. Patient was seen yesterday by Internal Medicine. He was noted to appear cachetic and has evident exophthalmos. His thyroid is also notably enlarged. His progressively worsening symptoms are felt to be likely due to consistent medical follow up. After discharge from [facility] pt will need follow up with Dr. Sharl Ma from [facility] endocrinologist.   Principal Problem: Major depression, recurrent, chronic Diagnosis:   Patient Active Problem List   Diagnosis Date Noted  . Major depression, recurrent, chronic [F33.9] 06/22/2015  . Depressed [F32.9]   . Alcohol abuse [F10.10] 03/20/2015  . Severe recurrent major depression without psychotic features [F33.2] 03/20/2015  . Suicidal ideations [R45.851] 03/20/2015  . Hyperthyroidism [E05.90] 11/25/2014  . Alcohol use disorder, severe, dependence [F10.20] 11/22/2014  . Thyroid disease [E07.9] 11/22/2014  . Graves' disease [E05.00] 11/22/2014  . Bipolar 1 disorder, depressed, severe [F31.4] 11/21/2014  . Aspiration pneumonia [J69.0] 07/15/2013  . Hypokalemia [E87.6] 07/15/2013  . Elevated LFTs [R79.89] 07/15/2013  . Microcytic anemia [D50.9] 07/15/2013  . Thrombocytopenia [D69.6] 07/15/2013  . Thyromegaly [E04.9]  07/15/2013  . Adrenal nodule, left [E27.9] 07/15/2013  . Chest pain, midsternal [R07.2] 09/24/2012  . Depression [F32.9] 09/11/2012  . Papules [L98.9] 09/10/2012  . Suicidal ideation [R45.851] 09/09/2012  . Chronic blood loss anemia [D50.0] 09/09/2012  . Acute alcoholic pancreatitis [K85.2] 08/65/7846  . Rectal bleeding [K62.5] 09/08/2012  . Cocaine abuse [F14.10] 09/08/2012  . Anemia [D64.9] 09/08/2012  . Left adrenal mass [E27.9] 09/08/2012  . Pseudogout of knee [M11.869] 08/27/2011  . Substance abuse [F19.10] 08/27/2011  . Chronic pain [G89.29] 08/27/2011   Total Time spent with patient: 30 minutes  Past Medical History:  Past Medical History  Diagnosis Date  . Arthritis   . Substance abuse     Cocaine, marijuana, and alcohol  . Suicidal ideation 09/09/2012  . Chronic blood loss anemia 09/09/2012  . Rectal bleeding 09/08/2012  . Acute alcoholic pancreatitis 09/08/2012  . Depression   . Hyperthyroidism     Past Surgical History  Procedure Laterality Date  . Orthopedic surgery      Left foot surgery  . Left foot surgery    . Left cataract extraction     Family History:  Family History  Problem Relation Age of Onset  . Heart disease Mother   . Cancer Father     prostate   Social History:  History  Alcohol Use  . 28.8 oz/week  . 48 Cans of beer per week     History  Drug Use  . Yes  . Special: Marijuana, Cocaine    History   Social History  . Marital Status: Single    Spouse Name: N/A  . Number of Children: 2  .  Years of Education: 12   Occupational History  . Quarry manager.    Social History Main Topics  . Smoking status: Former Smoker -- 0.25 packs/day for 10 years    Types: Cigarettes  . Smokeless tobacco: Former Neurosurgeon    Quit date: 09/16/2012  . Alcohol Use: 28.8 oz/week    48 Cans of beer per week  . Drug Use: Yes    Special: Marijuana, Cocaine  . Sexual Activity: No   Other Topics Concern  . None   Social History Narrative   Widowed.   Wife died of leukemia.  2 children.  [place]   Additional History:    Sleep: Poor  Appetite:  Good  Assessment:   Musculoskeletal: Strength & Muscle Tone: within normal limits Gait & Station: normal Patient leans: N/A  Psychiatric Specialty Exam: Physical Exam  Constitutional: He is oriented to person, place, and time.  Neck: Normal range of motion.  Respiratory: Effort normal and breath sounds normal.  Musculoskeletal: Normal range of motion.  Neurological: He is alert and oriented to person, place, and time.    Review of Systems  Constitutional: Positive for weight loss.  Psychiatric/Behavioral: Positive for depression and suicidal ideas. Negative for hallucinations, memory loss and substance abuse. The patient is nervous/anxious and has insomnia.    Constitutional: Positive for weight loss and malaise/fatigue.  HENT:   Mass on anterior aspect of neck- likely thyroid enlargement  Eyes: Negative.  Respiratory: Negative.   Shortness of breath upon exertion  Cardiovascular: Negative.  Gastrointestinal: Positive for nausea and blood in stool.   No rectal bleeding or melenas at this time  Genitourinary: Negative.  Musculoskeletal: Negative.  Skin: Negative.  Neurological: Positive for headaches. Negative for seizures.  Endo/Heme/Allergies: Negative.  Psychiatric/Behavioral: Positive for depression, suicidal ideas and substance abuse.   ( sober x 6 months )  all other systems negative    Blood pressure 103/63, pulse 113, temperature 98.3 F (36.8 C), temperature source Oral, resp. rate 17, height  (1.88 m), weight 52.617 kg (116 lb).Body mass index is 14.89 kg/(m^2).  General Appearance: Casual  Eye Contact::  Good  Speech:  Clear and Coherent and Normal Rate  Volume:  Normal  Mood:  Depressed  Affect:  Depressed  Thought Process:  Denies hallucinations, delusions, and paranoia  Orientation:  Full (Time,  Place, and Person)  Thought Content:  Rumination  Suicidal Thoughts:  Denies at this time  Homicidal Thoughts:  Denies at this time  Memory:  Immediate;   Good Recent;   Good Remote;   Good  Judgement:  Fair  Insight:  Fair  Psychomotor Activity:  Normal  Concentration:  Fair  Recall:  Good  Fund of Knowledge:Good  Language: Good  Akathisia:  Negative  Handed:  Right  AIMS (if indicated):     Assets:  Communication Skills Desire for Improvement  ADL's:  Intact  Cognition: WNL  Sleep:  Number of Hours: 6.75     Current Medications: Current Facility-Administered Medications  Medication Dose Route Frequency Provider Last Rate Last Dose  . acetaminophen (TYLENOL) tablet 650 mg  650 mg Oral Q6H PRN Worthy Flank, NP   650 mg at 06/23/15 1714  . alum & mag hydroxide-simeth (MAALOX/MYLANTA) 200-200-20 MG/5ML suspension 30 mL  30 mL Oral Q4H PRN Worthy Flank, NP      . atenolol (TENORMIN) tablet 25 mg  25 mg Oral Daily Worthy Flank, NP   25 mg at 06/24/15  [id number]  . feeding supplement (ENSURE ENLIVE) (ENSURE ENLIVE) liquid 237 mL  237 mL Oral BID BM Rockey SituFernando A Cobos, MD   237 mL at 06/24/15 0806  . hydrOXYzine (ATARAX/VISTARIL) tablet 25 mg  25 mg Oral Q6H PRN Worthy FlankIjeoma E Nwaeze, NP   25 mg at 06/24/15 1209  . magnesium hydroxide (MILK OF MAGNESIA) suspension 30 mL  30 mL Oral Daily PRN Worthy FlankIjeoma E Nwaeze, NP      . methimazole (TAPAZOLE) tablet 5 mg  5 mg Oral BID Worthy FlankIjeoma E Nwaeze, NP   5 mg at 06/24/15 [id number]  . mirtazapine (REMERON) tablet 15 mg  15 mg Oral QHS Craige CottaFernando A Cobos, MD   15 mg at 06/22/15 2227  . pneumococcal 23 valent vaccine (PNU-IMMUNE) injection 0.5 mL  0.5 mL Intramuscular Tomorrow-1000 Rockey SituFernando A Cobos, MD      . traZODone (DESYREL) tablet 100 mg  100 mg Oral QHS PRN Thermon LeylandLaura A Koury Roddy, NP        Lab Results:  Results for orders placed or performed during the hospital encounter of 06/22/15 (from the past 48 hour(s))  HIV antibody     Status: None   Collection Time:  06/23/15  6:30 AM  Result Value Ref Range   HIV Screen 4th Generation wRfx Non Reactive Non Reactive    Comment: (NOTE) Performed At: [facility] [address] [place], [place] [id number] Mila HomerHancock William F MD [phone] Performed at [facility]   Hepatitis C antibody     Status: None   Collection Time: 06/23/15  6:30 AM  Result Value Ref Range   HCV Ab <0.1 0.0 - 0.9 s/co ratio    Comment: (NOTE)                                  Negative:     < 0.8                             Indeterminate: 0.8 - 0.9                                  Positive:     > 0.9 The CDC recommends that a positive HCV antibody result be followed up with a HCV Nucleic Acid Amplification test ([id number]). Performed At: [facility] [place] [address] [place], [place] [id number] Mila HomerHancock William F MD [phone] Performed at [facility]   Hepatitis B surface antigen     Status: None   Collection Time: 06/23/15  6:30 AM  Result Value Ref Range   Hepatitis B Surface Ag Negative Negative    Comment: (NOTE) Performed At: [facility] [place] [address] [place], [place] [id number] Mila HomerHancock William F MD CZ:[phone] Performed at [facility]     Physical Findings: AIMS: Facial and Oral Movements Muscles of Facial Expression: None, normal Lips and Perioral Area: None, normal Jaw: None, normal Tongue: None, normal,Extremity Movements Upper (arms, wrists, hands, fingers): None, normal Lower (legs, knees, ankles, toes): None, normal, Trunk Movements Neck, shoulders, hips: None, normal, Overall Severity Severity of abnormal movements (highest score from questions above): None, normal Incapacitation due to abnormal movements: None, normal Patient's awareness of abnormal movements (rate only patient's report): No Awareness, Dental Status Current problems with teeth and/or dentures?: Yes (caries upper mouth) Does patient  usually wear  dentures?: No  CIWA:  CIWA-Ar Total: 0 COWS:     Treatment Plan Summary: Daily contact with patient to assess and evaluate symptoms and progress in treatment and Medication management  1. Continue crisis management and stabilization 2. Medication management to reduce current symptoms to bale line and improve the patient's overall level of functioning: Continue Remeron 15 mg Q hs for depression and Insomnia; which may also help improve appetite and weight, Increase Trazodone to 100 mg hs prn insomnia, Continue Vistaril 25 mg every six hours prn anxiety 3. Treat health problems as indicated. Continue treatment for hyperthyroidism per Internal Medicine to include Atenolol 25 mg daily and Tapazole 5 mg BID.  4. Develop treatment plan to decrease risk of relapse upon discharge and the need for readmission. 5. Psycho-social education regarding relapse prevention and self care. 6. Health care follow up as needed for medical problems 7. Restart home medications where appropriate.    Medical Decision Making:  Established Problem, Stable/Improving (1), Review of Psycho-Social Stressors (1), Review or order clinical lab tests (1), Review of Medication Regimen & Side Effects (2) and Review of New Medication or Change in Dosage (2)  Leonda Cristo, NP-C 06/24/2015, 2:13 PM

## 2015-06-25 MED ORDER — MIRTAZAPINE 30 MG PO TABS
30.0000 mg | ORAL_TABLET | Freq: Every day | ORAL | Status: DC
  Administered 2015-06-25 – 2015-06-28 (×3): 30 mg via ORAL
  Filled 2015-06-25 (×11): qty 1
  Filled 2015-06-25: qty 3

## 2015-06-25 MED ORDER — ADULT MULTIVITAMIN W/MINERALS CH
1.0000 | ORAL_TABLET | Freq: Every day | ORAL | Status: DC
  Administered 2015-06-25 – 2015-07-03 (×9): 1 via ORAL
  Filled 2015-06-25 (×11): qty 1

## 2015-06-25 MED ORDER — ENSURE ENLIVE PO LIQD
237.0000 mL | Freq: Three times a day (TID) | ORAL | Status: DC
  Administered 2015-06-25 – 2015-07-03 (×23): 237 mL via ORAL

## 2015-06-25 NOTE — Plan of Care (Signed)
Problem: Alteration in mood Goal: LTG-Patient reports reduction in suicidal thoughts (Patient reports reduction in suicidal thoughts and is able to verbalize a safety plan for whenever patient is feeling suicidal)  Outcome: Progressing Pt denies SI at this time   Problem: Alteration in thought process Goal: LTG-Patient behavior demonstrates decreased signs psychosis 06/22/15: Goal not met: Pt to take medication as prescribed to decrease psychosis to baseline.  Tilden Fossa, MSW, Jette Worker [facility] [phone] 06/22/2015 10:09 AM    (Patient behavior demonstrates decreased signs of psychosis to the point the patient is safe to return home and continue treatment in an outpatient setting.)  Outcome: Progressing Pt denies AVH at this time

## 2015-06-25 NOTE — [facility] Group Notes (Signed)
[facility] LCSW Group Therapy  06/25/2015 1:15pm  Type of Therapy:  Group Therapy vercoming Obstacles  Participation Level:  Active  Participation Quality:  Appropriate   Affect:  Appropriate  Cognitive:  Appropriate and Oriented  Insight:  Developing/Improving and Improving  Engagement in Therapy:  Improving  Modes of Intervention:  Discussion, Exploration, Problem-solving and Support  Description of Group:   In this group patients will be encouraged to explore what they see as obstacles to their own wellness and recovery. They will be guided to discuss their thoughts, feelings, and behaviors related to these obstacles. The group will process together ways to cope with barriers, with attention given to specific choices patients can make. Each patient will be challenged to identify changes they are motivated to make in order to overcome their obstacles. This group will be process-oriented, with patients participating in exploration of their own experiences as well as giving and receiving support and challenge from other group members.  Summary of Patient Progress: Pt participated in group discussion at the beginning identified feelings related to his obstacle of finding housing. Pt reports that he often feels depressed and hopeless in light of his obstacles and will isolate from supports. Pt described a situation related to admission when a friend helped him to problem-solve; in turn, Pt reported that he was able to overcome his obstacle and manage his emotions in a healthy way.   Therapeutic Modalities:   Cognitive Behavioral Therapy Solution Focused Therapy Motivational Interviewing Relapse Prevention Therapy   Chad CordialLauren Carter, LCSWA 06/25/2015 5:52 PM

## 2015-06-25 NOTE — Plan of Care (Signed)
Problem: Consults Goal: Depression Patient Education See Patient Education Module for education specifics.  Outcome: Progressing Nurse discussed depression/coping skills with patient.        

## 2015-06-25 NOTE — Progress Notes (Signed)
Patient ID: Phillip Kemp, male   DOB: Mar 31, 1961, 54 y.o.   MRN: [id number] [facility] MD Progress Note  06/25/2015 6:37 PM AQIB LOUGH  MRN:  [id number]   Subjective:  Patient  States " I am a little better, I feel better", but states he still feels depressed, and anxious. He does tend to ruminate about his thyroid function. He denies medication side effects.    Objective:  Patient case discussed with  Treatment team and patient seen. Patient states he is feeling better. He denies medication side effects. Currently on Tapazole and Atenolol - as recommended by hospitalist consultant, I have stressed the importance of following up with endocrinologist as outpatient and continuing to take medications as prescribed . He is still anxious and depressed, and has continued to score depression and anxiety as 8-9 /10, but compared to admission his mood seems partially improved, and he is more amenable to reassurance and support. He denies any suicidal ideations at present. Some group participation. No disruptive behaviors on unit .  Principal Problem: Major depression, recurrent, chronic Diagnosis:   Patient Active Problem List   Diagnosis Date Noted  . Major depression, recurrent, chronic [F33.9] 06/22/2015  . Depressed [F32.9]   . Alcohol abuse [F10.10] 03/20/2015  . Severe recurrent major depression without psychotic features [F33.2] 03/20/2015  . Suicidal ideations [R45.851] 03/20/2015  . Hyperthyroidism [E05.90] 11/25/2014  . Alcohol use disorder, severe, dependence [F10.20] 11/22/2014  . Thyroid disease [E07.9] 11/22/2014  . Graves' disease [E05.00] 11/22/2014  . Bipolar 1 disorder, depressed, severe [F31.4] 11/21/2014  . Aspiration pneumonia [J69.0] 07/15/2013  . Hypokalemia [E87.6] 07/15/2013  . Elevated LFTs [R79.89] 07/15/2013  . Microcytic anemia [D50.9] 07/15/2013  . Thrombocytopenia [D69.6] 07/15/2013  . 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 Adrenal nodule, left [E27.9]  07/15/2013  . Chest pain, midsternal [R07.2] 09/24/2012  . Depression [F32.9] 09/11/2012  . Papules [L98.9] 09/10/2012  . Suicidal ideation [R45.851] 09/09/2012  . Chronic blood loss anemia [D50.0] 09/09/2012  . Acute alcoholic pancreatitis [K85.2] 21/30/8657  . Rectal bleeding [K62.5] 09/08/2012  . Cocaine abuse [F14.10] 09/08/2012  . Anemia [D64.9] 09/08/2012  . Left adrenal mass [E27.9] 09/08/2012  . Pseudogout of knee [M11.869] 08/27/2011  . Substance abuse [F19.10] 08/27/2011  . Chronic pain [G89.29] 08/27/2011   Total Time spent with patient: 20 minutes  Past Medical History:  Past Medical History  Diagnosis Date  . Arthritis   . Substance abuse     Cocaine, marijuana, and alcohol  . Suicidal ideation 09/09/2012  . Chronic blood loss anemia 09/09/2012  . Rectal bleeding 09/08/2012  . Acute alcoholic pancreatitis 09/08/2012  . Depression   . Hyperthyroidism     Past Surgical History  Procedure Laterality Date  . Orthopedic surgery      Left foot surgery  . Left foot surgery    . Left cataract extraction     Family History:  Family History  Problem Relation Age of Onset  . Heart disease Mother   . Cancer Father     prostate   Social History:  History  Alcohol Use  . 28.8 oz/week  . 48 Cans of beer per week     History  Drug Use  . Yes  . Special: Marijuana, Cocaine    History   Social History  . Marital Status: Single    Spouse Name: N/A  . Number of Children: 2  . Years of Education: 12   Occupational History  . Quarry manager.    Social  History Main Topics  . Smoking status: Former Smoker -- 0.25 packs/day for 10 years    Types: Cigarettes  . Smokeless tobacco: Former Neurosurgeon    Quit date: 09/16/2012  . Alcohol Use: 28.8 oz/week    48 Cans of beer per week  . Drug Use: Yes    Special: Marijuana, Cocaine  . Sexual Activity: No   Other Topics Concern  . None   Social History Narrative   Widowed.  Wife died of leukemia.  2 children.  [place]   Additional History:    Sleep: Poor  Appetite:  Good  Assessment:   Musculoskeletal: Strength & Muscle Tone: within normal limits Gait & Station: normal Patient leans: N/A  Psychiatric Specialty Exam: Physical Exam  Constitutional: He is oriented to person, place, and time.  Neck: Normal range of motion.  Respiratory: Effort normal and breath sounds normal.  Musculoskeletal: Normal range of motion.  Neurological: He is alert and oriented to person, place, and time.    Review of Systems  Constitutional: Positive for weight loss.  Psychiatric/Behavioral: Positive for depression and suicidal ideas. Negative for hallucinations, memory loss and substance abuse. The patient is nervous/anxious and has insomnia.    Constitutional: Positive for weight loss and malaise/fatigue.  HENT:   Mass on anterior aspect of neck- likely thyroid enlargement  Eyes: Negative.  Respiratory: Negative.   Shortness of breath upon exertion  Cardiovascular: Negative.  Gastrointestinal: Positive for nausea and blood in stool.   No rectal bleeding or melenas at this time  Genitourinary: Negative.  Musculoskeletal: Negative.  Skin: Negative.  Neurological: Positive for headaches. Negative for seizures.  Endo/Heme/Allergies: Negative.  Psychiatric/Behavioral: Positive for depression, suicidal ideas and substance abuse.   ( sober x 6 months )  all other systems negative    Blood pressure 110/53, pulse 116, temperature 98.2 F (36.8 C), temperature source Oral, resp. rate 16, height 6\' 2"  (1.88 m), weight 116 lb (52.617 kg).Body mass index is 14.89 kg/(m^2).  General Appearance: Fairly Groomed  Patent attorney::  Good  Speech:  Clear and Coherent and Normal Rate  Volume:  Normal  Mood:  Depressed  Affect:   Improved compared to admission, increased range of affect   Thought Process:  Denies hallucinations, delusions, and paranoia  Orientation:   Full (Time, Place, and Person)  Thought Content:  Rumination about physical health   Suicidal Thoughts:  Denies at this time  Homicidal Thoughts:  Denies at this time  Memory:  Immediate;   Good Recent;   Good Remote;   Good  Judgement:  Fair  Insight:  Fair  Psychomotor Activity:  Normal  Concentration:  Fair  Recall:  Good  Fund of Knowledge:Good  Language: Good  Akathisia:  Negative  Handed:  Right  AIMS (if indicated):     Assets:  Communication Skills Desire for Improvement  ADL's:  Intact  Cognition: WNL  Sleep:  Number of Hours: 5     Current Medications: Current Facility-Administered Medications  Medication Dose Route Frequency Provider Last Rate Last Dose  . acetaminophen (TYLENOL) tablet 650 mg  650 mg Oral Q6H PRN Worthy Flank, NP   650 mg at 06/25/15 0832  . alum & mag hydroxide-simeth (MAALOX/MYLANTA) 200-200-20 MG/5ML suspension 30 mL  30 mL Oral Q4H PRN Worthy Flank, NP      . atenolol (TENORMIN) tablet 25 mg  25 mg Oral Daily Worthy Flank, NP   25 mg at 06/25/15 0825  .  feeding supplement (ENSURE ENLIVE) (ENSURE ENLIVE) liquid 237 mL  237 mL Oral BID BM Rockey SituFernando A Adama Ivins, MD   237 mL at 06/25/15 1435  . hydrOXYzine (ATARAX/VISTARIL) tablet 25 mg  25 mg Oral Q6H PRN Worthy FlankIjeoma E Nwaeze, NP   25 mg at 06/25/15 0831  . magnesium hydroxide (MILK OF MAGNESIA) suspension 30 mL  30 mL Oral Daily PRN Worthy FlankIjeoma E Nwaeze, NP      . methimazole (TAPAZOLE) tablet 5 mg  5 mg Oral BID Worthy FlankIjeoma E Nwaeze, NP   5 mg at 06/25/15 1624  . mirtazapine (REMERON) tablet 15 mg  15 mg Oral QHS Craige CottaFernando A Demarco Bacci, MD   15 mg at 06/24/15 2232  . multivitamin with minerals tablet 1 tablet  1 tablet Oral Daily Craige CottaFernando A Caulin Begley, MD   1 tablet at 06/25/15 1623  . traZODone (DESYREL) tablet 100 mg  100 mg Oral QHS PRN Thermon LeylandLaura A Davis, NP   100 mg at 06/24/15 2232    Lab Results:  No results found for this or any previous visit (from the past 48 hour(s)).  Physical Findings: AIMS: Facial and  Oral Movements Muscles of Facial Expression: None, normal Lips and Perioral Area: None, normal Jaw: None, normal Tongue: None, normal,Extremity Movements Upper (arms, wrists, hands, fingers): None, normal Lower (legs, knees, ankles, toes): None, normal, Trunk Movements Neck, shoulders, hips: None, normal, Overall Severity Severity of abnormal movements (highest score from questions above): None, normal Incapacitation due to abnormal movements: None, normal Patient's awareness of abnormal movements (rate only patient's report): No Awareness, Dental Status Current problems with teeth and/or dentures?: Yes Does patient usually wear dentures?: No  CIWA:  CIWA-Ar Total: 1 COWS:  COWS Total Score: 3   Assessment- patient remains depressed and anxious, but affect seems improved . He is tolerating medications well, denies side effects. He is gaining insight regarding his hyperthyroidism and importance of ongoing treatment and medication compliance .  Anxiety is present but he seems more amenable to reassurance and suppport.   Treatment Plan Summary: Daily contact with patient to assess and evaluate symptoms and progress in treatment and Medication management  1. Continue crisis management and stabilization 2. Increase Remeron to 30 mgs QHS  for depression and Insomnia, Remeron may also help anxiety. 3. Continue  Atenolol 25 mg daily and Tapazole 5 mg BID- to address hyperthyroidism .  4. Continue  Ensure Supplement to improve nutritional status . 5. Weigh patient in AM to document /monitor weight gain in the context of medication and diet supplementation. 6. Continue Trazodone  100 mgrs QHS PRN Insomnia .  Medical Decision Making:  Established Problem, Stable/Improving (1), Review of Psycho-Social Stressors (1), Review or order clinical lab tests (1), Review of Medication Regimen & Side Effects (2) and Review of New Medication or Change in Dosage (2)  Nehemiah MassedOBOS, Rasa Degrazia, MD  06/25/2015, 6:37  PM

## 2015-06-25 NOTE — Progress Notes (Signed)
D: Pt denies SI/HI/AVH. Pt is pleasant and cooperative. Pt concerned about weather he should have the surgery and how can he gain weight.   A: Pt was offered support and encouragement. Pt was given scheduled medications. Pt was encourage to attend groups. Q 15 minute checks were done for safety.   R:Pt attends groups and interacts well with peers and staff. Pt is taking medication. Pt has no complaints at this time .Pt receptive to treatment and safety maintained on unit.

## 2015-06-25 NOTE — Progress Notes (Signed)
D:  Patient's self inventory sheet, patient slept good last night, sleep medication is helpful.  Fair appetite, low energy level, poor concentration.  Rated depression 9, hopeless 8, anxiety 9.  Denied withdrawals.  SI, contracts for safety.  Pain, headaches, back, worst pain in past 24 hours #8, pain medication is helpful.  Goal is to get help for thyroid.  Plans to talk to MD.  Just wants to get help.  Patient is homeless. A:  Medications administered per MD orders.  Emotional support and encouragement given patient. R:  Denied SI and HI, contracts for safety while talking to nurse this morning.  Denied A/V hallucinations.  Safety maintained with 15 minute checks.

## 2015-06-25 NOTE — [facility] Group Notes (Signed)
[facility] LCSW Aftercare Discharge Planning Group Note  06/25/2015 8:45 AM  Participation Quality: Alert, Appropriate and Oriented  Mood/Affect: Appropriate  Depression Rating: 8  Anxiety Rating: 8  Thoughts of Suicide: Pt denies SI/HI  Will you contract for safety? Yes  Current AVH: Pt denies  Plan for Discharge/Comments: Pt attended discharge planning group and actively participated in group. CSW discussed suicide prevention education with the group and encouraged them to discuss discharge planning and any relevant barriers. Pt reports that he is concerned regarding housing and he needs a referral for a psychiatrist. He reports feeling anxious and depressed today.  Transportation Means: Pt reports access to transportation  Supports: No supports mentioned at this time  Chad CordialLauren Carter, LCSWA 06/25/2015 5:06 PM

## 2015-06-25 NOTE — Progress Notes (Signed)
D: Pt informed the writer that he's anemic, and that he "stays cold".  Stated, "that's my only concern".  Stated his plans for discharge is to go to an [facility]. States that's not where he really wants to go, but is willing to go for the time being.  A:  Support and encouragement was offered. 15 min checks continued for safety.  R: Pt remains safe.

## 2015-06-25 NOTE — Progress Notes (Signed)
Recreation Therapy Notes  Date: 07.11.16 Time: 9:30 am Location: [address] Group Room  Group Topic: Stress Management  Goal Area(s) Addresses:  Patient will verbalize importance of using healthy stress management.  Patient will identify positive emotions associated with healthy stress management.   Intervention: Stress Management  Activity :  Progressive Muscle Relaxation.  LRT introduced and educated patients on stress management technique of progressive muscle relaxation.  A script was used to deliver both techniques to patients.  Patients were asked to follow the script read a loud by the LRT to engage in practicing the technique.  Education:  Stress Management, Discharge Planning.   Education Outcome: Acknowledges edcuation/In group clarification offered/Needs additional education  Clinical Observations/Feedback: Patient did not attend group.    Amedee Cerrone, LRT/CTRS         Gladyce Mcray A 06/25/2015 3:43 PM 

## 2015-06-26 LAB — BASIC METABOLIC PANEL
ANION GAP: 8 (ref 5–15)
BUN: 20 mg/dL (ref 6–20)
CO2: 24 mmol/L (ref 22–32)
Calcium: 9.1 mg/dL (ref 8.9–10.3)
Chloride: 105 mmol/L (ref 101–111)
Creatinine, Ser: 0.5 mg/dL — ABNORMAL LOW (ref 0.61–1.24)
GFR calc Af Amer: >60 mL/min (ref >60)
GFR calc non Af Amer: >60 mL/min (ref >60)
Glucose, Bld: 95 mg/dL (ref 65–99)
Potassium: 4.9 mmol/L (ref 3.5–5.1)
SODIUM: 137 mmol/L (ref 135–145)

## 2015-06-26 LAB — CBC WITH DIFFERENTIAL/PLATELET
BASOS ABS: 0 10*3/uL (ref 0.0–0.1)
Basophils Relative: 0 % (ref 0–1)
EOS ABS: 0.3 10*3/uL (ref 0.0–0.7)
Eosinophils Relative: 3 % (ref 0–5)
HCT: 28.5 % — ABNORMAL LOW (ref 39.0–52.0)
HEMOGLOBIN: 9.1 g/dL — AB (ref 13.0–17.0)
LYMPHS PCT: 21 % (ref 12–46)
Lymphs Abs: 2.3 10*3/uL (ref 0.7–4.0)
MCH: 26.4 pg (ref 26.0–34.0)
MCHC: 31.9 g/dL (ref 30.0–36.0)
MCV: 82.6 fL (ref 78.0–100.0)
Monocytes Absolute: 1 10*3/uL (ref 0.1–1.0)
Monocytes Relative: 10 % (ref 3–12)
NEUTROS PCT: 66 % (ref 43–77)
Neutro Abs: 7.4 10*3/uL (ref 1.7–7.7)
Platelets: 144 10*3/uL — ABNORMAL LOW (ref 150–400)
RBC: 3.45 MIL/uL — ABNORMAL LOW (ref 4.22–5.81)
RDW: 16.5 % — AB (ref 11.5–15.5)
WBC: 11 10*3/uL — AB (ref 4.0–10.5)

## 2015-06-26 NOTE — Progress Notes (Signed)
Patient's weight is 112 lbs this afternoon.

## 2015-06-26 NOTE — Progress Notes (Signed)
D: Pt informed the writer that he experienced some "suicidal thoughts" today. Stated he also had thoughts of "harming the man that kept his stuff". Stated he contacted "the man" today and didn't "appreciate the way he spoke to him". Stated he was informed that he wouldn't be given any of his items or money back. Stated when he's discharged he plans to have the police accompany him to the apt and get whatever he can find that belongs to him.  A:  Support and encouragement was offered. 15 min checks continued for safety.  R: Pt remains safe.

## 2015-06-26 NOTE — Progress Notes (Signed)
The focus of this group is to help patients review their daily goal of treatment and discuss progress on daily workbooks. Pt attended the evening group session and responded to all discussion prompts from the Writer. Pt shared that today was a good day on the unit, the highlight of which was physically feeling better than he did upon waking this morning. "My Nurse took really good care of me today." Pt's only additional request from Nursing Staff this evening was to be allowed to do laundry, which was taken care of following group. Pt's affect was appropriate and he volunteered several encouraging comments to his peers during wrap-up.

## 2015-06-26 NOTE — Progress Notes (Signed)
Patient ID: Phillip Kemp, male   DOB: 01-13-1961, 54 y.o.   MRN: [id number] [facility] MD Progress Note  06/26/2015 3:49 PM Phillip Kemp  MRN:  [id number]   Subjective:  Patient  States " I am still anxious. About my thyroid and living situation, but states he still feels depressed, and anxious. He does tend to ruminate about his thyroid function. He denies medication side effects.   Objective:  Patient case discussed with  Treatment team and patient seen. Patient states he is feeling better.He denies medication side effects. Currently on Tapazole and Atenolol - as recommended by hospitalist consultant, patient understands the importance of following up with endocrinologist as outpatient and continuing to take medications as prescribed . He is still anxious and depressed, but compared to admission his mood seems partially improved, and he is more amenable to reassurance and support.He denies any suicidal ideations at present. Some group participation. No disruptive behaviors on unit. Patient plans to stay at a homeless shelter temporarily until his monthly check is deposited. Patietn will follow-up with [facility].  Principal Problem: Major depression, recurrent, chronic Diagnosis:   Patient Active Problem List   Diagnosis Date Noted  . Major depression, recurrent, chronic [F33.9] 06/22/2015  . Depressed [F32.9]   . Alcohol abuse [F10.10] 03/20/2015  . Severe recurrent major depression without psychotic features [F33.2] 03/20/2015  . Suicidal ideations [R45.851] 03/20/2015  . Hyperthyroidism [E05.90] 11/25/2014  . Alcohol use disorder, severe, dependence [F10.20] 11/22/2014  . Thyroid disease [E07.9] 11/22/2014  . Graves' disease [E05.00] 11/22/2014  . Bipolar 1 disorder, depressed, severe [F31.4] 11/21/2014  . Aspiration pneumonia [J69.0] 07/15/2013  . Hypokalemia [E87.6] 07/15/2013  . Elevated LFTs [R79.89] 07/15/2013  . Microcytic anemia [D50.9] 07/15/2013  .  Thrombocytopenia [D69.6] 07/15/2013  . 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 Adrenal nodule, left [E27.9] 07/15/2013  . Chest pain, midsternal [R07.2] 09/24/2012  . Depression [F32.9] 09/11/2012  . Papules [L98.9] 09/10/2012  . Suicidal ideation [R45.851] 09/09/2012  . Chronic blood loss anemia [D50.0] 09/09/2012  . Acute alcoholic pancreatitis [D22.0] 09/08/2012  . Rectal bleeding [K62.5] 09/08/2012  . Cocaine abuse [F14.10] 09/08/2012  . Anemia [D64.9] 09/08/2012  . Left adrenal mass [E27.9] 09/08/2012  . Pseudogout of knee [M11.869] 08/27/2011  . Substance abuse [F19.10] 08/27/2011  . Chronic pain [G89.29] 08/27/2011   Total Time spent with patient: 15 minutes  Past Medical History:  Past Medical History  Diagnosis Date  . Arthritis   . Substance abuse     Cocaine, marijuana, and alcohol  . Suicidal ideation 09/09/2012  . Chronic blood loss anemia 09/09/2012  . Rectal bleeding 09/08/2012  . Acute alcoholic pancreatitis 2/54/2706  . Depression   . Hyperthyroidism     Past Surgical History  Procedure Laterality Date  . Orthopedic surgery      Left foot surgery  . Left foot surgery    . Left cataract extraction     Family History:  Family History  Problem Relation Age of Onset  . Heart disease Mother   . Cancer Father     prostate   Social History:  History  Alcohol Use  . 28.8 oz/week  . 48 Cans of beer per week     History  Drug Use  . Yes  . Special: Marijuana, Cocaine    History   Social History  . Marital Status: Single    Spouse Name: N/A  . Number of Children: 2  . Years of Education: 12   Occupational History  .  Teaching laboratory technician.    Social History Main Topics  . Smoking status: Former Smoker -- 0.25 packs/day for 10 years    Types: Cigarettes  . Smokeless tobacco: Former Systems developer    Quit date: 09/16/2012  . Alcohol Use: 28.8 oz/week    48 Cans of beer per week  . Drug Use: Yes    Special: Marijuana, Cocaine  . Sexual Activity: No   Other Topics  Concern  . None   Social History Narrative   Widowed.  Wife died of leukemia.  2 children.  Lives in the [place].   Additional History:    Sleep: Fair  Appetite:  Good  Musculoskeletal: Strength & Muscle Tone: within normal limits Gait & Station: normal Patient leans: N/A  Psychiatric Specialty Exam: Physical Exam  Constitutional: He is oriented to person, place, and time.  Neck: Normal range of motion.  Respiratory: Effort normal and breath sounds normal.  Musculoskeletal: Normal range of motion.  Neurological: He is alert and oriented to person, place, and time.    Review of Systems  Constitutional: Positive for weight loss.  HENT: Negative.   Eyes: Negative.   Respiratory: Negative.   Cardiovascular: Negative.   Gastrointestinal: Negative.   Genitourinary: Negative.   Musculoskeletal: Negative.   Skin: Negative.   Neurological: Negative.   Endo/Heme/Allergies: Negative.   Psychiatric/Behavioral: Positive for depression and suicidal ideas. Negative for hallucinations, memory loss and substance abuse. The patient is nervous/anxious and has insomnia.    Constitutional: Positive for weight loss and malaise/fatigue.  HENT:   Mass on anterior aspect of neck- likely thyroid enlargement  Eyes: Negative.  Respiratory: Negative.   Shortness of breath upon exertion  Cardiovascular: Negative.  Gastrointestinal: Positive for nausea and blood in stool.   No rectal bleeding or melenas at this time  Genitourinary: Negative.  Musculoskeletal: Negative.  Skin: Negative.  Neurological: Positive for headaches. Negative for seizures.  Endo/Heme/Allergies: Negative.  Psychiatric/Behavioral: Positive for depression, suicidal ideas and substance abuse.   ( sober x 6 months )  all other systems negative    Blood pressure 117/50, pulse 79, temperature 98.4 F (36.9 C), temperature source Oral, resp. rate 18, height '6\' 2"'  (1.88 m), weight  50.803 kg (112 lb).Body mass index is 14.37 kg/(m^2).  General Appearance: Fairly Groomed  Engineer, water::  Good  Speech:  Clear and Coherent and Normal Rate  Volume:  Normal  Mood:  Depressed  Affect:  Anxious   Thought Process:  Denies hallucinations, delusions, and paranoia  Orientation:  Full (Time, Place, and Person)  Thought Content:  Rumination about physical health   Suicidal Thoughts:  Denies at this time  Homicidal Thoughts:  Denies at this time  Memory:  Immediate;   Good Recent;   Good Remote;   Good  Judgement:  Fair  Insight:  Fair  Psychomotor Activity:  Normal  Concentration:  Fair  Recall:  Good  Fund of Knowledge:Good  Language: Good  Akathisia:  Negative  Handed:  Right  AIMS (if indicated):     Assets:  Communication Skills Desire for Improvement Resilience  ADL's:  Intact  Cognition: WNL  Sleep:  Number of Hours: 5   Current Medications: Current Facility-Administered Medications  Medication Dose Route Frequency Provider Last Rate Last Dose  . acetaminophen (TYLENOL) tablet 650 mg  650 mg Oral Q6H PRN Harriet Butte, NP   650 mg at 06/26/15 0640  . alum & mag hydroxide-simeth (MAALOX/MYLANTA) 200-200-20 MG/5ML suspension 30 mL  30 mL Oral  Q4H PRN Harriet Butte, NP      . atenolol (TENORMIN) tablet 25 mg  25 mg Oral Daily Harriet Butte, NP   25 mg at 06/26/15 0841  . feeding supplement (ENSURE ENLIVE) (ENSURE ENLIVE) liquid 237 mL  237 mL Oral TID BM Maurine Minister Simon, PA-C   237 mL at 06/26/15 1434  . hydrOXYzine (ATARAX/VISTARIL) tablet 25 mg  25 mg Oral Q6H PRN Harriet Butte, NP   25 mg at 06/25/15 0831  . magnesium hydroxide (MILK OF MAGNESIA) suspension 30 mL  30 mL Oral Daily PRN Harriet Butte, NP      . methimazole (TAPAZOLE) tablet 5 mg  5 mg Oral BID Harriet Butte, NP   5 mg at 06/26/15 0841  . mirtazapine (REMERON) tablet 30 mg  30 mg Oral QHS Jenne Campus, MD   30 mg at 06/25/15 2234  . multivitamin with minerals tablet 1 tablet  1  tablet Oral Daily Jenne Campus, MD   1 tablet at 06/26/15 0840  . traZODone (DESYREL) tablet 100 mg  100 mg Oral QHS PRN Niel Hummer, NP   100 mg at 06/25/15 2236    Lab Results:  Results for orders placed or performed during the hospital encounter of 06/22/15 (from the past 48 hour(s))  CBC with Differential/Platelet     Status: Abnormal   Collection Time: 06/26/15  6:24 AM  Result Value Ref Range   WBC 11.0 (H) 4.0 - 10.5 K/uL   RBC 3.45 (L) 4.22 - 5.81 MIL/uL   Hemoglobin 9.1 (L) 13.0 - 17.0 g/dL   HCT 28.5 (L) 39.0 - 52.0 %   MCV 82.6 78.0 - 100.0 fL   MCH 26.4 26.0 - 34.0 pg   MCHC 31.9 30.0 - 36.0 g/dL   RDW 16.5 (H) 11.5 - 15.5 %   Platelets 144 (L) 150 - 400 K/uL   Neutrophils Relative % 66 43 - 77 %   Neutro Abs 7.4 1.7 - 7.7 K/uL   Lymphocytes Relative 21 12 - 46 %   Lymphs Abs 2.3 0.7 - 4.0 K/uL   Monocytes Relative 10 3 - 12 %   Monocytes Absolute 1.0 0.1 - 1.0 K/uL   Eosinophils Relative 3 0 - 5 %   Eosinophils Absolute 0.3 0.0 - 0.7 K/uL   Basophils Relative 0 0 - 1 %   Basophils Absolute 0.0 0.0 - 0.1 K/uL    Comment: Performed at [place] metabolic panel     Status: Abnormal   Collection Time: 06/26/15  6:24 AM  Result Value Ref Range   Sodium 137 135 - 145 mmol/L   Potassium 4.9 3.5 - 5.1 mmol/L   Chloride 105 101 - 111 mmol/L   CO2 24 22 - 32 mmol/L   Glucose, Bld 95 65 - 99 mg/dL   BUN 20 6 - 20 mg/dL   Creatinine, Ser 0.50 (L) 0.61 - 1.24 mg/dL   Calcium 9.1 8.9 - 10.3 mg/dL   GFR calc non Af Amer >60 >60 mL/min   GFR calc Af Amer >60 >60 mL/min    Comment: (NOTE) The eGFR has been calculated using the CKD EPI equation. This calculation has not been validated in all clinical situations. eGFR's persistently <60 mL/min signify possible Chronic Kidney Disease.    Anion gap 8 5 - 15    Comment: Performed at [facility]    Physical Findings: AIMS: Facial and Oral  Movements Muscles of Facial  Expression: None, normal Lips and Perioral Area: None, normal Jaw: None, normal Tongue: None, normal,Extremity Movements Upper (arms, wrists, hands, fingers): None, normal Lower (legs, knees, ankles, toes): None, normal, Trunk Movements Neck, shoulders, hips: None, normal, Overall Severity Severity of abnormal movements (highest score from questions above): None, normal Incapacitation due to abnormal movements: None, normal Patient's awareness of abnormal movements (rate only patient's report): No Awareness, Dental Status Current problems with teeth and/or dentures?: Yes Does patient usually wear dentures?: No  CIWA:  CIWA-Ar Total: 1 COWS:  COWS Total Score: 1   Assessment- patient remains depressed and anxious, but affect seems improved . He is tolerating medications well, denies side effects. He is gaining insight regarding his hyperthyroidism and importance of ongoing treatment and medication compliance .  Anxiety is present but he seems more amenable to reassurance and suppport.  Treatment Plan Summary: Daily contact with patient to assess and evaluate symptoms and progress in treatment and Medication management  1. Continue crisis management and stabilization 2. ContinueRemeron to 30 mgs QHS  for depression and Insomnia, Remeron may also help anxiety. 3. Continue  Atenolol 25 mg daily and Tapazole 5 mg BID- to address hyperthyroidism .  4. Continue  Ensure Supplement to improve nutritional status . 5. Weigh patient in AM to document /monitor weight gain in the context of medication and diet supplementation. 6. Continue Trazodone 100 mgrs QHS PRN Insomnia .  Medical Decision Making:  Established Problem, Stable/Improving (1), Review of Psycho-Social Stressors (1), Review or order clinical lab tests (1), Review of Medication Regimen & Side Effects (2) and Review of New Medication or Change in Dosage (2)  DAVIS, LAURA, NP-C 06/26/2015, 3:49 PM  Agree with NP Progress Note as above   Neita Garnet, MD

## 2015-06-26 NOTE — [facility] Group Notes (Signed)
The focus of this group is to educate the patient on the purpose and policies of crisis stabilization and provide a format to answer questions about their admission.  The group details unit policies and expectations of patients while admitted.  Patient attended 0900 nurse education orientation group this morning.  Patient listened attentively, participated, appropriate affect.  Patient was alert, appropriate insight and engagement.  Today patient will work on 3 goals for discharge.

## 2015-06-26 NOTE — Tx Team (Signed)
Interdisciplinary Treatment Plan Update (Adult) Date: 06/26/2015   Time Reviewed: 9:30 AM  Progress in Treatment: Attending groups: Yes Participating in groups: Yes Taking medication as prescribed: Yes Tolerating medication: Yes Family/Significant other contact made: Yes, with sister Patient understands diagnosis: Yes Discussing patient identified problems/goals with staff: Yes Medical problems stabilized or resolved: Yes Denies suicidal/homicidal ideation: Yes Issues/concerns per patient self-inventory: Yes Other:  New problem(s) identified: N/A  Discharge Plan or Barriers:  7/8: Continuing to assess, patient new to milieu  7/12: Pt plans to stay at a homeless shelter temporarily until his monthly check is deposited. Pt will follow-up with [facility].  Reason for Continuation of Hospitalization:  Depression Anxiety Medication Stabilization   Comments: N/A  Estimated length of stay: 3-5 days  For review of initial/current patient goals, please see plan of care. Patient is a 54 year old male admitted for SI with recent overdose. Patient recently kicked out of apartment by roommate and has had multiple previous admissions. Patient will benefit from crisis stabilization, medication evaluation, group therapy, and psycho education in addition to case management for discharge planning. Patient and CSW reviewed pt's identified goals and treatment plan. Pt verbalized understanding and agreed to treatment plan.  Attendees:  Patient:    Family:    Physician: Dr. Jama Flavorsobos, MD  06/26/2015 8:50 AM  Nursing: Onnie BoerJennifer Clark, RN Case manager  06/26/2015 8:50 AM  Clinical Social Worker Lamar SprinklesLauren Carter, LCSWA, MSW 06/26/2015 8:50 AM  Other: Leisa LenzValerie Enoch, Vesta MixerMonarch Liasion 06/26/2015 8:50 AM  Clinical:  Quintella ReichertBeverly Knight, Liborio NixonPatrice White, Sunday SpillersKrista-RN 06/26/2015 8:50 AM  Other: , RN Charge Nurse 06/26/2015 8:50 AM  Other:         Scribe for Treatment Team:  Chad CordialLauren Carter,  Theresia MajorsLCSWA [phone]

## 2015-06-26 NOTE — [facility] Group Notes (Signed)
[facility] LCSW Group Therapy 06/26/2015 1:15 PM  Type of Therapy: Group Therapy- Feelings about Diagnosis  Pt did not attend, declined invitation.   Zahari Xiang Carter, LCSWA 06/26/2015 5:18 PM  

## 2015-06-26 NOTE — Progress Notes (Signed)
Recreation Therapy Notes  Animal-Assisted Activity (AAA) Program Checklist/Progress Notes Patient Eligibility Criteria Checklist & Daily Group note for Rec Tx Intervention  Date: 07.12.16 Time: 2:45 pm Location: [address] Dayroom   AAA/T Program Assumption of Risk Form signed by Patient/ or Parent Legal Guardian yes  Patient is free of allergies or sever asthma yes  Patient reports no fear of animals yes  Patient reports no history of cruelty to animalsyes  Patient understands his/her participation is voluntary yes  Patient washes hands before animal contact yes  Patient washes hands after animal contact yes  Behavioral Response: Engaged  Education: Hand Washing, Appropriate Animal Interaction   Education Outcome: Acknowledges understanding/In group clarification offered/Needs additional education.   Clinical Observations/Feedback:  Patient attended group.   Twinkle Sockwell, LRT/CTRS         Phillip Kemp A 06/26/2015 3:56 PM 

## 2015-06-26 NOTE — Progress Notes (Signed)
D:  Patient denied SI and HI, contracts for safety.  Denied A/V hallucinations.  Denied pain. A:  Medications administered per MD orders.  Emotional support and encouragement given patient. R:  Safety maintained with 15 minute checks.  

## 2015-06-27 MED ORDER — IBUPROFEN 600 MG PO TABS
600.0000 mg | ORAL_TABLET | Freq: Four times a day (QID) | ORAL | Status: DC | PRN
  Administered 2015-07-02 – 2015-07-03 (×2): 600 mg via ORAL
  Filled 2015-06-27 (×3): qty 1

## 2015-06-27 NOTE — [facility] Group Notes (Signed)
[facility] LCSW Group Therapy 06/27/2015 1:15 PM  Pt did not attend, declined invitation.   Chad CordialLauren Carter, LCSWA 06/27/2015 5:33 PM

## 2015-06-27 NOTE — Progress Notes (Signed)
Patient ID: Phillip Kemp, male   DOB: 11/30/1961, 54 y.o.   MRN: [id number] [facility] MD Progress Note  06/27/2015 12:48 PM Phillip Kemp  MRN:  [id number]   Subjective:   Patient remains depressed, but states he is feeling better overall- energy level is somewhat improved, appetite is starting to improve and states he has gained 5 lbs since his admission, and he is feeling less severely depressed . He also states he feels his neck mass ( thyroid) is " shrinking a little ".  Reports homelessness is major stressor, in addition to his medical condition. He states he is willing to go to a shelter " until I can get a place when my next check comes in". Ruminates about his prior roommate keeping some of his money and belongings, but denies any thoughts of HI or violence. States "when I leave I will ask police if they can escort me there so I can get some of my things out ".     Objective:  Patient case discussed with  Treatment team and patient seen. As noted, currently partially improved compared to admission- still depressed, however, and states he has still had some fleeting suicidal thoughts, but without any specific plan or intention. He is presenting with a somewhat fuller range of affect  Today.  He states, as above, that he is starting to feel better physically as well, and importantly that his appetite " is starting to come back".  Denies medication side effects at the present time. No disruptive behaviors on unit, going to groups. Labs reviewed -  Anemia improving,  Electrolytes  WNL.    Principal Problem: Major depression, recurrent, chronic Diagnosis:   Patient Active Problem List   Diagnosis Date Noted  . Major depression, recurrent, chronic [F33.9] 06/22/2015  . Depressed [F32.9]   . Alcohol abuse [F10.10] 03/20/2015  . Severe recurrent major depression without psychotic features [F33.2] 03/20/2015  . Suicidal ideations [R45.851] 03/20/2015  . Hyperthyroidism [E05.90] 11/25/2014   . Alcohol use disorder, severe, dependence [F10.20] 11/22/2014  . Thyroid disease [E07.9] 11/22/2014  . Graves' disease [E05.00] 11/22/2014  . Bipolar 1 disorder, depressed, severe [F31.4] 11/21/2014  . Aspiration pneumonia [J69.0] 07/15/2013  . Hypokalemia [E87.6] 07/15/2013  . Elevated LFTs [R79.89] 07/15/2013  . Microcytic anemia [D50.9] 07/15/2013  . Thrombocytopenia [D69.6] 07/15/2013  . 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 Adrenal nodule, left [E27.9] 07/15/2013  . Chest pain, midsternal [R07.2] 09/24/2012  . Depression [F32.9] 09/11/2012  . Papules [L98.9] 09/10/2012  . Suicidal ideation [R45.851] 09/09/2012  . Chronic blood loss anemia [D50.0] 09/09/2012  . Acute alcoholic pancreatitis [M09.4] 09/08/2012  . Rectal bleeding [K62.5] 09/08/2012  . Cocaine abuse [F14.10] 09/08/2012  . Anemia [D64.9] 09/08/2012  . Left adrenal mass [E27.9] 09/08/2012  . Pseudogout of knee [M11.869] 08/27/2011  . Substance abuse [F19.10] 08/27/2011  . Chronic pain [G89.29] 08/27/2011   Total Time spent with patient: 20 minutes  Past Medical History:  Past Medical History  Diagnosis Date  . Arthritis   . Substance abuse     Cocaine, marijuana, and alcohol  . Suicidal ideation 09/09/2012  . Chronic blood loss anemia 09/09/2012  . Rectal bleeding 09/08/2012  . Acute alcoholic pancreatitis 06/22/6282  . Depression   . Hyperthyroidism     Past Surgical History  Procedure Laterality Date  . Orthopedic surgery      Left foot surgery  . Left foot surgery    . Left cataract extraction     Family History:  Family History  Problem Relation Age of Onset  . Heart disease Mother   . Cancer Father     prostate   Social History:  History  Alcohol Use  . 28.8 oz/week  . 48 Cans of beer per week     History  Drug Use  . Yes  . Special: Marijuana, Cocaine    History   Social History  . Marital Status: Single    Spouse Name: N/A  . Number of Children: 2  . Years of Education: 12    Occupational History  . Teaching laboratory technician.    Social History Main Topics  . Smoking status: Former Smoker -- 0.25 packs/day for 10 years    Types: Cigarettes  . Smokeless tobacco: Former Systems developer    Quit date: 09/16/2012  . Alcohol Use: 28.8 oz/week    48 Cans of beer per week  . Drug Use: Yes    Special: Marijuana, Cocaine  . Sexual Activity: No   Other Topics Concern  . None   Social History Narrative   Widowed.  Wife died of leukemia.  2 children.  Lives in the [place].   Additional History:    Sleep:  Fair , but improving  Appetite:  Good  Assessment:   Musculoskeletal: Strength & Muscle Tone: within normal limits Gait & Station: normal Patient leans: N/A  Psychiatric Specialty Exam: Physical Exam  Constitutional: He is oriented to person, place, and time.  Neck: Normal range of motion.  Respiratory: Effort normal and breath sounds normal.  Musculoskeletal: Normal range of motion.  Neurological: He is alert and oriented to person, place, and time.    Review of Systems  Constitutional: Positive for weight loss.  Psychiatric/Behavioral: Positive for depression and suicidal ideas. Negative for hallucinations, memory loss and substance abuse. The patient is nervous/anxious and has insomnia.    Constitutional: Positive for weight loss and malaise/fatigue.  HENT:   Mass on anterior aspect of neck- likely thyroid enlargement  Eyes: Negative.  Respiratory: Negative.   Shortness of breath upon exertion  Cardiovascular: Negative.  Gastrointestinal: Positive for nausea and blood in stool.   No rectal bleeding or melenas at this time  Genitourinary: Negative.  Musculoskeletal: Negative.  Skin: Negative.  Neurological: Positive for headaches. Negative for seizures.  Endo/Heme/Allergies: Negative.  Psychiatric/Behavioral: Positive for depression, suicidal ideas and substance abuse.   ( sober x 6 months )  all other systems negative     Blood pressure 120/60, pulse 96, temperature 98.7 F (37.1 C), temperature source Oral, resp. rate 15, height 6' 2" (1.88 m), weight 112 lb (50.803 kg).Body mass index is 14.37 kg/(m^2).  General Appearance: grooming is improving.  Eye Contact::  Good  Speech:  Clear and Coherent and Normal Rate  Volume:  Normal  Mood:  Still depressed, but improving gradually  Affect:    Less constricted range of affect   Thought Process:  Denies hallucinations, delusions, and paranoia  Orientation:  Full (Time, Place, and Person)  Thought Content:  Rumination about physical health - no hallucinations,no delusions   Suicidal Thoughts:  Denies at this time  Homicidal Thoughts:  Denies at this time  Memory:  Immediate;   Good Recent;   Good Remote;   Good  Judgement:  Other:  improved   Insight:  Fair and improved  Psychomotor Activity:   Improving, less isolative   Concentration:  Fair  Recall:  Good  Fund of Knowledge:Good  Language: Good  Akathisia:  Negative  Handed:  Right  AIMS (if indicated):     Assets:  Communication Skills Desire for Improvement  ADL's:  Intact  Cognition: WNL  Sleep:  Number of Hours: 5     Current Medications: Current Facility-Administered Medications  Medication Dose Route Frequency Provider Last Rate Last Dose  . acetaminophen (TYLENOL) tablet 650 mg  650 mg Oral Q6H PRN Harriet Butte, NP   650 mg at 06/26/15 1711  . alum & mag hydroxide-simeth (MAALOX/MYLANTA) 200-200-20 MG/5ML suspension 30 mL  30 mL Oral Q4H PRN Harriet Butte, NP      . atenolol (TENORMIN) tablet 25 mg  25 mg Oral Daily Harriet Butte, NP   25 mg at 06/27/15 0816  . feeding supplement (ENSURE ENLIVE) (ENSURE ENLIVE) liquid 237 mL  237 mL Oral TID BM Laverle Hobby, PA-C   237 mL at 06/27/15 0817  . hydrOXYzine (ATARAX/VISTARIL) tablet 25 mg  25 mg Oral Q6H PRN Harriet Butte, NP   25 mg at 06/26/15 2054  . magnesium hydroxide (MILK OF MAGNESIA) suspension 30 mL  30 mL Oral Daily  PRN Harriet Butte, NP      . methimazole (TAPAZOLE) tablet 5 mg  5 mg Oral BID Harriet Butte, NP   5 mg at 06/27/15 0816  . mirtazapine (REMERON) tablet 30 mg  30 mg Oral QHS Jenne Campus, MD   30 mg at 06/26/15 2216  . multivitamin with minerals tablet 1 tablet  1 tablet Oral Daily Jenne Campus, MD   1 tablet at 06/27/15 0816  . traZODone (DESYREL) tablet 100 mg  100 mg Oral QHS PRN Niel Hummer, NP   100 mg at 06/25/15 2236    Lab Results:  Results for orders placed or performed during the hospital encounter of 06/22/15 (from the past 48 hour(s))  CBC with Differential/Platelet     Status: Abnormal   Collection Time: 06/26/15  6:24 AM  Result Value Ref Range   WBC 11.0 (H) 4.0 - 10.5 K/uL   RBC 3.45 (L) 4.22 - 5.81 MIL/uL   Hemoglobin 9.1 (L) 13.0 - 17.0 g/dL   HCT 28.5 (L) 39.0 - 52.0 %   MCV 82.6 78.0 - 100.0 fL   MCH 26.4 26.0 - 34.0 pg   MCHC 31.9 30.0 - 36.0 g/dL   RDW 16.5 (H) 11.5 - 15.5 %   Platelets 144 (L) 150 - 400 K/uL   Neutrophils Relative % 66 43 - 77 %   Neutro Abs 7.4 1.7 - 7.7 K/uL   Lymphocytes Relative 21 12 - 46 %   Lymphs Abs 2.3 0.7 - 4.0 K/uL   Monocytes Relative 10 3 - 12 %   Monocytes Absolute 1.0 0.1 - 1.0 K/uL   Eosinophils Relative 3 0 - 5 %   Eosinophils Absolute 0.3 0.0 - 0.7 K/uL   Basophils Relative 0 0 - 1 %   Basophils Absolute 0.0 0.0 - 0.1 K/uL    Comment: Performed at [facility] metabolic panel     Status: Abnormal   Collection Time: 06/26/15  6:24 AM  Result Value Ref Range   Sodium 137 135 - 145 mmol/L   Potassium 4.9 3.5 - 5.1 mmol/L   Chloride 105 101 - 111 mmol/L   CO2 24 22 - 32 mmol/L   Glucose, Bld 95 65 - 99 mg/dL   BUN 20 6 - 20 mg/dL   Creatinine, Ser 0.50 (L) 0.61 - 1.24 mg/dL  Calcium 9.1 8.9 - 10.3 mg/dL   GFR calc non Af Amer >60 >60 mL/min   GFR calc Af Amer >60 >60 mL/min    Comment: (NOTE) The eGFR has been calculated using the CKD EPI equation. This calculation has not  been validated in all clinical situations. eGFR's persistently <60 mL/min signify possible Chronic Kidney Disease.    Anion gap 8 5 - 15    Comment: Performed at [facility]    Physical Findings: AIMS: Facial and Oral Movements Muscles of Facial Expression: None, normal Lips and Perioral Area: None, normal Jaw: None, normal Tongue: None, normal,Extremity Movements Upper (arms, wrists, hands, fingers): None, normal Lower (legs, knees, ankles, toes): None, normal, Trunk Movements Neck, shoulders, hips: None, normal, Overall Severity Severity of abnormal movements (highest score from questions above): None, normal Incapacitation due to abnormal movements: None, normal Patient's awareness of abnormal movements (rate only patient's report): No Awareness, Dental Status Current problems with teeth and/or dentures?: Yes Does patient usually wear dentures?: No  CIWA:  CIWA-Ar Total: 1 COWS:  COWS Total Score: 1   Assessment-  Patient is gradually improving insofar as mood and affect, but remains depressed. Not suicidal or psychotic. Improvement parallels gradual medical improvement on Tapazole- states size of thyroid mass seems to be shrinking, appetite increased, weight starting to increase, less weakness . Anxiety still present but also gradually improving. Homelessness a stressor, but optimistic he will be able to get a place for himself after his next check comes in . At this time  Remains somewhat ruminative about recent eviction by roommate and having some belongings still there, but denies any thoughts of violence of homicidal ideations.   Treatment Plan Summary: Daily contact with patient to assess and evaluate symptoms and progress in treatment and Medication management  1. Continue crisis management and stabilization 2. Continue Remeron 30 mgs QHS  for depression and Insomnia, Remeron may also help anxiety. 3. Continue  Atenolol 25 mg daily and Tapazole 5 mg  BID- to address hyperthyroidism .  4. Continue  Ensure Supplement to improve nutritional status . 5. We have reviewed importance of following up with PCP/Endocrinologist soon after discharge and not stopping Tapazole /Atenolol after discharge unless directed by a physician.  6. Continue Trazodone  100 mgrs QHS PRN Insomnia .  Medical Decision Making:  Established Problem, Stable/Improving (1), Review of Psycho-Social Stressors (1), Review or order clinical lab tests (1), Review of Medication Regimen & Side Effects (2) and Review of New Medication or Change in Dosage (2)  Neita Garnet, MD  06/27/2015, 12:48 PM

## 2015-06-27 NOTE — Progress Notes (Signed)
[facility] Group Notes:  (Nursing/MHT/Case Management/Adjunct)  Date:  06/27/2015  Time:  10:38 PM  Type of Therapy:  Psychoeducational Skills  Participation Level:  Active  Participation Quality:  Appropriate  Affect:  Appropriate  Cognitive:  Appropriate  Insight:  Good  Engagement in Group:  Engaged  Modes of Intervention:  Discussion  Summary of Progress/Problems: Tonight in wrap up group Phillip Kemp said that his day was a 10 because he was informed by his SW that when he was d/c he would have somewhere to go and that he wasn't going back on the streets, this reassured him.  Phillip Kemp 06/27/2015, 10:38 PM

## 2015-06-27 NOTE — Progress Notes (Signed)
D:  Per pt self inventory pt reports sleeping poor, appetite fair, energy level normal, ability to pay attention good, rates depression at a 9 out of 10, hopelessness at a 9 out of 10, anxiety at a 9 out of 10, anxious/depressed affect, denies SI/HI/AVH, goal today: "Find a main doctor and talk to a Child psychotherapistsocial worker"      A:  Emotional support provided, Encouraged pt to continue with treatment plan and attend all group activities, q15 min checks maintained for safety.  R:  Pt is receptive but tends to isolate and sleep a lot in room, needs encouragement, pleasant and cooperative with staff and other patients on the unit.

## 2015-06-27 NOTE — [facility] Group Notes (Signed)
[facility]  06/27/2015 8:45 AM  Participation Quality: Alert, Appropriate and Oriented  Mood/Affect: Flat  Depression Rating: 8-9  Anxiety Rating: 8-9  Thoughts of Suicide: Pt denies SI/HI  Will you contract for safety? Yes  Current AVH: Pt denies  Plan for Discharge/Comments: Pt attended discharge planning group and actively participated in group. CSW discussed suicide prevention education with the group and encouraged them to discuss discharge planning and any relevant barriers. Pt reports that his depression and anxiety is directly related to his situation of homelessness. Pt reports feeling sleepy.  Transportation Means: Pt reports access to transportation  Supports: No supports mentioned at this time  Chad CordialLauren Carter, LCSWA 06/27/2015 10:54 AM

## 2015-06-27 NOTE — Progress Notes (Signed)
Recreation Therapy Notes  Date: 07.13.16 Time: 9:30 am Location: [address] Dayroom  Group Topic: Stress Management  Goal Area(s) Addresses:  Patient will verbalize importance of using healthy stress management.  Patient will identify positive emotions associated with healthy stress management.   Intervention: Stress Management  Activity :  Guided Imagery.  LRT introduced and explained the stress management technique of guided imagery.  LRT used a script to deliver the technique.  Patients were asked to follow the scripts read a loud by the LRT to participate in the stress management technique.  Education:  Stress Management, Discharge Planning.   Education Outcome: Acknowledges edcuation/In group clarification offered/Needs additional education  Clinical Observations/Feedback: Patient did not attend group.   Caroll RancherMarjette Shermar Friedland, LRT/CTRS         Caroll RancherLindsay, Autrey Human A 06/27/2015 1:43 PM

## 2015-06-28 NOTE — Plan of Care (Signed)
Problem: Ineffective individual coping Goal: STG: Patient will remain free from self harm Outcome: Progressing Pt remains free from self harm today     

## 2015-06-28 NOTE — Progress Notes (Signed)
D: Pt at this time is alert and oriented x 4. Pt continues to endorse severe anxiety with depression. Pt complained of generalized pain of 9 on a 0-10 scale. Pt at this time is worried about homelessness; he states, "I don't know where I would be going when I leave here". Pt denies SI/HI/AVH. A: Medications administered as prescribed.  Support, encouragement, and safe environment provided.  15-minute safety checks continue. R: Pt was med compliant.  Safety checks continue.

## 2015-06-28 NOTE — Progress Notes (Signed)
Pt attended Karaoke group tonight. 

## 2015-06-28 NOTE — [facility] Group Notes (Signed)
[facility] 06/28/2015 1:15pm  Type of Therapy: Mental Health Association Presentation  Participation Level: Active  Participation Quality: Attentive  Affect: Appropriate  Cognitive: Oriented  Insight: Developing/Improving  Engagement in Therapy: Engaged  Modes of Intervention: Discussion, Education and Socialization  Summary of Progress/Problems: Mental Health Association (MHA) Speaker came to talk about his personal journey with substance abuse and addiction. The pt processed ways by which to relate to the speaker. MHA speaker provided handouts and educational information pertaining to groups and services offered by [facility]. Pt was engaged in speaker's presentation and was receptive to resources provided.    Chad CordialLauren Carter, LCSWA 06/28/2015 1:51 PM

## 2015-06-28 NOTE — Progress Notes (Signed)
Patient ID: Phillip Kemp, male   DOB: [birth year], 54 y.o.   MRN: [id number] [facility] MD Progress Note  06/28/2015 6:09 PM BERKELEY VANAKEN  MRN:  [id number]   Subjective:    Patient reports he is starting to feel " better". He reports size of thyroid mass is shrinking, and as noted, he feels healthier in general, and has been eating better,and gaining some weight. He is still anxious, apprehensive, mostly related to his medical condition, follow ups, and about his homelessness stressor. He denies medication side effects. .     Objective:  Patient case discussed with  Treatment team and patient seen. Mood and affect partially improved - still depressed, anxious, but clearly improved compared to his admission status. Neck mass, enlarged thyroid  has been shrinking, and improvement is plainly visible today.  No disruptive behaviors on unit, some group participation. He is denying medication side effects . Denies any alcohol cravings . Remains ruminative and anxious about homelessness.  Blood pressure has been trending low at times, particularly in AM. He is on Atenolol for hyperthyroidism. At this time denies dizziness, presyncopal symptoms.    Principal Problem: Major depression, recurrent, chronic Diagnosis:   Patient Active Problem List   Diagnosis Date Noted  . Major depression, recurrent, chronic [F33.9] 06/22/2015  . Depressed [F32.9]   . Alcohol abuse [F10.10] 03/20/2015  . Severe recurrent major depression without psychotic features [F33.2] 03/20/2015  . Suicidal ideations [R45.851] 03/20/2015  . Hyperthyroidism [E05.90] 11/25/2014  . Alcohol use disorder, severe, dependence [F10.20] 11/22/2014  . Thyroid disease [E07.9] 11/22/2014  . Graves' disease [E05.00] 11/22/2014  . Bipolar 1 disorder, depressed, severe [F31.4] 11/21/2014  . Aspiration pneumonia [J69.0] 07/15/2013  . Hypokalemia [E87.6] 07/15/2013  . Elevated LFTs [R79.89] 07/15/2013  . Microcytic anemia [D50.9]  07/15/2013  . Thrombocytopenia [D69.6] 07/15/2013  . 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 Adrenal nodule, left [E27.9] 07/15/2013  . Chest pain, midsternal [R07.2] 09/24/2012  . Depression [F32.9] 09/11/2012  . Papules [L98.9] 09/10/2012  . Suicidal ideation [R45.851] 09/09/2012  . Chronic blood loss anemia [D50.0] 09/09/2012  . Acute alcoholic pancreatitis [K85.2] 78/29/5621  . Rectal bleeding [K62.5] 09/08/2012  . Cocaine abuse [F14.10] 09/08/2012  . Anemia [D64.9] 09/08/2012  . Left adrenal mass [E27.9] 09/08/2012  . Pseudogout of knee [M11.869] 08/27/2011  . Substance abuse [F19.10] 08/27/2011  . Chronic pain [G89.29] 08/27/2011   Total Time spent with patient: 20 minutes  Past Medical History:  Past Medical History  Diagnosis Date  . Arthritis   . Substance abuse     Cocaine, marijuana, and alcohol  . Suicidal ideation 09/09/2012  . Chronic blood loss anemia 09/09/2012  . Rectal bleeding 09/08/2012  . Acute alcoholic pancreatitis 09/08/2012  . Depression   . Hyperthyroidism     Past Surgical History  Procedure Laterality Date  . Orthopedic surgery      Left foot surgery  . Left foot surgery    . Left cataract extraction     Family History:  Family History  Problem Relation Age of Onset  . Heart disease Mother   . Cancer Father     prostate   Social History:  History  Alcohol Use  . 28.8 oz/week  . 48 Cans of beer per week     History  Drug Use  . Yes  . Special: Marijuana, Cocaine    History   Social History  . Marital Status: Single    Spouse Name: N/A  . Number of Children: 2  .  Years of Education: 12   Occupational History  . Quarry manager.    Social History Main Topics  . Smoking status: Former Smoker -- 0.25 packs/day for 10 years    Types: Cigarettes  . Smokeless tobacco: Former Neurosurgeon    Quit date: 09/16/2012  . Alcohol Use: 28.8 oz/week    48 Cans of beer per week  . Drug Use: Yes    Special: Marijuana, Cocaine  . Sexual Activity: No    Other Topics Concern  . None   Social History Narrative   Widowed.  Wife died of leukemia.  2 children.  [place] [place]   Additional History:    Sleep:  Fair , but improving  Appetite:  Good  Assessment:   Musculoskeletal: Strength & Muscle Tone: within normal limits Gait & Station: normal Patient leans: N/A  Psychiatric Specialty Exam: Physical Exam  Constitutional: He is oriented to person, place, and time.  Neck: Normal range of motion.  Respiratory: Effort normal and breath sounds normal.  Musculoskeletal: Normal range of motion.  Neurological: He is alert and oriented to person, place, and time.    Review of Systems  Constitutional: Positive for weight loss.  Psychiatric/Behavioral: Positive for depression and suicidal ideas. Negative for hallucinations, memory loss and substance abuse. The patient is nervous/anxious and has insomnia.    Constitutional: Positive for weight loss and malaise/fatigue.  HENT:   Mass on anterior aspect of neck- likely thyroid enlargement  Eyes: Negative.  Respiratory: Negative.   Shortness of breath upon exertion  Cardiovascular: Negative.  Gastrointestinal: Positive for nausea and blood in stool.   No rectal bleeding or melenas at this time  Genitourinary: Negative.  Musculoskeletal: Negative.  Skin: Negative.  Neurological: Positive for headaches. Negative for seizures.  Endo/Heme/Allergies: Negative.  Psychiatric/Behavioral: Positive for depression, suicidal ideas and substance abuse.   ( sober x 6 months )  all other systems negative    Blood pressure 110/60, pulse 82, temperature 98 F (36.7 C), temperature source Oral, resp. rate 14, height  (1.88 m), weight 112 lb (50.803 kg), SpO2 100 %.Body mass index is 14.37 kg/(m^2).  General Appearance: grooming is improving.  Eye Contact::  Good  Speech:  Clear and Coherent and Normal Rate  Volume:  Normal  Mood:   Gradually  improving   Affect:    Less constricted, does remain anxious  Thought Process:  Denies hallucinations, delusions, and paranoia  Orientation:  Full (Time, Place, and Person)  Thought Content:  Rumination about physical health - no hallucinations,no delusions   Suicidal Thoughts:  Denies at this time  Homicidal Thoughts:  Denies at this time  Memory:  Immediate;   Good Recent;   Good Remote;   Good  Judgement:  Other:  improved   Insight:  Fair and improved  Psychomotor Activity:   Improving, less isolative   Concentration:  Fair  Recall:  Good  Fund of Knowledge:Good  Language: Good  Akathisia:  Negative  Handed:  Right  AIMS (if indicated):     Assets:  Communication Skills Desire for Improvement  ADL's:  Intact  Cognition: WNL  Sleep:  Number of Hours: 5     Current Medications: Current Facility-Administered Medications  Medication Dose Route Frequency Provider Last Rate Last Dose  . acetaminophen (TYLENOL) tablet 650 mg  650 mg Oral Q6H PRN Worthy Flank, NP   650 mg at 06/28/15 0935  . alum & mag hydroxide-simeth (MAALOX/MYLANTA) 200-200-20 MG/5ML suspension 30 mL  30 mL Oral Q4H PRN Worthy Flank, NP      . atenolol (TENORMIN) tablet 25 mg  25 mg Oral Daily Worthy Flank, NP   25 mg at 06/28/15 0818  . feeding supplement (ENSURE ENLIVE) (ENSURE ENLIVE) liquid 237 mL  237 mL Oral TID BM Spencer E Simon, PA-C   237 mL at 06/28/15 1400  . hydrOXYzine (ATARAX/VISTARIL) tablet 25 mg  25 mg Oral Q6H PRN Worthy Flank, NP   25 mg at 06/26/15 2054  . ibuprofen (ADVIL,MOTRIN) tablet 600 mg  600 mg Oral Q6H PRN Kerry Hough, PA-C      . magnesium hydroxide (MILK OF MAGNESIA) suspension 30 mL  30 mL Oral Daily PRN Worthy Flank, NP      . methimazole (TAPAZOLE) tablet 5 mg  5 mg Oral BID Worthy Flank, NP   5 mg at 06/28/15 0818  . mirtazapine (REMERON) tablet 30 mg  30 mg Oral QHS Craige Cotta, MD   30 mg at 06/26/15 2216  . multivitamin with minerals tablet 1  tablet  1 tablet Oral Daily Craige Cotta, MD   1 tablet at 06/28/15 0818  . traZODone (DESYREL) tablet 100 mg  100 mg Oral QHS PRN Thermon Leyland, NP   100 mg at 06/27/15 2209    Lab Results:  No results found for this or any previous visit (from the past 48 hour(s)).  Physical Findings: AIMS: Facial and Oral Movements Muscles of Facial Expression: None, normal Lips and Perioral Area: None, normal Jaw: None, normal Tongue: None, normal,Extremity Movements Upper (arms, wrists, hands, fingers): None, normal Lower (legs, knees, ankles, toes): None, normal, Trunk Movements Neck, shoulders, hips: None, normal, Overall Severity Severity of abnormal movements (highest score from questions above): None, normal Incapacitation due to abnormal movements: None, normal Patient's awareness of abnormal movements (rate only patient's report): No Awareness, Dental Status Current problems with teeth and/or dentures?: Yes Does patient usually wear dentures?: No  CIWA:  CIWA-Ar Total: 1 COWS:  COWS Total Score: 1   Assessment- Mood and affect improving and patient is less depressed . He is not suicidal . As depression subsides and he starts to focus more on discharge, his anxiety has increased somewhat, and today he has anxious ruminations about where he will be going after discharge, how he is going to keep PCP appointments, whether he might need surgery for thyroidectomy or not.  Responds partially to support and empathy. Thyroid enlargement improving , appetite is improved, weakness is subsiding. Tolerating Tapazole well. Thus far tolerating Remeron well- denies side effects .  Sleep is fair .   Treatment Plan Summary: Daily contact with patient to assess and evaluate symptoms and progress in treatment and Medication management  1. Continue crisis management and stabilization 2. Continue Remeron 30 mgs QHS  for depression and Insomnia, Remeron may also help anxiety. 3. Continue  Atenolol 25 mg  daily and Tapazole 5 mg BID- to address hyperthyroidism .  4. Continue  Ensure Supplement to improve nutritional status . 5. Patient encouraged to increase fluid consumption to minimize risk of orthostasis- he states " I feel I have not been drinking enough water ".  6. Continue Trazodone  100 mgrs QHS PRN Insomnia .  Medical Decision Making:  Established Problem, Stable/Improving (1), Review of Psycho-Social Stressors (1), Review or order clinical lab tests (1), Review of Medication Regimen & Side Effects (2) and Review of New Medication or Change in Dosage (2)  Nehemiah MassedOBOS, Aylissa Heinemann, MD  06/28/2015, 6:09 PM

## 2015-06-28 NOTE — Progress Notes (Addendum)
Patient ID: Phillip Kemp, male   DOB: 11/18/1961, 54 y.o.   MRN: [id number]   Pt currently presents with a flat affect and anxious behavior. Per self inventory, pt rates depression, hopelessness and anxiety at a 0. Pt daily goal is "finding me a place to go" and they intend to do so by "talk to my case worker." Pt reports good sleep, good concentration, low energy and a fair appetite.   Pt provided with medications per providers orders. Pt's labs and vitals were monitored throughout the day. Pt supported emotionally and encouraged to express concerns and questions. Pt educated on medications and hydration.   Pt's safety ensured with 15 minute and environmental checks. Pt currently denies SI/HI and A/V hallucinations. Pt verbally agrees to seek staff if SI/HI or A/VH occurs and to consult with staff before acting on these thoughts. Pt c/o dizziness and headache today. Pt had period of hypotension this morning. BP monitored and maintained now at his baseline. Will continue POC and to monitor.

## 2015-06-28 NOTE — Progress Notes (Signed)
Patient ID: Gwendalyn EgeRaymond M Nawabi, male   DOB: Jan 02, 1961, 54 y.o.   MRN: [id number]  Adult Psychoeducational Group Note  Date:  06/28/2015 Time: 09:30am  Group Topic/Focus:  Recovery Goals:   The focus of this group is to identify appropriate goals for recovery and establish a plan to achieve them.  Participation Level:  Did Not Attend  Participation Quality:  n/a  Affect:  n/a  Cognitive:  n/a  Insight: n/a  Engagement in Group:  n/a  Modes of Intervention:  Discussion, Education, Exploration and Support  Additional Comments:  Pt did not attend. Pt in bed resting per nursing care instruction.   Aurora Maskwyman, Yarixa Lightcap E 06/28/2015, 10:39 AM

## 2015-06-29 NOTE — Progress Notes (Signed)
[facility] Group Notes:  (Nursing/MHT/Case Management/Adjunct)  Date:  06/29/2015  Time:  9:26 PM  Type of Therapy:  Psychoeducational Skills  Participation Level:  Active  Participation Quality:  Appropriate  Affect:  Appropriate  Cognitive:  Appropriate  Insight:  Appropriate  Engagement in Group:  Engaged and Improving  Modes of Intervention:  Discussion  Summary of Progress/Problems:Phillip Kemp rated his day at a 10.  One positive thing that happened for him today was being informed of a possible discharge tomorrow.  Phillip Kemp 06/29/2015, 9:26 PM

## 2015-06-29 NOTE — Plan of Care (Signed)
Problem: Diagnosis: Increased Risk For Suicide Attempt Goal: LTG-Patient Will Report Improved Mood and Deny Suicidal LTG (by discharge) Patient will report improved mood and deny suicidal ideation.  Outcome: Progressing Patient currently denies suicidal ideation.

## 2015-06-29 NOTE — Progress Notes (Signed)
D:  Per pt self inventory pt reports sleeping poor, states that the medication is not helping, appetite fair, energy level low, ability to pay attention good, rates depression at a 9 out of 10, hopelessness at a 9 out of 10, anxiety at a 9 out of 10, denies SI/HI/AVH, states that his mood has improved some from yesterday, feeling somewhat better, goal today:  "getting my discharge plan together, talk to my doctor and social worker today"     A:  Emotional support provided, Encouraged pt to continue with treatment plan and attend all group activities, q15 min checks maintained for safety.  R:  Pt is receptive, going to some groups, needs some encouragement, pleasant and cooperative with staff and other patients on the unit, med compliant.

## 2015-06-29 NOTE — Progress Notes (Signed)
Writer spoke with patient 1:1 and he reports that his day has been good. He reports that he still has to have a place to live once discharged and is hopeful that something will work out. He was observed up in the dayroom watching tv with minimal to no interaction with peers. Writer in formed him of scheduled medication and he reports that his remeron did not help him to rest last night and requested that he take the trazadone first and if he does not fall asleep he will request the remeron. He currently denies si/hi/a/v hallucinations. Support and encouragement given, safety maintained on unit with 15 min checks.

## 2015-06-29 NOTE — Tx Team (Signed)
Interdisciplinary Treatment Plan Update (Adult) Date: 06/29/2015   Time Reviewed: 9:30 AM  Progress in Treatment: Attending groups: Yes Participating in groups: Yes Taking medication as prescribed: Yes Tolerating medication: Yes Family/Significant other contact made: Yes, with sister Patient understands diagnosis: Yes Discussing patient identified problems/goals with staff: Yes Medical problems stabilized or resolved: Yes Denies suicidal/homicidal ideation: Yes Issues/concerns per patient self-inventory: Yes Other:  New problem(s) identified: N/A  Discharge Plan or Barriers:  7/8: Continuing to assess, patient new to milieu  7/12: Pt plans to stay at a homeless shelter temporarily until his monthly check is deposited. Pt will follow-up with [facility].   Reason for Continuation of Hospitalization:  Depression Anxiety Medication Stabilization   Comments: N/A  Estimated length of stay: 1-2 days  For review of initial/current patient goals, please see plan of care. Patient is a 54 year old male admitted for SI with recent overdose. Patient recently kicked out of apartment by roommate and has had multiple previous admissions. Patient will benefit from crisis stabilization, medication evaluation, group therapy, and psycho education in addition to case management for discharge planning. Patient and CSW reviewed pt's identified goals and treatment plan. Pt verbalized understanding and agreed to treatment plan.  Attendees:  Patient:    Family:    Physician: Dr. Jama Flavorsobos, MD  06/29/2015 8:28 AM  Nursing: Onnie BoerJennifer Clark, RN Case manager  06/29/2015 8:28 AM  Clinical Social Worker Lamar SprinklesLauren Carter, LCSWA, MSW 06/29/2015 8:28 AM  Other: Leisa LenzValerie Enoch, Vesta MixerMonarch Liasion 06/29/2015 8:28 AM  Clinical: Keitha ButteAndrea Thorn, RN; Fenton MallingMary Trainer, RN  06/29/2015 8:28 AM  Other: , RN Charge Nurse 06/29/2015 8:28 AM  Other:       Scribe for Treatment Team:  Chad CordialLauren Carter, Theresia MajorsLCSWA [phone]

## 2015-06-29 NOTE — [facility] Group Notes (Signed)
[facility] LCSW Group Therapy 06/29/2015 1:15pm  Type of Therapy: Group Therapy- Feelings Around Relapse and Recovery  Pt did not attend, declined invitation.   Chad CordialLauren Carter, Theresia MajorsLCSWA [phone] 06/29/2015 4:30 PM

## 2015-06-29 NOTE — [facility] Group Notes (Signed)
[facility] LCSW Aftercare Discharge Planning Group Note  06/29/2015 8:45 AM  Participation Quality: Alert, Appropriate and Oriented  Mood/Affect: Appropriate  Depression Rating: 0  Anxiety Rating: 0  Thoughts of Suicide: Pt denies SI/HI  Will you contract for safety? Yes  Current AVH: Pt denies  Plan for Discharge/Comments: Pt attended discharge planning group and actively participated in group. CSW discussed suicide prevention education with the group and encouraged them to discuss discharge planning and any relevant barriers. Pt reports feeling better mentally and physically. Pt expressed that he is glad to see progress in his mental and physical state.  Transportation Means: Pt reports access to transportation  Supports: No supports mentioned at this time  Chad CordialLauren Carter, LCSWA 06/29/2015 10:15 AM

## 2015-06-29 NOTE — Progress Notes (Signed)
Recreation Therapy Notes  Date: 07.15.16 Time: 9:30 am Location: [address] Dayroom  Group Topic: Stress Management  Goal Area(s) Addresses:  Patient will verbalize importance of using healthy stress management.  Patient will identify positive emotions associated with healthy stress management.   Behavioral Outcome: Engaged  Intervention: Stress Management  Activity : Progressive Muscle Relaxation. LRT introduced and explained the stress management technique of progressive muscle relaxation. LRT used a script to deliver the technique. Patients were asked to follow the script read a loud by the LRT to participate in the stress management technique.  Education: Stress Management, Discharge Planning.   Education Outcome: Acknowledges edcuation/In group clarification offered  Clinical Observations/Feedback: Patient attended group.   [name], LRT/CTRS  [name] A 06/29/2015 11:47 AM 

## 2015-06-29 NOTE — Progress Notes (Signed)
Patient ID: Phillip Kemp, male   DOB: [birth year], 54 y.o.   MRN: [id number] [facility] MD Progress Note  06/29/2015 3:29 PM Phillip Kemp  MRN:  [id number]   Subjective:   Patient reports gradual improvement. Tapazole felt to be working, with shrinking thyroid size, improved appetite and energy level.  Objective:  Patient case discussed with  Treatment team and patient seen. Patient continues to report significant depression and anxiety, but he presents with much improved range of affect and improved mood. He remains anxious, in particular regarding his medical illness ( Hyperthyroidism, Grave's  ) and how he will be able to get ongoing treatment after discharge , due to challenges such as homelessness, limited finances . As discussed with staff, at this time team is working on getting him appointment with Dr. Sharl Ma, Endocrinologist at [facility], for next week if possible, and in trying to arrange for patient to go to [facility] . Patient  Is gradually regaining weight, and is eating better, but remains under-nourished.  He is denying any suicidal ideations- mood better, but still struggling with anxiety and feeling overwhelmed at times . No disruptive behaviors on unit . Going to groups, less isolated . Thus far tolerating Remeron well, but states he does not sleep well unless he takes Trazodone along with Remeron.   Principal Problem: Major depression, recurrent, chronic Diagnosis:   Patient Active Problem List   Diagnosis Date Noted  . Major depression, recurrent, chronic [F33.9] 06/22/2015  . Depressed [F32.9]   . Alcohol abuse [F10.10] 03/20/2015  . Severe recurrent major depression without psychotic features [F33.2] 03/20/2015  . Suicidal ideations [R45.851] 03/20/2015  . Hyperthyroidism [E05.90] 11/25/2014  . Alcohol use disorder, severe, dependence [F10.20] 11/22/2014  . Thyroid disease [E07.9] 11/22/2014  . Graves' disease [E05.00] 11/22/2014  . Bipolar 1 disorder,  depressed, severe [F31.4] 11/21/2014  . Aspiration pneumonia [J69.0] 07/15/2013  . Hypokalemia [E87.6] 07/15/2013  . Elevated LFTs [R79.89] 07/15/2013  . Microcytic anemia [D50.9] 07/15/2013  . Thrombocytopenia [D69.6] 07/15/2013  . 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 Adrenal nodule, left [E27.9] 07/15/2013  . Chest pain, midsternal [R07.2] 09/24/2012  . Depression [F32.9] 09/11/2012  . Papules [L98.9] 09/10/2012  . Suicidal ideation [R45.851] 09/09/2012  . Chronic blood loss anemia [D50.0] 09/09/2012  . Acute alcoholic pancreatitis [K85.2] 78/29/5621  . Rectal bleeding [K62.5] 09/08/2012  . Cocaine abuse [F14.10] 09/08/2012  . Anemia [D64.9] 09/08/2012  . Left adrenal mass [E27.9] 09/08/2012  . Pseudogout of knee [M11.869] 08/27/2011  . Substance abuse [F19.10] 08/27/2011  . Chronic pain [G89.29] 08/27/2011   Total Time spent with patient: 20 minutes  Past Medical History:  Past Medical History  Diagnosis Date  . Arthritis   . Substance abuse     Cocaine, marijuana, and alcohol  . Suicidal ideation 09/09/2012  . Chronic blood loss anemia 09/09/2012  . Rectal bleeding 09/08/2012  . Acute alcoholic pancreatitis 09/08/2012  . Depression   . Hyperthyroidism     Past Surgical History  Procedure Laterality Date  . Orthopedic surgery      Left foot surgery  . Left foot surgery    . Left cataract extraction     Family History:  Family History  Problem Relation Age of Onset  . Heart disease Mother   . Cancer Father     prostate   Social History:  History  Alcohol Use  . 28.8 oz/week  . 48 Cans of beer per week     History  Drug Use  .  Yes  . Special: Marijuana, Cocaine    History   Social History  . Marital Status: Single    Spouse Name: N/A  . Number of Children: 2  . Years of Education: 12   Occupational History  . Quarry manager.    Social History Main Topics  . Smoking status: Former Smoker -- 0.25 packs/day for 10 years    Types: Cigarettes  .  Smokeless tobacco: Former Neurosurgeon    Quit date: 09/16/2012  . Alcohol Use: 28.8 oz/week    48 Cans of beer per week  . Drug Use: Yes    Special: Marijuana, Cocaine  . Sexual Activity: No   Other Topics Concern  . None   Social History Narrative   Widowed.  Wife died of leukemia.  2 children.  [place].   Additional History:    Sleep:  Fair , but improving  Appetite:  Good  Assessment:   Musculoskeletal: Strength & Muscle Tone: within normal limits Gait & Station: normal Patient leans: N/A  Psychiatric Specialty Exam: Physical Exam  Constitutional: He is oriented to person, place, and time.  Neck: Normal range of motion.  Respiratory: Effort normal and breath sounds normal.  Musculoskeletal: Normal range of motion.  Neurological: He is alert and oriented to person, place, and time.    Review of Systems  Constitutional: Positive for weight loss.  Psychiatric/Behavioral: Positive for depression and suicidal ideas. Negative for hallucinations, memory loss and substance abuse. The patient is nervous/anxious and has insomnia.    Constitutional: Positive for weight loss and malaise/fatigue.  HENT:   Mass on anterior aspect of neck- likely thyroid enlargement  Eyes: Negative.  Respiratory: Negative.   Shortness of breath upon exertion  Cardiovascular: Negative.  Gastrointestinal: Positive for nausea and blood in stool.   No rectal bleeding or melenas at this time  Genitourinary: Negative.  Musculoskeletal: Negative.  Skin: Negative.  Neurological: Positive for headaches. Negative for seizures.  Endo/Heme/Allergies: Negative.  Psychiatric/Behavioral: Positive for depression, suicidal ideas and substance abuse.   ( sober x 6 months )  all other systems negative   Blood pressure 115/61, pulse 98, temperature 98.3 F (36.8 C), temperature source Oral, resp. rate 16, height 6\' 2"  (1.88 m), weight 112 lb (50.803 kg), SpO2 100  %.Body mass index is 14.37 kg/(m^2).  General Appearance: grooming is improving.  Eye Contact::  Good  Speech:  Clear and Coherent and Normal Rate  Volume:  Normal  Mood:   Reports ongoing depression, but acknowledges some improvement  Affect:    Less constricted, does remain anxious  Thought Process:  Denies hallucinations, delusions, and paranoia  Orientation:  Full (Time, Place, and Person)  Thought Content:  Rumination about physical health, how he will navigate outpatient care, concerns about homelessness  - no hallucinations,no delusions   Suicidal Thoughts:  Denies at this time  Homicidal Thoughts:  Denies at this time  Memory:  Immediate;   Good Recent;   Good Remote;   Good  Judgement:  Other:  improved   Insight:  Fair and improved  Psychomotor Activity:   Improving, less isolative   Concentration:  Fair  Recall:  Good  Fund of Knowledge:Good  Language: Good  Akathisia:  Negative  Handed:  Right  AIMS (if indicated):     Assets:  Communication Skills Desire for Improvement  ADL's:  Intact  Cognition: WNL  Sleep:  Number of Hours: 5     Current Medications: Current Facility-Administered Medications  Medication Dose Route Frequency Provider Last Rate Last Dose  . acetaminophen (TYLENOL) tablet 650 mg  650 mg Oral Q6H PRN Worthy FlankIjeoma E Nwaeze, NP   650 mg at 06/28/15 0935  . alum & mag hydroxide-simeth (MAALOX/MYLANTA) 200-200-20 MG/5ML suspension 30 mL  30 mL Oral Q4H PRN Worthy FlankIjeoma E Nwaeze, NP      . atenolol (TENORMIN) tablet 25 mg  25 mg Oral Daily Worthy FlankIjeoma E Nwaeze, NP   25 mg at 06/29/15 0821  . feeding supplement (ENSURE ENLIVE) (ENSURE ENLIVE) liquid 237 mL  237 mL Oral TID BM Mena GoesSpencer E Simon, PA-C   237 mL at 06/29/15 1334  . hydrOXYzine (ATARAX/VISTARIL) tablet 25 mg  25 mg Oral Q6H PRN Worthy FlankIjeoma E Nwaeze, NP   25 mg at 06/26/15 2054  . ibuprofen (ADVIL,MOTRIN) tablet 600 mg  600 mg Oral Q6H PRN Kerry HoughSpencer E Simon, PA-C      . magnesium hydroxide (MILK OF MAGNESIA)  suspension 30 mL  30 mL Oral Daily PRN Worthy FlankIjeoma E Nwaeze, NP   30 mL at 06/28/15 1956  . methimazole (TAPAZOLE) tablet 5 mg  5 mg Oral BID Worthy FlankIjeoma E Nwaeze, NP   5 mg at 06/29/15 [id number]  . mirtazapine (REMERON) tablet 30 mg  30 mg Oral QHS Craige CottaFernando A Cobos, MD   30 mg at 06/28/15 2221  . multivitamin with minerals tablet 1 tablet  1 tablet Oral Daily Craige CottaFernando A Cobos, MD   1 tablet at 06/29/15 [phone]  . traZODone (DESYREL) tablet 100 mg  100 mg Oral QHS PRN Thermon LeylandLaura A Davis, NP   100 mg at 06/28/15 2347    Lab Results:  No results found for this or any previous visit (from the past 48 hour(s)).  Physical Findings: AIMS: Facial and Oral Movements Muscles of Facial Expression: None, normal Lips and Perioral Area: None, normal Jaw: None, normal Tongue: None, normal,Extremity Movements Upper (arms, wrists, hands, fingers): None, normal Lower (legs, knees, ankles, toes): None, normal, Trunk Movements Neck, shoulders, hips: None, normal, Overall Severity Severity of abnormal movements (highest score from questions above): None, normal Incapacitation due to abnormal movements: None, normal Patient's awareness of abnormal movements (rate only patient's report): No Awareness, Dental Status Current problems with teeth and/or dentures?: Yes Does patient usually wear dentures?: No  CIWA:  CIWA-Ar Total: 1 COWS:  COWS Total Score: 1   Assessment- Patient continues to feel depressed, anxious, and has ongoing ruminations about his physical health and homelessness. Affect, however, is more reactive than upon admission. No SI. Thyroid condition seems to be improving on Tapazole- anterior neck mass decreased in size, appetite , energy improved . Disposition planning is ongoing- due to severity of his medical illness,  its clear relationship/ contribution to his mood disorder and anxiety, and likelihood of worsening if treatment not ongoing after discharge,  it is felt by team that  patient warrants ongoing   inpatient  Treatment in order to continue to manage his depression and to continue to solidify his disposition plan.   Treatment Plan Summary: Daily contact with patient to assess and evaluate symptoms and progress in treatment and Medication management  1. Continue crisis management and stabilization 2. Increase  Remeron  To 45  mgs QHS  for depression and Insomnia  3. Continue  Atenolol 25 mg daily and Tapazole 5 mg BID- to address hyperthyroidism .  4. Continue  Ensure Supplement to improve nutritional status . 5.   Continue Trazodone  100 mgrs QHS PRN Insomnia . 6.  Disposition plan in progress- at this time pending appointment date to see outpatient endocrinologist, Dr. Sharl Ma soon after discharge, and referral in process to [facility], to address homelessness.  Medical Decision Making:  Established Problem, Stable/Improving (1), Review of Psycho-Social Stressors (1), Review or order clinical lab tests (1), Review of Medication Regimen & Side Effects (2) and Review of New Medication or Change in Dosage (2)  Nehemiah Massed, MD  06/29/2015, 3:29 PM

## 2015-06-29 NOTE — Progress Notes (Signed)
D: Pt at this time is alert and oriented x 4 and very cooperative. Pt is very optimistic and excited with the conversations he had with both the social worker and the psychiatrist today; he states, "I feel very satisfied with the conversation I had with the doctor and the social worker today; they are really trying to find me a place." Pt denies pain, denies SI, HI, and any AVH. He had a wonderful time at [employer] today; she states, "This is the first time going; I had a wonderful time." A: Medications administered as prescribed.  Support, encouragement, and safe environment provided.  15-minute safety checks continue. R: Pt was med compliant.  Safety checks continue.

## 2015-06-30 DIAGNOSIS — F339 Major depressive disorder, recurrent, unspecified: Secondary | ICD-10-CM

## 2015-06-30 LAB — BASIC METABOLIC PANEL
ANION GAP: 6 (ref 5–15)
BUN: 17 mg/dL (ref 6–20)
CO2: 24 mmol/L (ref 22–32)
Calcium: 9.6 mg/dL (ref 8.9–10.3)
Chloride: 108 mmol/L (ref 101–111)
Creatinine, Ser: 0.62 mg/dL (ref 0.61–1.24)
GFR calc Af Amer: >60 mL/min (ref >60)
GFR calc non Af Amer: >60 mL/min (ref >60)
Glucose, Bld: 91 mg/dL (ref 65–99)
Potassium: 4.5 mmol/L (ref 3.5–5.1)
Sodium: 138 mmol/L (ref 135–145)

## 2015-06-30 LAB — CBC WITH DIFFERENTIAL/PLATELET
Basophils Absolute: 0 10*3/uL (ref 0.0–0.1)
Basophils Relative: 0 % (ref 0–1)
EOS ABS: 0.2 10*3/uL (ref 0.0–0.7)
EOS PCT: 2 % (ref 0–5)
HCT: 31 % — ABNORMAL LOW (ref 39.0–52.0)
HEMOGLOBIN: 9.6 g/dL — AB (ref 13.0–17.0)
Lymphocytes Relative: 31 % (ref 12–46)
Lymphs Abs: 2.4 10*3/uL (ref 0.7–4.0)
MCH: 26 pg (ref 26.0–34.0)
MCHC: 31 g/dL (ref 30.0–36.0)
MCV: 84 fL (ref 78.0–100.0)
MONO ABS: 0.6 10*3/uL (ref 0.1–1.0)
MONOS PCT: 7 % (ref 3–12)
Neutro Abs: 4.7 10*3/uL (ref 1.7–7.7)
Neutrophils Relative %: 60 % (ref 43–77)
Platelets: 161 10*3/uL (ref 150–400)
RBC: 3.69 MIL/uL — ABNORMAL LOW (ref 4.22–5.81)
RDW: 16.5 % — ABNORMAL HIGH (ref 11.5–15.5)
WBC: 7.8 10*3/uL (ref 4.0–10.5)

## 2015-06-30 NOTE — Progress Notes (Signed)
Patient ID: Phillip Kemp, male   DOB: 1961-10-27, 54 y.o.   MRN: [id number] Patient ID: Phillip Kemp, male   DOB: [birth year], 54 y.o.   MRN: [id number] [facility] MD Progress Note  06/30/2015 7:34 PM Phillip Kemp  MRN:  [id number]   Subjective:  Phillip Kemp says, "I'm feeling a lot better today".  Objective:  Patient seen, case discussed with Treatment team. [name] is visible on the unit. He is participating in the group milieu. He denies any side effects from his  Medications. He says he is eating quite a bit and has gained 5 pounds already. He says he is praying a lot and will remain hopeful that he will get better.   Principal Problem: Major depression, recurrent, chronic Diagnosis:   Patient Active Problem List   Diagnosis Date Noted  . Major depression, recurrent, chronic [F33.9] 06/22/2015  . Depressed [F32.9]   . Alcohol abuse [F10.10] 03/20/2015  . Severe recurrent major depression without psychotic features [F33.2] 03/20/2015  . Suicidal ideations [R45.851] 03/20/2015  . Hyperthyroidism [E05.90] 11/25/2014  . Alcohol use disorder, severe, dependence [F10.20] 11/22/2014  . Thyroid disease [E07.9] 11/22/2014  . Graves' disease [E05.00] 11/22/2014  . Bipolar 1 disorder, depressed, severe [F31.4] 11/21/2014  . Aspiration pneumonia [J69.0] 07/15/2013  . Hypokalemia [E87.6] 07/15/2013  . Elevated LFTs [R79.89] 07/15/2013  . Microcytic anemia [D50.9] 07/15/2013  . Thrombocytopenia [D69.6] 07/15/2013  . 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 Adrenal nodule, left [E27.9] 07/15/2013  . Chest pain, midsternal [R07.2] 09/24/2012  . Depression [F32.9] 09/11/2012  . Papules [L98.9] 09/10/2012  . Suicidal ideation [R45.851] 09/09/2012  . Chronic blood loss anemia [D50.0] 09/09/2012  . Acute alcoholic pancreatitis [M35.5] 09/08/2012  . Rectal bleeding [K62.5] 09/08/2012  . Cocaine abuse [F14.10] 09/08/2012  . Anemia [D64.9] 09/08/2012  . Left adrenal mass [E27.9] 09/08/2012  .  Pseudogout of knee [M11.869] 08/27/2011  . Substance abuse [F19.10] 08/27/2011  . Chronic pain [G89.29] 08/27/2011   Total Time spent with patient: 20 minutes  Past Medical History:  Past Medical History  Diagnosis Date  . Arthritis   . Substance abuse     Cocaine, marijuana, and alcohol  . Suicidal ideation 09/09/2012  . Chronic blood loss anemia 09/09/2012  . Rectal bleeding 09/08/2012  . Acute alcoholic pancreatitis 9/74/1638  . Depression   . Hyperthyroidism     Past Surgical History  Procedure Laterality Date  . Orthopedic surgery      Left foot surgery  . Left foot surgery    . Left cataract extraction     Family History:  Family History  Problem Relation Age of Onset  . Heart disease Mother   . Cancer Father     prostate   Social History:  History  Alcohol Use  . 28.8 oz/week  . 48 Cans of beer per week     History  Drug Use  . Yes  . Special: Marijuana, Cocaine    History   Social History  . Marital Status: Single    Spouse Name: N/A  . Number of Children: 2  . Years of Education: 12   Occupational History  . Teaching laboratory technician.    Social History Main Topics  . Smoking status: Former Smoker -- 0.25 packs/day for 10 years    Types: Cigarettes  . Smokeless tobacco: Former Systems developer    Quit date: 09/16/2012  . Alcohol Use: 28.8 oz/week    48 Cans of beer per week  . Drug Use: Yes  Special: Marijuana, Cocaine  . Sexual Activity: No   Other Topics Concern  . None   Social History Narrative   Widowed.  Wife died of leukemia.  2 children.  Lives in the [place].   Additional History:    Sleep:  Fair   Appetite:  Good  Assessment:   Musculoskeletal: Strength & Muscle Tone: within normal limits Gait & Station: normal Patient leans: N/A  Psychiatric Specialty Exam: Physical Exam  Constitutional: He is oriented to person, place, and time.  Neck: Normal range of motion.  Respiratory: Effort normal and breath sounds normal.   Musculoskeletal: Normal range of motion.  Neurological: He is alert and oriented to person, place, and time.    Review of Systems  Constitutional: Positive for weight loss.  Psychiatric/Behavioral: Positive for depression and suicidal ideas. Negative for hallucinations, memory loss and substance abuse. The patient is nervous/anxious and has insomnia.    Constitutional: Positive for weight loss and malaise/fatigue.  HENT:   Mass on anterior aspect of neck- likely thyroid enlargement  Eyes: Negative.  Respiratory: Negative.   Shortness of breath upon exertion  Cardiovascular: Negative.  Gastrointestinal: Positive for nausea and blood in stool.   No rectal bleeding or melenas at this time  Genitourinary: Negative.  Musculoskeletal: Negative.  Skin: Negative.  Neurological: Positive for headaches. Negative for seizures.  Endo/Heme/Allergies: Negative.  Psychiatric/Behavioral: Positive for depression, suicidal ideas and substance abuse.   ( sober x 6 months )  all other systems negative   Blood pressure 111/58, pulse 91, temperature 98 F (36.7 C), temperature source Oral, resp. rate 18, height _0  (1.88 m), weight 50.803 kg (112 lb), SpO2 100 %.Body mass index is 14.37 kg/(m^2).  General Appearance: grooming is improving.  Eye Contact::  Good  Speech:  Clear and Coherent and Normal Rate  Volume:  Normal  Mood:   acknowledges some improvement  Affect: Good, smiling, hopeful  Thought Process:  Denies hallucinations, delusions, and paranoia  Orientation:  Full (Time, Place, and Person)  Thought Content:  Rumination about physical health, how he will navigate outpatient care, concerns about homelessness  - no hallucinations,no delusions   Suicidal Thoughts:  Denies at this time  Homicidal Thoughts:  Denies at this time  Memory:  Immediate;   Good Recent;   Good Remote;   Good  Judgement:  Other:  improved   Insight:  Fair and improved  Psychomotor  Activity:   Improving, participating in group sessions  Concentration:  Fair  Recall:  Matthews of Knowledge:Good  Language: Good  Akathisia:  Negative  Handed:  Right  AIMS (if indicated):     Assets:  Communication Skills Desire for Improvement  ADL's:  Intact  Cognition: WNL  Sleep:  Number of Hours: 5    Current Medications: Current Facility-Administered Medications  Medication Dose Route Frequency Provider Last Rate Last Dose  . acetaminophen (TYLENOL) tablet 650 mg  650 mg Oral Q6H PRN Harriet Butte, NP   650 mg at 06/28/15 0935  . alum & mag hydroxide-simeth (MAALOX/MYLANTA) 200-200-20 MG/5ML suspension 30 mL  30 mL Oral Q4H PRN Harriet Butte, NP   30 mL at 06/30/15 1839  . atenolol (TENORMIN) tablet 25 mg  25 mg Oral Daily Harriet Butte, NP   25 mg at 06/30/15 0759  . feeding supplement (ENSURE ENLIVE) (ENSURE ENLIVE) liquid 237 mL  237 mL Oral TID BM Laverle Hobby, PA-C   237 mL at 06/30/15 1508  .  hydrOXYzine (ATARAX/VISTARIL) tablet 25 mg  25 mg Oral Q6H PRN Harriet Butte, NP   25 mg at 06/26/15 2054  . ibuprofen (ADVIL,MOTRIN) tablet 600 mg  600 mg Oral Q6H PRN Laverle Hobby, PA-C      . magnesium hydroxide (MILK OF MAGNESIA) suspension 30 mL  30 mL Oral Daily PRN Harriet Butte, NP   30 mL at 06/28/15 1956  . methimazole (TAPAZOLE) tablet 5 mg  5 mg Oral BID Harriet Butte, NP   5 mg at 06/30/15 1715  . mirtazapine (REMERON) tablet 30 mg  30 mg Oral QHS Jenne Campus, MD   30 mg at 06/28/15 2221  . multivitamin with minerals tablet 1 tablet  1 tablet Oral Daily Jenne Campus, MD   1 tablet at 06/30/15 0759  . traZODone (DESYREL) tablet 100 mg  100 mg Oral QHS PRN Niel Hummer, NP   100 mg at 06/29/15 2228    Lab Results:  Results for orders placed or performed during the hospital encounter of 06/22/15 (from the past 48 hour(s))  CBC with Differential/Platelet     Status: Abnormal   Collection Time: 06/30/15  6:30 AM  Result Value Ref Range   WBC  7.8 4.0 - 10.5 K/uL   RBC 3.69 (L) 4.22 - 5.81 MIL/uL   Hemoglobin 9.6 (L) 13.0 - 17.0 g/dL   HCT 31.0 (L) 39.0 - 52.0 %   MCV 84.0 78.0 - 100.0 fL   MCH 26.0 26.0 - 34.0 pg   MCHC 31.0 30.0 - 36.0 g/dL   RDW 16.5 (H) 11.5 - 15.5 %   Platelets 161 150 - 400 K/uL   Neutrophils Relative % 60 43 - 77 %   Neutro Abs 4.7 1.7 - 7.7 K/uL   Lymphocytes Relative 31 12 - 46 %   Lymphs Abs 2.4 0.7 - 4.0 K/uL   Monocytes Relative 7 3 - 12 %   Monocytes Absolute 0.6 0.1 - 1.0 K/uL   Eosinophils Relative 2 0 - 5 %   Eosinophils Absolute 0.2 0.0 - 0.7 K/uL   Basophils Relative 0 0 - 1 %   Basophils Absolute 0.0 0.0 - 0.1 K/uL    Comment: Performed at [facility] metabolic panel     Status: None   Collection Time: 06/30/15  6:30 AM  Result Value Ref Range   Sodium 138 135 - 145 mmol/L   Potassium 4.5 3.5 - 5.1 mmol/L   Chloride 108 101 - 111 mmol/L   CO2 24 22 - 32 mmol/L   Glucose, Bld 91 65 - 99 mg/dL   BUN 17 6 - 20 mg/dL   Creatinine, Ser 0.62 0.61 - 1.24 mg/dL   Calcium 9.6 8.9 - 10.3 mg/dL   GFR calc non Af Amer >60 >60 mL/min   GFR calc Af Amer >60 >60 mL/min    Comment: (NOTE) The eGFR has been calculated using the CKD EPI equation. This calculation has not been validated in all clinical situations. eGFR's persistently <60 mL/min signify possible Chronic Kidney Disease.    Anion gap 6 5 - 15    Comment: Performed at [facility]    Physical Findings: AIMS: Facial and Oral Movements Muscles of Facial Expression: None, normal Lips and Perioral Area: None, normal Jaw: None, normal Tongue: None, normal,Extremity Movements Upper (arms, wrists, hands, fingers): None, normal Lower (legs, knees, ankles, toes): None, normal, Trunk Movements Neck, shoulders, hips: None, normal, Overall  Severity Severity of abnormal movements (highest score from questions above): None, normal Incapacitation due to abnormal movements: None,  normal Patient's awareness of abnormal movements (rate only patient's report): No Awareness, Dental Status Current problems with teeth and/or dentures?: Yes Does patient usually wear dentures?: No  CIWA:  CIWA-Ar Total: 1 COWS:  COWS Total Score: 1   Assessment- Patient says he is feeling some better, less anxious, continue to ruminate about his physical health and homelessness. Affect is good. No SI. Thyroid condition seems to be improving on Tapazole- anterior neck mass decreased in size, appetite,  energy improved . Disposition planning is ongoing- due to severity of his medical illness,  its clear relationship/ contribution to his mood disorder and anxiety, and likelihood of worsening if treatment not ongoing after discharge,  it is felt by team that  patient warrants ongoing  inpatient  Treatment in order to continue to manage his depression and to continue to solidify his disposition plan.  Treatment Plan Summary: Daily contact with patient to assess and evaluate symptoms and progress in treatment and Medication management 1. Continue crisis management and stabilization 2. Continue 45  mgs QHS  for depression and Insomnia  3. Continue  Atenolol 25 mg daily and Tapazole 5 mg BID- to address hyperthyroidism .  4. Continue  Ensure Supplement to improve nutritional status . 5.   Continue Trazodone  100 mgrs QHS PRN Insomnia . 6.   Disposition plan in progress- at this time pending appointment date to see outpatient endocrinologist, Dr. Buddy Duty soon after discharge, and referral in process to [facility], to address homelessness.  Medical Decision Making:  Established Problem, Stable/Improving (1), Review of Psycho-Social Stressors (1), Review or order clinical lab tests (1), Review of Medication Regimen & Side Effects (2) and Review of New Medication or Change in Dosage (2)  Encarnacion Slates, PMHNP-BC 06/30/2015, 7:34 PM I agree with assessment and plan Geralyn Flash A. Sabra Heck, M.D.

## 2015-06-30 NOTE — [facility] Group Notes (Signed)
[facility] LCSW Group Therapy  06/30/2015  1:15 PM  Type of Therapy:  Group Therapy  Participation Level:  Active  Participation Quality:  Appropriate and Drowsy  Affect:  Flat  Cognitive:  Appropriate  Insight:  Developing/Improving  Engagement in Therapy:  Limited  Modes of Intervention:  Discussion, Exploration, Socialization and Support  Summary of Progress/Problems: Summary of Progress/Problems: The main focus of today's process group was to learn how to use a decisional balance exercise to move forward in the Stages of Change, which were described and discussed. Motivational Interviewing and a worksheet were utilized to help patients explore in depth the perceived benefits and costs of unhealthy coping techniques, as well as the benefits and costs of replacing that with a healthy coping skills.Phillip SalvoRaymond shared during warm up that he is "looking forward to learning more about myself" before it appeared that he may have nodded off as evidenced by closed eyes and head tilted to side. Patient later engaged towards end of group and shared that he is willing to seek more support and [facility] speaker from yesterday. Patient reports he is "highly motivated to think and do something positive everyday as my negative thinking is getting me nowhere."  Phillip Kemp, [name]

## 2015-06-30 NOTE — Progress Notes (Signed)
Adult Psychoeducational Group Note  Date:  06/30/2015 Time:  9:11 PM  Group Topic/Focus:  Wrap-Up Group:   The focus of this group is to help patients review their daily goal of treatment and discuss progress on daily workbooks.  Participation Level:  Active  Participation Quality:  Appropriate and Attentive  Affect:  Appropriate  Cognitive:  Appropriate  Insight: Appropriate  Engagement in Group:  Engaged  Modes of Intervention:  Discussion  Additional Comments:  Pt stated his goal for today was to work on his discharge plan and to work on what is wrong with me. Pt states he has worked on just about everything and only has one thing left he wants to work on. Pt did not elaborate on what that was. Pt stated his goal for tomorrow is to get good rest tonight.  Caswell CorwinOwen, Ericson Nafziger C 06/30/2015, 9:11 PM

## 2015-06-30 NOTE — Progress Notes (Signed)
Writer has observed patient up in the dayroom interacting appropriately with peers, reports having had a good day and he rested well on last night after taking the trazadone. He denies si/hi/a/v hallucinations. He attended group this evening and reports that he does not want the remeron for bedtime but would rather have trazadone. Support and encouragement given, safety maintained on unit with 15 min checks.

## 2015-06-30 NOTE — Progress Notes (Signed)
Psychoeducational Group Note  Date:  07/24/2012 Time: 1015  Group Topic/Focus:  Identifying Needs:   The focus of this group is to help patients identify their personal needs that have been historically problematic and identify healthy behaviors to address their needs.   Participation Level:  Active  Participation Quality: good  Affect: flat  Cognitive:  intact  Insight:  good  Engagement in Group: engaged  Additional Comments:   PDuke RN [employer]

## 2015-06-30 NOTE — Progress Notes (Signed)
Phillip Kemp  Is seen .Marland Kitchen. Out in the milieu ...interacting with his peers, tolerating well. He takes his scheduled meds as ordered and he completes his daily assessment this moring..N/A thing and on it he wrotes he denies SI and he rates his depression, hopelessness and anxiety " 6/6/6/", respectively.     A He attends all of his groups and is engaged in his recovery  as evidenced by  His participation, his questions and statements about what he can change " not my disease...but my choices...".     R Safety cont.

## 2015-06-30 NOTE — [facility] Group Notes (Signed)
[facility] Group Notes:  (Nursing/MHT/Case Management/Adjunct)  Date:  06/30/2015  Time:  1:41 PM  Type of Therapy:  Goal Setting: The focus of this group is to educate patients about how to set healthy goals as well as identify skills needed to establish these goals.  ' Participation Level:  Active  Participation Quality:  Attentive  Affect:  Appropriate  Cognitive:  Appropriate  Insight:  Appropriate  Engagement in Group:  Engaged  Modes of Intervention:  Education  Summary of Progress/Problems:  Rich BraveDuke, Capricia Serda Lynn 06/30/2015, 1:41 PM

## 2015-07-01 MED ORDER — ALLOPURINOL 100 MG PO TABS
50.0000 mg | ORAL_TABLET | Freq: Every day | ORAL | Status: AC
  Administered 2015-07-01 – 2015-07-03 (×3): 50 mg via ORAL
  Filled 2015-07-01 (×4): qty 0.5

## 2015-07-01 NOTE — [facility] Group Notes (Addendum)
[facility] Group Notes:  (Nursing/MHT/Case Management/Adjunct)  Date:  07/01/2015  Time: 0930   Type of Therapy: Relaxation Group : This group is focused on helping patients learn the art of relaxation and experience how beneficial it can be in their lives.   Participation Level: Good   Participation Quality:  Good  Affect:  Flat  Cognitive:  Intact  Insight:  Fair  Engagement in Group: Engaged  Modes of Intervention: Talking   Summary of Progress/Problems:  Phillip BraveDuke, Phillip Kemp 07/01/2015, 1:13 PM

## 2015-07-01 NOTE — Progress Notes (Signed)
Adult Psychoeducational Group Note  Date:  07/01/2015 Time:  9:39 PM  Group Topic/Focus:  Wrap-Up Group:   The focus of this group is to help patients review their daily goal of treatment and discuss progress on daily workbooks.  Participation Level:  Active  Participation Quality:  Appropriate and Attentive  Affect:  Appropriate  Cognitive:  Appropriate  Insight: Appropriate  Engagement in Group:  Engaged  Modes of Intervention:  Discussion  Additional Comments:  Pt stated his goal for today was to learn about his emotions and how to control them before taking action. Pt stated his goal for tomorrow is to learn more about his sobriety since he has been 10 months clean and wants to keep his sobriety up.  Caswell CorwinOwen, Shadonna Benedick C 07/01/2015, 9:39 PM

## 2015-07-01 NOTE — Progress Notes (Signed)
Writer has observed patient up in the dayroom watching tv and interacting with select peers appropriately. He reports tha this day has been good and he is waiting on placement once discharged. He reports that he is ok with being here until placement is found because of his graves' disease and being homeless in the hot conditions. He attended group this evening and participated. He denies si/hi/a/v hallucinations. Support and encouragement given, safety maintained on unit with 15 min checks.

## 2015-07-01 NOTE — [facility] Group Notes (Signed)
[facility] Group Notes:  (Nursing/MHT/Case Management/Adjunct)  Date:  07/01/2015  Time:  1030  Type of Therapy:  Life Skills  Participation Level:  Good  Participation Quality:  Good  Affect:  Flat  Cognitive:  Intact  Insight:  Intact  Engagement in Group: Engaged   Modes of Intervention:    Summary of Progress/Problems:  Rich BraveDuke, Algis Lehenbauer Lynn 07/01/2015, 2:29 PM

## 2015-07-01 NOTE — Progress Notes (Signed)
Patient ID: Phillip Kemp, male   DOB: 05-29-1961, 54 y.o.   MRN: [id number] Patient ID: Phillip Kemp, male   DOB: 1961/06/17, 54 y.o.   MRN: [id number] Patient ID: Phillip Kemp, male   DOB: Sep 10, 1961, 54 y.o.   MRN: [id number] [employer] MD Progress Note  07/01/2015 4:58 PM Phillip Kemp  MRN:  [id number]   Subjective:  Phillip Kemp says, "I got gout pain to my right foot".  Objective:  Patient seen, case discussed with Treatment team. Phillip Kemp is visible on the unit. He is participating in the group milieu. He denies any side effects from his  Medications. He says he is eating quite a bit and has gained 5 pounds already. He says he is praying a lot and will remain hopeful that he will get better. Today, he is complaining of gout pain to right area. Right foot slightly swollen. Says it was gout because he has history.   Principal Problem: Major depression, recurrent, chronic Diagnosis:   Patient Active Problem List   Diagnosis Date Noted  . Major depression, recurrent, chronic [F33.9] 06/22/2015  . Depressed [F32.9]   . Alcohol abuse [F10.10] 03/20/2015  . Severe recurrent major depression without psychotic features [F33.2] 03/20/2015  . Suicidal ideations [R45.851] 03/20/2015  . Hyperthyroidism [E05.90] 11/25/2014  . Alcohol use disorder, severe, dependence [F10.20] 11/22/2014  . Thyroid disease [E07.9] 11/22/2014  . Graves' disease [E05.00] 11/22/2014  . Bipolar 1 disorder, depressed, severe [F31.4] 11/21/2014  . Aspiration pneumonia [J69.0] 07/15/2013  . Hypokalemia [E87.6] 07/15/2013  . Elevated LFTs [R79.89] 07/15/2013  . Microcytic anemia [D50.9] 07/15/2013  . Thrombocytopenia [D69.6] 07/15/2013  . 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 Adrenal nodule, left [E27.9] 07/15/2013  . Chest pain, midsternal [R07.2] 09/24/2012  . Depression [F32.9] 09/11/2012  . Papules [L98.9] 09/10/2012  . Suicidal ideation [R45.851] 09/09/2012  . Chronic blood loss anemia [D50.0] 09/09/2012  .  Acute alcoholic pancreatitis [K35.4] 09/08/2012  . Rectal bleeding [K62.5] 09/08/2012  . Cocaine abuse [F14.10] 09/08/2012  . Anemia [D64.9] 09/08/2012  . Left adrenal mass [E27.9] 09/08/2012  . Pseudogout of knee [M11.869] 08/27/2011  . Substance abuse [F19.10] 08/27/2011  . Chronic pain [G89.29] 08/27/2011   Total Time spent with patient: 20 minutes  Past Medical History:  Past Medical History  Diagnosis Date  . Arthritis   . Substance abuse     Cocaine, marijuana, and alcohol  . Suicidal ideation 09/09/2012  . Chronic blood loss anemia 09/09/2012  . Rectal bleeding 09/08/2012  . Acute alcoholic pancreatitis 6/56/8127  . Depression   . Hyperthyroidism     Past Surgical History  Procedure Laterality Date  . Orthopedic surgery      Left foot surgery  . Left foot surgery    . Left cataract extraction     Family History:  Family History  Problem Relation Age of Onset  . Heart disease Mother   . Cancer Father     prostate   Social History:  History  Alcohol Use  . 28.8 oz/week  . 48 Cans of beer per week     History  Drug Use  . Yes  . Special: Marijuana, Cocaine    History   Social History  . Marital Status: Single    Spouse Name: N/A  . Number of Children: 2  . Years of Education: 12   Occupational History  . Teaching laboratory technician.    Social History Main Topics  . Smoking status: Former Smoker -- 0.25 packs/day for 10 years  Types: Cigarettes  . Smokeless tobacco: Former Systems developer    Quit date: 09/16/2012  . Alcohol Use: 28.8 oz/week    48 Cans of beer per week  . Drug Use: Yes    Special: Marijuana, Cocaine  . Sexual Activity: No   Other Topics Concern  . None   Social History Narrative   Widowed.  Wife died of leukemia.  2 children.  Lives in the [place].   Additional History:    Sleep:  Fair   Appetite:  Good  Assessment:   Musculoskeletal: Strength & Muscle Tone: within normal limits Gait & Station: normal Patient leans:  N/A  Psychiatric Specialty Exam: Physical Exam  Constitutional: He is oriented to person, place, and time.  Neck: Normal range of motion.  Respiratory: Effort normal and breath sounds normal.  Musculoskeletal: Normal range of motion.  Neurological: He is alert and oriented to person, place, and time.    Review of Systems  Constitutional: Positive for weight loss.  Psychiatric/Behavioral: Positive for depression and suicidal ideas. Negative for hallucinations, memory loss and substance abuse. The patient is nervous/anxious and has insomnia.    Constitutional: Positive for weight loss and malaise/fatigue.  HENT:   Mass on anterior aspect of neck- likely thyroid enlargement  Eyes: Negative.  Respiratory: Negative.   Shortness of breath upon exertion  Cardiovascular: Negative.  Gastrointestinal: Positive for nausea and blood in stool.   No rectal bleeding or melenas at this time  Genitourinary: Negative.  Musculoskeletal: Negative.  Skin: Negative.  Neurological: Positive for headaches. Negative for seizures.  Endo/Heme/Allergies: Negative.  Psychiatric/Behavioral: Positive for depression, suicidal ideas and substance abuse.   ( sober x 6 months )  all other systems negative   Blood pressure 117/54, pulse 98, temperature 98.6 F (37 C), temperature source Oral, resp. rate 16, height 6' 2" (1.88 m), weight 50.803 kg (112 lb), SpO2 100 %.Body mass index is 14.37 kg/(m^2).  General Appearance: grooming is improving.  Eye Contact::  Good  Speech:  Clear and Coherent and Normal Rate  Volume:  Normal  Mood:   acknowledges some improvement  Affect: Good, smiling, hopeful  Thought Process:  Denies hallucinations, delusions, and paranoia  Orientation:  Full (Time, Place, and Person)  Thought Content:  Rumination about physical health, how he will navigate outpatient care, concerns about homelessness  - no hallucinations,no delusions   Suicidal Thoughts:   Denies at this time  Homicidal Thoughts:  Denies at this time  Memory:  Immediate;   Good Recent;   Good Remote;   Good  Judgement:  Other:  improved   Insight:  Fair and improved  Psychomotor Activity:   Improving, participating in group sessions  Concentration:  Fair  Recall:  Good  Fund of Knowledge:Good  Language: Good  Akathisia:  Negative  Handed:  Right  AIMS (if indicated):     Assets:  Communication Skills Desire for Improvement  ADL's:  Intact  Cognition: WNL  Sleep:  Number of Hours: 6.25    Current Medications: Current Facility-Administered Medications  Medication Dose Route Frequency Provider Last Rate Last Dose  . acetaminophen (TYLENOL) tablet 650 mg  650 mg Oral Q6H PRN Harriet Butte, NP   650 mg at 06/28/15 0935  . allopurinol (ZYLOPRIM) tablet 50 mg  50 mg Oral Daily Encarnacion Slates, NP      . alum & mag hydroxide-simeth (MAALOX/MYLANTA) 200-200-20 MG/5ML suspension 30 mL  30 mL Oral Q4H PRN Harriet Butte, NP   30  mL at 06/30/15 1839  . atenolol (TENORMIN) tablet 25 mg  25 mg Oral Daily Harriet Butte, NP   25 mg at 07/01/15 4627  . feeding supplement (ENSURE ENLIVE) (ENSURE ENLIVE) liquid 237 mL  237 mL Oral TID BM Maurine Minister Simon, PA-C   237 mL at 07/01/15 1415  . hydrOXYzine (ATARAX/VISTARIL) tablet 25 mg  25 mg Oral Q6H PRN Harriet Butte, NP   25 mg at 06/26/15 2054  . ibuprofen (ADVIL,MOTRIN) tablet 600 mg  600 mg Oral Q6H PRN Laverle Hobby, PA-C      . magnesium hydroxide (MILK OF MAGNESIA) suspension 30 mL  30 mL Oral Daily PRN Harriet Butte, NP   30 mL at 06/28/15 1956  . methimazole (TAPAZOLE) tablet 5 mg  5 mg Oral BID Harriet Butte, NP   5 mg at 07/01/15 0350  . mirtazapine (REMERON) tablet 30 mg  30 mg Oral QHS Jenne Campus, MD   30 mg at 06/28/15 2221  . multivitamin with minerals tablet 1 tablet  1 tablet Oral Daily Jenne Campus, MD   1 tablet at 07/01/15 0938  . traZODone (DESYREL) tablet 100 mg  100 mg Oral QHS PRN Niel Hummer, NP   100 mg at 06/30/15 2214    Lab Results:  Results for orders placed or performed during the hospital encounter of 06/22/15 (from the past 48 hour(s))  CBC with Differential/Platelet     Status: Abnormal   Collection Time: 06/30/15  6:30 AM  Result Value Ref Range   WBC 7.8 4.0 - 10.5 K/uL   RBC 3.69 (L) 4.22 - 5.81 MIL/uL   Hemoglobin 9.6 (L) 13.0 - 17.0 g/dL   HCT 31.0 (L) 39.0 - 52.0 %   MCV 84.0 78.0 - 100.0 fL   MCH 26.0 26.0 - 34.0 pg   MCHC 31.0 30.0 - 36.0 g/dL   RDW 16.5 (H) 11.5 - 15.5 %   Platelets 161 150 - 400 K/uL   Neutrophils Relative % 60 43 - 77 %   Neutro Abs 4.7 1.7 - 7.7 K/uL   Lymphocytes Relative 31 12 - 46 %   Lymphs Abs 2.4 0.7 - 4.0 K/uL   Monocytes Relative 7 3 - 12 %   Monocytes Absolute 0.6 0.1 - 1.0 K/uL   Eosinophils Relative 2 0 - 5 %   Eosinophils Absolute 0.2 0.0 - 0.7 K/uL   Basophils Relative 0 0 - 1 %   Basophils Absolute 0.0 0.0 - 0.1 K/uL    Comment: Performed at [place] metabolic panel     Status: None   Collection Time: 06/30/15  6:30 AM  Result Value Ref Range   Sodium 138 135 - 145 mmol/L   Potassium 4.5 3.5 - 5.1 mmol/L   Chloride 108 101 - 111 mmol/L   CO2 24 22 - 32 mmol/L   Glucose, Bld 91 65 - 99 mg/dL   BUN 17 6 - 20 mg/dL   Creatinine, Ser 0.62 0.61 - 1.24 mg/dL   Calcium 9.6 8.9 - 10.3 mg/dL   GFR calc non Af Amer >60 >60 mL/min   GFR calc Af Amer >60 >60 mL/min    Comment: (NOTE) The eGFR has been calculated using the CKD EPI equation. This calculation has not been validated in all clinical situations. eGFR's persistently <60 mL/min signify possible Chronic Kidney Disease.    Anion gap 6 5 - 15    Comment:  Performed at [facility]    Physical Findings: AIMS: Facial and Oral Movements Muscles of Facial Expression: None, normal Lips and Perioral Area: None, normal Jaw: None, normal Tongue: None, normal,Extremity Movements Upper (arms, wrists, hands,  fingers): None, normal Lower (legs, knees, ankles, toes): None, normal, Trunk Movements Neck, shoulders, hips: None, normal, Overall Severity Severity of abnormal movements (highest score from questions above): None, normal Incapacitation due to abnormal movements: None, normal Patient's awareness of abnormal movements (rate only patient's report): No Awareness, Dental Status Current problems with teeth and/or dentures?: Yes Does patient usually wear dentures?: No  CIWA:  CIWA-Ar Total: 1 COWS:  COWS Total Score: 1   Assessment- Patient says he is feeling some better, less anxious, continue to ruminate about his physical health and homelessness. Affect is good. No SI. Thyroid condition seems to be improving on Tapazole- anterior neck mass decreased in size, appetite,  energy improved . Disposition planning is ongoing- due to severity of his medical illness,  its clear relationship/ contribution to his mood disorder and anxiety, and likelihood of worsening if treatment not ongoing after discharge,  it is felt by team that  patient warrants ongoing  inpatient  Treatment in order to continue to manage his depression and to continue to solidify his disposition plan.  Treatment Plan Summary: Daily contact with patient to assess and evaluate symptoms and progress in treatment and Medication management Continue crisis management and stabilization              Continue 45  mgs QHS  for depression and Insomnia  1. Continue  Atenolol 25 mg daily and Tapazole 5 mg BID- to address hyperthyroidism .  2. Continue  Ensure Supplement to improve nutritional status . 5.   Continue Trazodone  100 mgrs QHS PRN Insomnia . 6.   Disposition plan in progress- at this time pending appointment date to see outpatient endocrinologist, Dr. Buddy Duty soon after discharge, and referral in process to [facility], to address homelessness. Initiate Allopurinol 50 mg for gout pain x 3 days.  Medical Decision Making:  Established  Problem, Stable/Improving (1), Review of Psycho-Social Stressors (1), Review or order clinical lab tests (1), Review of Medication Regimen & Side Effects (2) and Review of New Medication or Change in Dosage (2)  Encarnacion Slates, PMHNP-BC 07/01/2015, 4:58 PM I agree with assessment and plan Geralyn Flash A. Sabra Heck, M.D.

## 2015-07-01 NOTE — [facility] Group Notes (Signed)
[facility] LCSW Group Therapy  07/01/2015   1:15 PM  Type of Therapy:  Group Therapy  Participation Level:  Active  Participation Quality:  Sharing  Affect:  Flat  Cognitive:  Alert  Insight:  Developing/Improving  Engagement in Therapy:  Developing/Improving  Modes of Intervention:  Discussion, Exploration, Problem-solving, Socialization and Support   Summary of Progress/Problems: The main focus of today's process group was to identify the patient's current support system and decide on other supports that can be put in place. An emphasis was placed on using counselor, doctor, therapy groups, 12-step groups, and problem-specific support groups to expand supports. There was also an extensive discussion about possible fears related to success. Patient shared at multiple points yet needed some redirection to stay on topic. Pt shared that he would like a therapist and to talk with someone who could explain his medications.   Clide DalesHarrill, Catherine Campbell

## 2015-07-02 NOTE — [facility] Group Notes (Signed)
[facility] LCSW Group Therapy  07/02/2015 1:15pm  Type of Therapy:  Group Therapy vercoming Obstacles  Participation Level:  Active  Participation Quality:  Appropriate   Affect:  Appropriate  Cognitive:  Appropriate and Oriented  Insight:  Developing/Improving and Improving  Engagement in Therapy:  Improving  Modes of Intervention:  Discussion, Exploration, Problem-solving and Support  Description of Group:   In this group patients will be encouraged to explore what they see as obstacles to their own wellness and recovery. They will be guided to discuss their thoughts, feelings, and behaviors related to these obstacles. The group will process together ways to cope with barriers, with attention given to specific choices patients can make. Each patient will be challenged to identify changes they are motivated to make in order to overcome their obstacles. This group will be process-oriented, with patients participating in exploration of their own experiences as well as giving and receiving support and challenge from other group members.  Summary of Patient Progress: Pt continues to participate appropriately in group, engaging well with others and offering insightful comments. Pt describes anxiety related to housing problems as his main obstacle at this time. Pt shared with the group how finding clinical and environmental support have helped him to remain positive and sober over the last 10 months. Pt was able to process with facilitator how to manage anxious thoughts when thinking about his housing situation. Pt expressed his desire to be more aware of his negative thought patterns and counter them with positive thinking.   Therapeutic Modalities:   Cognitive Behavioral Therapy Solution Focused Therapy Motivational Interviewing Relapse Prevention Therapy   Chad CordialLauren Carter, LCSWA 07/02/2015 5:49 PM

## 2015-07-02 NOTE — Progress Notes (Signed)
Recreation Therapy Notes  Date: 07.18.16 Time: 9:30 am Location: [address] Group Room  Group Topic: Stress Management  Goal Area(s) Addresses:  Patient will verbalize importance of using healthy stress management.  Patient will identify positive emotions associated with healthy stress management.   Intervention: Stress Management  Activity :  Guided Imagery Script.  LRT introduced and educated patients on the stress management technique of guided imagery.  A script was used to deliver the technique to patients.  Patients were asked to follow the script read a loud by LRT to engage in practicing the stress management technique.  Education:  Stress Management, Discharge Planning.   Education Outcome: Acknowledges edcuation/In group clarification offered/Needs additional education  Clinical Observations/Feedback: Patient did not attend group.   Cherry Turlington, LRT/CTRS         Freya Zobrist A 07/02/2015 2:09 PM 

## 2015-07-02 NOTE — Clinical Social Work Note (Signed)
Patient issued [facility] - [id number] K.  Santa GeneraAnne Letroy Vazguez, LCSW Clinical Social Worker

## 2015-07-02 NOTE — [facility] Group Notes (Signed)
[facility]  07/02/2015 8:45 AM  Participation Quality: Alert, Appropriate and Oriented  Mood/Affect: Appropriate  Depression Rating: 0  Anxiety Rating: 2  Thoughts of Suicide: Pt denies SI/HI  Will you contract for safety? Yes  Current AVH: Pt denies  Plan for Discharge/Comments: Pt attended discharge planning group and actively participated in group. CSW discussed suicide prevention education with the group and encouraged them to discuss discharge planning and any relevant barriers. Pt reports much improved mood but still endorses manageable anxiety related to discharge to the shelter. Pt reports that his physical state has improved as well.  Transportation Means: Pt reports access to transportation  Supports: No supports mentioned at this time  Chad CordialLauren Carter, LCSWA 07/02/2015 1:23 PM

## 2015-07-02 NOTE — Progress Notes (Signed)
D:Patient in the dayroom on approach.  Patient animated and pleasant.  Patient states he has a good day but states he got upset when he was told he would be [facility] to the [facility] and had to sleep in a chair.  Patient states he did not feel like that would be good for him with all of his medical problems.  Patient denies SI/HI and denies AVH. A: Staff to monitor Q 15 mins for safety.  Encouragement and support offered.  Writer attempted to administered scheduled medications  per orders. Patient refused Remeron but took trazodone prn for sleep and vistaril prn for anxiety and sleep. R: Patient remains safe on the unit.  Patient attended group tonight.  Patient visible on the unit and interacting with peers.  Patient taking administered medications.  Patient pleasant and cooperative on the unit.

## 2015-07-02 NOTE — Progress Notes (Signed)
[facility] Group Notes:  (Nursing/MHT/Case Management/Adjunct)  Date:  07/02/2015  Time:  9:15 PM  Type of Therapy:  Psychoeducational Skills  Participation Level:  Active  Participation Quality:  Appropriate and Attentive  Affect:  Appropriate and Excited  Cognitive:  Appropriate  Insight:  Appropriate and Good  Engagement in Group:  Engaged  Modes of Intervention:  Activity  Summary of Progress/Problems: Pts played a therapeutic activity of [employer]. Pt was engaged and actively participated.  Tajana Crotteau C 07/02/2015, 9:15 PM 

## 2015-07-02 NOTE — Progress Notes (Signed)
Patient ID: Phillip Kemp, male   DOB: Sep 20, 1961, 54 y.o.   MRN: [id number] [facility] MD Progress Note  07/02/2015 12:10 PM Phillip Kemp  MRN:  [id number]   Subjective:  In general, he reports he is feeling better. He is happy to notice reduction of thyroid size, and feels his eyes are also improved ( referring to improving exophthalmos ). His appetite is better, energy level is also better. At this time his major concern refers to disposition options and ensuring he has a " place to go " ( patient is homeless). Denies medication side effects.   Objective:  Patient seen, case discussed with Treatment team.  Patient clearly improved compared to his initial presentation- as he reports, decreased thyroid gland size is apparent.He is less depressed, with improved range of affect, and is future oriented at present. He is concerned about housing/disposition issues, as he has been homeless and justifiably concerned about severity of illness and how being homeless would " make things worse". As discussed with staff, appointments are being made for PCP and with Endrocrinologist, and CSW is looking into [employer] - shelter . Patient denies any SI. He is more visible in day room and has been going to groups more . No disruptive behaviors on unit .  Principal Problem: Major depression, recurrent, chronic Diagnosis:   Patient Active Problem List   Diagnosis Date Noted  . Major depression, recurrent, chronic [F33.9] 06/22/2015  . Depressed [F32.9]   . Alcohol abuse [F10.10] 03/20/2015  . Severe recurrent major depression without psychotic features [F33.2] 03/20/2015  . Suicidal ideations [R45.851] 03/20/2015  . Hyperthyroidism [E05.90] 11/25/2014  . Alcohol use disorder, severe, dependence [F10.20] 11/22/2014  . Thyroid disease [E07.9] 11/22/2014  . Graves' disease [E05.00] 11/22/2014  . Bipolar 1 disorder, depressed, severe [F31.4] 11/21/2014  . Aspiration pneumonia [J69.0] 07/15/2013  .  Hypokalemia [E87.6] 07/15/2013  . Elevated LFTs [R79.89] 07/15/2013  . Microcytic anemia [D50.9] 07/15/2013  . Thrombocytopenia [D69.6] 07/15/2013  . 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 Adrenal nodule, left [E27.9] 07/15/2013  . Chest pain, midsternal [R07.2] 09/24/2012  . Depression [F32.9] 09/11/2012  . Papules [L98.9] 09/10/2012  . Suicidal ideation [R45.851] 09/09/2012  . Chronic blood loss anemia [D50.0] 09/09/2012  . Acute alcoholic pancreatitis [K85.2] 21/30/8657  . Rectal bleeding [K62.5] 09/08/2012  . Cocaine abuse [F14.10] 09/08/2012  . Anemia [D64.9] 09/08/2012  . Left adrenal mass [E27.9] 09/08/2012  . Pseudogout of knee [M11.869] 08/27/2011  . Substance abuse [F19.10] 08/27/2011  . Chronic pain [G89.29] 08/27/2011   Total Time spent with patient: 20 minutes  Past Medical History:  Past Medical History  Diagnosis Date  . Arthritis   . Substance abuse     Cocaine, marijuana, and alcohol  . Suicidal ideation 09/09/2012  . Chronic blood loss anemia 09/09/2012  . Rectal bleeding 09/08/2012  . Acute alcoholic pancreatitis 09/08/2012  . Depression   . Hyperthyroidism     Past Surgical History  Procedure Laterality Date  . Orthopedic surgery      Left foot surgery  . Left foot surgery    . Left cataract extraction     Family History:  Family History  Problem Relation Age of Onset  . Heart disease Mother   . Cancer Father     prostate   Social History:  History  Alcohol Use  . 28.8 oz/week  . 48 Cans of beer per week     History  Drug Use  . Yes  . Special: Marijuana, Cocaine  History   Social History  . Marital Status: Single    Spouse Name: N/A  . Number of Children: 2  . Years of Education: 12   Occupational History  . Quarry managerHouse manager.    Social History Main Topics  . Smoking status: Former Smoker -- 0.25 packs/day for 10 years    Types: Cigarettes  . Smokeless tobacco: Former NeurosurgeonUser    Quit date: 09/16/2012  . Alcohol Use: 28.8 oz/week     48 Cans of beer per week  . Drug Use: Yes    Special: Marijuana, Cocaine  . Sexual Activity: No   Other Topics Concern  . None   Social History Narrative   Widowed.  Wife died of leukemia.  2 children.  [place]   Additional History:    Sleep:  Fair   Appetite:   Improved   Assessment:   Musculoskeletal: Strength & Muscle Tone: within normal limits Gait & Station: normal Patient leans: N/A  Psychiatric Specialty Exam: Physical Exam  Constitutional: He is oriented to person, place, and time.  Neck: Normal range of motion.  Respiratory: Effort normal and breath sounds normal.  Musculoskeletal: Normal range of motion.  Neurological: He is alert and oriented to person, place, and time.    Review of Systems  Constitutional: Positive for weight loss.  Psychiatric/Behavioral: Positive for depression and suicidal ideas. Negative for hallucinations, memory loss and substance abuse. The patient is nervous/anxious and has insomnia.    Constitutional: Positive for weight loss and malaise/fatigue.  HENT:   Mass on anterior aspect of neck- likely thyroid enlargement  Eyes: Negative.  Respiratory: Negative.   Shortness of breath upon exertion  Cardiovascular: Negative.  Gastrointestinal: Positive for nausea and blood in stool.   No rectal bleeding or melenas at this time  Genitourinary: Negative.  Musculoskeletal: Negative.  Skin: Negative.  Neurological: Positive for headaches. Negative for seizures.  Endo/Heme/Allergies: Negative.  Psychiatric/Behavioral: Positive for depression, suicidal ideas and substance abuse.   ( sober x 6 months )  all other systems negative   Blood pressure 116/58, pulse 93, temperature 97.9 F (36.6 C), temperature source Oral, resp. rate 18, height 6\' 2"  (1.88 m), weight 112 lb (50.803 kg), SpO2 100 %.Body mass index is 14.37 kg/(m^2).  General Appearance: grooming is improving.  Eye Contact::   Good  Speech:  Clear and Coherent and Normal Rate  Volume:  Normal  Mood:   Improving   Affect: improved, less constricted   Thought Process:  Denies hallucinations, delusions, and paranoia  Orientation:  Full (Time, Place, and Person)  Thought Content:  Rumination about physical health,  No hallucinations, no delusions   Suicidal Thoughts:  Denies at this time  Homicidal Thoughts:  Denies at this time  Memory:  Immediate;   Good Recent;   Good Remote;   Good  Judgement:  Other:  improved   Insight:  Fair and improved  Psychomotor Activity:   Improving, participating in group sessions  Concentration:  Fair  Recall:  Good  Fund of Knowledge:Good  Language: Good  Akathisia:  Negative  Handed:  Right  AIMS (if indicated):     Assets:  Communication Skills Desire for Improvement  ADL's:  Intact  Cognition: WNL  Sleep:  Number of Hours: 5.5    Current Medications: Current Facility-Administered Medications  Medication Dose Route Frequency Provider Last Rate Last Dose  . acetaminophen (TYLENOL) tablet 650 mg  650 mg Oral Q6H PRN Worthy FlankIjeoma E Nwaeze, NP  650 mg at 07/02/15 0824  . allopurinol (ZYLOPRIM) tablet 50 mg  50 mg Oral Daily Sanjuana Kava, NP   50 mg at 07/02/15 1610  . alum & mag hydroxide-simeth (MAALOX/MYLANTA) 200-200-20 MG/5ML suspension 30 mL  30 mL Oral Q4H PRN Worthy Flank, NP   30 mL at 06/30/15 1839  . atenolol (TENORMIN) tablet 25 mg  25 mg Oral Daily Worthy Flank, NP   25 mg at 07/02/15 0823  . feeding supplement (ENSURE ENLIVE) (ENSURE ENLIVE) liquid 237 mL  237 mL Oral TID BM Kerry Hough, PA-C   237 mL at 07/02/15 1024  . hydrOXYzine (ATARAX/VISTARIL) tablet 25 mg  25 mg Oral Q6H PRN Worthy Flank, NP   25 mg at 07/02/15 0210  . ibuprofen (ADVIL,MOTRIN) tablet 600 mg  600 mg Oral Q6H PRN Kerry Hough, PA-C      . magnesium hydroxide (MILK OF MAGNESIA) suspension 30 mL  30 mL Oral Daily PRN Worthy Flank, NP   30 mL at 06/28/15 1956  . methimazole  (TAPAZOLE) tablet 5 mg  5 mg Oral BID Worthy Flank, NP   5 mg at 07/02/15 0823  . mirtazapine (REMERON) tablet 30 mg  30 mg Oral QHS Craige Cotta, MD   30 mg at 06/28/15 2221  . multivitamin with minerals tablet 1 tablet  1 tablet Oral Daily Craige Cotta, MD   1 tablet at 07/02/15 [phone]  . traZODone (DESYREL) tablet 100 mg  100 mg Oral QHS PRN Thermon Leyland, NP   100 mg at 07/01/15 2158    Lab Results:  No results found for this or any previous visit (from the past 48 hour(s)).  Physical Findings: AIMS: Facial and Oral Movements Muscles of Facial Expression: None, normal Lips and Perioral Area: None, normal Jaw: None, normal Tongue: None, normal,Extremity Movements Upper (arms, wrists, hands, fingers): None, normal Lower (legs, knees, ankles, toes): None, normal, Trunk Movements Neck, shoulders, hips: None, normal, Overall Severity Severity of abnormal movements (highest score from questions above): None, normal Incapacitation due to abnormal movements: None, normal Patient's awareness of abnormal movements (rate only patient's report): No Awareness, Dental Status Current problems with teeth and/or dentures?: Yes Does patient usually wear dentures?: No  CIWA:  CIWA-Ar Total: 1 COWS:  COWS Total Score: 1   Assessment-  Ongoing improvement . Mood is clearly better, and he is less depressed and less anxious. Psychiatric improvement likely correlates to medical improvement as his Grave's Disease/ hyperthyroidism is better controlled on current Tapazole management.  In the context of hyperthyroidism had lost significant weight , which he is now gradually regaining. At present disposition options are being followed up on/ finalized, in anticipation for discharge soon.  Treatment Plan Summary: Daily contact with patient to assess and evaluate symptoms and progress in treatment and Medication management Continue crisis management and stabilization             Continue  Remeron  30  mgs QHS  for depression and Insomnia              Continue  Atenolol 25 mg daily and Tapazole 5 mg BID- to address hyperthyroidism .              Continue  Ensure Supplement to improve nutritional status .             Continue Trazodone  100 mgrs QHS PRN Insomnia .  Ongoing disposition planning- CSW to confirm that [facility] patient- patient to follow up with PCP and Endocrinologist for ongoing management of hyperthyriodism.   Medical Decision Making:  Established Problem, Stable/Improving (1), Review of Psycho-Social Stressors (1), Review or order clinical lab tests (1), Review of Medication Regimen & Side Effects (2) and Review of New Medication or Change in Dosage (2)  Nehemiah Massed,  MD  07/02/2015, 12:10 PM

## 2015-07-02 NOTE — Progress Notes (Signed)
D:  Per pt self inventory pt reports sleeping fair, appetite fair, energy level low, ability to pay attention good, rates depression at a 3 out of 10, hopelessness at a 3 out of 10, anxiety at a 3 out of 10, brightens during interaction, denies SI/HI/AVH, goal today: "discharge, talk with the doctor today"     A:  Emotional support provided, Encouraged pt to continue with treatment plan and attend all group activities, q15 min checks maintained for safety.  R:  Pt is receptive, going to groups, interactive in the milieu, med compliant, pleasant and cooperative with staff and other patients on the unit.

## 2015-07-03 DIAGNOSIS — F332 Major depressive disorder, recurrent severe without psychotic features: Secondary | ICD-10-CM | POA: Insufficient documentation

## 2015-07-03 MED ORDER — METHIMAZOLE 5 MG PO TABS: 5.0000 mg | ORAL_TABLET | Freq: Two times a day (BID) | ORAL | Status: DC

## 2015-07-03 MED ORDER — ATENOLOL 25 MG PO TABS: 25.0000 mg | ORAL_TABLET | Freq: Every day | ORAL | Status: DC

## 2015-07-03 MED ORDER — TRAZODONE HCL 100 MG PO TABS: 100.0000 mg | ORAL_TABLET | Freq: Every evening | ORAL | Status: DC | PRN

## 2015-07-03 MED ORDER — MIRTAZAPINE 30 MG PO TABS: 30.0000 mg | ORAL_TABLET | Freq: Every day | ORAL | Status: DC

## 2015-07-03 MED ORDER — HYDROXYZINE HCL 25 MG PO TABS: 25.0000 mg | ORAL_TABLET | Freq: Four times a day (QID) | ORAL | Status: DC | PRN

## 2015-07-03 NOTE — Discharge Summary (Signed)
Physician Discharge Summary Note  Patient:  Phillip Kemp is an 53 y.o., male MRN:  [id number] DOB:  Jul 23, 1961 Patient phone:  [phone] (home)  Patient address:   Homeless,  Total Time spent with patient: Greater than 30 minutes  Date of Admission:  06/22/2015  Date of Discharge: 07-03-15  Reason for Admission: Mood stabilization treatment  Principal Problem: Major depression, recurrent, chronic Discharge Diagnoses: Patient Active Problem List   Diagnosis Date Noted  . Major depression, recurrent, chronic [F33.9] 06/22/2015  . Depressed [F32.9]   . Alcohol abuse [F10.10] 03/20/2015  . Severe recurrent major depression without psychotic features [F33.2] 03/20/2015  . Suicidal ideations [R45.851] 03/20/2015  . Hyperthyroidism [E05.90] 11/25/2014  . Alcohol use disorder, severe, dependence [F10.20] 11/22/2014  . Thyroid disease [E07.9] 11/22/2014  . Graves' disease [E05.00] 11/22/2014  . Bipolar 1 disorder, depressed, severe [F31.4] 11/21/2014  . Aspiration pneumonia [J69.0] 07/15/2013  . Hypokalemia [E87.6] 07/15/2013  . Elevated LFTs [R79.89] 07/15/2013  . Microcytic anemia [D50.9] 07/15/2013  . Thrombocytopenia [D69.6] 07/15/2013  . 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 Adrenal nodule, left [E27.9] 07/15/2013  . Chest pain, midsternal [R07.2] 09/24/2012  . Depression [F32.9] 09/11/2012  . Papules [L98.9] 09/10/2012  . Suicidal ideation [R45.851] 09/09/2012  . Chronic blood loss anemia [D50.0] 09/09/2012  . Acute alcoholic pancreatitis [K85.2] 82/95/6213  . Rectal bleeding [K62.5] 09/08/2012  . Cocaine abuse [F14.10] 09/08/2012  . Anemia [D64.9] 09/08/2012  . Left adrenal mass [E27.9] 09/08/2012  . Pseudogout of knee [M11.869] 08/27/2011  . Substance abuse [F19.10] 08/27/2011  . Chronic pain [G89.29] 08/27/2011   Musculoskeletal: Strength & Muscle Tone: within normal limits Gait & Station: normal Patient leans: N/A  Psychiatric Specialty Exam: Physical Exam   Psychiatric: His speech is normal and behavior is normal. Thought content normal. His mood appears not anxious. His affect is not angry, not blunt, not labile and not inappropriate. Cognition and memory are normal. He does not exhibit a depressed mood.    Review of Systems  Constitutional: Negative.   HENT: Negative.   Eyes: Negative.   Respiratory: Negative.   Cardiovascular: Negative.   Gastrointestinal: Negative.   Genitourinary: Negative.   Musculoskeletal: Negative.   Skin: Negative.   Neurological: Negative.   Endo/Heme/Allergies: Negative.   Psychiatric/Behavioral: Positive for depression [facility]). Negative for suicidal ideas, hallucinations, memory loss and substance abuse. The patient has insomnia (Stable). The patient is not nervous/anxious.     Blood pressure 126/54, pulse 98, temperature 97.6 F (36.4 C), temperature source Oral, resp. rate 18, height 6\' 2"  (1.88 m), weight 50.803 kg (112 lb), SpO2 100 %.Body mass index is 14.37 kg/(m^2).  See Md's SRA   Have you used any form of tobacco in the last 30 days? (Cigarettes, Smokeless Tobacco, Cigars, and/or Pipes): No  Has this patient used any form of tobacco in the last 30 days? (Cigarettes, Smokeless Tobacco, Cigars, and/or Pipes) No  Past Medical History:  Past Medical History  Diagnosis Date  . Arthritis   . Substance abuse     Cocaine, marijuana, and alcohol  . Suicidal ideation 09/09/2012  . Chronic blood loss anemia 09/09/2012  . Rectal bleeding 09/08/2012  . Acute alcoholic pancreatitis 09/08/2012  . Depression   . Hyperthyroidism     Past Surgical History  Procedure Laterality Date  . Orthopedic surgery      Left foot surgery  . Left foot surgery    . Left cataract extraction     Family History:  Family History  Problem Relation  Age of Onset  . Heart disease Mother   . Cancer Father     prostate   Social History:  History  Alcohol Use  . 28.8 oz/week  . 48 Cans of beer per week     History   Drug Use  . Yes  . Special: Marijuana, Cocaine    History   Social History  . Marital Status: Single    Spouse Name: N/A  . Number of Children: 2  . Years of Education: 12   Occupational History  . Quarry manager.    Social History Main Topics  . Smoking status: Former Smoker -- 0.25 packs/day for 10 years    Types: Cigarettes  . Smokeless tobacco: Former Neurosurgeon    Quit date: 09/16/2012  . Alcohol Use: 28.8 oz/week    48 Cans of beer per week  . Drug Use: Yes    Special: Marijuana, Cocaine  . Sexual Activity: No   Other Topics Concern  . None   Social History Narrative   Widowed.  Wife died of leukemia.  2 children.  [facility].   Risk to Self: Is patient at risk for suicide?: No Risk to Others: No Prior Inpatient Therapy: Yes Prior Outpatient Therapy: Yes  Level of Care:  OP  Hospital Course:  54 Year old single male, originally from [place], but living in this area for several years. Recently was kicked out by his roommate, and so became homeless. States " he had never told landlord I was living there and landlord told him I had leave". Patient also very upset because this person kept his clothes and some money of his. He then developed some homicidal ideations " I wanted to cut him", but states he has no history of violence and is not a violent person at heart so decided to walk away instead ( these events occurred yesterday).Another major stressor is that he has been physically ill for 2 years. He was diagnosed with Graves' disease and has been on Tapazole for 2 years, but he states " I have not been able to get any weight back", " I look like I am physically sick all the time". Of note, he ran out of Tapazole 2 months ago. He states he has been depressed for several months, and feels it is getting worse. Has also been thinking of " giving up and killing myself", and yesterday had impulsively overdosed on about 10 Risperidones and 2 Trazodones. States " all  it did was make my stomach hurt". At this time denies plan or intention of hurting self. He has a history of alcohol and cocaine abuse, but has been sober x 6 months .  Upon his arrival & admision to the adult unit, Prinston was evaluated & his presenting symptoms identified. The medication management for the presenting symptoms were discussed & initiated targeting those symptoms. He was enrolled in the group counseling sessions & encouraged to participate in the unit programming. His other pre-existing medical problems were identified & his home medications restarted accordingly. Staley was evaluated on daily basis by the clinical providers to assure his response to the treatment regimen.As his treatment progressed,  improvement was noted as evidenced by his report of decreasing symptoms, improved sleep, mood, affect, medication tolerance & active participation in the unit programming.He was encouraged to update his providers on his progress by daily completion of a self inventory, noting mood, mental status, pain, any new symptoms, anxiety and or concerns. This he did  on daily basis & presents as being hopeful for the future as he found himself gaining some of his weight back eventually.  Layson's symptoms responded well to his treatment regimen combined with a therapeutic and supportive environment. He was motivated for recovery as evidenced by a positive/appropriate behavior and his interaction with the staff & fellow patients.He also worked closely with the treatment team and case manager to develop a discharge plan with appropriate goals to maintain mood stability after discharge. Coping skills, problem solving as well as relaxation therapies were also part of his unit programming.  Upon his hospital discharge, Rickey was in much improved condition than upon admission.His symptoms were reported as significantly decreased or resolved completely. He adamantly denies any SI/HI,  AVH, delusional  thoughts & or paranoia. He was motivated to continue taking medication with a goal of continued improvement in mental health. He will continue psychiatric care on an outpatient basis as noted below. He is provided with all the necessary information required to make these appointments without problems. He received a 14 days worth, supply samples of his [facility] discharge medications. He left [facility] with all personal belongings in no apparent distress. Transportation per city bus. [facility] assisted with bus pass.  Consults:  psychiatry  Significant Diagnostic Studies:  labs: CBC with diff, CMP, UDS, toxicology tests, U/A, results reviewed, stable  Discharge Vitals:   Blood pressure 126/54, pulse 98, temperature 97.6 F (36.4 C), temperature source Oral, resp. rate 18, height 6\' 2"  (1.88 m), weight 50.803 kg (112 lb), SpO2 100 %. Body mass index is 14.37 kg/(m^2).  Lab Results:   No results found for this or any previous visit (from the past 72 hour(s)).  Physical Findings: AIMS: Facial and Oral Movements Muscles of Facial Expression: None, normal Lips and Perioral Area: None, normal Jaw: None, normal Tongue: None, normal,Extremity Movements Upper (arms, wrists, hands, fingers): None, normal Lower (legs, knees, ankles, toes): None, normal, Trunk Movements Neck, shoulders, hips: None, normal, Overall Severity Severity of abnormal movements (highest score from questions above): None, normal Incapacitation due to abnormal movements: None, normal Patient's awareness of abnormal movements (rate only patient's report): No Awareness, Dental Status Current problems with teeth and/or dentures?: Yes Does patient usually wear dentures?: No  CIWA:  CIWA-Ar Total: 1 COWS:  COWS Total Score: 1  See Psychiatric Specialty Exam and Suicide Risk Assessment completed by Attending Physician prior to discharge.  Discharge destination:  Home  Is patient on multiple antipsychotic therapies at discharge:  No   Has  Patient had three or more failed trials of antipsychotic monotherapy by history:  No  Recommended Plan for Multiple Antipsychotic Therapies: NA     Discharge Instructions    Discharge instructions    Complete by:  As directed   [facility] at the [facility]            Medication List    STOP taking these medications        diphenhydrAMINE 25 MG tablet  Commonly known as:  BENADRYL     escitalopram 20 MG tablet  Commonly known as:  LEXAPRO     famotidine 20 MG tablet  Commonly known as:  PEPCID     HYDROcodone-acetaminophen 5-325 MG per tablet  Commonly known as:  NORCO/VICODIN     permethrin 5 % cream  Commonly known as:  ELIMITE     predniSONE 20 MG tablet  Commonly known as:  DELTASONE     risperiDONE 1 MG tablet  Commonly known as:  RISPERDAL      TAKE these medications      Indication   atenolol 25 MG tablet  Commonly known as:  TENORMIN  Take 1 tablet (25 mg total) by mouth daily. For high blood pressure   Indication:  High Blood Pressure     hydrOXYzine 25 MG tablet  Commonly known as:  ATARAX/VISTARIL  Take 1 tablet (25 mg total) by mouth every 6 (six) hours as needed for anxiety (sleep).   Indication:  Anxiety,  insomnia     methimazole 5 MG tablet  Commonly known as:  TAPAZOLE  Take 1 tablet (5 mg total) by mouth 2 (two) times daily. For hyperthyroidism   Indication:  Overactive Thyroid Gland     mirtazapine 30 MG tablet  Commonly known as:  REMERON  Take 1 tablet (30 mg total) by mouth at bedtime. For depression/insomnia   Indication:  Trouble Sleeping, Major Depressive Disorder     traZODone 100 MG tablet  Commonly known as:  DESYREL  Take 1 tablet (100 mg total) by mouth at bedtime as needed for sleep.   Indication:  Trouble Sleeping       Follow-up Information    Follow up with [facility] On 07/17/2015.   Why:  New patient appointment with Jeanine LuzGregory Calone, NP on Tuesday August 2nd at 1pm. Please bring  your insurance card and call office if you need to reschedule.   Contact information:   [address]  [place], [place] Ph [phone]      Follow up with [facility]    . Schedule an appointment as soon as possible for a visit in 1 week.   Contact information:   [address] [place] [phone]     Follow-up recommendations:  Activity:  As tolerated Diet: As recommended by your primary care doctor. Keep all scheduled follow-up appointments as recommended.  Comments: Take all your medications as prescribed by your mental healthcare provider. Report any adverse effects and or reactions from your medicines to your outpatient provider promptly. Patient is instructed and cautioned to not engage in alcohol and or illegal drug use while on prescription medicines. In the event of worsening symptoms, patient is instructed to call the crisis hotline, 911 and or go to the nearest ED for appropriate evaluation and treatment of symptoms. Follow-up with your primary care provider for your other medical issues, concerns and or health care needs.   Total Discharge Time: Greater than 30 minutes  Signed: Sanjuana Kavawoko, Agnes I, PMHNP-BC 07/03/2015, 10:51 AM   Patient seen, Suicide Assessment Completed.  Disposition Plan Reviewed

## 2015-07-03 NOTE — [facility] Suicide Risk Assessment (Signed)
[facility]   Demographic Factors:  54 year old male, on disability, homeless .  Total Time spent with patient: 30 minutes  Musculoskeletal: Strength & Muscle Tone: within normal limits Gait & Station: normal Patient leans: N/A  Psychiatric Specialty Exam: Physical Exam  ROS  Blood pressure 126/54, pulse 98, temperature 97.6 F (36.4 C), temperature source Oral, resp. rate 18, height 6\' 2"  (1.88 m), weight 112 lb (50.803 kg), SpO2 100 %.Body mass index is 14.37 kg/(m^2).  General Appearance: improved grooming  Eye Contact::  Good  Speech:  Normal Rate409  Volume:  Normal  Mood:  Euthymic  Affect:  Appropriate and Full Range  Thought Process:  Goal Directed and Linear  Orientation:  Full (Time, Place, and Person)  Thought Content:  no hallucinations , no delusions  Suicidal Thoughts:  No  Homicidal Thoughts:  No  Memory:  recent and remote grossly intact   Judgement:  Other:  improved  Insight:  Present  Psychomotor Activity:  Normal  Concentration:  Good  Recall:  Good  Fund of Knowledge:Good  Language: Good  Akathisia:  Negative  Handed:  Right  AIMS (if indicated):     Assets:  Communication Skills Desire for Improvement Resilience  Sleep:  Number of Hours: 6  Cognition: WNL  ADL's:  improved   Have you used any form of tobacco in the last 30 days? (Cigarettes, Smokeless Tobacco, Cigars, and/or Pipes): No  Has this patient used any form of tobacco in the last 30 days? (Cigarettes, Smokeless Tobacco, Cigars, and/or Pipes) No  Mental Status Per Nursing Assessment::   On Admission:  NA  Current Mental Status by Physician: At this time patient is much improved compared to his admission status - mood is improved and presents euthymic at this time with a full range of affect, no thought disorder, no SI or HI,. Future oriented . No psychotic symptoms. Patient insightful about his hyperthyroidism /Grave's Disease and the importance of ongoing  medication management and medical follow up for this condition.  Loss Factors: Homelessness, disability, limited social support system, chronic illness   Historical Factors: History of Depression, History of Substance Abuse, history of Grave's Disease   Risk Reduction Factors:   Positive coping skills or problem solving skills  Continued Clinical Symptoms:  As noted, currently much improved compared to admission. Also, patient has been gaining weight, thyroid enlargement has subsided and vitals are now stable .  Cognitive Features That Contribute To Risk:  No gross cognitive deficits noted upon discharge. Is alert , attentive, and oriented x 3     Suicide Risk:  Mild:  Suicidal ideation of limited frequency, intensity, duration, and specificity.  There are no identifiable plans, no associated intent, mild dysphoria and related symptoms, good self-control (both objective and subjective assessment), few other risk factors, and identifiable protective factors, including available and accessible social support.  Principal Problem: Major depression, recurrent, chronic Discharge Diagnoses:  Patient Active Problem List   Diagnosis Date Noted  . Major depression, recurrent, chronic [F33.9] 06/22/2015  . Depressed [F32.9]   . Alcohol abuse [F10.10] 03/20/2015  . Severe recurrent major depression without psychotic features [F33.2] 03/20/2015  . Suicidal ideations [R45.851] 03/20/2015  . Hyperthyroidism [E05.90] 11/25/2014  . Alcohol use disorder, severe, dependence [F10.20] 11/22/2014  . Thyroid disease [E07.9] 11/22/2014  . Graves' disease [E05.00] 11/22/2014  . Bipolar 1 disorder, depressed, severe [F31.4] 11/21/2014  . Aspiration pneumonia [J69.0] 07/15/2013  . Hypokalemia [E87.6] 07/15/2013  . Elevated LFTs [  R79.89] 07/15/2013  . Microcytic anemia [D50.9] 07/15/2013  . Thrombocytopenia [D69.6] 07/15/2013  . 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 Adrenal nodule, left [E27.9] 07/15/2013   . Chest pain, midsternal [R07.2] 09/24/2012  . Depression [F32.9] 09/11/2012  . Papules [L98.9] 09/10/2012  . Suicidal ideation [R45.851] 09/09/2012  . Chronic blood loss anemia [D50.0] 09/09/2012  . Acute alcoholic pancreatitis [K85.2] 34/74/2595  . Rectal bleeding [K62.5] 09/08/2012  . Cocaine abuse [F14.10] 09/08/2012  . Anemia [D64.9] 09/08/2012  . Left adrenal mass [E27.9] 09/08/2012  . Pseudogout of knee [M11.869] 08/27/2011  . Substance abuse [F19.10] 08/27/2011  . Chronic pain [G89.29] 08/27/2011    Follow-up Information    Follow up with [facility] On 07/17/2015.   Why:  New patient appointment with Jeanine Luz, NP on Tuesday August 2nd at 1pm. Please bring your insurance card and call office if you need to reschedule.   Contact information:   [address] [place], [place] [phone]      Follow up with [facility]    . Schedule an appointment as soon as possible for a visit in 1 week.   Contact information:   [address] [place] [address] [phone]      Follow up with August Saucer ERIC, MD.   Specialty:  Internal Medicine   Contact information:   [address] [place] [address] [phone]       Follow up with [facility].   Specialty:  Behavioral Health   Why:  Please walk-in between 8am-3pm Monday-Friday to be seen by a therapist.   Contact information:   [address] [place] [address] [phone]       Plan Of Care/Follow-up recommendations:  Activity:  as tolerated Diet:  Regular Tests:  NA Other:  see below  Is patient on multiple antipsychotic therapies at discharge:  No   Has Patient had three or more failed trials of antipsychotic monotherapy by history:  No  Recommended Plan for Multiple Antipsychotic Therapies: NA Patient is leaving unit in good spirits. Plans to live in shelter until next week when he gets his monthly income , at  which time plans to get a place to live Plans to follow up as above   COBOS, FERNANDO 07/03/2015, 1:49 PM

## 2015-07-03 NOTE — Plan of Care (Signed)
Problem: Diagnosis: Increased Risk For Suicide Attempt Goal: STG-Patient Will Attend All Groups On The Unit Outcome: Progressing Patient has been attending groups on the unit.     

## 2015-07-03 NOTE — Progress Notes (Signed)
Recreation Therapy Notes  Animal-Assisted Activity (AAA) Program Checklist/Progress Notes Patient Eligibility Criteria Checklist & Daily Group note for Rec Tx Intervention  Date: 07.19.16 Time: 2:45 pm [address]   AAA/T Program Assumption of Risk Form signed by Patient/ or Parent Legal Guardian yes  Patient is free of allergies or sever asthma yes  Patient reports no fear of animals yes  Patient reports no history of cruelty to animalsyes  Patient understands his/her participation is voluntary yes  Patient washes hands before animal contact yes  Patient washes hands after animal contact yes  Education: Hand Washing, Appropriate Animal Interaction   Education Outcome: Acknowledges understanding/In group clarification offered  Clinical Observations/Feedback: Patient did not attend group.   Phillip Kemp, Phillip Kemp         Phillip RancherLindsay, Phillip Kemp 07/03/2015 4:07 PM

## 2015-07-03 NOTE — [facility] Suicide Risk Assessment (Signed)
[facility] INPATIENT:  Family/Significant Other Suicide Prevention Education  Suicide Prevention Education:  Patient Refusal for Family/Significant Other Suicide Prevention Education: The patient Phillip Kemp has refused to provide written consent for family/significant other to be provided Family/Significant Other Suicide Prevention Education during admission and/or prior to discharge.  Physician notified.  Pt did provide contact information for her daughter but requested that suicide prevention education NOT be completed with her.  Elaina Hoopsarter, Seabron Iannello M 07/03/2015, 2:53 PM

## 2015-07-03 NOTE — Progress Notes (Signed)
  [facility] Discharge [facility] :  Will you be returning to the same living situation after discharge:  No. Pt discharged to [facility] At discharge, do you have transportation home?: Yes,  Pt given bus pass and PART fare Do you have the ability to pay for your medications: Yes,  Pt provided with supply as well as prescriptions  Release of information consent forms completed and in the chart;  Patient's signature needed at discharge.  Patient to Follow up at: Follow-up Information    Follow up with [facility] On 07/20/2015.   Why:  New patient appointment with Jeanine LuzGregory Calone, NP on Tuesday August 2nd at 10am. Please bring your insurance card and call office if you need to reschedule.   Contact information:   [address]  [place], [place] Ph [phone]      Follow up with [facility]    . Schedule an appointment as soon as possible for a visit in 1 week.   Contact information:   [address] [place] [phone]      Follow up with August SaucerEAN, ERIC, MD.   Specialty:  Internal Medicine   Contact information:   [address] [place] [address] [phone]       [facility].   Specialty:  Behavioral Health   Why:  Please walk-in between 8am-3pm Monday-Friday to be seen by a therapist.   Contact information:   [address] [place] [address] [phone]       Patient denies SI/HI: Yes,  Pt denies    Safety Planning and Suicide Prevention discussed: Yes,  with sister. See SPE note for further detail  Have you used any form of tobacco in the last 30 days? (Cigarettes, Smokeless Tobacco, Cigars, and/or Pipes): No  Has patient been referred to the Quitline?: N/A patient is not a smoker  Elaina HoopsCarter, Robbye Dede M 07/03/2015, 3:12 PM

## 2015-07-03 NOTE — Progress Notes (Signed)
Pt d/c with a bus pass and cash for part bus. All items returned. D/C instructions given, samples given and prescriptions given. Pt denies si ane hi.

## 2015-07-03 NOTE — Progress Notes (Signed)
Adult Psychoeducational Group Note  Date:  07/03/2015 Time:    Group Topic/Focus:  Diagnosis Education:   The focus of this group is to discuss the major disorders that patients maybe diagnosed with.  Group discusses the importance of knowing what one's diagnosis is so that one can understand treatment and better advocate for oneself.  Participation Level:  Active  Participation Quality:  Appropriate and Supportive  Affect:  Appropriate  Cognitive:  Appropriate  Insight: Appropriate  Engagement in Group:  Developing/Improving  Modes of Intervention:  Discussion and Support  Additional Comments:  Patient will identify one goal for today.  Earline MayotteKnight, [place] Huberty Shephard 07/03/2015, 10:13 AM

## 2015-07-06 ENCOUNTER — Telehealth: Payer: Self-pay | Admitting: Family

## 2015-07-06 NOTE — Telephone Encounter (Signed)
Received records from [facility] Service forwarded to Dr. Carver Fila 46 pages 07/06/15 fbg.

## 2015-07-17 ENCOUNTER — Ambulatory Visit: Payer: Self-pay | Admitting: Family

## 2015-07-20 ENCOUNTER — Other Ambulatory Visit (INDEPENDENT_AMBULATORY_CARE_PROVIDER_SITE_OTHER): Payer: Medicare Other

## 2015-07-20 ENCOUNTER — Ambulatory Visit (INDEPENDENT_AMBULATORY_CARE_PROVIDER_SITE_OTHER): Payer: Medicare Other | Admitting: Family

## 2015-07-20 ENCOUNTER — Encounter: Payer: Self-pay | Admitting: Family

## 2015-07-20 VITALS — BP 108/72 | HR 87 | Temp 97.8°F | Resp 18 | Ht 73.75 in | Wt 135.0 lb

## 2015-07-20 DIAGNOSIS — F339 Major depressive disorder, recurrent, unspecified: Secondary | ICD-10-CM

## 2015-07-20 DIAGNOSIS — E0500 Thyrotoxicosis with diffuse goiter without thyrotoxic crisis or storm: Secondary | ICD-10-CM

## 2015-07-20 DIAGNOSIS — G479 Sleep disorder, unspecified: Secondary | ICD-10-CM | POA: Diagnosis not present

## 2015-07-20 LAB — TSH: TSH: 0.06 u[IU]/mL — AB (ref 0.35–4.50)

## 2015-07-20 MED ORDER — TRAZODONE HCL 100 MG PO TABS: 100.0000 mg | ORAL_TABLET | Freq: Every evening | ORAL | Status: DC | PRN

## 2015-07-20 MED ORDER — ENSURE PO LIQD: 237.0000 mL | Freq: Two times a day (BID) | ORAL | Status: DC

## 2015-07-20 MED ORDER — HYDROXYZINE HCL 25 MG PO TABS: 25.0000 mg | ORAL_TABLET | Freq: Four times a day (QID) | ORAL | Status: DC | PRN

## 2015-07-20 MED ORDER — MIRTAZAPINE 30 MG PO TABS: 30.0000 mg | ORAL_TABLET | Freq: Every day | ORAL | Status: DC

## 2015-07-20 MED ORDER — ONE-A-DAY MENS PO TABS: 1.0000 | ORAL_TABLET | Freq: Every day | ORAL | Status: DC

## 2015-07-20 MED ORDER — ATENOLOL 25 MG PO TABS: 25.0000 mg | ORAL_TABLET | Freq: Every day | ORAL | Status: DC

## 2015-07-20 MED ORDER — METHIMAZOLE 10 MG PO TABS: 10.0000 mg | ORAL_TABLET | Freq: Three times a day (TID) | ORAL | Status: DC

## 2015-07-20 NOTE — Progress Notes (Signed)
Pre visit review using our clinic review tool, if applicable. No additional management support is needed unless otherwise documented below in the visit note. 

## 2015-07-20 NOTE — Patient Instructions (Signed)
Thank you for choosing [facility].  Summary/Instructions:  Your prescription(s) have been submitted to your pharmacy or been printed and provided for you. Please take as directed and contact our office if you believe you are having problem(s) with the medication(s) or have any questions.  Please stop by the lab on the basement level of the building for your blood work. Your results will be released to MyChart (or called to you) after review, usually within 72 hours after test completion. If any changes need to be made, you will be notified at that same time.  If your symptoms worsen or fail to improve, please contact our office for further instruction, or in case of emergency go directly to the emergency room at the closest medical facility.    Hyperthyroidism The thyroid is a large gland located in the lower front part of your neck. The thyroid helps control metabolism. Metabolism is how your body uses food. It controls metabolism with the hormone thyroxine. When the thyroid is overactive, it produces too much hormone. When this happens, these following problems may occur:   Nervousness  Heat intolerance  Weight loss (in spite of increase food intake)  Diarrhea  Change in hair or skin texture  Palpitations (heart skipping or having extra beats)  Tachycardia (rapid heart rate)  Loss of menstruation (amenorrhea)  Shaking of the hands CAUSES  Grave's Disease (the immune system attacks the thyroid gland). This is the most common cause.  Inflammation of the thyroid gland.  Tumor (usually benign) in the thyroid gland or elsewhere.  Excessive use of thyroid medications (both prescription and 'natural').  Excessive ingestion of Iodine. DIAGNOSIS  To prove hyperthyroidism, your caregiver may do blood tests and ultrasound tests. Sometimes the signs are hidden. It may be necessary for your caregiver to watch this illness with blood tests, either before or after diagnosis and  treatment. TREATMENT Short-term treatment There are several treatments to control symptoms. Drugs called beta blockers may give some relief. Drugs that decrease hormone production will provide temporary relief in many people. These measures will usually not give permanent relief. Definitive therapy There are treatments available which can be discussed between you and your caregiver which will permanently treat the problem. These treatments range from surgery (removal of the thyroid), to the use of radioactive iodine (destroys the thyroid by radiation), to the use of antithyroid drugs (interfere with hormone synthesis). The first two treatments are permanent and usually successful. They most often require hormone replacement therapy for life. This is because it is impossible to remove or destroy the exact amount of thyroid required to make a person euthyroid (normal). HOME CARE INSTRUCTIONS  See your caregiver if the problems you are being treated for get worse. Examples of this would be the problems listed above. SEEK MEDICAL CARE IF: Your general condition worsens. MAKE SURE YOU:   Understand these instructions.  Will watch your condition.  Will get help right away if you are not doing well or get worse. Document Released: 12/01/2005 Document Revised: 02/23/2012 Document Reviewed: 04/14/2007 [facility] Patient Information 2015 [place], [place]. This information is not intended to replace advice given to you by your health care provider. Make sure you discuss any questions you have with your health care provider.

## 2015-07-20 NOTE — Assessment & Plan Note (Signed)
Currently maintained on mirtazapine. Denies thoughts of suicide. Continue current dose of mirtazapine.
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
## 2015-07-20 NOTE — Assessment & Plan Note (Signed)
Currently maintained on trazodone. Sleeping adequately with medication. Continue current dosage of trazodone.

## 2015-07-20 NOTE — Assessment & Plan Note (Signed)
Uncontrolled Graves disease as evidenced by physical exam and previous TSH <0.010. Increase methimazole. Refer to endocrinology for assistance in stabilization. Continue current dosage of atenolol to manage heart rate and blood pressure. Start Ensure to help with maintaining weight. Follow up pending endocrinology appointment or sooner if symptoms worsen.

## 2015-07-21 ENCOUNTER — Telehealth: Payer: Self-pay | Admitting: Family

## 2015-07-21 LAB — T3: T3 TOTAL: 771.69 ng/dL — AB (ref 80.0–204.0)

## 2015-07-21 LAB — T4: T4 TOTAL: 18.5 ug/dL — AB (ref 4.5–12.0)

## 2015-07-21 NOTE — Telephone Encounter (Signed)
Please inform patient that his blood work is consistent with hyperthyroidism. Please continue to take the tapizole as prescribed and follow up with endocrinology.

## 2015-07-24 ENCOUNTER — Ambulatory Visit: Payer: Self-pay | Admitting: Family Medicine

## 2015-07-24 NOTE — Telephone Encounter (Signed)
LVM on both numbers listed.

## 2015-07-26 NOTE — Telephone Encounter (Signed)
Pt aware of results 

## 2015-10-12 ENCOUNTER — Telehealth: Payer: Self-pay | Admitting: Family

## 2015-10-12 ENCOUNTER — Other Ambulatory Visit: Payer: Self-pay | Admitting: Family

## 2015-10-12 NOTE — Telephone Encounter (Signed)
In queue for [facility]. They will contact pt.

## 2015-10-12 NOTE — Telephone Encounter (Signed)
Can you please check on referral from [facility].

## 2015-10-12 NOTE — Telephone Encounter (Signed)
Last refill of medications was 8/5. Please advise

## 2015-10-12 NOTE — Telephone Encounter (Signed)
Patient is requesting script for trazodone and methimazole to be written for pickup.  Patient just moved and he is not sure of the closest [facility] near him.

## 2015-10-15 MED ORDER — METHIMAZOLE 10 MG PO TABS: 10.0000 mg | ORAL_TABLET | Freq: Three times a day (TID) | ORAL | Status: DC

## 2015-10-15 MED ORDER — TRAZODONE HCL 100 MG PO TABS: 100.0000 mg | ORAL_TABLET | Freq: Every evening | ORAL | Status: DC | PRN

## 2015-10-15 NOTE — Telephone Encounter (Signed)
Medications printed to be picked up.

## 2015-10-15 NOTE — Telephone Encounter (Signed)
Called pt to let him know that his medications were ready for pick up he states that he is going to call back with a pharmacy to send it to.

## 2015-10-17 NOTE — Telephone Encounter (Signed)
Pt scheduled to see Dr. Lucianne MussKumar.

## 2015-10-22 ENCOUNTER — Ambulatory Visit: Payer: Self-pay | Admitting: Endocrinology

## 2015-10-25 ENCOUNTER — Ambulatory Visit: Payer: Self-pay | Admitting: Endocrinology

## 2015-10-25 DIAGNOSIS — Z0289 Encounter for other administrative examinations: Secondary | ICD-10-CM
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
## 2015-11-15 ENCOUNTER — Telehealth: Payer: Self-pay | Admitting: *Deleted

## 2015-11-15 MED ORDER — METHIMAZOLE 10 MG PO TABS: 10.0000 mg | ORAL_TABLET | Freq: Three times a day (TID) | ORAL | Status: DC

## 2015-11-15 NOTE — Telephone Encounter (Signed)
Received call pt is needing refill on his methimazole. He states [facility] [address] Inform ot will send electronically...Raechel Chute/lmb

## 2015-11-28 ENCOUNTER — Telehealth: Payer: Self-pay

## 2015-11-28 NOTE — Telephone Encounter (Signed)
Left Voice Mail for pt to call back.   RE: Flu Vaccine for 2016  

## 2016-01-18 ENCOUNTER — Emergency Department (HOSPITAL_COMMUNITY)
Admission: EM | Admit: 2016-01-18 | Discharge: 2016-01-18 | Disposition: A | Discharge status: Other Acute Inpatient Hospital | Payer: Medicare Other | Attending: Emergency Medicine | Admitting: Emergency Medicine

## 2016-01-18 ENCOUNTER — Encounter (HOSPITAL_COMMUNITY): Payer: Self-pay | Admitting: Emergency Medicine

## 2016-01-18 ENCOUNTER — Emergency Department (HOSPITAL_COMMUNITY): Payer: Medicare Other

## 2016-01-18 ENCOUNTER — Inpatient Hospital Stay (HOSPITAL_COMMUNITY)
Admission: AD | Admit: 2016-01-18 | Discharge: 2016-01-25 | DRG: 885 | Disposition: A | Discharge status: Home / Self Care | Payer: 59 | Source: Intra-hospital | Attending: Psychiatry | Admitting: Psychiatry

## 2016-01-18 DIAGNOSIS — F1410 Cocaine abuse, uncomplicated: Secondary | ICD-10-CM | POA: Diagnosis not present

## 2016-01-18 DIAGNOSIS — G8929 Other chronic pain: Secondary | ICD-10-CM | POA: Diagnosis not present

## 2016-01-18 DIAGNOSIS — E010 Iodine-deficiency related diffuse (endemic) goiter: Secondary | ICD-10-CM | POA: Diagnosis present

## 2016-01-18 DIAGNOSIS — M1990 Unspecified osteoarthritis, unspecified site: Secondary | ICD-10-CM | POA: Insufficient documentation

## 2016-01-18 DIAGNOSIS — E0500 Thyrotoxicosis with diffuse goiter without thyrotoxic crisis or storm: Secondary | ICD-10-CM | POA: Diagnosis present

## 2016-01-18 DIAGNOSIS — E0590 Thyrotoxicosis, unspecified without thyrotoxic crisis or storm: Secondary | ICD-10-CM | POA: Insufficient documentation

## 2016-01-18 DIAGNOSIS — R45851 Suicidal ideations: Secondary | ICD-10-CM | POA: Insufficient documentation

## 2016-01-18 DIAGNOSIS — Y9389 Activity, other specified: Secondary | ICD-10-CM | POA: Diagnosis not present

## 2016-01-18 DIAGNOSIS — Y998 Other external cause status: Secondary | ICD-10-CM | POA: Insufficient documentation

## 2016-01-18 DIAGNOSIS — F332 Major depressive disorder, recurrent severe without psychotic features: Secondary | ICD-10-CM | POA: Diagnosis present

## 2016-01-18 DIAGNOSIS — IMO0002 Reserved for concepts with insufficient information to code with codable children: Secondary | ICD-10-CM

## 2016-01-18 DIAGNOSIS — R5081 Fever presenting with conditions classified elsewhere: Secondary | ICD-10-CM

## 2016-01-18 DIAGNOSIS — S98121A Partial traumatic amputation of right great toe, initial encounter: Secondary | ICD-10-CM | POA: Diagnosis not present

## 2016-01-18 DIAGNOSIS — S93101A Unspecified subluxation of right toe(s), initial encounter: Secondary | ICD-10-CM | POA: Insufficient documentation

## 2016-01-18 DIAGNOSIS — Y9289 Other specified places as the place of occurrence of the external cause: Secondary | ICD-10-CM | POA: Insufficient documentation

## 2016-01-18 DIAGNOSIS — Z87891 Personal history of nicotine dependence: Secondary | ICD-10-CM | POA: Diagnosis not present

## 2016-01-18 DIAGNOSIS — R0602 Shortness of breath: Secondary | ICD-10-CM

## 2016-01-18 DIAGNOSIS — F1010 Alcohol abuse, uncomplicated: Secondary | ICD-10-CM | POA: Diagnosis present

## 2016-01-18 DIAGNOSIS — X58XXXA Exposure to other specified factors, initial encounter: Secondary | ICD-10-CM | POA: Diagnosis not present

## 2016-01-18 DIAGNOSIS — F32A Depression, unspecified: Secondary | ICD-10-CM

## 2016-01-18 DIAGNOSIS — R599 Enlarged lymph nodes, unspecified: Secondary | ICD-10-CM | POA: Diagnosis not present

## 2016-01-18 DIAGNOSIS — R05 Cough: Secondary | ICD-10-CM

## 2016-01-18 DIAGNOSIS — Z79899 Other long term (current) drug therapy: Secondary | ICD-10-CM | POA: Diagnosis not present

## 2016-01-18 DIAGNOSIS — E278 Other specified disorders of adrenal gland: Secondary | ICD-10-CM | POA: Diagnosis not present

## 2016-01-18 DIAGNOSIS — F329 Major depressive disorder, single episode, unspecified: Secondary | ICD-10-CM | POA: Insufficient documentation

## 2016-01-18 DIAGNOSIS — R059 Cough, unspecified: Secondary | ICD-10-CM

## 2016-01-18 DIAGNOSIS — J189 Pneumonia, unspecified organism: Secondary | ICD-10-CM | POA: Diagnosis not present

## 2016-01-18 LAB — URINALYSIS, ROUTINE W REFLEX MICROSCOPIC
Bilirubin Urine: NEGATIVE
Glucose, UA: NEGATIVE mg/dL
Ketones, ur: NEGATIVE mg/dL
Leukocytes, UA: NEGATIVE
Nitrite: NEGATIVE
PH: 5 (ref 5.0–8.0)
Protein, ur: NEGATIVE mg/dL
SPECIFIC GRAVITY, URINE: 1.019 (ref 1.005–1.030)

## 2016-01-18 LAB — COMPREHENSIVE METABOLIC PANEL
ALBUMIN: 3 g/dL — AB (ref 3.5–5.0)
ALK PHOS: 137 U/L — AB (ref 38–126)
ALT: 20 U/L (ref 17–63)
AST: 25 U/L (ref 15–41)
Anion gap: 10 (ref 5–15)
BUN: 25 mg/dL — AB (ref 6–20)
CALCIUM: 9.6 mg/dL (ref 8.9–10.3)
CO2: 25 mmol/L (ref 22–32)
CREATININE: 0.51 mg/dL — AB (ref 0.61–1.24)
Chloride: 104 mmol/L (ref 101–111)
GFR calc Af Amer: >60 mL/min (ref >60)
GFR calc non Af Amer: >60 mL/min (ref >60)
GLUCOSE: 103 mg/dL — AB (ref 65–99)
Potassium: 4.5 mmol/L (ref 3.5–5.1)
SODIUM: 139 mmol/L (ref 135–145)
Total Bilirubin: <0.1 mg/dL — ABNORMAL LOW (ref 0.3–1.2)
Total Protein: 6.5 g/dL (ref 6.5–8.1)

## 2016-01-18 LAB — RAPID URINE DRUG SCREEN, HOSP PERFORMED
Amphetamines: NOT DETECTED
Barbiturates: NOT DETECTED
Benzodiazepines: NOT DETECTED
Cocaine: POSITIVE — AB
Opiates: NOT DETECTED
TETRAHYDROCANNABINOL: NOT DETECTED

## 2016-01-18 LAB — CBC WITH DIFFERENTIAL/PLATELET
Basophils Absolute: 0 10*3/uL (ref 0.0–0.1)
Basophils Relative: 0 %
EOS ABS: 0 10*3/uL (ref 0.0–0.7)
EOS PCT: 0 %
HCT: 28.4 % — ABNORMAL LOW (ref 39.0–52.0)
Hemoglobin: 9.2 g/dL — ABNORMAL LOW (ref 13.0–17.0)
LYMPHS ABS: 1.5 10*3/uL (ref 0.7–4.0)
LYMPHS PCT: 21 %
MCH: 25.8 pg — AB (ref 26.0–34.0)
MCHC: 32.4 g/dL (ref 30.0–36.0)
MCV: 79.8 fL (ref 78.0–100.0)
MONOS PCT: 8 %
Monocytes Absolute: 0.6 10*3/uL (ref 0.1–1.0)
Neutro Abs: 5 10*3/uL (ref 1.7–7.7)
Neutrophils Relative %: 71 %
PLATELETS: 166 10*3/uL (ref 150–400)
RBC: 3.56 MIL/uL — ABNORMAL LOW (ref 4.22–5.81)
RDW: 16.5 % — ABNORMAL HIGH (ref 11.5–15.5)
WBC: 7.1 10*3/uL (ref 4.0–10.5)

## 2016-01-18 LAB — SALICYLATE LEVEL: Salicylate Lvl: <4 mg/dL (ref 2.8–30.0)

## 2016-01-18 LAB — URINE MICROSCOPIC-ADD ON
BACTERIA UA: NONE SEEN
WBC, UA: NONE SEEN WBC/hpf (ref 0–5)

## 2016-01-18 LAB — ACETAMINOPHEN LEVEL: Acetaminophen (Tylenol), Serum: <10 ug/mL — ABNORMAL LOW (ref 10–30)

## 2016-01-18 LAB — ETHANOL: Alcohol, Ethyl (B): <5 mg/dL (ref <5)

## 2016-01-18 MED ORDER — HYDROXYZINE HCL 25 MG PO TABS
25.0000 mg | ORAL_TABLET | Freq: Four times a day (QID) | ORAL | Status: DC | PRN
  Administered 2016-01-18 – 2016-01-25 (×5): 25 mg via ORAL
  Filled 2016-01-18 (×5): qty 1

## 2016-01-18 MED ORDER — HYDROCODONE-ACETAMINOPHEN 5-325 MG PO TABS
1.0000 | ORAL_TABLET | Freq: Once | ORAL | Status: AC
  Administered 2016-01-18: 1 via ORAL
  Filled 2016-01-18: qty 1

## 2016-01-18 MED ORDER — TRAZODONE HCL 100 MG PO TABS
100.0000 mg | ORAL_TABLET | Freq: Every evening | ORAL | Status: DC | PRN
  Administered 2016-01-18: 100 mg via ORAL
  Filled 2016-01-18: qty 1

## 2016-01-18 MED ORDER — ALUM & MAG HYDROXIDE-SIMETH 200-200-20 MG/5ML PO SUSP: 30.0000 mL | ORAL | Status: DC | PRN

## 2016-01-18 MED ORDER — MAGNESIUM HYDROXIDE 400 MG/5ML PO SUSP: 30.0000 mL | Freq: Every day | ORAL | Status: DC | PRN

## 2016-01-18 MED ORDER — ACETAMINOPHEN 325 MG PO TABS: 650.0000 mg | ORAL_TABLET | Freq: Four times a day (QID) | ORAL | Status: DC | PRN

## 2016-01-18 MED ORDER — IBUPROFEN 800 MG PO TABS
800.0000 mg | ORAL_TABLET | Freq: Once | ORAL | Status: AC
  Administered 2016-01-18: 800 mg via ORAL
  Filled 2016-01-18: qty 1

## 2016-01-18 MED ORDER — METHIMAZOLE 10 MG PO TABS
10.0000 mg | ORAL_TABLET | Freq: Three times a day (TID) | ORAL | Status: DC
  Administered 2016-01-19 – 2016-01-25 (×20): 10 mg via ORAL
  Filled 2016-01-18 (×25): qty 1

## 2016-01-18 MED ORDER — ATENOLOL 25 MG PO TABS
25.0000 mg | ORAL_TABLET | Freq: Every day | ORAL | Status: DC
  Administered 2016-01-19 – 2016-01-21 (×3): 25 mg via ORAL
  Filled 2016-01-18 (×4): qty 1

## 2016-01-18 NOTE — ED Notes (Addendum)
Pt alert and oriented while watching tv from his bed.  Pt stated that he is looking forward to the dinner he ordered.  He is relaxed and communicative.

## 2016-01-18 NOTE — ED Notes (Signed)
Pt requesting pain medicine, called EDP Felicie Morn asking for order.
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
## 2016-01-18 NOTE — ED Provider Notes (Signed)
Patient report received from Acquanetta Belling, [place] at shift change.  Patient expressed depression and suicidal thoughts without a plan.  Patient has been moved to pod C awaiting completion of screening labs and TTS consult. Mild elevation in BUN, creatinine at baseline. Alk phos at baseline elevation.  Chronic, stable anemia.  Patient cleared for TTS consult with disposition pending.  Patient recommended for inpatient treatment, and has been assigned a bed at [facility].  Etta Quill, NP 01/19/16 0145  Charlesetta Shanks, MD 01/20/16 [phone]

## 2016-01-18 NOTE — ED Notes (Addendum)
Onset of 3-4 months ago left 3rd 4th and 5th toe pain. Had 1st 2nd toe amputated by lawn mower years ago.  States also here for evaluation for depression. Denies SI and HI. Relapse from using drugs one day ago used marijuana and cocaine. Last used 7 months ago.

## 2016-01-18 NOTE — ED Notes (Signed)
Pt requests assistance being connected with resources.  Pt reports recently being placed in apartment and now lacking [place].

## 2016-01-18 NOTE — ED Provider Notes (Signed)
CSN: [id number]     Arrival date & time 01/18/16  1221 History  By signing my name below, I, Marica Otter, attest that this documentation has been prepared under the direction and in the presence of Cheri Fowler, PA-C. Electronically Signed: Marica Otter, ED Scribe. 01/18/2016. 1:10 PM.   Chief Complaint  Patient presents with  . Toe Pain  . Depression  HPI PCP: Jeanine Luz, FNP HPI Comments: Phillip Kemp is a 55 y.o. 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 Associated Sx include decreased ROM of left toes secondary to pain and trouble walking secondary to pain. Pt denies fever, chills, swelling, color change. Pt also complains of worsening depression and associated SI. Pt denies HI.   Past Medical History  Diagnosis Date  . Arthritis   . Substance abuse     Cocaine, marijuana, and alcohol  . Suicidal ideation 09/09/2012  . Chronic blood loss anemia 09/09/2012  . Rectal bleeding 09/08/2012  . Acute alcoholic pancreatitis 09/08/2012  . Depression   . Hyperthyroidism   . Gout    Past Surgical History  Procedure Laterality Date  . Orthopedic surgery      Left foot surgery  . Left foot surgery    . Left cataract extraction     Family History  Problem Relation Age of Onset  . Heart disease Mother   . Arthritis Mother   . Alzheimer's disease Mother   . Cancer Father     prostate  . Arthritis Father    Social History  Substance Use Topics  . Smoking status: Former Smoker -- 0.25 packs/day for 10 years    Types: Cigarettes  . Smokeless tobacco: Never Used  . Alcohol Use: No    Review of Systems  Constitutional: Negative for fever and chills.  Musculoskeletal: Positive for arthralgias (3rd metatarsal ) and gait problem  (secondary to pain).  Psychiatric/Behavioral: Positive for suicidal ideas and dysphoric mood.  All other systems reviewed and are negative.     Allergies  Bee venom  Home Medications   Prior to Admission medications   Medication Sig Start Date End Date Taking? Authorizing Provider  atenolol (TENORMIN) 25 MG tablet Take 1 tablet (25 mg total) by mouth daily. For high blood pressure 07/20/15   Veryl Speak, FNP  ENSURE (ENSURE) Take 237 mLs by mouth 2 (two) times daily between meals. 07/20/15   Veryl Speak, FNP  hydrOXYzine (ATARAX/VISTARIL) 25 MG tablet Take 1 tablet (25 mg total) by mouth every 6 (six) hours as needed for anxiety (sleep). 07/20/15   Veryl Speak, FNP  methimazole (TAPAZOLE) 10 MG tablet Take 1 tablet (10 mg total) by mouth 3 (three) times daily. 11/15/15   Veryl Speak, FNP  mirtazapine (REMERON) 30 MG tablet Take 1 tablet (30 mg total) by mouth at bedtime. For depression/insomnia 07/20/15   Veryl Speak, FNP  multivitamin (ONE-A-DAY MEN'S) TABS tablet Take 1 tablet by mouth daily. 07/20/15   Veryl Speak, FNP  traZODone (DESYREL) 100 MG tablet Take 1 tablet (100 mg total) by mouth at bedtime as needed for sleep. 10/15/15   Veryl Speak, FNP   Triage Vitals: BP 139/62 mmHg  Pulse 106  Temp(Src) 97.9 F (36.6 C) (Oral)  Resp 19  Ht  (1.88 m)  Wt 120 lb (54.432 kg)  BMI 15.40 kg/m2  SpO2 98% Physical Exam  Constitutional: He is oriented to person, place, and time. He appears well-developed and well-nourished.  Patient is cooperative, tearful and hunched up in wheel chair responding to questions softly into the floor.  HENT:  Head: Normocephalic and atraumatic.  Right Ear: External ear normal.  Left Ear: External ear normal.  Eyes: Conjunctivae are normal. No scleral icterus.  Neck: No tracheal deviation present.  Cardiovascular: Intact distal pulses.   Pulses:      Dorsalis pedis pulses are 2+ on the right side, and 2+ on the left side.   Pulmonary/Chest: Effort normal. No respiratory distress.  Abdominal: He exhibits no distension.  Musculoskeletal: He exhibits tenderness.       Left foot: There is decreased range of motion (secondary to pain), tenderness, bony tenderness and deformity (chronic). There is no swelling, normal capillary refill, no crepitus and no laceration.  Neurological: He is alert and oriented to person, place, and time.  Patient has difficulty wiggling toes (baseline) due to pain.  Sensation intact.  Skin: Skin is warm and dry. No rash noted. No erythema.  Missing left first and second toes.  Remaining toes are curved.  Poor foot hygiene.  3rd toenail is long and beginning to curve around the distal end of the toe.  No erythema, warmth, induration, or fluctuance.  Psychiatric: His behavior is normal. He exhibits a depressed mood. He expresses suicidal (thoughts of suicide, but no plan) ideation. He expresses no homicidal ideation. He expresses no suicidal plans and no homicidal plans.    ED Course  Procedures (including critical care time) DIAGNOSTIC STUDIES: Oxygen Saturation is 98% on ra, nl by my interpretation.    COORDINATION OF CARE: 1:03 PM: Discussed treatment plan which includes imaging, labs, and psych consult with pt at bedside; patient verbalizes understanding and agrees with treatment plan.  Labs Review Labs Reviewed  COMPREHENSIVE METABOLIC PANEL  ETHANOL  CBC WITH DIFFERENTIAL/PLATELET  URINE RAPID DRUG SCREEN, HOSP PERFORMED  SALICYLATE LEVEL  ACETAMINOPHEN LEVEL  URINALYSIS, ROUTINE W REFLEX MICROSCOPIC (NOT AT [facility])    Imaging Review Dg Foot Complete Left  01/18/2016  CLINICAL DATA:  Status post amputation of first and second metatarsal severe left third metatarsal pain starting 2 weeks ago after hit toes on a brick step EXAM: LEFT FOOT - COMPLETE 3+ VIEW COMPARISON:  None. FINDINGS: Three views of the left foot submitted. The patient is status post amputation of first and second  toe. There is partial amputation distal aspect of first and second metatarsal. No acute fracture is noted. There is mild medial subluxation distal phalanx distal interphalangeal joint third toe. Mild degenerative changes third metatarsal phalangeal joint. IMPRESSION: The patient is status post amputation of first and second toe. There is partial amputation distal aspect of first and second metatarsal. No acute fracture is noted. There is mild medial subluxation distal phalanx distal interphalangeal joint third toe. Mild degenerative changes third metatarsal phalangeal joint. Electronically Signed   By: Natasha Mead M.D.   On: 01/18/2016 13:34   I have personally reviewed and evaluated these images and lab results as part of my medical decision-making.  MDM   Final diagnoses:  Depression  Toe amputation status, right (HCC)  Subluxation of toe, right, initial encounter   Patient X-Ray negative for obvious fracture or dislocation. Xray remarkable for mild medial subluxation distal phalanx interphalangeal joint third toe.  Mild degenerative changes third metatarsal phalangeal joint. Patient will be referred to podiatry.  Patient voiced depression and thoughts of suicide.  He denies active plan and homicidal ideations.  Screening labs have been obtained and TTS will be consulted.   Patient care hand off to Felicie Morn, NP at shift change who will follow up on labs and consult TTS.  I personally performed the services described in this documentation, which was scribed in my presence. The recorded information has been reviewed and is accurate.     Cheri Fowler, PA-C 01/18/16 1609  Marily Memos, MD 01/19/16 1046

## 2016-01-18 NOTE — ED Notes (Signed)
[facility] has been called.  

## 2016-01-19 ENCOUNTER — Encounter (HOSPITAL_COMMUNITY): Payer: Self-pay | Admitting: *Deleted

## 2016-01-19 DIAGNOSIS — R45851 Suicidal ideations: Secondary | ICD-10-CM

## 2016-01-19 DIAGNOSIS — F332 Major depressive disorder, recurrent severe without psychotic features: Principal | ICD-10-CM

## 2016-01-19 LAB — TSH: TSH: 0.01 u[IU]/mL — AB (ref 0.350–4.500)

## 2016-01-19 MED ORDER — ENSURE ENLIVE PO LIQD
237.0000 mL | Freq: Three times a day (TID) | ORAL | Status: DC
  Administered 2016-01-19 – 2016-01-25 (×19): 237 mL via ORAL

## 2016-01-19 MED ORDER — ADULT MULTIVITAMIN W/MINERALS CH
1.0000 | ORAL_TABLET | Freq: Every day | ORAL | Status: DC
  Administered 2016-01-19 – 2016-01-25 (×7): 1 via ORAL
  Filled 2016-01-19 (×11): qty 1

## 2016-01-19 MED ORDER — IBUPROFEN 600 MG PO TABS
600.0000 mg | ORAL_TABLET | Freq: Four times a day (QID) | ORAL | Status: DC | PRN
  Administered 2016-01-19 – 2016-01-22 (×5): 600 mg via ORAL
  Filled 2016-01-19 (×5): qty 1

## 2016-01-19 MED ORDER — INFLUENZA VAC SPLIT QUAD 0.5 ML IM SUSY
0.5000 mL | PREFILLED_SYRINGE | INTRAMUSCULAR | Status: AC
  Administered 2016-01-20: 0.5 mL via INTRAMUSCULAR
  Filled 2016-01-19: qty 0.5

## 2016-01-19 NOTE — H&P (Signed)
Psychiatric Admission Assessment Adult  Patient Identification: Phillip Kemp MRN:  [id number] Date of Evaluation:  01/19/2016 Chief Complaint:  MDD Principal Diagnosis: MDD (major depressive disorder), recurrent severe, without psychosis ([place]) Diagnosis:   Patient Active Problem List   Diagnosis Date Noted  . MDD (major depressive disorder), recurrent severe, without psychosis ([place]) [F33.2] 01/18/2016  . Sleep disturbance [G47.9] 07/20/2015  . Major depressive disorder, recurrent, severe without psychotic features ([place]) [F33.2]   . Major depression, recurrent, chronic ([place]) [F33.9] 06/22/2015  . Alcohol abuse [F10.10] 03/20/2015  . Severe recurrent major depression without psychotic features ([place]) [F33.2] 03/20/2015  . Suicidal ideations [R45.851] 03/20/2015  . Hyperthyroidism [E05.90] 11/25/2014  . Alcohol use disorder, severe, dependence ([place]) [F10.20] 11/22/2014  . Thyroid disease [E07.9] 11/22/2014  . Graves' disease [E05.00] 11/22/2014  . Bipolar 1 disorder, depressed, severe ([place]) [F31.4] 11/21/2014  . Aspiration pneumonia ([place]) [J69.0] 07/15/2013  . Hypokalemia [E87.6] 07/15/2013  . Elevated LFTs [R79.89] 07/15/2013  . Microcytic anemia [D50.9] 07/15/2013  . Thrombocytopenia ([place]) [D69.6] 07/15/2013  . 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 Adrenal nodule, left [E27.9] 07/15/2013  . Chest pain, midsternal [R07.2] 09/24/2012  . Papules [L98.9] 09/10/2012  . Suicidal ideation [R45.851] 09/09/2012  . Chronic blood loss anemia [D50.0] 09/09/2012  . Acute alcoholic pancreatitis [K59.9] 09/08/2012  . Rectal bleeding [K62.5] 09/08/2012  . Cocaine abuse [F14.10] 09/08/2012  . Anemia [D64.9] 09/08/2012  . Left adrenal mass ([place]) [E27.9] 09/08/2012  . Pseudogout of knee [M11.869] 08/27/2011  . Substance abuse [F19.10] 08/27/2011  . Chronic pain [G89.29] 08/27/2011   History of Present Illness: Tele-assessment Phillip Kemp is an 55 y.o. male, widowed, African-American who  present unaccompanied to [facility] ED reporting symptoms of depression including suicidal ideation. Pt has a history of depression and states he has been off antidepressant medications for the past seven months because [facility] would not accept his insurance. Pt states he has been under stress lately and is feeling hopeless and overwhelmed. Pt states "I just want to give up." Pt reports symptoms including crying spells, social withdrawal, loss of interest in usual pleasures, fatigue, decreased concentration, decreased sleep, decreased appetite and feelings of hopelessness. He reports suicidal ideation with no specific plan but states he cannot contract for safety if he is discharged home from the ED. He denies any history of suicide attempts however his medical record indicates a history of suicide attempt by overdosing on Risperidone and Trazodone. He denies any current homicidal ideation or history of violence. He reports he has experienced visual hallucinations as recently as one month ago including "things flying around me" but denies any recent hallucinations. Pt has a history of substance abuse and states he relapsed on $40 worth of cocaine yesterday. Pt attributes his relapse to "being around the wrong people." He denies alcohol or other substance use. Pt's urine drug screen is positive for cocaine and blood alcohol is less than five.  On Assessment: patient is awake, alert and oriented X4 , found resting in bedroom.  Denies suicidal or homicidal ideation. Denies auditory or visual hallucination and does not appear to be responding to internal stimuli. Patient states he hasn't been sleeping well and would like to rest, which is why "I stay to my self". States he does have any furniture at home and has allowed other to take over his apartment.   Patient reports he is medication compliant and hasn't been taking his medication for the past month. States his depression 8/10. Patient states "I am dealing with  a  lot of stuff."." Reports poor appetite other wise he states he is resting well. Support, encouragement and reassurance was provided.   On assessment: Associated Signs/Symptoms: Depression Symptoms:  insomnia, difficulty concentrating, suicidal thoughts without plan, loss of energy/fatigue, (Hypo) Manic Symptoms:  Irritable Mood, Anxiety Symptoms:  Excessive Worry, Social Anxiety, Psychotic Symptoms:  Hallucinations: None PTSD Symptoms: Had a traumatic exposure:  grand daughter was killed last year Avoidance:  Foreshortened Future Total Time spent with patient: 30 minutes  Past Psychiatric History: Depression  Risk to Self: Is patient at risk for suicide?: No Risk to Others:   Prior Inpatient Therapy:   Prior Outpatient Therapy:    Alcohol Screening: 1. How often do you have a drink containing alcohol?: Never 9. Have you or someone else been injured as a result of your drinking?: No 10. Has a relative or friend or a doctor or another health worker been concerned about your drinking or suggested you cut down?: No Alcohol Use Disorder Identification Test Final Score (AUDIT): 0 Brief Intervention: AUDIT score less than 7 or less-screening does not suggest unhealthy drinking-brief intervention not indicated Substance Abuse History in the last 12 months:  Yes.   Consequences of Substance Abuse: Withdrawal Symptoms:   Headaches Nausea Previous Psychotropic Medications: YES Psychological Evaluations: YES Past Medical History:  Past Medical History  Diagnosis Date  . Arthritis   . Substance abuse     Cocaine, marijuana, and alcohol  . Suicidal ideation 09/09/2012  . Chronic blood loss anemia 09/09/2012  . Rectal bleeding 09/08/2012  . Acute alcoholic pancreatitis 05/01/6159  . Depression   . Hyperthyroidism   . Gout     Past Surgical History  Procedure Laterality Date  . Orthopedic surgery      Left foot surgery  . Left foot surgery    . Left cataract extraction     Family  History:  Family History  Problem Relation Age of Onset  . Heart disease Mother   . Arthritis Mother   . Alzheimer's disease Mother   . Cancer Father     prostate  . Arthritis Father    Family Psychiatric  History: unknown Tobacco Screening: '@FLOW' ([phone])::1)@ Social History:  History  Alcohol Use No     History  Drug Use  . Yes  . Special: Marijuana, Cocaine    Comment: Former user - no clean for 2 years    Additional Social History:                           Allergies:   Allergies  Allergen Reactions  . Bee Venom Anaphylaxis   Lab Results:  Results for orders placed or performed during the hospital encounter of 01/18/16 (from the past 48 hour(s))  Comprehensive metabolic panel     Status: Abnormal   Collection Time: 01/18/16  2:34 PM  Result Value Ref Range   Sodium 139 135 - 145 mmol/L   Potassium 4.5 3.5 - 5.1 mmol/L   Chloride 104 101 - 111 mmol/L   CO2 25 22 - 32 mmol/L   Glucose, Bld 103 (H) 65 - 99 mg/dL   BUN 25 (H) 6 - 20 mg/dL   Creatinine, Ser 0.51 (L) 0.61 - 1.24 mg/dL   Calcium 9.6 8.9 - 10.3 mg/dL   Total Protein 6.5 6.5 - 8.1 g/dL   Albumin 3.0 (L) 3.5 - 5.0 g/dL   AST 25 15 - 41 U/L   ALT 20 17 -  63 U/L   Alkaline Phosphatase 137 (H) 38 - 126 U/L   Total Bilirubin <0.1 (L) 0.3 - 1.2 mg/dL   GFR calc non Af Amer >60 >60 mL/min   GFR calc Af Amer >60 >60 mL/min    Comment: (NOTE) The eGFR has been calculated using the CKD EPI equation. This calculation has not been validated in all clinical situations. eGFR's persistently <60 mL/min signify possible Chronic Kidney Disease.    Anion gap 10 5 - 15  Ethanol     Status: None   Collection Time: 01/18/16  2:34 PM  Result Value Ref Range   Alcohol, Ethyl (B) <5 <5 mg/dL    Comment:        LOWEST DETECTABLE LIMIT FOR SERUM ALCOHOL IS 5 mg/dL FOR MEDICAL PURPOSES ONLY   CBC with Diff     Status: Abnormal   Collection Time: 01/18/16  2:34 PM  Result Value Ref Range   WBC 7.1  4.0 - 10.5 K/uL   RBC 3.56 (L) 4.22 - 5.81 MIL/uL   Hemoglobin 9.2 (L) 13.0 - 17.0 g/dL   HCT 28.4 (L) 39.0 - 52.0 %   MCV 79.8 78.0 - 100.0 fL   MCH 25.8 (L) 26.0 - 34.0 pg   MCHC 32.4 30.0 - 36.0 g/dL   RDW 16.5 (H) 11.5 - 15.5 %   Platelets 166 150 - 400 K/uL   Neutrophils Relative % 71 %   Neutro Abs 5.0 1.7 - 7.7 K/uL   Lymphocytes Relative 21 %   Lymphs Abs 1.5 0.7 - 4.0 K/uL   Monocytes Relative 8 %   Monocytes Absolute 0.6 0.1 - 1.0 K/uL   Eosinophils Relative 0 %   Eosinophils Absolute 0.0 0.0 - 0.7 K/uL   Basophils Relative 0 %   Basophils Absolute 0.0 0.0 - 0.1 K/uL  Urine rapid drug screen (hosp performed)not at [facility]     Status: Abnormal   Collection Time: 01/18/16  2:34 PM  Result Value Ref Range   Opiates NONE DETECTED NONE DETECTED   Cocaine POSITIVE (A) NONE DETECTED   Benzodiazepines NONE DETECTED NONE DETECTED   Amphetamines NONE DETECTED NONE DETECTED   Tetrahydrocannabinol NONE DETECTED NONE DETECTED   Barbiturates NONE DETECTED NONE DETECTED    Comment:        DRUG SCREEN FOR MEDICAL PURPOSES ONLY.  IF CONFIRMATION IS NEEDED FOR ANY PURPOSE, NOTIFY LAB WITHIN 5 DAYS.        LOWEST DETECTABLE LIMITS FOR URINE DRUG SCREEN Drug Class       Cutoff (ng/mL) Amphetamine      1000 Barbiturate      200 Benzodiazepine   250 Tricyclics       037 Opiates          300 Cocaine          300 THC              50   Salicylate level     Status: None   Collection Time: 01/18/16  2:34 PM  Result Value Ref Range   Salicylate Lvl <0.4 2.8 - 30.0 mg/dL  Acetaminophen level     Status: Abnormal   Collection Time: 01/18/16  2:34 PM  Result Value Ref Range   Acetaminophen (Tylenol), Serum <10 (L) 10 - 30 ug/mL    Comment:        THERAPEUTIC CONCENTRATIONS VARY SIGNIFICANTLY. A RANGE OF 10-30 ug/mL MAY BE AN EFFECTIVE CONCENTRATION FOR MANY PATIENTS. HOWEVER, SOME ARE BEST TREATED  AT CONCENTRATIONS OUTSIDE THIS RANGE. ACETAMINOPHEN CONCENTRATIONS >150 ug/mL AT 4  HOURS AFTER INGESTION AND >50 ug/mL AT 12 HOURS AFTER INGESTION ARE OFTEN ASSOCIATED WITH TOXIC REACTIONS.   Urinalysis, Routine w reflex microscopic (not at [facility])     Status: Abnormal   Collection Time: 01/18/16  2:34 PM  Result Value Ref Range   Color, Urine YELLOW YELLOW   APPearance CLEAR CLEAR   Specific Gravity, Urine 1.019 1.005 - 1.030   pH 5.0 5.0 - 8.0   Glucose, UA NEGATIVE NEGATIVE mg/dL   Hgb urine dipstick MODERATE (A) NEGATIVE   Bilirubin Urine NEGATIVE NEGATIVE   Ketones, ur NEGATIVE NEGATIVE mg/dL   Protein, ur NEGATIVE NEGATIVE mg/dL   Nitrite NEGATIVE NEGATIVE   Leukocytes, UA NEGATIVE NEGATIVE  Urine microscopic-add on     Status: Abnormal   Collection Time: 01/18/16  2:34 PM  Result Value Ref Range   Squamous Epithelial / LPF 0-5 (A) NONE SEEN   WBC, UA NONE SEEN 0 - 5 WBC/hpf   RBC / HPF 0-5 0 - 5 RBC/hpf   Bacteria, UA NONE SEEN NONE SEEN   Casts HYALINE CASTS (A) NEGATIVE    Metabolic Disorder Labs:  Lab Results  Component Value Date   HGBA1C 5.0 11/22/2014   MPG 97 11/22/2014   No results found for: PROLACTIN Lab Results  Component Value Date   CHOL 104 11/22/2014   TRIG 63 11/22/2014   HDL 61 11/22/2014   CHOLHDL 1.7 11/22/2014   VLDL 13 11/22/2014   LDLCALC 30 11/22/2014    Current Medications: Current Facility-Administered Medications  Medication Dose Route Frequency Provider Last Rate Last Dose  . alum & mag hydroxide-simeth (MAALOX/MYLANTA) 200-200-20 MG/5ML suspension 30 mL  30 mL Oral Q4H PRN Harriet Butte, NP      . atenolol (TENORMIN) tablet 25 mg  25 mg Oral Daily Harriet Butte, NP   25 mg at 01/19/16 0829  . hydrOXYzine (ATARAX/VISTARIL) tablet 25 mg  25 mg Oral Q6H PRN Harriet Butte, NP   25 mg at 01/19/16 0954  . ibuprofen (ADVIL,MOTRIN) tablet 600 mg  600 mg Oral Q6H PRN Harriet Butte, NP   600 mg at 01/19/16 0022  . [START ON 01/20/2016] Influenza vac split quadrivalent PF (FLUARIX) injection 0.5 mL  0.5 mL  Intramuscular Tomorrow-1000 Fernando A Cobos, MD      . magnesium hydroxide (MILK OF MAGNESIA) suspension 30 mL  30 mL Oral Daily PRN Harriet Butte, NP      . methimazole (TAPAZOLE) tablet 10 mg  10 mg Oral TID Harriet Butte, NP   10 mg at 01/19/16 0829  . traZODone (DESYREL) tablet 100 mg  100 mg Oral QHS PRN Harriet Butte, NP   100 mg at 01/18/16 2357   PTA Medications: Prescriptions prior to admission  Medication Sig Dispense Refill Last Dose  . atenolol (TENORMIN) 25 MG tablet Take 1 tablet (25 mg total) by mouth daily. For high blood pressure 90 tablet 0 01/17/2016 at 0800  . ENSURE (ENSURE) Take 237 mLs by mouth 2 (two) times daily between meals. 1422 mL 10 01/18/2016 at Unknown time  . hydrOXYzine (ATARAX/VISTARIL) 25 MG tablet Take 1 tablet (25 mg total) by mouth every 6 (six) hours as needed for anxiety (sleep). (Patient not taking: Reported on 01/18/2016) 60 tablet 0 Not Taking at Unknown time  . methimazole (TAPAZOLE) 10 MG tablet Take 1 tablet (10 mg total) by mouth 3 (three) times daily. [place]  tablet 3 01/17/2016 at Unknown time  . mirtazapine (REMERON) 30 MG tablet Take 1 tablet (30 mg total) by mouth at bedtime. For depression/insomnia (Patient not taking: Reported on 01/18/2016) 30 tablet 0 Not Taking at Unknown time  . multivitamin (ONE-A-DAY MEN'S) TABS tablet Take 1 tablet by mouth daily. 100 tablet 12 01/18/2016 at Unknown time  . traZODone (DESYREL) 100 MG tablet Take 1 tablet (100 mg total) by mouth at bedtime as needed for sleep. 30 tablet 0 Past Week at Unknown time    Musculoskeletal: Strength & Muscle Tone: decreased Gait & Station: unsteady  Due to Graves disease Dx Patient leans: N/A  Psychiatric Specialty Exam: Physical Exam  Nursing note and vitals reviewed. Constitutional: He is oriented to person, place, and time.  HENT:  Head: Normocephalic.  Neurological: He is alert and oriented to person, place, and time.  Skin: Skin is warm and dry.  Psychiatric: He has a  normal mood and affect.    Review of Systems  Psychiatric/Behavioral: Positive for depression and substance abuse. Negative for suicidal ideas and hallucinations. The patient is nervous/anxious and has insomnia.   All other systems reviewed and are negative.   Blood pressure 106/52, pulse 128, temperature 98 F (36.7 C), temperature source Oral, resp. rate 16, height '6\' 2"'  (1.88 m), weight 53.071 kg (117 lb).Body mass index is 15.02 kg/(m^2).  General Appearance: Fairly Groomed  Engineer, water::  Fair  Speech:  Clear and Coherent  Volume:  Normal  Mood:  Depressed and Irritable  Affect:  Depressed and Flat  Thought Process:  Logical  Orientation:  Full (Time, Place, and Person)  Thought Content:  Hallucinations: None  Suicidal Thoughts:  Yes.  without intent/plan  Homicidal Thoughts:  No  Memory:  Immediate;   Fair Recent;   Fair Remote;   Fair  Judgement:  Intact  Insight:  Fair  Psychomotor Activity:  Restlessness  Concentration:  Fair  Recall:  [employer] of Knowledge:Fair  Language: Fair  Akathisia:  No  Handed:  Right  AIMS (if indicated):     Assets:  Desire for Improvement Resilience Social Support  ADL's:  Intact  Cognition: WNL  Sleep:  Number of Hours: 5.5     Treatment Plan Summary: Daily contact with patient to assess and evaluate symptoms and progress in treatment and Medication management   Continue with Trazodone 100 mg for insomnia Encouraged hydration. Start Ensure feeding supplement, Start multivitamin Po daily. Will continue to monitor vitals ,medication compliance and treatment side effects while patient is here.  Reviewed labs Glucose 103, BUN 25, (elevated).  Creatinine 0.51 (l)  ,BAL - 0, UDS - positive for cocaine,  CSW will start working on disposition.  Patient to participate in therapeutic milieu   Observation Level/Precautions:  15 minute checks  Laboratory:  CBC Chemistry Profile UDS UA Reviewed, Ordered: TSH, T4  Psychotherapy:   Individual and group session  Medications:    Consultations:  Psychiatry   Discharge Concerns:  Safety, stabilization, and risk of access to medication and medication stabilization   Estimated LOS: 5-7 days  Other:     I certify that inpatient services furnished can reasonably be expected to improve the patient's condition.    Derrill Center, NP 2/4/201710:56 AM  Patient seen face to face for psychiatric evaluation. Chart reviewed and finding discussed with Physician extender. Agreed with disposition and treatment plan.   Berniece Andreas, MD

## 2016-01-19 NOTE — [facility] Group Notes (Signed)
[facility] LCSW Group Therapy Note  01/19/2016 10 AM  Type of Therapy and Topic:  Group Therapy: Avoiding Self-Sabotaging and Enabling Behaviors  Participation Level:  Did Not Attend although encouraged by CSW   Carney Bern, LCSW

## 2016-01-19 NOTE — Progress Notes (Signed)
Writer observed patient lying in bed resting and reports that he is fine and just wanting to get some rest. He requested a gatorade along with his scheduled ensure. He denies pain, reports passive si and verbally contracts, + for auditory and visual hallucinations. He plans to attend wrap up group tonight. Support given and he was made aware medications available if needed. Safety maintained on unit with 15 min checks.

## 2016-01-19 NOTE — Tx Team (Signed)
Initial Interdisciplinary Treatment Plan   PATIENT STRESSORS: Health problems Medication change or noncompliance Substance abuse   PATIENT STRENGTHS: Ability for insight Active sense of humor Average or above average intelligence Communication skills Motivation for treatment/growth   PROBLEM LIST: Problem List/Patient Goals Date to be addressed Date deferred Reason deferred Estimated date of resolution  Depression 01/18/16     Suicidal Ideation 01/18/16     Substance abuse relapse 01/18/16     "get back on track"                                     DISCHARGE CRITERIA:  Adequate post-discharge living arrangements Improved stabilization in mood, thinking, and/or behavior Medical problems require only outpatient monitoring Need for constant or close observation no longer present Verbal commitment to aftercare and medication compliance  PRELIMINARY DISCHARGE PLAN: Outpatient therapy Placement in alternative living arrangements  PATIENT/FAMIILY INVOLVEMENT: This treatment plan has been presented to and reviewed with the patient, DALBERT STILLINGS.  The patient and family have been given the opportunity to ask questions and make suggestions.  Juliann Pares 01/19/2016, 12:59 AM

## 2016-01-19 NOTE — Progress Notes (Signed)
Adult Psychoeducational Group Note  Date:  01/19/2016 Time:  10:17 PM  Group Topic/Focus:  Wrap-Up Group:   The focus of this group is to help patients review their daily goal of treatment and discuss progress on daily workbooks.  Participation Level:  Active  Participation Quality:  Appropriate and Attentive  Affect:  Appropriate  Cognitive:  Appropriate  Insight: Appropriate  Engagement in Group:  Engaged  Modes of Intervention:  Discussion  Additional Comments:  Pt stated his goal today was to get some rest, since he was just admitted today. Pt stated tomorrow he wants to get up and eat and start working on his depression.  Caswell Corwin 01/19/2016, 10:17 PM

## 2016-01-19 NOTE — [facility] Group Notes (Signed)
[facility] Group Notes:  (Nursing/MHT/Case Management/Adjunct)  Date:  01/19/2016  Time:  11:06 AM  Type of Therapy:  Psychoeducational Skills Coping Skills  Participation Level:  Did Not Attend  Participation Quality:  N/A  Affect:  N/A  Cognitive:  N/A  Insight:  None  Engagement in Group:  N/A  Modes of Intervention:  Discussion, Education and Exploration  Summary of Progress/Problems: Patient did not attend Group despite strong encouragement from staff.  Olen Cordial 01/19/2016, 11:06 AM
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
## 2016-01-19 NOTE — Progress Notes (Signed)
Patient ID: Phillip Kemp, male   DOB: 11/07/1961, 55 y.o.   MRN: [id number] Pt presents with depressed mood, affect blunted. [name] states he feels depressed, he states '' I have been anxious worrying about these people that are staying in my house taking advantage of me. '' he has complained of some anxiety, prn vistaril given. Pt declined to complete self inventory stating he needed to rest. A. Medications given as ordered. R. Pt is safe, will con't to monitor q 15 minutes for safety.

## 2016-01-19 NOTE — Plan of Care (Signed)
Problem: Diagnosis: Increased Risk For Suicide Attempt Goal: STG-Patient Will Attend All Groups On The Unit Outcome: Progressing Patient attended evening wrap up group and participated.     
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
## 2016-01-20 MED ORDER — TRAZODONE HCL 50 MG PO TABS: 50.0000 mg | ORAL_TABLET | Freq: Every evening | ORAL | Status: DC | PRN

## 2016-01-20 NOTE — BHH Group Notes (Signed)
BHH LCSW Group Therapy Note   01/20/2016  10 AM   Type of Therapy and Topic: Group Therapy: Feelings Around Returning Home & Establishing a Supportive Framework and Activity to Identify signs of Improvement or Decompensation   Participation Level: Did not attend although invited   Carney Bern, LCSW

## 2016-01-20 NOTE — BHH Group Notes (Signed)
[facility] Group Notes:  Healthy coping skills  Date:  01/20/2016  Time:  1300  Type of Therapy:  Nurse Education  Participation Level:  Active  Participation Quality:  Appropriate  Affect:  Appropriate  Cognitive:  Appropriate  Insight:  Appropriate  Engagement in Group:  Engaged  Modes of Intervention:  Discussion  Summary of Progress/Problems:  Nicole Cella 01/20/2016, 4:40 PM

## 2016-01-20 NOTE — Progress Notes (Signed)
[facility] MD Progress Note  01/20/2016 11:57 AM Phillip Kemp  MRN:  [id number]   Subjective:  Patient reports " I am feeling better today, I have a plan on how to get a handle on my life." Objective:Phillip Kemp is awake, alert and oriented X3 , found attending group session.  Denies suicidal or homicidal ideation. Denies auditory or visual hallucination and does not appear to be responding to internal stimuli. Patient interacts well with staff and others. Patient reports he is medication compliant. Patient reports that the trazodone may need to be decreased it causing me to sleep in later that I usually sleep. Report learning new coping skills "to be able to stand up for myself. I have a plan to put theses people out off my house, and I will attend family support service for therapy sessions. States his depression 8/10.Reports good appetite other wise and resting well. Support, encouragement and reassurance was provided.   Principal Problem: MDD (major depressive disorder), recurrent severe, without psychosis (HCC) Diagnosis:   Patient Active Problem List   Diagnosis Date Noted  . MDD (major depressive disorder), recurrent severe, without psychosis (HCC) [F33.2] 01/18/2016  . Sleep disturbance [G47.9] 07/20/2015  . Major depressive disorder, recurrent, severe without psychotic features (HCC) [F33.2]   . Major depression, recurrent, chronic (HCC) [F33.9] 06/22/2015  . Alcohol abuse [F10.10] 03/20/2015  . Severe recurrent major depression without psychotic features (HCC) [F33.2] 03/20/2015  . Suicidal ideations [R45.851] 03/20/2015  . Hyperthyroidism [E05.90] 11/25/2014  . Alcohol use disorder, severe, dependence (HCC) [F10.20] 11/22/2014  . Thyroid disease [E07.9] 11/22/2014  . Graves' disease [E05.00] 11/22/2014  . Bipolar 1 disorder, depressed, severe (HCC) [F31.4] 11/21/2014  . Aspiration pneumonia (HCC) [J69.0] 07/15/2013  . Hypokalemia [E87.6] 07/15/2013  . Elevated LFTs [R79.89]  07/15/2013  . Microcytic anemia [D50.9] 07/15/2013  . Thrombocytopenia (HCC) [D69.6] 07/15/2013  . 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 Adrenal nodule, left [E27.9] 07/15/2013  . Chest pain, midsternal [R07.2] 09/24/2012  . Papules [L98.9] 09/10/2012  . Suicidal ideation [R45.851] 09/09/2012  . Chronic blood loss anemia [D50.0] 09/09/2012  . Acute alcoholic pancreatitis [K85.2] 78/29/5621  . Rectal bleeding [K62.5] 09/08/2012  . Cocaine abuse [F14.10] 09/08/2012  . Anemia [D64.9] 09/08/2012  . Left adrenal mass (HCC) [E27.9] 09/08/2012  . Pseudogout of knee [M11.869] 08/27/2011  . Substance abuse [F19.10] 08/27/2011  . Chronic pain [G89.29] 08/27/2011   Total Time spent with patient: 45 minutes  Past Psychiatric History: SEE ABOVE  Past Medical History:  Past Medical History  Diagnosis Date  . Arthritis   . Substance abuse     Cocaine, marijuana, and alcohol  . Suicidal ideation 09/09/2012  . Chronic blood loss anemia 09/09/2012  . Rectal bleeding 09/08/2012  . Acute alcoholic pancreatitis 09/08/2012  . Depression   . Hyperthyroidism   . Gout     Past Surgical History  Procedure Laterality Date  . Orthopedic surgery      Left foot surgery  . Left foot surgery    . Left cataract extraction     Family History:  Family History  Problem Relation Age of Onset  . Heart disease Mother   . Arthritis Mother   . Alzheimer's disease Mother   . Cancer Father     prostate  . Arthritis Father    Family Psychiatric  History: SEE ABOVE Social History:  History  Alcohol Use No     History  Drug Use  . Yes  . Special: Marijuana, Cocaine  Comment: Former user - no clean for 2 years    Social History   Social History  . Marital Status: Single    Spouse Name: N/A  . Number of Children: 2  . Years of Education: 12   Occupational History  . Quarry manager.    Social History Main Topics  . Smoking status: Former Smoker -- 0.25 packs/day for 10 years    Types: Cigarettes   . Smokeless tobacco: Never Used  . Alcohol Use: No  . Drug Use: Yes    Special: Marijuana, Cocaine     Comment: Former user - no clean for 2 years  . Sexual Activity: No   Other Topics Concern  . None   Social History Narrative   Widowed.  Wife died of leukemia.  2 children.  [place].   Fun: [employer]   Denies religious beliefs effecting health care.    Additional Social History:                         Sleep: Fair  Appetite:  Fair  Current Medications: Current Facility-Administered Medications  Medication Dose Route Frequency Provider Last Rate Last Dose  . alum & mag hydroxide-simeth (MAALOX/MYLANTA) 200-200-20 MG/5ML suspension 30 mL  30 mL Oral Q4H PRN Worthy Flank, NP      . atenolol (TENORMIN) tablet 25 mg  25 mg Oral Daily Worthy Flank, NP   25 mg at 01/20/16 0801  . feeding supplement (ENSURE ENLIVE) (ENSURE ENLIVE) liquid 237 mL  237 mL Oral TID BM Oneta Rack, NP   237 mL at 01/20/16 0802  . hydrOXYzine (ATARAX/VISTARIL) tablet 25 mg  25 mg Oral Q6H PRN Worthy Flank, NP   25 mg at 01/19/16 0954  . ibuprofen (ADVIL,MOTRIN) tablet 600 mg  600 mg Oral Q6H PRN Worthy Flank, NP   600 mg at 01/20/16 1104  . Influenza vac split quadrivalent PF (FLUARIX) injection 0.5 mL  0.5 mL Intramuscular Tomorrow-1000 Fernando A Cobos, MD      . magnesium hydroxide (MILK OF MAGNESIA) suspension 30 mL  30 mL Oral Daily PRN Worthy Flank, NP      . methimazole (TAPAZOLE) tablet 10 mg  10 mg Oral TID Oneta Rack, NP   10 mg at 01/20/16 0801  . multivitamin with minerals tablet 1 tablet  1 tablet Oral Daily Oneta Rack, NP   1 tablet at 01/20/16 0801  . traZODone (DESYREL) tablet 50 mg  50 mg Oral QHS PRN Oneta Rack, NP        Lab Results:  Results for orders placed or performed during the hospital encounter of 01/18/16 (from the past 48 hour(s))  TSH     Status: Abnormal   Collection Time: 01/19/16  6:20 PM  Result Value Ref Range    TSH 0.010 (L) 0.350 - 4.500 uIU/mL    Comment: Performed at [facility]    Physical Findings: AIMS: Facial and Oral Movements Muscles of Facial Expression: None, normal Lips and Perioral Area: None, normal Jaw: None, normal Tongue: None, normal,Extremity Movements Upper (arms, wrists, hands, fingers): None, normal Lower (legs, knees, ankles, toes): None, normal, Trunk Movements Neck, shoulders, hips: None, normal, Overall Severity Severity of abnormal movements (highest score from questions above): None, normal Incapacitation due to abnormal movements: None, normal Patient's awareness of abnormal movements (rate only patient's report): No Awareness, Dental Status Current problems with teeth  and/or dentures?: No Does patient usually wear dentures?: No  CIWA:  CIWA-Ar Total: 2 COWS:     Musculoskeletal: Strength & Muscle Tone: within normal limits Gait & Station: normal Patient leans: N/A  Psychiatric Specialty Exam: Review of Systems  Psychiatric/Behavioral: Positive for depression. Negative for suicidal ideas. The patient is nervous/anxious. The patient does not have insomnia.   All other systems reviewed and are negative.   Blood pressure 105/48, pulse 111, temperature 97.2 F (36.2 C), temperature source Oral, resp. rate 20, height 6\' 2"  (1.88 m), weight 53.071 kg (117 lb).Body mass index is 15.02 kg/(m^2).  General Appearance: Casual, Pleasant calm and cooperative  Eye Contact::  Good  Speech:  Clear and Coherent  Volume:  Normal  Mood:  Depressed and Irritable  Affect:  Congruent  Thought Process:  Coherent  Orientation:  Full (Time, Place, and Person)  Thought Content:  Hallucinations: None  Suicidal Thoughts:  No  Homicidal Thoughts:  No  Memory:  Immediate;   Fair Recent;   Fair Remote;   Fair  Judgement:  Fair  Insight:  Fair  Psychomotor Activity:  Normal  Concentration:  Good  Recall:  [employer] of Knowledge:Fair  Language: Fair   Akathisia:  No  Handed:  Right  AIMS (if indicated):     Assets:  Desire for Improvement Financial Resources/Insurance Housing Resilience  ADL's:  Intact  Cognition: WNL  Sleep:  Number of Hours: 6.75    I agree with current treatment plan on 01/20/2016, Patient seen face-to-face for psychiatric evaluation follow-up, chart reviewed. Reviewed the information documented and agree with the treatment plan.  Treatment Plan Summary: Daily contact with patient to assess and evaluate symptoms and progress in treatment and Medication management  CSW will start working on disposition.  Patient to participate in therapeutic milieu Decrease Trazodone 100 mg for insomnia, to Trazodone 50 mg. Encouraged hydration. Start Ensure feeding supplement, Start multivitamin Po daily. Will continue to monitor vitals ,medication compliance and treatment side effects while patient is here.  Reviewed labs Glucose 103, BUN 25, (elevated).TSH 0.010 (L) pending T4 Creatinine 0.51 (l) BAL - 0, UDS - positive for cocaine,  CSW will start working on disposition.  Patient to participate in therapeutic milieu  Oneta Rack, NP 01/20/2016, 11:57 AM I reviewed chart and agreed with the findings and treatment Plan.  Kathryne Sharper, MD

## 2016-01-20 NOTE — Progress Notes (Signed)
Patient ID: Phillip Kemp, male   DOB: 1961-05-15, 55 y.o.   MRN: [id number]  Pt currently presents with a blunted affect and cooperative behavior. Per self inventory, pt rates depression at a 9, hopelessness 9 and anxiety 7. Pt's daily goal is "my depression" and they intend to do so by "go to class." Pt reports good sleep, a fair appetite, low energy and poor concentration. Pt reports foot pain, 9/10.   Pt provided with medications per providers orders. Pt's labs and vitals were monitored throughout the day. Pt supported emotionally and encouraged to express concerns and questions. Pt educated on medications, alternative foot pain and potential side effects of meds. Pt given Flu Vaccine, tolerated well.   Pt's safety ensured with 15 minute and environmental checks. Pt currently denies SI/HI and A/V hallucinations. Pt verbally agrees to seek staff if SI/HI or A/VH occurs and to consult with staff before acting on these thoughts. Will continue POC.

## 2016-01-21 ENCOUNTER — Ambulatory Visit (HOSPITAL_COMMUNITY)
Admission: RE | Admit: 2016-01-21 | Discharge: 2016-01-21 | Disposition: A | Discharge status: Home / Self Care | Payer: Medicare Other | Source: Ambulatory Visit | Attending: Internal Medicine | Admitting: Internal Medicine

## 2016-01-21 ENCOUNTER — Emergency Department (HOSPITAL_COMMUNITY)
Admission: EM | Admit: 2016-01-21 | Discharge: 2016-01-21 | Discharge status: Absent without leave | Payer: Medicare Other | Attending: Internal Medicine | Admitting: Internal Medicine

## 2016-01-21 DIAGNOSIS — R05 Cough: Secondary | ICD-10-CM | POA: Insufficient documentation

## 2016-01-21 DIAGNOSIS — J189 Pneumonia, unspecified organism: Secondary | ICD-10-CM | POA: Diagnosis not present

## 2016-01-21 DIAGNOSIS — R918 Other nonspecific abnormal finding of lung field: Secondary | ICD-10-CM

## 2016-01-21 LAB — T4: T4, Total: 22 ug/dL — ABNORMAL HIGH (ref 4.5–12.0)

## 2016-01-21 MED ORDER — MIRTAZAPINE 15 MG PO TABS
15.0000 mg | ORAL_TABLET | Freq: Every day | ORAL | Status: DC
  Administered 2016-01-21 – 2016-01-22 (×2): 15 mg via ORAL
  Filled 2016-01-21 (×4): qty 1

## 2016-01-21 MED ORDER — ATENOLOL 25 MG PO TABS
25.0000 mg | ORAL_TABLET | Freq: Every day | ORAL | Status: DC
  Administered 2016-01-22 – 2016-01-25 (×4): 25 mg via ORAL
  Filled 2016-01-21 (×7): qty 1

## 2016-01-21 MED ORDER — FOLIC ACID 1 MG PO TABS
1.0000 mg | ORAL_TABLET | Freq: Every day | ORAL | Status: DC
  Administered 2016-01-21 – 2016-01-25 (×5): 1 mg via ORAL
  Filled 2016-01-21 (×8): qty 1

## 2016-01-21 MED ORDER — VITAMIN B-1 100 MG PO TABS
100.0000 mg | ORAL_TABLET | Freq: Every day | ORAL | Status: DC
  Administered 2016-01-21 – 2016-01-25 (×5): 100 mg via ORAL
  Filled 2016-01-21 (×8): qty 1

## 2016-01-21 MED ORDER — FERROUS SULFATE 325 (65 FE) MG PO TABS
325.0000 mg | ORAL_TABLET | Freq: Every day | ORAL | Status: DC
  Administered 2016-01-22 – 2016-01-25 (×4): 325 mg via ORAL
  Filled 2016-01-21 (×6): qty 1

## 2016-01-21 NOTE — Consult Note (Signed)
[facility] Medical Consultation  Phillip Kemp XBJ:[id number] DOB: 12-23-1960 DOA: 01/18/2016 PCP: Jeanine Luz, FNP   Requesting physician: Dr Jama Flavors Date of consultation: 01/21/16 Reason for consultation: Hyperthyroidism/Dizziness  Assessment [facility] were asked to see Phillip Kemp by Dr Jama Flavors for hyperthyroidism. I have seen and examined Phillip Kemp at bedside and reviewed his chart. Phillip Kemp is a pleasant 55 year old male with Graves disease(?2 years ago), causing goiter/polysubstance abuse(cocaine/alcohol/marijuana), who is admitted to Behavioral Health for Major Depression with suicidal ideation and Internal medicine has been asked to see for hyperthyroidism/dizziness. Patient was diagnosed of Graves' disease 2 years ago, and has been on Methimazole with plans for thyroidectomy in April this yyear. Unfortunately, he ran out of his meds about a month ago, and he could not get replacement, hence has gone for a month without Methimazole/Atenolol, which were resumed when patient was admitted to [facility]. He has had some dizziness and hypotension, hence atenolol put on hold. His thyroid panel is consistent with hyperthyroidism, with tsh 0.01, total t4 of 22. Non adherence with medications would be a significant factor, so would recommend to continue Methimazole as you are doing, resume Atenolol at  daily with parameters(hold if BP<90 systolic or HR<55), check free T4/T3 level, cmp, magnesium, phosphorus, ekg, cxr and we will make further adjustments to his medications depending on studies and clinical progress. Ultimately Phillip Kemp needs thyroidectomy hence I have encouraged him to keep appointment for surgical intervention in April if that is the earliest this can be done. He should continue follow with Endocrinology meanwhile. Patient appears to have had mild dehydration at the time of admission, which could have caused dizziness. Question is cause of the dizziness  ?hyperactive thyroid/infection- he has cough and low grade temp, hopefully not aspirating.  Recommendations Thyromegaly/Graves' disease/Hyperthyroidism  Check Free T4/T3  Cbc/cmp/magnesium level/phosphorus  CXR/EKG  Continue Methimazole  tid  Atenolol 25 mg daily  Follow Endocrinology/Gen Surgery outpatient as planned MDD (major depressive disorder), recurrent severe, without psychosis (HCC)/Cocaine abuse/Alcohol abuse  Defer management to primary team  Added Folate/thiamine  Thank you Dr Jama Flavors for this consultation. We will followup again tomorrow. Please contact me if I can be of assistance in the meanwhile.    HPI:  Patient with occasional dizziness/chills since admission. He has been off meds for a month as he says he ran out and could not get prescription refilled. Has been under a lot of stress. He is supposed to have thyroidectomy in April per his account. He continues to have weight loss despite good appetite/diarrhea, and has occasional cough.  Review of Systems:  As in the hospital course summary above.  Past Medical History  Diagnosis Date  . Arthritis   . Substance abuse     Cocaine, marijuana, and alcohol  . Suicidal ideation 09/09/2012  . Chronic blood loss anemia 09/09/2012  . Rectal bleeding 09/08/2012  . Acute alcoholic pancreatitis 09/08/2012  . Depression   . Hyperthyroidism   . Gout    Past Surgical History  Procedure Laterality Date  . Orthopedic surgery      Left foot surgery  . Left foot surgery    . Left cataract extraction     Social History:  reports that he has quit smoking. His smoking use included Cigarettes. He has a 2.5 pack-year smoking history. He has never used smokeless tobacco. He reports that he uses illicit drugs (Marijuana and Cocaine). He reports that he does not drink alcohol.  Allergies  Allergen Reactions  .  Bee Venom Anaphylaxis   Family History  Problem Relation Age of Onset  . Heart disease Mother   . Arthritis  Mother   . Alzheimer's disease Mother   . Cancer Father     prostate  . Arthritis Father     Prior to Admission medications   Medication Sig Start Date End Date Taking? Authorizing Provider  atenolol (TENORMIN) 25 MG tablet Take 1 tablet (25 mg total) by mouth daily. For high blood pressure 07/20/15   Veryl Speak, FNP  ENSURE (ENSURE) Take 237 mLs by mouth 2 (two) times daily between meals. 07/20/15   Veryl Speak, FNP  hydrOXYzine (ATARAX/VISTARIL) 25 MG tablet Take 1 tablet (25 mg total) by mouth every 6 (six) hours as needed for anxiety (sleep). Patient not taking: Reported on 01/18/2016 07/20/15   Veryl Speak, FNP  methimazole (TAPAZOLE) 10 MG tablet Take 1 tablet (10 mg total) by mouth 3 (three) times daily. 11/15/15   Veryl Speak, FNP  mirtazapine (REMERON) 30 MG tablet Take 1 tablet (30 mg total) by mouth at bedtime. For depression/insomnia Patient not taking: Reported on 01/18/2016 07/20/15   Veryl Speak, FNP  multivitamin (ONE-A-DAY MEN'S) TABS tablet Take 1 tablet by mouth daily. 07/20/15   Veryl Speak, FNP  traZODone (DESYREL) 100 MG tablet Take 1 tablet (100 mg total) by mouth at bedtime as needed for sleep. 10/15/15   Veryl Speak, FNP   Physical Exam: Blood pressure 140/47, pulse 95, temperature 99.6 F (37.6 C), temperature source Oral, resp. rate 20, height  (1.88 m), weight 53.071 kg (117 lb). Filed Vitals:   01/21/16 1846 01/21/16 1849  BP: 130/46 140/47  Pulse: 94 95  Temp:    Resp:       General:  Comfortable at rest. Silky hair.   Eyes: Normal  ENT: Normal  Neck: No JVD. Thyromegaly.  Cardiovascular: S1-S2 normal. No murmurs. RRR.  Respiratory: Good air entry bilaterally. No rhonchi or rails.  Abdomen: Soft and nontender. Normal bowel sounds. No organomegaly.  Skin: Intact  Musculoskeletal: No pedal edema.  Psychiatric: Normal  Neurologic: Grossly intact.  Labs on Admission:  Basic Metabolic Panel:  Recent Labs Lab  01/18/16 1434  NA 139  K 4.5  CL 104  CO2 25  GLUCOSE 103*  BUN 25*  CREATININE 0.51*  CALCIUM 9.6   Liver Function Tests:  Recent Labs Lab 01/18/16 1434  AST 25  ALT 20  ALKPHOS 137*  BILITOT <0.1*  PROT 6.5  ALBUMIN 3.0*   No results for input(s): LIPASE, AMYLASE in the last 168 hours. No results for input(s): AMMONIA in the last 168 hours. CBC:  Recent Labs Lab 01/18/16 1434  WBC 7.1  NEUTROABS 5.0  HGB 9.2*  HCT 28.4*  MCV 79.8  PLT 166   Cardiac Enzymes: No results for input(s): CKTOTAL, CKMB, CKMBINDEX, TROPONINI in the last 168 hours. BNP: Invalid input(s): POCBNP CBG: No results for input(s): GLUCAP in the last 168 hours.  Radiological Exams on Admission: No results found.  EKG: Independently reviewed.   Time spent: 55 minutes  Phillip Kemp [facility] Pager [phone]  If 7PM-7AM, please contact night-coverage [email or web address] Password [facility] 01/21/2016, 7:46 PM

## 2016-01-21 NOTE — Progress Notes (Signed)
Recreation Therapy Notes  Date: 02.06.2017 Time: 9:30am Location: [address] Group Room   Group Topic: Stress Management  Goal Area(s) Addresses:  Patient will actively participate in stress management techniques presented during session.   Behavioral Response: Did not attend.   Marykay Lex Treva Huyett, LRT/CTRS        Shailey Butterbaugh L 01/21/2016 3:23 PM

## 2016-01-21 NOTE — Progress Notes (Signed)
Patient ID: Phillip Kemp, male   DOB: 01-08-1961, 54 y.o.   MRN: [id number]  Pt currently presents with an anxious affect and behavior. Per self inventory, pt rates depression, hopelessness and anxiety at a 9. Pt's daily goal is "a plan for me " and they intend to do so by "try to talk to someone." Pt reports fair sleep, a fair appetite, normal energy and poor concentration. Pt complains of lightheadedness, blurry spots and feeling "feverish" tonight.   Pt provided with medications per providers orders. Pt's labs and vitals were monitored throughout the day. Pt supported emotionally and encouraged to express concerns and questions. Pt educated on medications. Provider consulted about pt concerns, orders orthostatic vitals.   Pt's safety ensured with 15 minute and environmental checks. Pt currently endorses passive SI, increased anxiety and hearing "voices, like my voice, telling me all these things I need to do to get the people out of my apartment." Pt currently denies HI and visual hallucinations. Pt verbally agrees to seek staff if HI or VH occurs and to consult with staff before acting on any harmful thoughts. Will continue POC.

## 2016-01-21 NOTE — Progress Notes (Signed)
Patient ID: Phillip Kemp, male   DOB: 03-31-1961, 55 y.o.   MRN: [id number] [facility] MD Progress Note  01/21/2016 4:03 PM Phillip Kemp  MRN:  [id number]   Subjective:   Patient reports partial improvement compared to how he felt prior to admission. At this time denies medication side effects. He does report feeling somewhat dizzy at times, but gait steady . Denies falls .   Objective: I have discussed case with treatment team and have met with patient. Patient know to unit staff and writer from prior admission . Has history of depression, substance abuse in the past, and history of hyperthyroidism . At this time patient reporting partially improved mood , although still feels depressed. Denies any suicidal ideations . Reports that  Lack of furniture, and having friend/ family members at his apartment that moved in to " help me out for a little bit but now do not want to leave " are major stressors . Of note , admission UDS positive for cocaine- patient states he relapsed x 1 only , denies any recent pattern of abuse or dependence.  Visible on unit , going to some groups, no disruptive or agitated behaviors . Of note, patient states he had not been taking B blocker ( originally prescribed for hyperthyroidism symptoms) in several weeks , even months. He states dizziness started or worsened  when this medication was restarted on admission. He does state he has been compliant with Tapazole , and states his neck/thyroid mass has continued to decrease  in size - he states he has tentative thyroidectomy scheduled " in April- as long as they feel I am going to benefit from it ". TSH remains suppressed and T4 remains elevated   Principal Problem: MDD (major depressive disorder), recurrent severe, without psychosis ([place]) Diagnosis:   Patient Active Problem List   Diagnosis Date Noted  . MDD (major depressive disorder), recurrent severe, without psychosis ([place]) [F33.2] 01/18/2016  . Sleep disturbance  [G47.9] 07/20/2015  . Major depressive disorder, recurrent, severe without psychotic features ([place]) [F33.2]   . Major depression, recurrent, chronic ([place]) [F33.9] 06/22/2015  . Alcohol abuse [F10.10] 03/20/2015  . Severe recurrent major depression without psychotic features ([place]) [F33.2] 03/20/2015  . Suicidal ideations [R45.851] 03/20/2015  . Hyperthyroidism [E05.90] 11/25/2014  . Alcohol use disorder, severe, dependence ([place]) [F10.20] 11/22/2014  . Thyroid disease [E07.9] 11/22/2014  . Graves' disease [E05.00] 11/22/2014  . Bipolar 1 disorder, depressed, severe ([place]) [F31.4] 11/21/2014  . Aspiration pneumonia ([place]) [J69.0] 07/15/2013  . Hypokalemia [E87.6] 07/15/2013  . Elevated LFTs [R79.89] 07/15/2013  . Microcytic anemia [D50.9] 07/15/2013  . Thrombocytopenia ([place]) [D69.6] 07/15/2013  . 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 Adrenal nodule, left [E27.9] 07/15/2013  . Chest pain, midsternal [R07.2] 09/24/2012  . Papules [L98.9] 09/10/2012  . Suicidal ideation [R45.851] 09/09/2012  . Chronic blood loss anemia [D50.0] 09/09/2012  . Acute alcoholic pancreatitis [T88.8] 09/08/2012  . Rectal bleeding [K62.5] 09/08/2012  . Cocaine abuse [F14.10] 09/08/2012  . Anemia [D64.9] 09/08/2012  . Left adrenal mass ([place]) [E27.9] 09/08/2012  . Pseudogout of knee [M11.869] 08/27/2011  . Substance abuse [F19.10] 08/27/2011  . Chronic pain [G89.29] 08/27/2011   Total Time spent with patient:  25 minutes   Past Psychiatric History: SEE ABOVE  Past Medical History:  Past Medical History  Diagnosis Date  . Arthritis   . Substance abuse     Cocaine, marijuana, and alcohol  . Suicidal ideation 09/09/2012  . Chronic blood loss anemia 09/09/2012  .  Rectal bleeding 09/08/2012  . Acute alcoholic pancreatitis 6/44/0347  . Depression   . Hyperthyroidism   . Gout     Past Surgical History  Procedure Laterality Date  . Orthopedic surgery      Left foot surgery  . Left foot surgery    . Left cataract  extraction     Family History:  Family History  Problem Relation Age of Onset  . Heart disease Mother   . Arthritis Mother   . Alzheimer's disease Mother   . Cancer Father     prostate  . Arthritis Father    Family Psychiatric  History: SEE ABOVE Social History:  History  Alcohol Use No     History  Drug Use  . Yes  . Special: Marijuana, Cocaine    Comment: Former user - no clean for 2 years    Social History   Social History  . Marital Status: Single    Spouse Name: N/A  . Number of Children: 2  . Years of Education: 12   Occupational History  . Teaching laboratory technician.    Social History Main Topics  . Smoking status: Former Smoker -- 0.25 packs/day for 10 years    Types: Cigarettes  . Smokeless tobacco: Never Used  . Alcohol Use: No  . Drug Use: Yes    Special: Marijuana, Cocaine     Comment: Former user - no clean for 2 years  . Sexual Activity: No   Other Topics Concern  . None   Social History Narrative   Widowed.  Wife died of leukemia.  2 children.  Lives in the [place].   Fun: [employer]   Denies religious beliefs effecting health care.    Additional Social History:   Sleep: improved   Appetite:  Fair- weight, BMI have remained stable compared to prior admission last year   Current Medications: Current Facility-Administered Medications  Medication Dose Route Frequency Provider Last Rate Last Dose  . alum & mag hydroxide-simeth (MAALOX/MYLANTA) 200-200-20 MG/5ML suspension 30 mL  30 mL Oral Q4H PRN Harriet Butte, NP      . feeding supplement (ENSURE ENLIVE) (ENSURE ENLIVE) liquid 237 mL  237 mL Oral TID BM Derrill Center, NP   237 mL at 01/21/16 1400  . [START ON 01/22/2016] ferrous sulfate tablet 325 mg  325 mg Oral Q breakfast Myer Peer Cobos, MD      . hydrOXYzine (ATARAX/VISTARIL) tablet 25 mg  25 mg Oral Q6H PRN Harriet Butte, NP   25 mg at 01/20/16 1937  . ibuprofen (ADVIL,MOTRIN) tablet 600 mg  600 mg Oral Q6H PRN Harriet Butte, NP   600  mg at 01/20/16 1937  . magnesium hydroxide (MILK OF MAGNESIA) suspension 30 mL  30 mL Oral Daily PRN Harriet Butte, NP      . methimazole (TAPAZOLE) tablet 10 mg  10 mg Oral TID Derrill Center, NP   10 mg at 01/21/16 1201  . mirtazapine (REMERON) tablet 15 mg  15 mg Oral QHS Jenne Campus, MD      . multivitamin with minerals tablet 1 tablet  1 tablet Oral Daily Derrill Center, NP   1 tablet at 01/21/16 4259    Lab Results:  Results for orders placed or performed during the hospital encounter of 01/18/16 (from the past 48 hour(s))  TSH     Status: Abnormal   Collection Time: 01/19/16  6:20 PM  Result Value Ref Range   TSH  0.010 (L) 0.350 - 4.500 uIU/mL    Comment: Performed at [facility]  T4     Status: Abnormal   Collection Time: 01/19/16  6:20 PM  Result Value Ref Range   T4, Total 22.0 (H) 4.5 - 12.0 ug/dL    Comment: (NOTE) Results confirmed on dilution. Performed At: [facility] [place], [place] [id number] Lindon Romp MD NW:[id number] Performed at [facility]     Physical Findings: AIMS: Facial and Oral Movements Muscles of Facial Expression: None, normal Lips and Perioral Area: None, normal Jaw: None, normal Tongue: None, normal,Extremity Movements Upper (arms, wrists, hands, fingers): None, normal Lower (legs, knees, ankles, toes): None, normal, Trunk Movements Neck, shoulders, hips: None, normal, Overall Severity Severity of abnormal movements (highest score from questions above): None, normal Incapacitation due to abnormal movements: None, normal Patient's awareness of abnormal movements (rate only patient's report): No Awareness, Dental Status Current problems with teeth and/or dentures?: No Does patient usually wear dentures?: No  CIWA:  CIWA-Ar Total: 2 COWS:     Musculoskeletal: Strength & Muscle Tone: within normal limits Gait & Station: normal Patient leans: N/A  Psychiatric  Specialty Exam: Review of Systems  Psychiatric/Behavioral: Positive for depression. Negative for suicidal ideas. The patient is nervous/anxious. The patient does not have insomnia.   All other systems reviewed and are negative.   Blood pressure 108/48, pulse 88, temperature 98.2 F (36.8 C), temperature source Oral, resp. rate 20, height '6\' 2"'  (1.88 m), weight 117 lb (53.071 kg).Body mass index is 15.02 kg/(m^2).  General Appearance:  Fairly groomed   Engineer, water::  Good  Speech:  Clear and Coherent  Volume:  Normal  Mood:  Depressed, but states feeling better than on admission  Affect:  Constricted but reactive   Thought Process:   linear  Orientation:  Full (Time, Place, and Person)  Thought Content:  Denies hallucinations, no delusions , not internally preoccupied   Suicidal Thoughts:  No at this time denies suicidal or homicidal ideations, contracts for safety on unit   Homicidal Thoughts:  No  Judgment: improving     Insight:  Fair  Psychomotor Activity:  Normal  Concentration:  Good  Recall:   Good   Fund of Knowledge: good   Language: good   Akathisia:  No  Handed:  Right  AIMS (if indicated):     Assets:  Desire for Improvement Financial Resources/Insurance Housing Resilience  ADL's:  Intact  Cognition: WNL  Sleep:  Number of Hours: 6.25   Assessment - patient reports partially improved mood , states he feels less depressed today. No suicidal ideations at this time. Does report ongoing depression, and on previous admission was tried on Remeron, which he states was well tolerated . Describes dizziness, which may be related to restarting B blocker . On Tapazole for hyperthyroidism, which he states he has been compliant with, but TSH still suppressed and T4 elevated .   Treatment Plan Summary: Daily contact with patient to assess and evaluate symptoms and progress in treatment and Medication management  Encourage increased group, milieu participation to work on coping  skills and symptom reduction. Continue Tapazole, which he is taking at 10 mgrs TID for history of hyperthyroidism  Start Remeron 15 mgrs QHS for management of depression D/C Atenolol at this time to minimize risk of dizziness, low BP. Continue Ensure  Supplement Patient has chronic anemia- likely related to chronic disease- interested in starting Iron supplementation  Neita Garnet, MD 01/21/2016, 4:03 PM

## 2016-01-21 NOTE — Progress Notes (Signed)
Adult Psychoeducational Group Note  Date:  01/21/2016 Time:  8:38 PM  Group Topic/Focus:  Wrap-Up Group:   The focus of this group is to help patients review their daily goal of treatment and discuss progress on daily workbooks.  Participation Level:  Active  Participation Quality:  Appropriate  Affect:  Appropriate  Cognitive:  Appropriate  Insight: Appropriate  Engagement in Group:  Engaged  Modes of Intervention:  Discussion  Additional Comments:  Pt was pleasant. Pt rated his overall day a 5 out of 10 because his blood pressure was low and he doesn't know why. Pt reported that he achieved his goal for the day, which was to get help and talk to his doctor. Pt noted that the highlight of his day was playing cards with other patients on the unit.   Phillip Kemp 01/21/2016, 9:20 PM

## 2016-01-21 NOTE — [facility] Group Notes (Signed)
[facility] LCSW Group Therapy  01/21/2016 1:15pm  Type of Therapy:  Group Therapy vercoming Obstacles  Pt did not attend, declined invitation.   Ofelia Podolski Carter, LCSWA 01/21/2016 3:25 PM  

## 2016-01-21 NOTE — [facility] Group Notes (Signed)
[facility] LCSW Aftercare Discharge Planning Group Note  01/21/2016 8:45 AM  Participation Quality: Alert, Appropriate and Oriented  Mood/Affect: Flat  Depression Rating: 9  Anxiety Rating: 8  Thoughts of Suicide: Pt endorses SI  Will you contract for safety? Yes  Current AVH: Pt denies  Plan for Discharge/Comments: Pt attended discharge planning group and actively participated in group. CSW discussed suicide prevention education with the group and encouraged them to discuss discharge planning and any relevant barriers. Pt reports that he would like a referral for a new therapist. He also requests pants and shirts from the clothing closet.  Transportation Means: Pt reports access to transportation  Supports: No supports mentioned at this time  Chad Cordial, LCSWA 01/21/2016 9:34 AM

## 2016-01-21 NOTE — Tx Team (Signed)
Interdisciplinary Treatment Plan Update (Adult) Date: 01/21/2016   Date: 01/21/2016 10:07 AM  Progress in Treatment:  Attending groups: Yes  Participating in groups: Yes  Taking medication as prescribed: Yes  Tolerating medication: Yes  Family/Significant othe contact made: No, CSW attempting to make contact with sister Patient understands diagnosis: Yes AEB seeking help with depression Discussing patient identified problems/goals with staff: Yes  Medical problems stabilized or resolved: Yes  Denies suicidal/homicidal ideation: No, Pt endorses SI Patient has not harmed self or Others: Yes   New problem(s) identified: None identified at this time.   Discharge Plan or Barriers: Pt will return home and follow-up with outpatient resources  Additional comments:  Patient and CSW reviewed pt's identified goals and treatment plan. Patient verbalized understanding and agreed to treatment plan. CSW reviewed [facility] "Discharge Process and Patient Involvement" Form. Pt verbalized understanding of information provided and signed form.   Reason for Continuation of Hospitalization:  Anxiety Depression Medication stabilization Suicidal ideation  Estimated length of stay: 3-5 days  Review of initial/current patient goals per problem list:   1.  Goal(s): Patient will participate in aftercare plan  Met:  Yes  Target date: 3-5 days from date of admission   As evidenced by: Patient will participate within aftercare plan AEB aftercare provider and housing plan at discharge being identified.   01/21/16: Pt will return home and follow-up with outpatient resources  2.  Goal (s): Patient will exhibit decreased depressive symptoms and suicidal ideations.  Met:  No  Target date: 3-5 days from date of admission   As evidenced by: Patient will utilize self rating of depression at 3 or below and demonstrate decreased signs of depression or be deemed stable for discharge by MD.  01/21/16: Pt rates depression  at 8/10 and endorses SI  3.  Goal(s): Patient will demonstrate decreased signs and symptoms of anxiety.  Met:  No  Target date: 3-5 days from date of admission   As evidenced by: Patient will utilize self rating of anxiety at 3 or below and demonstrated decreased signs of anxiety, or be deemed stable for discharge by MD  01/21/16: Pt rates anxiety at 9/10  Attendees:  Patient:    Family:    Physician: Dr. Cobos, MD  01/21/2016 10:07 AM  Nursing: Jennifer Clark, RN Case manager  01/21/2016 10:07 AM  Clinical Social Worker  Carter, LCSWA 01/21/2016 10:07 AM  Other: Kristin Drinkard, LCSWA 01/21/2016 10:07 AM  Clinical:  Sara Twyman, RN; Beverly Knight, RN 01/21/2016 10:07 AM  Other: , RN Charge Nurse 01/21/2016 10:07 AM  Other: Dolora Sutton, P4CC     Carter, LCSWA Clinical Social Work [phone]      

## 2016-01-21 NOTE — Progress Notes (Signed)
Patient ID: Phillip Kemp, male   DOB: 10-13-1961, 55 y.o.   MRN: [id number] D: Patient in dayroom playing cards with peers. Pt went to [facility] radiology for chest xray referred by Dr. Sanjuana Letters. Pt reports he is tolerating medication well. Pt denies SI/HI/AVH. Pt attended and participated in evening wrap up group. Cooperative with assessment. No acute distressed noted at this time.  A: Met with pt 1:1. Medications administered as prescribed. Support and encouragement provided.  R: Patient remains safe and complaint with medications.

## 2016-01-21 NOTE — [facility] Suicide Risk Assessment (Signed)
[facility] INPATIENT:  Family/Significant Other Suicide Prevention Education  Suicide Prevention Education:  Contact Attempts: Leelynn Whetsel, Pt's sister [phone], (name of family member/significant other) has been identified by the patient as the family member/significant other with whom the patient will be residing, and identified as the person(s) who will aid the patient in the event of a mental health crisis.  With written consent from the patient, two attempts were made to provide suicide prevention education, prior to and/or following the patient's discharge.  We were unsuccessful in providing suicide prevention education.  A suicide education pamphlet was given to the patient to share with family/significant other.  Date and time of first attempt: 01/20/16 @ 4:25pm Date and time of second attempt: 01/21/16 @ 4:20pm  Elaina Hoops 01/21/2016, 4:21 PM

## 2016-01-21 NOTE — Progress Notes (Signed)
Writer spoke with patient  And he reports that today he has had a lot on his mind concerning his housing and the people he let in his apartment and his relapse. He reports that his diastolic pressure has been low today and he has felt light headed most of the day. He reports drinking gatorade throughout the day. He has been in the dayroom watching the football game. Support and encouragement given, safety maintained on unit with 15 min checks.

## 2016-01-22 ENCOUNTER — Ambulatory Visit (HOSPITAL_COMMUNITY)
Admit: 2016-01-22 | Discharge: 2016-01-22 | Disposition: A | Discharge status: Home / Self Care | Payer: Medicare Other | Attending: Internal Medicine | Admitting: Internal Medicine

## 2016-01-22 DIAGNOSIS — J189 Pneumonia, unspecified organism: Secondary | ICD-10-CM | POA: Insufficient documentation

## 2016-01-22 DIAGNOSIS — R599 Enlarged lymph nodes, unspecified: Secondary | ICD-10-CM | POA: Insufficient documentation

## 2016-01-22 DIAGNOSIS — E278 Other specified disorders of adrenal gland: Secondary | ICD-10-CM | POA: Insufficient documentation

## 2016-01-22 DIAGNOSIS — E0500 Thyrotoxicosis with diffuse goiter without thyrotoxic crisis or storm: Secondary | ICD-10-CM

## 2016-01-22 DIAGNOSIS — F1410 Cocaine abuse, uncomplicated: Secondary | ICD-10-CM

## 2016-01-22 LAB — COMPREHENSIVE METABOLIC PANEL
ALT: 16 U/L — AB (ref 17–63)
AST: 22 U/L (ref 15–41)
Albumin: 3.2 g/dL — ABNORMAL LOW (ref 3.5–5.0)
Alkaline Phosphatase: 140 U/L — ABNORMAL HIGH (ref 38–126)
Anion gap: 8 (ref 5–15)
BILIRUBIN TOTAL: 0.4 mg/dL (ref 0.3–1.2)
BUN: 24 mg/dL — AB (ref 6–20)
CHLORIDE: 110 mmol/L (ref 101–111)
CO2: 25 mmol/L (ref 22–32)
CREATININE: 0.56 mg/dL — AB (ref 0.61–1.24)
Calcium: 9.7 mg/dL (ref 8.9–10.3)
GFR calc Af Amer: >60 mL/min (ref >60)
GLUCOSE: 95 mg/dL (ref 65–99)
Potassium: 4.4 mmol/L (ref 3.5–5.1)
Sodium: 143 mmol/L (ref 135–145)
TOTAL PROTEIN: 6.7 g/dL (ref 6.5–8.1)

## 2016-01-22 LAB — CBC WITH DIFFERENTIAL/PLATELET
BASOS ABS: 0 10*3/uL (ref 0.0–0.1)
Basophils Relative: 0 %
Eosinophils Absolute: 0.1 10*3/uL (ref 0.0–0.7)
Eosinophils Relative: 3 %
HEMATOCRIT: 27.6 % — AB (ref 39.0–52.0)
Hemoglobin: 9 g/dL — ABNORMAL LOW (ref 13.0–17.0)
Lymphocytes Relative: 31 %
Lymphs Abs: 1.5 10*3/uL (ref 0.7–4.0)
MCH: 26.2 pg (ref 26.0–34.0)
MCHC: 32.6 g/dL (ref 30.0–36.0)
MCV: 80.5 fL (ref 78.0–100.0)
Monocytes Absolute: 0.7 10*3/uL (ref 0.1–1.0)
Monocytes Relative: 13 %
NEUTROS ABS: 2.7 10*3/uL (ref 1.7–7.7)
NEUTROS PCT: 53 %
Platelets: 124 10*3/uL — ABNORMAL LOW (ref 150–400)
RBC: 3.43 MIL/uL — AB (ref 4.22–5.81)
RDW: 16.3 % — ABNORMAL HIGH (ref 11.5–15.5)
WBC: 5 10*3/uL (ref 4.0–10.5)

## 2016-01-22 LAB — MAGNESIUM: Magnesium: 1.8 mg/dL (ref 1.7–2.4)

## 2016-01-22 LAB — T4, FREE: FREE T4: 4.16 ng/dL — AB (ref 0.61–1.12)

## 2016-01-22 LAB — PHOSPHORUS: Phosphorus: 4.1 mg/dL (ref 2.5–4.6)

## 2016-01-22 MED ORDER — AMOXICILLIN-POT CLAVULANATE 875-125 MG PO TABS
1.0000 | ORAL_TABLET | Freq: Two times a day (BID) | ORAL | Status: DC
  Administered 2016-01-22 – 2016-01-25 (×6): 1 via ORAL
  Filled 2016-01-22 (×11): qty 1

## 2016-01-22 MED ORDER — IOHEXOL 300 MG/ML  SOLN
75.0000 mL | Freq: Once | INTRAMUSCULAR | Status: AC | PRN
  Administered 2016-01-22: 75 mL via INTRAVENOUS

## 2016-01-22 NOTE — [facility] Group Notes (Signed)
[facility] LCSW Group Therapy 01/22/2016 1:15 PM  Type of Therapy: Group Therapy- Feelings about Diagnosis  Participation Level: Minimal  Participation Quality:  Reserved  Affect:  Flat  Cognitive: Alert and Oriented   Insight:  Developing   Engagement in Therapy: Limited  Modes of Intervention: Clarification, Confrontation, Discussion, Education, Exploration, Limit-setting, Orientation, Problem-solving, Rapport Building, Dance movement psychotherapist, Socialization and Support  Description of Group:   This group will allow patients to explore their thoughts and feelings about diagnoses they have received. Patients will be guided to explore their level of understanding and acceptance of these diagnoses. Facilitator will encourage patients to process their thoughts and feelings about the reactions of others to their diagnosis, and will guide patients in identifying ways to discuss their diagnosis with significant others in their lives. This group will be process-oriented, with patients participating in exploration of their own experiences as well as giving and receiving support and challenge from other group members.  Summary of Progress/Problems:  Pt did not participate in group discussion.  Therapeutic Modalities:   Cognitive Behavioral Therapy Solution Focused Therapy Motivational Interviewing Relapse Prevention Therapy  Chad Cordial, LCSWA 01/22/2016 3:08 PM

## 2016-01-22 NOTE — Plan of Care (Signed)
Problem: Diagnosis: Increased Risk For Suicide Attempt Goal: STG-Patient Will Comply With Medication Regime Outcome: Progressing Pt compliant with medication regime     

## 2016-01-22 NOTE — Progress Notes (Signed)
Adult Psychoeducational Group Note  Date:  01/22/2016 Time:  0900   Group Topic/Focus:  Orientation:   The focus of this group is to educate the patient on the purpose and policies of crisis stabilization and provide a format to answer questions about their admission.  The group details unit policies and expectations of patients while admitted.  Participation Level:  Active  Participation Quality:  Appropriate  Affect:  Appropriate  Cognitive:  Appropriate  Insight: Appropriate  Engagement in Group:  Engaged  Modes of Intervention:  Orientation  Additional Comments:    Kenson Groh L 01/22/2016, 12:48 PM

## 2016-01-22 NOTE — Progress Notes (Signed)
Recreation Therapy Notes  Animal-Assisted Activity (AAA) Program Checklist/Progress Notes Patient Eligibility Criteria Checklist & Daily Group note for Rec Tx Intervention  Date: 02.07.2017 Time: 2:45pm Location: [address] Dayroom    AAA/T Program Assumption of Risk Form signed by Patient/ or Parent Legal Guardian yes  Patient is free of allergies or sever asthma yes  Patient reports no fear of animals yes  Patient reports no history of cruelty to animals yes  Patient understands his/her participation is voluntary yes  Patient washes hands before animal contact yes  Patient washes hands after animal contact yes  Behavioral Response: Appropriate  Education: Hand Washing, Appropriate Animal Interaction   Education Outcome: Acknowledges education.   Clinical Observations/Feedback: Patient attended session, petting therapy dog and interacting with peers in group appropriately.   Creasie Lacosse L Sadey Yandell, LRT/CTRS        Taira Knabe L 01/22/2016 3:12 PM 

## 2016-01-22 NOTE — Progress Notes (Signed)
D: Pt presents anxious and bright on approach. Pt reports feeling better this morning. Pt rates depression 8/10. Hopeless 0/10. Anxiety 8/10. Pt reported passive suicidal thoughts on self inventory sheet but denies any active suicidal thoughts with Clinical research associate. Pt verbally contracts for safety. Pt reports poor appetite this morning. Pt did not eat breakfast. Pt requested ensure this morning. Pt compliant with taking meds and attending groups.  A: Medications administered as ordered per MD. Verbal support provided. Pt encouraged to attend groups. 15 minute checks performed for safety. CT appt scheduled for 4 pm today. Transportation set-up for 3 pm d/t shift change at [facility]. R: Pt verbalizes understanding of med regimen. Pt receptive to tx.

## 2016-01-22 NOTE — Progress Notes (Addendum)
Patient ID: Phillip Kemp, male   DOB: Nov 20, 1961, 55 y.o.   MRN: [id number] [facility] MD Progress Note  01/22/2016 5:07 PM Phillip Kemp  MRN:  [id number]   Subjective:   Patient states mood is improved , feels less depressed . Remains concerned and worried about his medical issues. Denies any medication side effects.   Objective: I have discussed case with treatment team and have met with patient. Patient has been visible on the unit, going to groups, interacting with peers .  No disruptive or agitated behaviors on unit . Appreciate Hospitalist follow up, for ongoing management of hyperthyroidism . Cx Ray demonstrated R infrahilar fullness and follow up CT scan is suggestive of pneumonia. Of note, patient states " I had been feeling kind of sick before I cam in , but I am better now. " No coughing noted at this time. Temp 98 degrees .  Denies medication side effects.  States appetite and sleep are improved .   Principal Problem: MDD (major depressive disorder), recurrent severe, without psychosis ([place]) Diagnosis:   Patient Active Problem List   Diagnosis Date Noted  . MDD (major depressive disorder), recurrent severe, without psychosis ([place]) [F33.2] 01/18/2016  . Sleep disturbance [G47.9] 07/20/2015  . Major depressive disorder, recurrent, severe without psychotic features ([place]) [F33.2]   . Major depression, recurrent, chronic ([place]) [F33.9] 06/22/2015  . Alcohol abuse [F10.10] 03/20/2015  . Severe recurrent major depression without psychotic features ([place]) [F33.2] 03/20/2015  . Suicidal ideations [R45.851] 03/20/2015  . Hyperthyroidism [E05.90] 11/25/2014  . Alcohol use disorder, severe, dependence ([place]) [F10.20] 11/22/2014  . Thyroid disease [E07.9] 11/22/2014  . Graves' disease [E05.00] 11/22/2014  . Bipolar 1 disorder, depressed, severe ([place]) [F31.4] 11/21/2014  . Aspiration pneumonia ([place]) [J69.0] 07/15/2013  . Hypokalemia [E87.6] 07/15/2013  . Elevated LFTs [R79.89] 07/15/2013   . Microcytic anemia [D50.9] 07/15/2013  . Thrombocytopenia ([place]) [D69.6] 07/15/2013  . 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 Adrenal nodule, left [E27.9] 07/15/2013  . Chest pain, midsternal [R07.2] 09/24/2012  . Papules [L98.9] 09/10/2012  . Suicidal ideation [R45.851] 09/09/2012  . Chronic blood loss anemia [D50.0] 09/09/2012  . Acute alcoholic pancreatitis [S34.1] 09/08/2012  . Rectal bleeding [K62.5] 09/08/2012  . Cocaine abuse [F14.10] 09/08/2012  . Anemia [D64.9] 09/08/2012  . Left adrenal mass ([place]) [E27.9] 09/08/2012  . Pseudogout of knee [M11.869] 08/27/2011  . Substance abuse [F19.10] 08/27/2011  . Chronic pain [G89.29] 08/27/2011   Total Time spent with patient:  20 minutes   Past Psychiatric History: SEE ABOVE  Past Medical History:  Past Medical History  Diagnosis Date  . Arthritis   . Substance abuse     Cocaine, marijuana, and alcohol  . Suicidal ideation 09/09/2012  . Chronic blood loss anemia 09/09/2012  . Rectal bleeding 09/08/2012  . Acute alcoholic pancreatitis 9/62/2297  . Depression   . Hyperthyroidism   . Gout     Past Surgical History  Procedure Laterality Date  . Orthopedic surgery      Left foot surgery  . Left foot surgery    . Left cataract extraction     Family History:  Family History  Problem Relation Age of Onset  . Heart disease Mother   . Arthritis Mother   . Alzheimer's disease Mother   . Cancer Father     prostate  . Arthritis Father    Family Psychiatric  History: SEE ABOVE Social History:  History  Alcohol Use No     History  Drug Use  .  Yes  . Special: Marijuana, Cocaine    Comment: Former user - no clean for 2 years    Social History   Social History  . Marital Status: Single    Spouse Name: N/A  . Number of Children: 2  . Years of Education: 12   Occupational History  . Teaching laboratory technician.    Social History Main Topics  . Smoking status: Former Smoker -- 0.25 packs/day for 10 years    Types: Cigarettes  .  Smokeless tobacco: Never Used  . Alcohol Use: No  . Drug Use: Yes    Special: Marijuana, Cocaine     Comment: Former user - no clean for 2 years  . Sexual Activity: No   Other Topics Concern  . None   Social History Narrative   Widowed.  Wife died of leukemia.  2 children.  Lives in the [place].   Fun: [employer]   Denies religious beliefs effecting health care.    Additional Social History:   Sleep: improved   Appetite:  Fair- weight, BMI have remained stable compared to prior admission last year   Current Medications: Current Facility-Administered Medications  Medication Dose Route Frequency Provider Last Rate Last Dose  . alum & mag hydroxide-simeth (MAALOX/MYLANTA) 200-200-20 MG/5ML suspension 30 mL  30 mL Oral Q4H PRN Harriet Butte, NP      . atenolol (TENORMIN) tablet 25 mg  25 mg Oral Daily Simbiso Ranga, MD   25 mg at 01/22/16 0808  . feeding supplement (ENSURE ENLIVE) (ENSURE ENLIVE) liquid 237 mL  237 mL Oral TID BM Derrill Center, NP   237 mL at 01/22/16 1632  . ferrous sulfate tablet 325 mg  325 mg Oral Q breakfast Jenne Campus, MD   325 mg at 01/22/16 1610  . folic acid (FOLVITE) tablet 1 mg  1 mg Oral Daily Simbiso Ranga, MD   1 mg at 01/22/16 0808  . hydrOXYzine (ATARAX/VISTARIL) tablet 25 mg  25 mg Oral Q6H PRN Harriet Butte, NP   25 mg at 01/20/16 1937  . ibuprofen (ADVIL,MOTRIN) tablet 600 mg  600 mg Oral Q6H PRN Harriet Butte, NP   600 mg at 01/22/16 1306  . magnesium hydroxide (MILK OF MAGNESIA) suspension 30 mL  30 mL Oral Daily PRN Harriet Butte, NP      . methimazole (TAPAZOLE) tablet 10 mg  10 mg Oral TID Derrill Center, NP   10 mg at 01/22/16 1633  . mirtazapine (REMERON) tablet 15 mg  15 mg Oral QHS Jenne Campus, MD   15 mg at 01/21/16 2249  . multivitamin with minerals tablet 1 tablet  1 tablet Oral Daily Derrill Center, NP   1 tablet at 01/22/16 [phone]  . thiamine (VITAMIN B-1) tablet 100 mg  100 mg Oral Daily Simbiso Ranga, MD   100 mg  at 01/22/16 0808    Lab Results:  Results for orders placed or performed during the hospital encounter of 01/18/16 (from the past 48 hour(s))  CBC with Differential/Platelet     Status: Abnormal   Collection Time: 01/22/16  7:20 AM  Result Value Ref Range   WBC 5.0 4.0 - 10.5 K/uL   RBC 3.43 (L) 4.22 - 5.81 MIL/uL   Hemoglobin 9.0 (L) 13.0 - 17.0 g/dL   HCT 27.6 (L) 39.0 - 52.0 %   MCV 80.5 78.0 - 100.0 fL   MCH 26.2 26.0 - 34.0 pg   MCHC 32.6 30.0 -  36.0 g/dL   RDW 16.3 (H) 11.5 - 15.5 %   Platelets 124 (L) 150 - 400 K/uL   Neutrophils Relative % 53 %   Neutro Abs 2.7 1.7 - 7.7 K/uL   Lymphocytes Relative 31 %   Lymphs Abs 1.5 0.7 - 4.0 K/uL   Monocytes Relative 13 %   Monocytes Absolute 0.7 0.1 - 1.0 K/uL   Eosinophils Relative 3 %   Eosinophils Absolute 0.1 0.0 - 0.7 K/uL   Basophils Relative 0 %   Basophils Absolute 0.0 0.0 - 0.1 K/uL    Comment: Performed at [facility]  Comprehensive metabolic panel     Status: Abnormal   Collection Time: 01/22/16  7:20 AM  Result Value Ref Range   Sodium 143 135 - 145 mmol/L   Potassium 4.4 3.5 - 5.1 mmol/L   Chloride 110 101 - 111 mmol/L   CO2 25 22 - 32 mmol/L   Glucose, Bld 95 65 - 99 mg/dL   BUN 24 (H) 6 - 20 mg/dL   Creatinine, Ser 0.56 (L) 0.61 - 1.24 mg/dL   Calcium 9.7 8.9 - 10.3 mg/dL   Total Protein 6.7 6.5 - 8.1 g/dL   Albumin 3.2 (L) 3.5 - 5.0 g/dL   AST 22 15 - 41 U/L   ALT 16 (L) 17 - 63 U/L   Alkaline Phosphatase 140 (H) 38 - 126 U/L   Total Bilirubin 0.4 0.3 - 1.2 mg/dL   GFR calc non Af Amer >60 >60 mL/min   GFR calc Af Amer >60 >60 mL/min    Comment: (NOTE) The eGFR has been calculated using the CKD EPI equation. This calculation has not been validated in all clinical situations. eGFR's persistently <60 mL/min signify possible Chronic Kidney Disease.    Anion gap 8 5 - 15    Comment: Performed at [facility]  T4, free     Status: Abnormal   Collection Time: 01/22/16   7:20 AM  Result Value Ref Range   Free T4 4.16 (H) 0.61 - 1.12 ng/dL    Comment: Performed at [facility]  Magnesium     Status: None   Collection Time: 01/22/16  7:20 AM  Result Value Ref Range   Magnesium 1.8 1.7 - 2.4 mg/dL    Comment: Performed at [facility]  Phosphorus     Status: None   Collection Time: 01/22/16  7:20 AM  Result Value Ref Range   Phosphorus 4.1 2.5 - 4.6 mg/dL    Comment: Performed at [facility]    Physical Findings: AIMS: Facial and Oral Movements Muscles of Facial Expression: None, normal Lips and Perioral Area: None, normal Jaw: None, normal Tongue: None, normal,Extremity Movements Upper (arms, wrists, hands, fingers): None, normal Lower (legs, knees, ankles, toes): None, normal, Trunk Movements Neck, shoulders, hips: None, normal, Overall Severity Severity of abnormal movements (highest score from questions above): None, normal Incapacitation due to abnormal movements: None, normal Patient's awareness of abnormal movements (rate only patient's report): No Awareness, Dental Status Current problems with teeth and/or dentures?: No Does patient usually wear dentures?: No  CIWA:  CIWA-Ar Total: 2 COWS:     Musculoskeletal: Strength & Muscle Tone: within normal limits Gait & Station: normal Patient leans: N/A  Psychiatric Specialty Exam: Review of Systems  Psychiatric/Behavioral: Positive for depression. Negative for suicidal ideas. The patient is nervous/anxious. The patient does not have insomnia.   All other systems reviewed and are negative.  no SOB at room air, no chest pain at this time, no coughing at present   Blood pressure 131/58, pulse 99, temperature 98 F (36.7 C), temperature source Oral, resp. rate 16, height _0  (1.88 m), weight 117 lb (53.071 kg).Body mass index is 15.02 kg/(m^2).  General Appearance:  Fairly groomed   Engineer, water::  Good  Speech:  Clear and Coherent  Volume:   Normal  Mood:   Mood improving, less depressed   Affect:  Still constricted, anxious, but improving   Thought Process:   linear  Orientation:  Full (Time, Place, and Person)  Thought Content:  Denies hallucinations, no delusions , not internally preoccupied   Suicidal Thoughts:  No at this time denies suicidal or homicidal ideations, contracts for safety on unit   Homicidal Thoughts:  No  Judgment: improving     Insight:  Fair  Psychomotor Activity:  Normal  Concentration:  Good  Recall:   Good   Fund of Knowledge: good   Language: good   Akathisia:  No  Handed:  Right  AIMS (if indicated):     Assets:  Desire for Improvement Financial Resources/Insurance Housing Resilience  ADL's:  Intact  Cognition: WNL  Sleep:  Number of Hours: 6.5   Assessment - patient presents with partially improving mood and range of affect. He does remain anxious , worried, and today focused on his physical health, chronic illnesses .  No SI. Tolerating Remeron well. Appreciate Hospitalist consult and follow up. Work up revealed R pneumonia, although patient states he had been feeling physically ill a few days ago but is now better. Today no coughing and no fever. No SOB.    Treatment Plan Summary: Daily contact with patient to assess and evaluate symptoms and progress in treatment and Medication management  Encourage increased group, milieu participation to work on coping skills and symptom reduction. Continue Tapazole, which he is taking at 10 mgrs TID for history of hyperthyroidism  Continue  Remeron 15 mgrs QHS for management of depression Continue Ensure  Supplement Hospitalist service following - I have discussed case with Dr. Charlies Silvers , recommendation is to start Augmentin 875 mgrs Q 12 hours x  7 days, for pneumonia treatment .    Neita Garnet, MD 01/22/2016, 5:07 PM

## 2016-01-22 NOTE — Progress Notes (Signed)
Patient ID: Phillip Kemp, male   DOB: 04/21/1961, 55 y.o.   MRN: [id number] D: Patient calm and cooperative playing cards with peers. Pt reports he is tolerating medication well. Pt denies SI/HI/AVH and pain. Pt attended and participated in evening wrap up group. Cooperative with assessment.  A: Met with pt 1:1. Medications administered as prescribed. Support and encouragement provided.  R: Patient remains safe and complaint with medications.  

## 2016-01-22 NOTE — Progress Notes (Signed)
CT chest with adn without contrast order placed for evaluate of right infrahilar filling seen on CXR.  Manson Passey [facility] [phone]

## 2016-01-22 NOTE — Progress Notes (Signed)
Adult Psychoeducational Group Note  Date:  01/22/2016 Time:  8:30 PM  Group Topic/Focus:  Wrap-Up Group:   The focus of this group is to help patients review their daily goal of treatment and discuss progress on daily workbooks.  Participation Level:  Active  Participation Quality:  Appropriate  Affect:  Appropriate  Cognitive:  Appropriate  Insight: Appropriate  Engagement in Group:  Engaged  Modes of Intervention:  Discussion  Additional Comments:  Pt was pleasant during wrap-up group. Pt rated his overall day a 7 out of 10 because he was still trying to figure out what was going on with him today (as far as physical health). Pt reported that he achieved his goal for the day, which was to figure out what was going on with his health.   Cleotilde Neer 01/22/2016, 9:33 PM

## 2016-01-22 NOTE — Plan of Care (Signed)
Problem: Alteration in mood & ability to function due to Goal: STG-Patient will comply with prescribed medication regimen (Patient will comply with prescribed medication regimen)  Outcome: Progressing Pt compliant with medication regime     

## 2016-01-23 LAB — T3: T3, Total: 567 ng/dL — ABNORMAL HIGH (ref 71–180)

## 2016-01-23 MED ORDER — MIRTAZAPINE 30 MG PO TABS
30.0000 mg | ORAL_TABLET | Freq: Every day | ORAL | Status: DC
  Administered 2016-01-23: 30 mg via ORAL
  Filled 2016-01-23 (×4): qty 1

## 2016-01-23 NOTE — Progress Notes (Signed)
Adult Psychoeducational Group Note  Date:  01/23/2016 Time:  8:26 PM  Group Topic/Focus:  Wrap-Up Group:   The focus of this group is to help patients review their daily goal of treatment and discuss progress on daily workbooks.  Participation Level:  Active  Participation Quality:  Appropriate  Affect:  Appropriate  Cognitive:  Appropriate  Insight: Appropriate  Engagement in Group:  Engaged  Modes of Intervention:  Discussion  Additional Comments:  Pt was pleasant during wrap-up group. Pt rated his overall day a 5 out of 10 because he was drowsy most of the day. Pt reported that he did not have a goal for the day other than "to get healthier", however pt noted that playing spades with other patients on the unit was the highlight of his day.   Cleotilde Neer 01/23/2016, 9:08 PM

## 2016-01-23 NOTE — [facility] Group Notes (Signed)
[facility] LCSW Aftercare Discharge Planning Group Note  01/23/2016 8:45 AM  Pt did not attend, declined invitation.   Chad Cordial, LCSWA 01/23/2016 9:27 AM

## 2016-01-23 NOTE — Progress Notes (Signed)
Patient stable overnight.  No fever recorded.  Continue Augmentin for total of 7 days.  Outpatient follow up with Endo/surgery as previously scheduled.  Available PRN  Marlin Canary DO [phone]

## 2016-01-23 NOTE — [facility] Group Notes (Signed)
[facility] LCSW Group Therapy 01/23/2016 1:15 PM  Type of Therapy: Group Therapy- Emotion Regulation  Pt arrived to group towards the later portion and did not participate.    Chad Cordial, LCSWA 01/23/2016 4:37 PM

## 2016-01-23 NOTE — Progress Notes (Signed)
D: Pt presents bright on approach. Pt reports decreasing symptoms of depression 5/10, anxiety 5/10, hopeless 5/10. Pt denies suicidal thoughts. Pt reports poor sleep last night. Low energy and concentration level today. Pt stated that he have to make some lifestyle changes once he's discharged home. Pt stated that he will probably have [employer] escort him home and remove some of the people living in his home.  A: Medications reviewed with pt. Medications administered as ordered per MD. Verbal support provided. Pt encouraged to attend groups. 15 minute checks performed for safety. R: Pt stated goal "to work on my discharge plans". Pt receptive to tx.

## 2016-01-23 NOTE — Progress Notes (Signed)
Patient ID: Phillip Kemp, male   DOB: 1961/11/02, 55 y.o.   MRN: [id number] [facility] MD Progress Note  01/23/2016 2:36 PM Phillip Kemp  MRN:  [id number]   Subjective:   Patient reports some ongoing improvement in mood. Describes some ongoing low energy level which he attributes to his chronic medical illness more than to his depression. He is gaining insight into the importance of continuing to follow up with his PCP for ongoing treatment of his hyperthyroidism - he states he had discussed possibility of thyroidectomy with his PCP , and " it may be done in April". Reports some shortness of breath with exertion related to recently diagnosed pneumonia, but no distress or shortness of breath at rest . Tolerating medications well .   Objective: I have discussed case with treatment team and have met with patient. Remains visible in day room, going to some groups, behavior in good control. Spoke about major stressors that have contributed to his anxiety and depression- one contributing factor is that he had allowed a friend to move in to his apartment with the understanding that it was only going to be a few days, but that this person has been staying there for weeks and has not left . He worries because " he sells drugs and I do not want to get into trouble, lose my apartment, and end up homeless ".  States he feels " bad about it", but has decided he is going to need to go to the police and get help with having the person leave his apartment . Denies medication side effects.    Principal Problem: MDD (major depressive disorder), recurrent severe, without psychosis ([place]) Diagnosis:   Patient Active Problem List   Diagnosis Date Noted  . MDD (major depressive disorder), recurrent severe, without psychosis ([place]) [F33.2] 01/18/2016  . Sleep disturbance [G47.9] 07/20/2015  . Major depressive disorder, recurrent, severe without psychotic features ([place]) [F33.2]   . Major depression, recurrent, chronic  ([place]) [F33.9] 06/22/2015  . Alcohol abuse [F10.10] 03/20/2015  . Severe recurrent major depression without psychotic features ([place]) [F33.2] 03/20/2015  . Suicidal ideations [R45.851] 03/20/2015  . Hyperthyroidism [E05.90] 11/25/2014  . Alcohol use disorder, severe, dependence ([place]) [F10.20] 11/22/2014  . Thyroid disease [E07.9] 11/22/2014  . Graves' disease [E05.00] 11/22/2014  . Bipolar 1 disorder, depressed, severe ([place]) [F31.4] 11/21/2014  . Aspiration pneumonia ([place]) [J69.0] 07/15/2013  . Hypokalemia [E87.6] 07/15/2013  . Elevated LFTs [R79.89] 07/15/2013  . Microcytic anemia [D50.9] 07/15/2013  . Thrombocytopenia ([place]) [D69.6] 07/15/2013  . 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 Adrenal nodule, left [E27.9] 07/15/2013  . Chest pain, midsternal [R07.2] 09/24/2012  . Papules [L98.9] 09/10/2012  . Suicidal ideation [R45.851] 09/09/2012  . Chronic blood loss anemia [D50.0] 09/09/2012  . Acute alcoholic pancreatitis [O70.9] 09/08/2012  . Rectal bleeding [K62.5] 09/08/2012  . Cocaine abuse [F14.10] 09/08/2012  . Anemia [D64.9] 09/08/2012  . Left adrenal mass ([place]) [E27.9] 09/08/2012  . Pseudogout of knee [M11.869] 08/27/2011  . Substance abuse [F19.10] 08/27/2011  . Chronic pain [G89.29] 08/27/2011   Total Time spent with patient:  20 minutes   Past Psychiatric History: SEE ABOVE  Past Medical History:  Past Medical History  Diagnosis Date  . Arthritis   . Substance abuse     Cocaine, marijuana, and alcohol  . Suicidal ideation 09/09/2012  . Chronic blood loss anemia 09/09/2012  . Rectal bleeding 09/08/2012  . Acute alcoholic pancreatitis 06/11/3661  . Depression   . Hyperthyroidism   . Gout  Past Surgical History  Procedure Laterality Date  . Orthopedic surgery      Left foot surgery  . Left foot surgery    . Left cataract extraction     Family History:  Family History  Problem Relation Age of Onset  . Heart disease Mother   . Arthritis Mother   . Alzheimer's  disease Mother   . Cancer Father     prostate  . Arthritis Father    Family Psychiatric  History: SEE ABOVE Social History:  History  Alcohol Use No     History  Drug Use  . Yes  . Special: Marijuana, Cocaine    Comment: Former user - no clean for 2 years    Social History   Social History  . Marital Status: Single    Spouse Name: N/A  . Number of Children: 2  . Years of Education: 12   Occupational History  . Teaching laboratory technician.    Social History Main Topics  . Smoking status: Former Smoker -- 0.25 packs/day for 10 years    Types: Cigarettes  . Smokeless tobacco: Never Used  . Alcohol Use: No  . Drug Use: Yes    Special: Marijuana, Cocaine     Comment: Former user - no clean for 2 years  . Sexual Activity: No   Other Topics Concern  . None   Social History Narrative   Widowed.  Wife died of leukemia.  2 children.  Lives in the [place].   Fun: [employer]   Denies religious beliefs effecting health care.    Additional Social History:   Sleep: improved   Appetite:   Improved - weight, BMI have remained stable compared to prior admission last year   Current Medications: Current Facility-Administered Medications  Medication Dose Route Frequency Provider Last Rate Last Dose  . alum & mag hydroxide-simeth (MAALOX/MYLANTA) 200-200-20 MG/5ML suspension 30 mL  30 mL Oral Q4H PRN Phillip Butte, NP      . amoxicillin-clavulanate (AUGMENTIN) 875-125 MG per tablet 1 tablet  1 tablet Oral Q12H Phillip Campus, MD   1 tablet at 01/23/16 0804  . atenolol (TENORMIN) tablet 25 mg  25 mg Oral Daily Phillip Ranga, MD   25 mg at 01/23/16 0805  . feeding supplement (ENSURE ENLIVE) (ENSURE ENLIVE) liquid 237 mL  237 mL Oral TID BM Phillip Center, NP   237 mL at 01/23/16 0943  . ferrous sulfate tablet 325 mg  325 mg Oral Q breakfast Phillip Campus, MD   325 mg at 01/23/16 0804  . folic acid (FOLVITE) tablet 1 mg  1 mg Oral Daily Phillip Ranga, MD   1 mg at 01/23/16 0805  .  hydrOXYzine (ATARAX/VISTARIL) tablet 25 mg  25 mg Oral Q6H PRN Phillip Butte, NP   25 mg at 01/23/16 0021  . ibuprofen (ADVIL,MOTRIN) tablet 600 mg  600 mg Oral Q6H PRN Phillip Butte, NP   600 mg at 01/22/16 1306  . magnesium hydroxide (MILK OF MAGNESIA) suspension 30 mL  30 mL Oral Daily PRN Phillip Butte, NP      . methimazole (TAPAZOLE) tablet 10 mg  10 mg Oral TID Phillip Center, NP   10 mg at 01/23/16 1142  . mirtazapine (REMERON) tablet 15 mg  15 mg Oral QHS Phillip Campus, MD   15 mg at 01/22/16 2127  . multivitamin with minerals tablet 1 tablet  1 tablet Oral Daily Phillip Center, NP  1 tablet at 01/23/16 0805  . thiamine (VITAMIN B-1) tablet 100 mg  100 mg Oral Daily Phillip Ranga, MD   100 mg at 01/23/16 0805    Lab Results:  Results for orders placed or performed during the hospital encounter of 01/18/16 (from the past 48 hour(s))  CBC with Differential/Platelet     Status: Abnormal   Collection Time: 01/22/16  7:20 AM  Result Value Ref Range   WBC 5.0 4.0 - 10.5 K/uL   RBC 3.43 (L) 4.22 - 5.81 MIL/uL   Hemoglobin 9.0 (L) 13.0 - 17.0 g/dL   HCT 27.6 (L) 39.0 - 52.0 %   MCV 80.5 78.0 - 100.0 fL   MCH 26.2 26.0 - 34.0 pg   MCHC 32.6 30.0 - 36.0 g/dL   RDW 16.3 (H) 11.5 - 15.5 %   Platelets 124 (L) 150 - 400 K/uL   Neutrophils Relative % 53 %   Neutro Abs 2.7 1.7 - 7.7 K/uL   Lymphocytes Relative 31 %   Lymphs Abs 1.5 0.7 - 4.0 K/uL   Monocytes Relative 13 %   Monocytes Absolute 0.7 0.1 - 1.0 K/uL   Eosinophils Relative 3 %   Eosinophils Absolute 0.1 0.0 - 0.7 K/uL   Basophils Relative 0 %   Basophils Absolute 0.0 0.0 - 0.1 K/uL    Comment: Performed at [facility]  Comprehensive metabolic panel     Status: Abnormal   Collection Time: 01/22/16  7:20 AM  Result Value Ref Range   Sodium 143 135 - 145 mmol/L   Potassium 4.4 3.5 - 5.1 mmol/L   Chloride 110 101 - 111 mmol/L   CO2 25 22 - 32 mmol/L   Glucose, Bld 95 65 - 99 mg/dL   BUN 24 (H) 6  - 20 mg/dL   Creatinine, Ser 0.56 (L) 0.61 - 1.24 mg/dL   Calcium 9.7 8.9 - 10.3 mg/dL   Total Protein 6.7 6.5 - 8.1 g/dL   Albumin 3.2 (L) 3.5 - 5.0 g/dL   AST 22 15 - 41 U/L   ALT 16 (L) 17 - 63 U/L   Alkaline Phosphatase 140 (H) 38 - 126 U/L   Total Bilirubin 0.4 0.3 - 1.2 mg/dL   GFR calc non Af Amer >60 >60 mL/min   GFR calc Af Amer >60 >60 mL/min    Comment: (NOTE) The eGFR has been calculated using the CKD EPI equation. This calculation has not been validated in all clinical situations. eGFR's persistently <60 mL/min signify possible Chronic Kidney Disease.    Anion gap 8 5 - 15    Comment: Performed at [facility]  T4, free     Status: Abnormal   Collection Time: 01/22/16  7:20 AM  Result Value Ref Range   Free T4 4.16 (H) 0.61 - 1.12 ng/dL    Comment: Performed at [facility]  T3     Status: Abnormal   Collection Time: 01/22/16  7:20 AM  Result Value Ref Range   T3, Total 567 (H) 71 - 180 ng/dL    Comment: (NOTE) Performed At: [facility] [place], [place] [id number] Lindon Romp MD [id number] Performed at [facility]   Magnesium     Status: None   Collection Time: 01/22/16  7:20 AM  Result Value Ref Range   Magnesium 1.8 1.7 - 2.4 mg/dL    Comment: Performed at [place]  Status: None   Collection Time: 01/22/16  7:20 AM  Result Value Ref Range   Phosphorus 4.1 2.5 - 4.6 mg/dL    Comment: Performed at [facility]    Physical Findings: AIMS: Facial and Oral Movements Muscles of Facial Expression: None, normal Lips and Perioral Area: None, normal Jaw: None, normal Tongue: None, normal,Extremity Movements Upper (arms, wrists, hands, fingers): None, normal Lower (legs, knees, ankles, toes): None, normal, Trunk Movements Neck, shoulders, hips: None, normal, Overall Severity Severity of abnormal movements (highest score from  questions above): None, normal Incapacitation due to abnormal movements: None, normal Patient's awareness of abnormal movements (rate only patient's report): No Awareness, Dental Status Current problems with teeth and/or dentures?: No Does patient usually wear dentures?: No  CIWA:  CIWA-Ar Total: 2 COWS:     Musculoskeletal: Strength & Muscle Tone: within normal limits Gait & Station: normal Patient leans: N/A  Psychiatric Specialty Exam: Review of Systems  Psychiatric/Behavioral: Positive for depression. Negative for suicidal ideas. The patient is nervous/anxious. The patient does not have insomnia.   All other systems reviewed and are negative. no SOB at room air, no chest pain at this time, no coughing at present   Blood pressure 131/58, pulse 99, temperature 98 F (36.7 C), temperature source Oral, resp. rate 16, height '6\' 2"'  (1.88 m), weight 117 lb (53.071 kg).Body mass index is 15.02 kg/(m^2).  General Appearance:  Improving grooming  Eye Contact::  Good  Speech:  Clear and Coherent  Volume:  Normal  Mood:   Improving mood  Affect:  Remains  anxious, but improving   Thought Process:   linear  Orientation:  Full (Time, Place, and Person)  Thought Content:  Denies hallucinations, no delusions , not internally preoccupied   Suicidal Thoughts:  No at this time denies suicidal or homicidal ideations, contracts for safety on unit   Homicidal Thoughts:  No  Judgment: improving     Insight:  Fair  Psychomotor Activity:  Normal  Concentration:  Good  Recall:   Good   Fund of Knowledge: good   Language: good   Akathisia:  No  Handed:  Right  AIMS (if indicated):     Assets:  Desire for Improvement Financial Resources/Insurance Housing Resilience  ADL's:  Intact  Cognition: WNL  Sleep:  Number of Hours: 5.75   Assessment - patient presents with gradually improving mood and affect .  Presents future oriented. He does continue to endorse some anxiety and low energy level ,  which may be partially due to hyperthyroidism . Tolerating Remeron well . Tolerating Tapazole well . Presents with improved insight regarding thyroid illness, importance of ongoing outpatient treatment after discharge in order to continue treatment for this condition. Currently being treated for pneumonia .    Treatment Plan Summary: Daily contact with patient to assess and evaluate symptoms and progress in treatment and Medication management  Encourage increased group, milieu participation to work on coping skills and symptom reduction. Continue Tapazole, which he is taking at 10 mgrs TID for history of hyperthyroidism  Increase  Remeron to 30 mgrs QHS for management of depression Continue Ensure  Supplement to address nutritional status . Continue  Augmentin 875 mgrs Q 12 hours x  7 days, for pneumonia treatment . Treatment team working on disposition planning     Neita Garnet, MD 01/23/2016, 2:36 PM

## 2016-01-23 NOTE — Progress Notes (Signed)
Recreation Therapy Notes  Date: 02.08.2017  Time: 9:30am Location: [address] Group Room   Group Topic: Stress Management  Goal Area(s) Addresses:  Patient will actively participate in stress management techniques presented during session.   Behavioral Response: Did not attend.   Kensie Susman L Larrie Fraizer, LRT/CTRS        Alyannah Sanks L 01/23/2016 4:07 PM 

## 2016-01-23 NOTE — Plan of Care (Signed)
Problem: Diagnosis: Increased Risk For Suicide Attempt Goal: LTG-Patient Will Report Improved Mood and Deny Suicidal LTG (by discharge) Patient will report improved mood and deny suicidal ideation.  Outcome: Progressing Pt reports decreased depression and denies suicidal thoughts.

## 2016-01-24 NOTE — [facility] Group Notes (Signed)
[facility] Mental [facility] [employer] Group Therapy 01/24/2016 1:15pm  Type of Therapy: Mental Health Association Presentation  Participation Level: Active  Participation Quality: Attentive  Affect: Appropriate  Cognitive: Oriented  Insight: Developing/Improving  Engagement in Therapy: Engaged  Modes of Intervention: Discussion, Education and Socialization  Summary of Progress/Problems: Mental Health Association (MHA) Speaker came to talk about his personal journey with substance abuse and addiction. The pt processed ways by which to relate to the speaker. MHA speaker provided handouts and educational information pertaining to groups and services offered by the [facility]. Pt was engaged in speaker's presentation and was receptive to resources provided.    Chad Cordial, LCSWA 01/24/2016 1:51 PM

## 2016-01-24 NOTE — Progress Notes (Signed)
Pt reports he had a fairly good day, although he still feels anxious about going home and dealing with the people he has living in his apartment.  He denies SI/HI/AVH.  He feels the medications are working for him.  He is not sure when he will be discharged.  He makes his needs known to staff.  He initially declined to take his Remeron tonight, and went to bed, but did not stay asleep long.  Writer approached him about taking the medication, so he agreed to take the med.  He has been pleasant and cooperative with staff.  He makes his needs known to staff.  Support and encouragement offered.  Discharge plans are in process.  Safety maintained with q15 minute checks.

## 2016-01-24 NOTE — Progress Notes (Signed)
Adult Psychoeducational Group Note  Date:  01/24/2016 Time:  0830  Group Topic/Focus:  Orientation:   The focus of this group is to educate the patient on the purpose and policies of crisis stabilization and provide a format to answer questions about their admission.  The group details unit policies and expectations of patients while admitted.  Participation Level:  Did Not Attend  Participation Quality:    Affect:    Cognitive:    Insight:   Engagement in Group:    Modes of Intervention:    Additional Comments:    Deejay Koppelman L 01/24/2016, 9:07 AM

## 2016-01-24 NOTE — Progress Notes (Signed)
Patient ID: Phillip Kemp, male   DOB: Aug 05, 1961, 55 y.o.   MRN: [id number] [facility] MD Progress Note  01/24/2016 12:22 PM Phillip Kemp  MRN:  [id number]   Subjective:    Tolerating medications well . He states he feels medications are helpful and at this time does not report side effects. Less apprehensive about discharging . Remains ruminative about his stressors, mainly chronic physical illnesses and concerns about a man living in his apartment and refusing to leave. Today states, however, that he feels more hopeful and no longer ambivalent about what to do. States " I am going to tell him that if he has not left by Monday, I am going to have him removed by police ".   Objective: I have discussed case with treatment team and have met with patient. Some group participation- no disruptive or agitated behaviors, calm, polite on approach, interactive with peers . Mood remains somewhat depressed, ruminates about physical illnesses/health issues, but does present with improved  Mood compared to admission, and affect is reactive . Patient complains feeling  " like I have cold sores " on gum line/ mouth, which have caused some pain when eating.  No  Visible oral ulcers noted on inspection.  He had described  " swelling " to staff earlier, but at this time does not endorse any increase in thyroid size . No dysphagia or any shortness of breath. Enlarged thyroid visible, but has decreased in size since his last admission.  Of note, currently on antibiotics for recently diagnosed pneumonia. No fever, no shortness of breath at room air, no coughing at this time. Denies medication side effects. He feels medications are well tolerated and helping. Patient visible on unit, going to groups, interacting appropriately with peers .   Principal Problem: MDD (major depressive disorder), recurrent severe, without psychosis ([place]) Diagnosis:   Patient Active Problem List   Diagnosis Date Noted  . MDD (major  depressive disorder), recurrent severe, without psychosis ([place]) [F33.2] 01/18/2016  . Sleep disturbance [G47.9] 07/20/2015  . Major depressive disorder, recurrent, severe without psychotic features ([place]) [F33.2]   . Major depression, recurrent, chronic ([place]) [F33.9] 06/22/2015  . Alcohol abuse [F10.10] 03/20/2015  . Severe recurrent major depression without psychotic features ([place]) [F33.2] 03/20/2015  . Suicidal ideations [R45.851] 03/20/2015  . Hyperthyroidism [E05.90] 11/25/2014  . Alcohol use disorder, severe, dependence ([place]) [F10.20] 11/22/2014  . Thyroid disease [E07.9] 11/22/2014  . Graves' disease [E05.00] 11/22/2014  . Bipolar 1 disorder, depressed, severe ([place]) [F31.4] 11/21/2014  . Aspiration pneumonia ([place]) [J69.0] 07/15/2013  . Hypokalemia [E87.6] 07/15/2013  . Elevated LFTs [R79.89] 07/15/2013  . Microcytic anemia [D50.9] 07/15/2013  . Thrombocytopenia ([place]) [D69.6] 07/15/2013  . 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 Adrenal nodule, left [E27.9] 07/15/2013  . Chest pain, midsternal [R07.2] 09/24/2012  . Papules [L98.9] 09/10/2012  . Suicidal ideation [R45.851] 09/09/2012  . Chronic blood loss anemia [D50.0] 09/09/2012  . Acute alcoholic pancreatitis [Q67.6] 09/08/2012  . Rectal bleeding [K62.5] 09/08/2012  . Cocaine abuse [F14.10] 09/08/2012  . Anemia [D64.9] 09/08/2012  . Left adrenal mass ([place]) [E27.9] 09/08/2012  . Pseudogout of knee [M11.869] 08/27/2011  . Substance abuse [F19.10] 08/27/2011  . Chronic pain [G89.29] 08/27/2011   Total Time spent with patient:  20 minutes   Past Psychiatric History: SEE ABOVE  Past Medical History:  Past Medical History  Diagnosis Date  . Arthritis   . Substance abuse     Cocaine, marijuana, and alcohol  . Suicidal ideation 09/09/2012  .  Chronic blood loss anemia 09/09/2012  . Rectal bleeding 09/08/2012  . Acute alcoholic pancreatitis 12/17/1115  . Depression   . Hyperthyroidism   . Gout     Past Surgical History  Procedure  Laterality Date  . Orthopedic surgery      Left foot surgery  . Left foot surgery    . Left cataract extraction     Family History:  Family History  Problem Relation Age of Onset  . Heart disease Mother   . Arthritis Mother   . Alzheimer's disease Mother   . Cancer Father     prostate  . Arthritis Father    Family Psychiatric  History: SEE ABOVE Social History:  History  Alcohol Use No     History  Drug Use  . Yes  . Special: Marijuana, Cocaine    Comment: Former user - no clean for 2 years    Social History   Social History  . Marital Status: Single    Spouse Name: N/A  . Number of Children: 2  . Years of Education: 12   Occupational History  . Teaching laboratory technician.    Social History Main Topics  . Smoking status: Former Smoker -- 0.25 packs/day for 10 years    Types: Cigarettes  . Smokeless tobacco: Never Used  . Alcohol Use: No  . Drug Use: Yes    Special: Marijuana, Cocaine     Comment: Former user - no clean for 2 years  . Sexual Activity: No   Other Topics Concern  . None   Social History Narrative   Widowed.  Wife died of leukemia.  2 children.  Lives in the [place].   Fun: [employer]   Denies religious beliefs effecting health care.    Additional Social History:   Sleep: improved   Appetite:   Improved - weight, BMI have remained stable compared to prior admission last year   Current Medications: Current Facility-Administered Medications  Medication Dose Route Frequency Provider Last Rate Last Dose  . alum & mag hydroxide-simeth (MAALOX/MYLANTA) 200-200-20 MG/5ML suspension 30 mL  30 mL Oral Q4H PRN Harriet Butte, NP      . amoxicillin-clavulanate (AUGMENTIN) 875-125 MG per tablet 1 tablet  1 tablet Oral Q12H Jenne Campus, MD   1 tablet at 01/24/16 0746  . atenolol (TENORMIN) tablet 25 mg  25 mg Oral Daily Simbiso Ranga, MD   25 mg at 01/24/16 0746  . feeding supplement (ENSURE ENLIVE) (ENSURE ENLIVE) liquid 237 mL  237 mL Oral TID BM  Derrill Center, NP   237 mL at 01/24/16 0955  . ferrous sulfate tablet 325 mg  325 mg Oral Q breakfast Jenne Campus, MD   325 mg at 01/24/16 0746  . folic acid (FOLVITE) tablet 1 mg  1 mg Oral Daily Simbiso Ranga, MD   1 mg at 01/24/16 0746  . hydrOXYzine (ATARAX/VISTARIL) tablet 25 mg  25 mg Oral Q6H PRN Harriet Butte, NP   25 mg at 01/23/16 0021  . ibuprofen (ADVIL,MOTRIN) tablet 600 mg  600 mg Oral Q6H PRN Harriet Butte, NP   600 mg at 01/22/16 1306  . magnesium hydroxide (MILK OF MAGNESIA) suspension 30 mL  30 mL Oral Daily PRN Harriet Butte, NP      . methimazole (TAPAZOLE) tablet 10 mg  10 mg Oral TID Derrill Center, NP   10 mg at 01/24/16 0746  . mirtazapine (REMERON) tablet 30 mg  30 mg Oral  QHS Jenne Campus, MD   30 mg at 01/23/16 2203  . multivitamin with minerals tablet 1 tablet  1 tablet Oral Daily Derrill Center, NP   1 tablet at 01/24/16 0746  . thiamine (VITAMIN B-1) tablet 100 mg  100 mg Oral Daily Simbiso Ranga, MD   100 mg at 01/24/16 0746    Lab Results:  No results found for this or any previous visit (from the past 48 hour(s)).  Physical Findings: AIMS: Facial and Oral Movements Muscles of Facial Expression: None, normal Lips and Perioral Area: None, normal Jaw: None, normal Tongue: None, normal,Extremity Movements Upper (arms, wrists, hands, fingers): None, normal Lower (legs, knees, ankles, toes): None, normal, Trunk Movements Neck, shoulders, hips: None, normal, Overall Severity Severity of abnormal movements (highest score from questions above): None, normal Incapacitation due to abnormal movements: None, normal Patient's awareness of abnormal movements (rate only patient's report): No Awareness, Dental Status Current problems with teeth and/or dentures?: No Does patient usually wear dentures?: No  CIWA:  CIWA-Ar Total: 2 COWS:     Musculoskeletal: Strength & Muscle Tone: within normal limits Gait & Station: normal Patient leans:  N/A  Psychiatric Specialty Exam: Review of Systems  Psychiatric/Behavioral: Positive for depression. Negative for suicidal ideas. The patient is nervous/anxious. The patient does not have insomnia.   All other systems reviewed and are negative. no SOB at room air, no chest pain at this time, no coughing at present   Blood pressure 115/69, pulse 102, temperature 98.1 F (36.7 C), temperature source Oral, resp. rate 16, height _0  (1.88 m), weight 117 lb (53.071 kg).Body mass index is 15.02 kg/(m^2).  General Appearance:  Improving grooming  Eye Contact::  Good  Speech:  Clear and Coherent  Volume:  Normal  Mood:   Improved compared to admission  Affect:  Reactive, remains anxious, ruminative   Thought Process:   linear  Orientation:  Full (Time, Place, and Person)  Thought Content:  Denies hallucinations, no delusions , not internally preoccupied   Suicidal Thoughts:  No at this time denies suicidal or homicidal ideations, contracts for safety on unit   Homicidal Thoughts:  No denies any violent or homicidal ideations  Judgment: improving     Insight:  Improving   Psychomotor Activity:  Normal  Concentration:  Good  Recall:   Good   Fund of Knowledge: good   Language: good   Akathisia:  No  Handed:  Right  AIMS (if indicated):     Assets:  Desire for Improvement Financial Resources/Insurance Housing Resilience  ADL's:  Intact  Cognition: WNL  Sleep:  Number of Hours: 6.5   Assessment -partial improvement of mood and affect - remains apprehensive, ruminative about stressors, but less overwhelmed by them and better able to discuss solutions or ways to address these . At this time continues antibiotics for pneumonia- no SOB, no coughing , no fever , no chills. He  Reports some oral discomfort which he feels may be related to aphthous lesion in mouth. Tolerating medications well .    Treatment Plan Summary: Daily contact with patient to assess and evaluate symptoms and  progress in treatment and Medication management  Encourage increased group, milieu participation to work on coping skills and symptom reduction. Continue Tapazole, which he is taking at 10 mgrs TID for history of hyperthyroidism  Continue  Remeron to 30 mgrs QHS for management of depression Continue Ensure  Supplement to address nutritional status . Continue  Augmentin 875 mgrs Q  12 hours x  7 days, for pneumonia treatment . Treatment team working on disposition Barber, [place], MD 01/24/2016, 12:22 PM

## 2016-01-24 NOTE — Progress Notes (Signed)
D: Pt presents with flat affect and depressed mood. Pt appeared sad during shift assessment. Pt rates depression 6/10. Anxiety 6/10. Hopeless 6/10. Pt reports fair sleep. Good appetite and concentration level. Pt denies suicidal thoughts. Pt concerned about physical health. Pt c/o swollen glands that started last night. Pt reported that he was able to swallow morning meds. Pt consumed meds w/o difficulty. Writer reassessed pt later this morning and pt stated that his throat is starting to feel less swollen. Pt noted to have goutier due to hx of hyperthyroidism.  A: Medications reviewed with pt. Medications administered as ordered per MD. Verbal support provided. Pt encouraged to attend groups. Writer will inform Dr. Jama Flavors of pt complaint of swollen glands. 15 minute checks performed for safety. R: Pt receptive to tx.

## 2016-01-24 NOTE — Progress Notes (Signed)
Adult Psychoeducational Group Note  Date:  01/24/2016 Time:  9:40 PM  Group Topic/Focus:  Wrap-Up Group:   The focus of this group is to help patients review their daily goal of treatment and discuss progress on daily workbooks.  Participation Level:  Active  Participation Quality:  Attentive  Affect:  Appropriate  Cognitive:  Alert  Insight: Appropriate  Engagement in Group:  Engaged  Modes of Intervention:  Problem-solving  Additional Comments:  Leeum shared with the group today was a great day and he is looking forward being discharge on the following day.  He stated he has a supportive system in the community.  Annell Greening [place] 01/24/2016, 9:40 PM

## 2016-01-25 MED ORDER — HYDROXYZINE HCL 25 MG PO TABS: 25.0000 mg | ORAL_TABLET | Freq: Four times a day (QID) | ORAL | Status: DC | PRN

## 2016-01-25 MED ORDER — MIRTAZAPINE 30 MG PO TABS: 30.0000 mg | ORAL_TABLET | Freq: Every day | ORAL | Status: DC

## 2016-01-25 MED ORDER — AMOXICILLIN-POT CLAVULANATE 875-125 MG PO TABS: 1.0000 | ORAL_TABLET | Freq: Two times a day (BID) | ORAL | Status: DC

## 2016-01-25 MED ORDER — METHIMAZOLE 10 MG PO TABS: 10.0000 mg | ORAL_TABLET | Freq: Three times a day (TID) | ORAL | Status: DC

## 2016-01-25 MED ORDER — ATENOLOL 25 MG PO TABS: 25.0000 mg | ORAL_TABLET | Freq: Every day | ORAL | Status: DC

## 2016-01-25 NOTE — BHH Suicide Risk Assessment (Addendum)
[facility] Discharge Suicide Risk Assessment   Principal Problem: MDD (major depressive disorder), recurrent severe, without psychosis (HCC) Discharge Diagnoses:  Patient Active Problem List   Diagnosis Date Noted  . MDD (major depressive disorder), recurrent severe, without psychosis (HCC) [F33.2] 01/18/2016  . Sleep disturbance [G47.9] 07/20/2015  . Major depressive disorder, recurrent, severe without psychotic features (HCC) [F33.2]   . Major depression, recurrent, chronic (HCC) [F33.9] 06/22/2015  . Alcohol abuse [F10.10] 03/20/2015  . Severe recurrent major depression without psychotic features (HCC) [F33.2] 03/20/2015  . Suicidal ideations [R45.851] 03/20/2015  . Hyperthyroidism [E05.90] 11/25/2014  . Alcohol use disorder, severe, dependence (HCC) [F10.20] 11/22/2014  . Thyroid disease [E07.9] 11/22/2014  . Graves' disease [E05.00] 11/22/2014  . Bipolar 1 disorder, depressed, severe (HCC) [F31.4] 11/21/2014  . Aspiration pneumonia (HCC) [J69.0] 07/15/2013  . Hypokalemia [E87.6] 07/15/2013  . Elevated LFTs [R79.89] 07/15/2013  . Microcytic anemia [D50.9] 07/15/2013  . Thrombocytopenia (HCC) [D69.6] 07/15/2013  . 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 Adrenal nodule, left [E27.9] 07/15/2013  . Chest pain, midsternal [R07.2] 09/24/2012  . Papules [L98.9] 09/10/2012  . Suicidal ideation [R45.851] 09/09/2012  . Chronic blood loss anemia [D50.0] 09/09/2012  . Acute alcoholic pancreatitis [K85.2] 16/09/9603  . Rectal bleeding [K62.5] 09/08/2012  . Cocaine abuse [F14.10] 09/08/2012  . Anemia [D64.9] 09/08/2012  . Left adrenal mass (HCC) [E27.9] 09/08/2012  . Pseudogout of knee [M11.869] 08/27/2011  . Substance abuse [F19.10] 08/27/2011  . Chronic pain [G89.29] 08/27/2011    Total Time spent with patient: 30 minutes  Musculoskeletal: Strength & Muscle Tone: within normal limits Gait & Station: normal Patient leans: N/A  Psychiatric Specialty Exam: ROS  Blood pressure 114/65, pulse  90, temperature 98.2 F (36.8 C), temperature source Oral, resp. rate 16, height  (1.88 m), weight 117 lb (53.071 kg).Body mass index is 15.02 kg/(m^2).  General Appearance: improved grooming   Eye Contact::  Good  Speech:  Normal Rate409  Volume:  Normal  Mood:  improved, euthymic today  Affect:  Appropriate- more reactive   Thought Process:  Goal Directed  Orientation:  Full (Time, Place, and Person)  Thought Content:  no hallucinations, no delusions   Suicidal Thoughts:  No- denies any suicidal ideations, denies any self injurious ideations   Homicidal Thoughts:  No denies any violent or homicidal ideations  Memory:  recent and remote grossly intact   Judgement:  Other:  improved   Insight:  Present  Psychomotor Activity:  Normal  Concentration:  Good  Recall:  Good  Fund of Knowledge:Good  Language: Good  Akathisia:  Negative  Handed:  Right  AIMS (if indicated):     Assets:  Communication Skills Desire for Improvement Resilience  Sleep:  Number of Hours: 6.75  Cognition: WNL  ADL's:  Intact   Mental Status Per Nursing Assessment::   On Admission:  Suicidal ideation indicated by others  Demographic Factors:  55 year old man, lives alone- a recent stressor is that a friend has been staying at home and had been refusing to leave   Loss Factors: Difficulty having friend leave his apartment , chronic medical illness - hyperthyroidism  Historical Factors: Prior psychiatric admissions for depression- history of hyperthyroidism   Risk Reduction Factors:   Positive coping skills or problem solving skills  Continued Clinical Symptoms:  At this time patient presenting with improved mood, improved range of affect, improved grooming, good eye contact, no thought disorder, no SI , no HI, no psychotic symptoms, future oriented. Tolerating medications well, denies  medication side effects. More future oriented and optimistic, regarding issue with friend, states he has asked  him to leave and if he does not, he  will  Then go to police to ask for help in having friend leave .  Cognitive Features That Contribute To Risk:  No gross cognitive deficits noted upon discharge. Is alert , attentive, and oriented x 3   Suicide Risk:  Mild   Follow-up Information    Follow up with [facility].   Specialty:  Behavioral Health   Why:  Walk-In Clinic is open 8am-3pm Monday-Friday to get reestablished with medication management and therapy with Philis Nettle.     [address] [place] [address] [phone]       Follow up with [facility] On 02/18/2016.   Why:  at 12:45pm for medication management and 2:00pm for therapy.   Contact information:   [address] [place] [address] [phone] Fax: [phone]      Follow up with [facility].   Why:  at 9:30 AM FOR HOSPITAL/ MEDICAL FOLLOW UP   Contact information:   [address] [place] [address] [phone]      Plan Of Care/Follow-up recommendations:  Activity:  as tolerated  Diet:  Regular Tests:  NA Other:  See below  Patient is leaving unit in good spirits. Plans to return home. Follow up as above. Plans to follow up with his PCP , Dr. Delton Coombes at [facility], for management of hyperthyroidism . Also plans to complete antibiotic course for recently diagnosed pneumonia . Nehemiah Massed, MD 01/25/2016, 2:07 PM

## 2016-01-25 NOTE — Progress Notes (Signed)
Pt reports he had a good day.  He denies SI/HI/AVH.  He says that he is ready for discharge and may be discharged on Friday.  His main concern is dealing with the man who is staying in his apartment.  He says he is ready to call the police if that is what it takes to get the man out of his home.  He did not want the Remeron tonight as he said it did not help him last night.  He reports that he got up to go to the bathroom several times last night.  He has been in the dayroom most of the evening watching TV and talking with peers.  He is polite and appropriate on the unit.  Pt makes his needs known to staff.  Support and encouragement offered.  Safety maintained with q15 minute checks.

## 2016-01-25 NOTE — Progress Notes (Signed)
  [facility] Adult Case Management Discharge Plan :  Will you be returning to the same living situation after discharge:  Yes,  Pt returning home At discharge, do you have transportation home?: Yes,  Pt transported back to [facility] ED where his car is Do you have the ability to pay for your medications: Yes,  Pt provided with prescriptions  Release of information consent forms completed and in the chart;  Patient's signature needed at discharge.  Patient to Follow up at: Follow-up Information    Follow up with [facility].   Specialty:  Behavioral Health   Why:  Walk-In Clinic is open 8am-3pm Monday-Friday to get reestablished with medication management and therapy with Philis Nettle.     [address] [place] [address] [phone]       Follow up with [facility] On 02/18/2016.   Why:  at 12:45pm for medication management and 2:00pm for therapy.   Contact information:   [address] [place] [address] [phone] Fax: [phone]      Follow up with [facility].   Why:  at 9:30 AM FOR HOSPITAL/ MEDICAL FOLLOW UP   Contact information:   [address] [place] [address] [phone]      Next level of care provider has access to [facility] Link:no  Safety Planning and Suicide Prevention discussed: Yes,  with Pt; 2 unsuccessful contact attempts made with sister  Have you used any form of tobacco in the last 30 days? (Cigarettes, Smokeless Tobacco, Cigars, and/or Pipes): No  Has patient been referred to the Quitline?: N/A patient is not a smoker  Patient has been referred for addiction treatment: Yes- see above  Elaina Hoops 01/25/2016, 12:42 PM

## 2016-01-25 NOTE — Discharge Summary (Signed)
Physician Discharge Summary Note  Patient:  Phillip Kemp is an 55 y.o., male MRN:  [id number] DOB:  17-May-1961 Patient phone:  [phone] (home)  Patient address:   [address] [place] [address],  Total Time spent with patient: 45 minutes  Date of Admission:  01/18/2016 Date of Discharge: 01/25/2016  Reason for Admission:  depression  Principal Problem: MDD (major depressive disorder), recurrent severe, without psychosis [facility]) Discharge Diagnoses: Patient Active Problem List   Diagnosis Date Noted  . MDD (major depressive disorder), recurrent severe, without psychosis (HCC) [F33.2] 01/18/2016  . Sleep disturbance [G47.9] 07/20/2015  . Major depressive disorder, recurrent, severe without psychotic features (HCC) [F33.2]   . Major depression, recurrent, chronic (HCC) [F33.9] 06/22/2015  . Alcohol abuse [F10.10] 03/20/2015  . Severe recurrent major depression without psychotic features (HCC) [F33.2] 03/20/2015  . Suicidal ideations [R45.851] 03/20/2015  . Hyperthyroidism [E05.90] 11/25/2014  . Alcohol use disorder, severe, dependence (HCC) [F10.20] 11/22/2014  . Thyroid disease [E07.9] 11/22/2014  . Graves' disease [E05.00] 11/22/2014  . Bipolar 1 disorder, depressed, severe (HCC) [F31.4] 11/21/2014  . Aspiration pneumonia (HCC) [J69.0] 07/15/2013  . Hypokalemia [E87.6] 07/15/2013  . Elevated LFTs [R79.89] 07/15/2013  . Microcytic anemia [D50.9] 07/15/2013  . Thrombocytopenia (HCC) [D69.6] 07/15/2013  . 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 Adrenal nodule, left [E27.9] 07/15/2013  . Chest pain, midsternal [R07.2] 09/24/2012  . Papules [L98.9] 09/10/2012  . Suicidal ideation [R45.851] 09/09/2012  . Chronic blood loss anemia [D50.0] 09/09/2012  . Acute alcoholic pancreatitis [K85.2] 21/30/8657  . Rectal bleeding [K62.5] 09/08/2012  . Cocaine abuse [F14.10] 09/08/2012  . Anemia [D64.9] 09/08/2012  . Left adrenal mass (HCC) [E27.9] 09/08/2012  . Pseudogout of knee  [M11.869] 08/27/2011  . Substance abuse [F19.10] 08/27/2011  . Chronic pain [G89.29] 08/27/2011   Past Psychiatric History:  MDD (major depressive disorder), recurrent severe, without psychosis  Past Medical History:  Past Medical History  Diagnosis Date  . Arthritis   . Substance abuse     Cocaine, marijuana, and alcohol  . Suicidal ideation 09/09/2012  . Chronic blood loss anemia 09/09/2012  . Rectal bleeding 09/08/2012  . Acute alcoholic pancreatitis 09/08/2012  . Depression   . Hyperthyroidism   . Gout     Past Surgical History  Procedure Laterality Date  . Orthopedic surgery      Left foot surgery  . Left foot surgery    . Left cataract extraction     Family History:  Family History  Problem Relation Age of Onset  . Heart disease Mother   . Arthritis Mother   . Alzheimer's disease Mother   . Cancer Father     prostate  . Arthritis Father    Family Psychiatric  History:  Denied Social History:  History  Alcohol Use No     History  Drug Use  . Yes  . Special: Marijuana, Cocaine    Comment: Former user - no clean for 2 years    Social History   Social History  . Marital Status: Single    Spouse Name: N/A  . Number of Children: 2  . Years of Education: 12   Occupational History  . Quarry manager.    Social History Main Topics  . Smoking status: Former Smoker -- 0.25 packs/day for 10 years    Types: Cigarettes  . Smokeless tobacco: Never Used  . Alcohol Use: No  . Drug Use: Yes    Special: Marijuana, Cocaine     Comment: Former  user - no clean for 2 years  . Sexual Activity: No   Other Topics Concern  . None   Social History Narrative   Widowed.  Wife died of leukemia.  2 children.  [place].   Fun: [employer]   Denies religious beliefs effecting health care.     Hospital Course:  [name] is an 55 y.o. male, widowed, African-American who present unaccompanied to [facility] ED reporting symptoms of depression including  suicidal ideation. Pt has a history of depression and states he has been off antidepressant medications for the past seven months because [facility] would not accept his insurance.  [name] was admitted for MDD (major depressive disorder), recurrent severe, without psychosis (HCC) and crisis management.  He was treated with the following medications listed below.  [name] was discharged with current medication and was instructed on how to take medications as prescribed; (details listed below under Medication List).  Medical problems were identified and treated as needed.  Home medications were restarted as appropriate.  Improvement was monitored by observation and Gwendalyn Ege daily report of symptom reduction.  Emotional and mental status was monitored by daily self-inventory reports completed by Gwendalyn Ege and clinical staff.         [name] was evaluated by the treatment team for stability and plans for continued recovery upon discharge.  [name] motivation was an integral factor for scheduling further treatment.  Employment, transportation, bed availability, health status, family support, and any pending legal issues were also considered during his hospital stay.  He was offered further treatment options upon discharge including but not limited to Residential, Intensive Outpatient, and Outpatient treatment.  [name] will follow up with the services as listed below under Follow Up Information.     Upon completion of this admission the [name] was both mentally and medically stable for discharge denying suicidal/homicidal ideation, auditory/visual/tactile hallucinations, delusional thoughts and paranoia.     Physical Findings: AIMS: Facial and Oral Movements Muscles of Facial Expression: None, normal Lips and Perioral Area: None, normal Jaw: None, normal Tongue: None, normal,Extremity Movements Upper (arms, wrists, hands, fingers): None,  normal Lower (legs, knees, ankles, toes): None, normal, Trunk Movements Neck, shoulders, hips: None, normal, Overall Severity Severity of abnormal movements (highest score from questions above): None, normal Incapacitation due to abnormal movements: None, normal Patient's awareness of abnormal movements (rate only patient's report): No Awareness, Dental Status Current problems with teeth and/or dentures?: No Does patient usually wear dentures?: No  CIWA:  CIWA-Ar Total: 2 COWS:     Musculoskeletal: Strength & Muscle Tone: within normal limits Gait & Station: normal Patient leans: N/A  Psychiatric Specialty Exam:  SEE MD SRA Review of Systems  All other systems reviewed and are negative.   Blood pressure 114/65, pulse 90, temperature 98.2 F (36.8 C), temperature source Oral, resp. rate 16, height 6\' 2"  (1.88 m), weight 53.071 kg (117 lb).Body mass index is 15.02 kg/(m^2).  Have you used any form of tobacco in the last 30 days? (Cigarettes, Smokeless Tobacco, Cigars, and/or Pipes): No  Has this patient used any form of tobacco in the last 30 days? (Cigarettes, Smokeless Tobacco, Cigars, and/or Pipes) Yes, refused  Metabolic Disorder Labs:  Lab Results  Component Value Date   HGBA1C 5.0 11/22/2014   MPG 97 11/22/2014   No results found for: PROLACTIN Lab Results  Component Value Date   CHOL 104 11/22/2014  TRIG 63 11/22/2014   HDL 61 11/22/2014   CHOLHDL 1.7 11/22/2014   VLDL 13 11/22/2014   LDLCALC 30 11/22/2014    See Psychiatric Specialty Exam and Suicide Risk Assessment completed by Attending Physician prior to discharge.  Discharge destination:  Home  Is patient on multiple antipsychotic therapies at discharge:  No   Has Patient had three or more failed trials of antipsychotic monotherapy by history:  No  Recommended Plan for Multiple Antipsychotic Therapies: NA     Medication List    STOP taking these medications        ENSURE     multivitamin Tabs  tablet     traZODone 100 MG tablet  Commonly known as:  DESYREL      TAKE these medications      Indication   amoxicillin-clavulanate 875-125 MG tablet  Commonly known as:  AUGMENTIN  Take 1 tablet by mouth every 12 (twelve) hours.   Indication:  Pneumonia     atenolol 25 MG tablet  Commonly known as:  TENORMIN  Take 1 tablet (25 mg total) by mouth daily.   Indication:  High Blood Pressure     hydrOXYzine 25 MG tablet  Commonly known as:  ATARAX/VISTARIL  Take 1 tablet (25 mg total) by mouth every 6 (six) hours as needed for anxiety (sleep).   Indication:  Anxiety Neurosis     methimazole 10 MG tablet  Commonly known as:  TAPAZOLE  Take 1 tablet (10 mg total) by mouth 3 (three) times daily.   Indication:  Overactive Thyroid Gland     mirtazapine 30 MG tablet  Commonly known as:  REMERON  Take 1 tablet (30 mg total) by mouth at bedtime.   Indication:  Major Depressive Disorder       Follow-up Information    Follow up with [facility].   Specialty:  Behavioral Health   Why:  Walk-In Clinic is open 8am-3pm Monday-Friday to get reestablished with medication management and therapy with Philis Nettle.     [address] [place] [address] [phone]       Follow up with [facility] On 02/18/2016.   Why:  at 12:45pm for medication management and 2:00pm for therapy.   Contact information:   [address] [place] [address] [phone] Fax: [phone]      Follow-up recommendations:  Activity:  as tol Diet:  as tol  Comments:  1.  Take all your medications as prescribed.              2.  Report any adverse side effects to outpatient provider.                       3.  Patient instructed to not use alcohol or illegal drugs while on prescription medicines.            4.  In the event of worsening symptoms, instructed patient to call 911, the crisis hotline or go to nearest emergency room for evaluation of  symptoms.  Signed: Lindwood Qua, NP [facility] 01/25/2016, 10:40 AM  Patient seen, Suicide Assessment Completed.  Disposition Plan Reviewed

## 2016-01-25 NOTE — Tx Team (Signed)
Interdisciplinary Treatment Plan Update (Adult) Date: 01/25/2016   Date: 01/25/2016 10:07 AM  Progress in Treatment:  Attending groups: Yes  Participating in groups: Yes  Taking medication as prescribed: Yes  Tolerating medication: Yes  Family/Significant othe contact made: No, CSW attempted to make contact with sister but attempts were unsuccessful Patient understands diagnosis: Yes AEB seeking help with depression Discussing patient identified problems/goals with staff: Yes  Medical problems stabilized or resolved: Yes  Denies suicidal/homicidal ideation: Yes Patient has not harmed self or Others: Yes   New problem(s) identified: None identified at this time.   Discharge Plan or Barriers: Pt will return home and follow-up with outpatient resources  Additional comments:  Patient and CSW reviewed pt's identified goals and treatment plan. Patient verbalized understanding and agreed to treatment plan. CSW reviewed [facility] "Discharge Process and Patient Involvement" Form. Pt verbalized understanding of information provided and signed form.   Reason for Continuation of Hospitalization:  Anxiety Depression Medication stabilization Suicidal ideation  Estimated length of stay: 0 days  Review of initial/current patient goals per problem list:   1.  Goal(s): Patient will participate in aftercare plan  Met:  Yes  Target date: 3-5 days from date of admission   As evidenced by: Patient will participate within aftercare plan AEB aftercare provider and housing plan at discharge being identified.   01/21/16: Pt will return home and follow-up with outpatient resources  2.  Goal (s): Patient will exhibit decreased depressive symptoms and suicidal ideations.  Met:  Adequate for DC  Target date: 3-5 days from date of admission   As evidenced by: Patient will utilize self rating of depression at 3 or below and demonstrate decreased signs of depression or be deemed stable for discharge by  MD.  01/21/16: Pt rates depression at 8/10 and endorses SI  01/25/16: MD feels that Pt's symptoms have decreased to the point that they can be managed in an outpatient setting.   3.  Goal(s): Patient will demonstrate decreased signs and symptoms of anxiety.  Met:  Adequate for DC  Target date: 3-5 days from date of admission   As evidenced by: Patient will utilize self rating of anxiety at 3 or below and demonstrated decreased signs of anxiety, or be deemed stable for discharge by MD  01/21/16: Pt rates anxiety at 9/10  01/25/16: MD feels that Pt's symptoms have decreased to the point that they can be managed in an outpatient setting.   Attendees:  Patient:    Family:    Physician: Dr. Parke Poisson, MD  01/25/2016 10:07 AM  Nursing: Lars Pinks, RN Case manager  01/25/2016 10:07 AM  Clinical Social Worker Peri Maris, [place] 01/25/2016 10:07 AM  Other: Erasmo Downer Drinkard, [place] 01/25/2016 10:07 AM  Clinical:  Marnee Guarneri, RN; Prudencio Pair, RN 01/25/2016 10:07 AM  Other: , RN Charge Nurse 01/25/2016 10:07 AM  Other: Hilda Lias, [place], [place] Social [facility] [phone]

## 2016-01-25 NOTE — Progress Notes (Signed)
Pt verbalizes anxiety about going home, "I have a lot to deal with when I get there.  I am hoping that someone who I was helping is gone.  I don't want any trouble."  Pt had written that he is having SI on his Self Inventory.  This RN spoke 1:1 with pt, he denies SI to this RN, "I was just nervous when I wrote that.  I don't plan on hurting myself.'  Pt discharged with Security that is to drive him to his car parked at [facility].  Pt was stable and appreciative at time of discharge.  All discharge paperwork including transitional record and Suicide risk assessment handed to the pt , prescriptions were given and belongings returned.  Verbal understanding expressed, pt given opportunity to express concerns and ask questions.  Denies SI and A/V hallucinations.

## 2016-02-01 ENCOUNTER — Inpatient Hospital Stay: Payer: Self-pay

## 2016-03-06 ENCOUNTER — Ambulatory Visit: Payer: Self-pay | Admitting: Family Medicine

## 2016-03-21 ENCOUNTER — Telehealth: Payer: Self-pay | Admitting: Family

## 2016-03-21 MED ORDER — METHIMAZOLE 10 MG PO TABS: 10.0000 mg | ORAL_TABLET | Freq: Three times a day (TID) | ORAL | Status: DC

## 2016-03-21 NOTE — Telephone Encounter (Signed)
Rx faxed

## 2016-03-21 NOTE — Telephone Encounter (Signed)
Medication refilled. OV for additional refills.

## 2016-03-21 NOTE — Telephone Encounter (Signed)
Tried to call pt, both # does not work.

## 2016-03-21 NOTE — Telephone Encounter (Signed)
Pt request refill for methimazole (TAPAZOLE) 10 MG tablet to be call into [facility] on [address] in [place]. Please help, he really need this refill

## 2016-04-17 ENCOUNTER — Ambulatory Visit (INDEPENDENT_AMBULATORY_CARE_PROVIDER_SITE_OTHER): Payer: Medicare Other | Admitting: Endocrinology

## 2016-04-17 ENCOUNTER — Encounter: Payer: Self-pay | Admitting: Endocrinology

## 2016-04-17 VITALS — BP 122/64 | HR 100 | Temp 98.7°F | Ht 73.5 in | Wt 130.0 lb

## 2016-04-17 DIAGNOSIS — E0590 Thyrotoxicosis, unspecified without thyrotoxic crisis or storm: Secondary | ICD-10-CM | POA: Diagnosis not present

## 2016-04-17 DIAGNOSIS — E0500 Thyrotoxicosis with diffuse goiter without thyrotoxic crisis or storm: Secondary | ICD-10-CM

## 2016-04-17 DIAGNOSIS — D649 Anemia, unspecified: Secondary | ICD-10-CM

## 2016-04-17 LAB — CBC
HEMATOCRIT: 32.4 % — AB (ref 39.0–52.0)
HEMOGLOBIN: 10.9 g/dL — AB (ref 13.0–17.0)
MCHC: 33.5 g/dL (ref 30.0–36.0)
MCV: 77.7 fl — AB (ref 78.0–100.0)
PLATELETS: 192 10*3/uL (ref 150.0–400.0)
RBC: 4.17 Mil/uL — AB (ref 4.22–5.81)
RDW: 15.7 % — ABNORMAL HIGH (ref 11.5–15.5)
WBC: 7.3 10*3/uL (ref 4.0–10.5)

## 2016-04-17 LAB — IBC PANEL
Iron: 28 ug/dL — ABNORMAL LOW (ref 42–165)
Saturation Ratios: 11.2 % — ABNORMAL LOW (ref 20.0–50.0)
TRANSFERRIN: 179 mg/dL — AB (ref 212.0–360.0)

## 2016-04-17 LAB — T3, FREE: T3, Free: 23.8 pg/mL — ABNORMAL HIGH (ref 2.3–4.2)

## 2016-04-17 LAB — VITAMIN B12: Vitamin B-12: 349 pg/mL (ref 211–911)

## 2016-04-17 LAB — T4, FREE: Free T4: 3.89 ng/dL — ABNORMAL HIGH (ref 0.60–1.60)

## 2016-04-17 MED ORDER — ATENOLOL 25 MG PO TABS: 25.0000 mg | ORAL_TABLET | Freq: Every day | ORAL | Status: DC

## 2016-04-17 NOTE — Progress Notes (Signed)
Patient ID: Phillip Kemp, male   DOB: Feb 26, 1961, 55 y.o.   MRN: [id number]                                                                                                               Reason for Appointment:  Hyperthyroidism, new consultation  Referring physician:   [place]   History of Present Illness:     In 2013 patient was apparently losing weight, had lost about 50-60 pounds despite a good appetite.  His family also noticed that he had swelling in his neck. He was admitted to the hospital in 09/2012 and found to have tachycardia and hyperthyroidism  He was treated with methimazole but apparently did not have any regular follow-up subsequently  He has been sporadically seen  by various physicians hyperthyroidism with has not been regular with follow-up He thinks he has been mostly treated with 5 mg twice a day of methimazole  Over the last few years he continues to have difficulties with not gaining weight, having palpitations and shakiness at times, feeling weak, short of breath on exertion and moodiness He does not usually have excessive problems with heat intolerance or sweating and usually not having palpitations He will also get diarrhea if his thyroid levels are not controlled or he is not taking medication   He was told to increase in methimazole to 10 mg 3 times a day in 2/16 but has not had a follow-up since then   Wt Readings from Last 3 Encounters:  04/17/16 130 lb (58.968 kg)  01/19/16 117 lb (53.071 kg)  01/18/16 120 lb (54.432 kg)     He has had periodic thyroid function tests and they showed the following:     Lab Results  Component Value Date   FREET4 4.16* 01/22/2016   FREET4 7.55* 11/22/2014   FREET4 4.30* 07/17/2013   TSH 0.010* 01/19/2016   TSH 0.06* 07/20/2015   TSH <0.010* 06/21/2015    Treatments so far:     Medication List       This list is accurate as of: 04/17/16  3:39 PM.  Always use your most recent med list.               atenolol 25 MG tablet  Commonly known as:  TENORMIN  Take 1 tablet (25 mg total) by mouth daily.     hydrOXYzine 25 MG tablet  Commonly known as:  ATARAX/VISTARIL  Take 1 tablet (25 mg total) by mouth every 6 (six) hours as needed for anxiety (sleep).     methimazole 10 MG tablet  Commonly known as:  TAPAZOLE  Take 1 tablet (10 mg total) by mouth 3 (three) times daily.     mirtazapine 30 MG tablet  Commonly known as:  REMERON  Take 1 tablet (30 mg total) by mouth at bedtime.             Past Medical History  Diagnosis Date  . Arthritis   . Substance abuse     Cocaine, marijuana, and  alcohol  . Suicidal ideation 09/09/2012  . Chronic blood loss anemia 09/09/2012  . Rectal bleeding 09/08/2012  . Acute alcoholic pancreatitis 09/08/2012  . Depression   . Hyperthyroidism   . Gout     Past Surgical History  Procedure Laterality Date  . Orthopedic surgery      Left foot surgery  . Left foot surgery    . Left cataract extraction      Family History  Problem Relation Age of Onset  . Heart disease Mother   . Arthritis Mother   . Alzheimer's disease Mother   . Cancer Father     prostate  . Arthritis Father   . Diabetes Brother     Type 1  . Thyroid disease Neg Hx     Social History:  reports that he has quit smoking. His smoking use included Cigarettes. He has a 2.5 pack-year smoking history. He has never used smokeless tobacco. He reports that he uses illicit drugs (Marijuana and Cocaine). He reports that he does not drink alcohol.  Allergies:  Allergies  Allergen Reactions  . Bee Venom Anaphylaxis    Review of Systems:  Review of Systems  Constitutional: Positive for weight loss and reduced appetite.       Appetite is variable, recently less  Eyes: Positive for double vision. Negative for blurred vision.       His eyes tend to stay irritated.  Currently not using any drops, previously used prescription drops for dryness.  Occasionally will see double    Respiratory: Positive for shortness of breath.   Cardiovascular: Positive for palpitations and leg swelling.       Some swelling of the feet especially right  Gastrointestinal: Positive for diarrhea.  Endocrine: Positive for fatigue and heat intolerance.  Genitourinary: Negative for frequency.  Musculoskeletal: Negative for joint pain.  Skin: Negative for itching.  Neurological: Positive for weakness and tremors.  Psychiatric/Behavioral: Positive for depressed mood, nervousness and insomnia.      Examination:   BP 122/64 mmHg  Pulse 100  Temp(Src) 98.7 F (37.1 C) (Oral)  Ht 6' 1.5" (1.867 m)  Wt 130 lb (58.968 kg)  BMI 16.92 kg/m2  SpO2 96%   General Appearance:  asthenic-looking  He appears mildly anxious but not hyperkinetic   Eyes:  he has obvious prominence of his eyes  Exophthalmometry shows right eye to be 19 mm and left eye 20 mm   He has bilateral lid retraction especially the upper eyelid with a stare.  mild lag present.  mild erythema of the left conjunctiva present.  no chemosis.  No swelling of the eyelids   Neck: The thyroid is enlarged times  4-5X normal, smooth, non-tender and diffuse.  Audible bruit and palpable thrill on the left lobe present There is no lymphadenopathy .          Heart: normal S1 and S2, no murmurs .          Lungs: breath sounds are clear bilaterally Abdomen: no hepatosplenomegaly or other palpable abnormality  Extremities: hands are warm. No ankle edema. Neurological: Deep tendon reflexes at biceps are  brisk. Bilateral fine tremors are present. Skin: No rash,  Has some abnormal thickening of the skin on lower anterior legs  with pigmentation seen     Assessment/Plan:   Hyperthyroidism, from Graves' disease   He has had very severe disease which has been largely inadequately controlled's 2013 Although previously had been on 5 mg methimazole twice a day is now  taking 30 mg a day but still clinically appears to be hyperthyroid  and has persistent symptoms He has a large goiter He also has significant Graves eye disease   Discussed with the patient the hyperthyroidism as being an autoimmune thyroid disease.  Since he has had persistent severe disease he should be treated with radioactive iodine However with his significant ophthalmopathy treatment with I-131 may worsen this temporarily  Explained to the patient the best option would be to increase his methimazole enough to make him euthyroid for some time and then go to radioactive iodine treatment with the hope that the ophthalmopathy will also improve in the meantime  He will have thyroid functions checked today to help decide on the dosage of methimazole  Patient handout on radioactive iodine treatment given Patient understands the above discussion and treatment options. All questions were answered satisfactorily  ANEMIA: He appears to have persistent anemia which has not been characterized. Will check iron and B12 levels along with CBC today May possibly have pernicious anemia also   Rain Friedt 04/17/2016, 3:39 PM

## 2016-04-18 ENCOUNTER — Telehealth: Payer: Self-pay | Admitting: Endocrinology

## 2016-04-18 MED ORDER — METHIMAZOLE 10 MG PO TABS: 10.0000 mg | ORAL_TABLET | Freq: Three times a day (TID) | ORAL | Status: DC

## 2016-04-18 NOTE — Telephone Encounter (Signed)
Pt needs methimazole called in please

## 2016-04-18 NOTE — Telephone Encounter (Signed)
Rx submitted

## 2016-04-18 NOTE — Addendum Note (Signed)
Addended by: Ann MakiBAILEY, MEGAN T on: 04/18/2016 03:38 PM   Modules accepted: Orders

## 2016-04-21 ENCOUNTER — Other Ambulatory Visit: Payer: Self-pay

## 2016-04-21 MED ORDER — METHIMAZOLE 10 MG PO TABS: 30.0000 mg | ORAL_TABLET | Freq: Two times a day (BID) | ORAL | Status: DC

## 2016-04-21 NOTE — Progress Notes (Signed)
Quick Note:  Please let patient know that the thyroid is very high, increase methimazole to 3 tablets twice a day, also start iron OTC, ferrous sulfate 325 mg for low level ______

## 2016-05-07 ENCOUNTER — Telehealth: Payer: Self-pay | Admitting: Endocrinology

## 2016-05-07 NOTE — Telephone Encounter (Signed)
Pt needs rx called in for the thyroid med called to [facility] in [place]

## 2016-05-07 NOTE — Telephone Encounter (Signed)
Phillip Kemp, See below, Thanks!

## 2016-05-07 NOTE — Telephone Encounter (Signed)
His prescription for 30 days was sent on 5/8.  Not clear if he is taking 30 mg twice a day of methimazole?

## 2016-05-08 ENCOUNTER — Other Ambulatory Visit: Payer: Self-pay | Admitting: *Deleted

## 2016-05-08 NOTE — Telephone Encounter (Signed)
Message left for him to return call.

## 2016-05-13 ENCOUNTER — Other Ambulatory Visit: Payer: Self-pay

## 2016-05-15 ENCOUNTER — Ambulatory Visit: Payer: Self-pay | Admitting: Endocrinology

## 2016-05-16 ENCOUNTER — Other Ambulatory Visit (INDEPENDENT_AMBULATORY_CARE_PROVIDER_SITE_OTHER): Payer: Medicare Other

## 2016-05-16 DIAGNOSIS — E0500 Thyrotoxicosis with diffuse goiter without thyrotoxic crisis or storm: Secondary | ICD-10-CM

## 2016-05-16 LAB — TSH: TSH: 0.05 u[IU]/mL — ABNORMAL LOW (ref 0.35–4.50)

## 2016-05-16 LAB — T4, FREE: Free T4: 2.85 ng/dL — ABNORMAL HIGH (ref 0.60–1.60)

## 2016-05-19 ENCOUNTER — Encounter: Payer: Self-pay | Admitting: Endocrinology

## 2016-05-19 ENCOUNTER — Ambulatory Visit (INDEPENDENT_AMBULATORY_CARE_PROVIDER_SITE_OTHER): Payer: Medicare Other | Admitting: Endocrinology

## 2016-05-19 VITALS — BP 134/60 | HR 91 | Temp 97.7°F | Resp 14 | Ht 73.0 in | Wt 131.4 lb

## 2016-05-19 DIAGNOSIS — E0590 Thyrotoxicosis, unspecified without thyrotoxic crisis or storm: Secondary | ICD-10-CM | POA: Diagnosis not present

## 2016-05-19 DIAGNOSIS — D509 Iron deficiency anemia, unspecified: Secondary | ICD-10-CM

## 2016-05-19 NOTE — Progress Notes (Signed)
Patient ID: Phillip Kemp, male   DOB: December 08, 1961, 55 y.o.   MRN: [id number]                                                                                                               Reason for Appointment:  Hyperthyroidism,  Follow-up visit  Referring physician:   Calone   History of Present Illness:     In 2013 patient was apparently losing weight, had lost about 50-60 pounds despite a good appetite.  His family also noticed that he had swelling in his neck. He was admitted to the hospital in 09/2012 and found to have tachycardia and hyperthyroidism  He was treated with methimazole but apparently did not have any regular follow-up subsequently He has been sporadically seen  by various physicians hyperthyroidism in the past but has not been regular with follow-up Over the last few years he continues to have difficulties with not gaining weight, having palpitations and shakiness at times, feeling weak, short of breath on exertion and moodiness He does not usually have excessive problems with heat intolerance or sweating and usually not having palpitations He will also get diarrhea if his thyroid levels are not controlled or he is not taking medication   RECENT history He was told to increase in methimazole to  30 mg twice a day in 5/17 when his free T4 level was 3.9 With this he feels less tired and short of breath and also having less fatigue/weakness and shakiness No side effects from methimazole   Wt Readings from Last 3 Encounters:  05/19/16 131 lb 6.4 oz (59.603 kg)  04/17/16 130 lb (58.968 kg)  01/19/16 117 lb (53.071 kg)     He has had periodic thyroid function tests and they showed the following:     Lab Results  Component Value Date   FREET4 2.85* 05/16/2016   FREET4 3.89* 04/17/2016   FREET4 4.16* 01/22/2016   T3FREE 23.8* 04/17/2016   TSH 0.05* 05/16/2016   TSH 0.010* 01/19/2016   TSH 0.06* 07/20/2015    Treatments so far:     Medication List      This list is accurate as of: 05/19/16  9:11 AM.  Always use your most recent med list.               atenolol 25 MG tablet  Commonly known as:  TENORMIN  Take 1 tablet (25 mg total) by mouth daily.     hydrOXYzine 25 MG tablet  Commonly known as:  ATARAX/VISTARIL  Take 1 tablet (25 mg total) by mouth every 6 (six) hours as needed for anxiety (sleep).     methimazole 10 MG tablet  Commonly known as:  TAPAZOLE  Take 3 tablets (30 mg total) by mouth 2 (two) times daily.     mirtazapine 30 MG tablet  Commonly known as:  REMERON  Take 1 tablet (30 mg total) by mouth at bedtime.             Past Medical History  Diagnosis  Date  . Arthritis   . Substance abuse     Cocaine, marijuana, and alcohol  . Suicidal ideation 09/09/2012  . Chronic blood loss anemia 09/09/2012  . Rectal bleeding 09/08/2012  . Acute alcoholic pancreatitis 09/08/2012  . Depression   . Hyperthyroidism   . Gout     Past Surgical History  Procedure Laterality Date  . Orthopedic surgery      Left foot surgery  . Left foot surgery    . Left cataract extraction      Family History  Problem Relation Age of Onset  . Heart disease Mother   . Arthritis Mother   . Alzheimer's disease Mother   . Cancer Father     prostate  . Arthritis Father   . Diabetes Brother     Type 1  . Thyroid disease Neg Hx     Social History:  reports that he has quit smoking. His smoking use included Cigarettes. He has a 2.5 pack-year smoking history. He has never used smokeless tobacco. He reports that he uses illicit drugs (Marijuana and Cocaine). He reports that he does not drink alcohol.  Allergies:  Allergies  Allergen Reactions  . Bee Venom Anaphylaxis     Review of Systems  He has iron deficiency and has not started the supplement as directed    Examination:   BP 134/60 mmHg  Pulse 91  Temp(Src) 97.7 F (36.5 C)  Resp 14  Ht  (1.854 m)  Wt 131 lb 6.4 oz (59.603 kg)  BMI 17.34 kg/m2  SpO2  98%    Eyes:  he has obvious prominence of his eyes  Neurological: Deep tendon reflexes at biceps are  slightly brisk , no tremor.    Assessment/Plan:   Hyperthyroidism, from Graves' disease   He has had severe disease with a large goiter   Although subjectively he is doing somewhat better and his thyroid levels are improved he is still not euthyroid  He will increase his methimazole to 70 mg a day   He will need to have near normal thyroid levels before proceeding with I-131 treatment  ANEMIA: Has on deficiency and he will start taking the iron supplement as directed   Phillip Kemp 05/19/2016, 9:11 AM

## 2016-05-19 NOTE — Patient Instructions (Signed)
4 tabs in and 3 at dinner  OTC iron tab with food

## 2016-06-05 ENCOUNTER — Other Ambulatory Visit: Payer: Self-pay | Admitting: Endocrinology

## 2016-06-05 NOTE — Telephone Encounter (Signed)
Patient need a refill of  methimazole (TAPAZOLE) 10 MG tablet               Order Providers    Prescribing Provider Encounter Provider   Reather LittlerAjay Kumar, MD Sunday SpillersMegan T Bailey, LPN    Medication Detail      Disp Refills Start End                      Ph[id number] [place] - [address]        Additional Information    Associated Reports   View Encounter   Priority and Order Details

## 2016-06-13 ENCOUNTER — Other Ambulatory Visit (INDEPENDENT_AMBULATORY_CARE_PROVIDER_SITE_OTHER): Payer: Medicare Other

## 2016-06-13 DIAGNOSIS — D509 Iron deficiency anemia, unspecified: Secondary | ICD-10-CM | POA: Diagnosis not present

## 2016-06-13 DIAGNOSIS — E0590 Thyrotoxicosis, unspecified without thyrotoxic crisis or storm: Secondary | ICD-10-CM

## 2016-06-13 LAB — T4, FREE: FREE T4: 1.68 ng/dL — AB (ref 0.60–1.60)

## 2016-06-13 LAB — CBC
HCT: 35.1 % — ABNORMAL LOW (ref 39.0–52.0)
Hemoglobin: 11.9 g/dL — ABNORMAL LOW (ref 13.0–17.0)
MCHC: 33.9 g/dL (ref 30.0–36.0)
MCV: 77.1 fl — AB (ref 78.0–100.0)
PLATELETS: 168 10*3/uL (ref 150.0–400.0)
RBC: 4.55 Mil/uL (ref 4.22–5.81)
RDW: 15.3 % (ref 11.5–15.5)
WBC: 7.5 10*3/uL (ref 4.0–10.5)

## 2016-06-13 LAB — ALT: ALT: 11 U/L (ref 0–53)

## 2016-06-13 LAB — T3, FREE: T3 FREE: 11.7 pg/mL — AB (ref 2.3–4.2)

## 2016-06-18 ENCOUNTER — Encounter: Payer: Self-pay | Admitting: Endocrinology

## 2016-06-18 ENCOUNTER — Ambulatory Visit (INDEPENDENT_AMBULATORY_CARE_PROVIDER_SITE_OTHER): Payer: Medicare Other | Admitting: Endocrinology

## 2016-06-18 VITALS — BP 112/56 | HR 97 | Ht 73.0 in | Wt 138.0 lb

## 2016-06-18 DIAGNOSIS — E0590 Thyrotoxicosis, unspecified without thyrotoxic crisis or storm: Secondary | ICD-10-CM | POA: Diagnosis not present

## 2016-06-18 MED ORDER — PROPRANOLOL HCL ER 120 MG PO CP24: 120.0000 mg | ORAL_CAPSULE | Freq: Every day | ORAL | Status: DC

## 2016-06-18 MED ORDER — PREDNISONE 10 MG PO TABS: ORAL_TABLET | ORAL | Status: DC

## 2016-06-18 NOTE — Progress Notes (Signed)
Patient ID: Phillip Kemp, male   DOB: 05-18-1961, 55 y.o.   MRN: [id number]                                                                                                               Reason for Appointment:  Hyperthyroidism,  Follow-up visit  Referring physician:   Calone   History of Present Illness:     In 2013 patient was apparently losing weight, had lost about 50-60 pounds despite a good appetite.  His family also noticed that he had swelling in his neck. He was admitted to the hospital in 09/2012 and found to have tachycardia and hyperthyroidism  He was treated with methimazole but apparently did not have any regular follow-up subsequently He has been sporadically seen  by various physicians hyperthyroidism in the past but has not been regular with follow-up Over the last few years he continues to have difficulties with not gaining weight, having palpitations and shakiness at times, feeling weak, short of breath on exertion and moodiness He does not usually have excessive problems with heat intolerance or sweating and usually not having palpitations He will also get diarrhea if his thyroid levels are not controlled or he is not taking medication   RECENT history: He tends to have markedly increased thyroid levels which have been persistent He was told to increase in methimazole to 70 mg daily in 6/17, previously on 30 mg twice a day since  5/17  With this he feels less tired and has gained back 7 pounds No shakiness No side effects from methimazole He is still having prominence of his eyes, does not see an ophthalmologist   Wt Readings from Last 3 Encounters:  06/18/16 138 lb (62.596 kg)  05/19/16 131 lb 6.4 oz (59.603 kg)  04/17/16 130 lb (58.968 kg)     He has had Regular thyroid function tests and they showed the following:     Lab Results  Component Value Date   FREET4 1.68* 06/13/2016   FREET4 2.85* 05/16/2016   FREET4 3.89* 04/17/2016   T3FREE 11.7*  06/13/2016   T3FREE 23.8* 04/17/2016   TSH 0.05* 05/16/2016   TSH 0.010* 01/19/2016   TSH 0.06* 07/20/2015        Medication List       This list is accurate as of: 06/18/16 11:27 AM.  Always use your most recent med list.               hydrOXYzine 25 MG tablet  Commonly known as:  ATARAX/VISTARIL  Take 1 tablet (25 mg total) by mouth every 6 (six) hours as needed for anxiety (sleep).     methimazole 10 MG tablet  Commonly known as:  TAPAZOLE  TAKE THREE TABLETS BY MOUTH TWICE DAILY     mirtazapine 30 MG tablet  Commonly known as:  REMERON  Take 1 tablet (30 mg total) by mouth at bedtime.     predniSONE 10 MG tablet  Commonly known as:  DELTASONE  3 tablets daily for the  first 3 days a month and 2 tablets daily for the next 7 days and then 1 tablet daily for another 7 days     propranolol ER 120 MG 24 hr capsule  Commonly known as:  INDERAL LA  Take 1 capsule (120 mg total) by mouth daily.             Past Medical History  Diagnosis Date  . Arthritis   . Substance abuse     Cocaine, marijuana, and alcohol  . Suicidal ideation 09/09/2012  . Chronic blood loss anemia 09/09/2012  . Rectal bleeding 09/08/2012  . Acute alcoholic pancreatitis 09/08/2012  . Depression   . Hyperthyroidism   . Gout     Past Surgical History  Procedure Laterality Date  . Orthopedic surgery      Left foot surgery  . Left foot surgery    . Left cataract extraction      Family History  Problem Relation Age of Onset  . Heart disease Mother   . Arthritis Mother   . Alzheimer's disease Mother   . Cancer Father     prostate  . Arthritis Father   . Diabetes Brother     Type 1  . Thyroid disease Neg Hx     Social History:  reports that he has quit smoking. His smoking use included Cigarettes. He has a 2.5 pack-year smoking history. He has never used smokeless tobacco. He reports that he uses illicit drugs (Marijuana and Cocaine). He reports that he does not drink  alcohol.  Allergies:  Allergies  Allergen Reactions  . Bee Venom Anaphylaxis     Review of Systems  He has iron deficiency and has  Lab Results  Component Value Date   WBC 7.5 06/13/2016   HGB 11.9* 06/13/2016   HCT 35.1* 06/13/2016   MCV 77.1* 06/13/2016   PLT 168.0 06/13/2016       Examination:   BP 112/56 mmHg  Pulse 97  Ht 6\' 1"  (1.854 m)  Wt 138 lb (62.596 kg)  BMI 18.21 kg/m2  SpO2 97%  Repeat pulse 96  Eyes:  he has obvious prominence of his eyes, Some edema present on the left conjunctiva Thyroid is enlarged about 4 Times normal bilaterally Neurological: Deep tendon reflexes at biceps are difficult to elicit.  Minimal tremor present    Assessment/Plan:   Hyperthyroidism, from Graves' disease, severe   He has had improvement in his thyroid levels and free T4 is near normal finally but free T3 is still very high He symptomatically better but still has a large goiter Still appears to have some tachycardia despite taking 25 mg atenolol Since he is requiring 70 mg a methimazole will proceed with I-131 treatment He will get prednisone for 2 weeks starting on the day of his I-131 treatment to prevent any worsening Discussed in detail how the I-131 treatment will be done and given him information on this along with adjustment of his medications before and after the treatment  ANEMIA: Has iron deficiency and has improvement in hemoglobin with when necessary supplement   Fordyce Lepak 06/18/2016, 11:27 AM

## 2016-06-18 NOTE — Patient Instructions (Signed)
He will be scheduled for the radioactive iodine uptake test which will take 2 days  then after that will be scheduled for actual treatment with radioactive iodine STOP the methimazole 5 days before going for the uptake test above  Restart the same dose of methimazole 3 days after the actual radioactive iodine  treatment is done  Also start taking PREDNISONE as prescribed the morning of doing the radioactive iodine treatment  COMMON QUESTIONS ABOUT RADIOACTIVE IODINE TREATMENT   Why is radioactive iodine used?  Radioactive iodine is a very common option for treating an overactive thyroid. Normally the thyroid gland uses iodine to make thyroid hormone. An overactive thyroid gland extracts the iodine more completely from the bloodstream. When radioactive iodine is given orally most of it is retained within the overactive thyroid.  The concentrated radioactivity slowly destroys the thyroid cells and controls the over activity.  Because the radioactive rays travel only a very short distance other organs are not affected.  Thus the radioactive iodine works in a targeted manner effectively and safely.   How is radioactive iodine given?  It is usually given as a capsule in a single dose.  First, a very small test dose of radioactive iodine is given and the amount retained in the thyroid is measured the next day with a special counter.  This helps us calculate the dose of radioactive iodine to be given for treatment.  The radioactive iodine is given under the supervision of a Radiologist with the proper precautions.  Since the effective dose of the radioactive iodine is approximate, rarely one may need a second dose to control the over activity.Thus the treatment is extremely simple to take.  What happens after the radioactive iodine is given?  There is a gradual reduction in the over activity of the thyroid.  Initially it takes time to get rid of the excess thyroid hormone already present in the body.   With the slow destruction of the thyroid cells the high levels start coming down after 2 to 3 weeks.  It may take up to 8 weeks to completely control the thyroid.  Usually most of the thyroid cells are destroyed and thyroid levels start getting low by two months.  However this will vary from patient to patient and the thyroid levels may remain normal for some time.  It is very important to have regular follow-up after the treatment.  Once the thyroid levels get low you will be started on a thyroid supplement, taken once daily for life.    Are there any side effects of radioactive iodine?  Generally no side effects are encountered.  Rarely one may have discomfort and swelling in the thyroid gland for a few days.  This should be reported if there is significant pain.  The radiation exposure to internal organs from radioactive iodine is no more than in a kidney x-ray or barium studies.  There are no effects on reproductive organs   No long-term effects including cancer have been seen.

## 2016-07-03 ENCOUNTER — Encounter (HOSPITAL_COMMUNITY)
Admission: RE | Admit: 2016-07-03 | Discharge: 2016-07-03 | Disposition: A | Discharge status: Home / Self Care | Payer: Medicare Other | Source: Ambulatory Visit | Attending: Endocrinology | Admitting: Endocrinology

## 2016-07-03 DIAGNOSIS — E0590 Thyrotoxicosis, unspecified without thyrotoxic crisis or storm: Secondary | ICD-10-CM

## 2016-07-03 MED ORDER — SODIUM IODIDE I 131 CAPSULE
11.4000 | Freq: Once | INTRAVENOUS | Status: AC | PRN
  Administered 2016-07-03: 11.4 via ORAL

## 2016-07-04 ENCOUNTER — Encounter (HOSPITAL_COMMUNITY)
Admission: RE | Admit: 2016-07-04 | Discharge: 2016-07-04 | Disposition: A | Discharge status: Home / Self Care | Payer: Medicare Other | Source: Ambulatory Visit | Attending: Endocrinology | Admitting: Endocrinology

## 2016-07-04 DIAGNOSIS — E0590 Thyrotoxicosis, unspecified without thyrotoxic crisis or storm: Secondary | ICD-10-CM | POA: Diagnosis present

## 2016-07-08 ENCOUNTER — Other Ambulatory Visit: Payer: Self-pay | Admitting: Endocrinology

## 2016-07-08 DIAGNOSIS — E0500 Thyrotoxicosis with diffuse goiter without thyrotoxic crisis or storm: Secondary | ICD-10-CM

## 2016-07-09 ENCOUNTER — Other Ambulatory Visit: Payer: Self-pay

## 2016-07-09 MED ORDER — PREDNISONE 20 MG PO TABS: ORAL_TABLET | ORAL | 0 refills | Status: DC

## 2016-07-09 NOTE — Telephone Encounter (Signed)
Called and spoke with patient. He understand when he is suppose to begin taking the medication. Medication was faxed to pharmacy, he will go pick it up today so that he has it. Patient had no other questions or concerns.

## 2016-07-21 ENCOUNTER — Telehealth: Payer: Self-pay | Admitting: Endocrinology

## 2016-07-21 ENCOUNTER — Other Ambulatory Visit: Payer: Self-pay

## 2016-07-21 MED ORDER — METHIMAZOLE 10 MG PO TABS: 30.0000 mg | ORAL_TABLET | Freq: Two times a day (BID) | ORAL | 0 refills | Status: DC

## 2016-07-21 NOTE — Telephone Encounter (Signed)
Pt is almost out of the tapazole is he to continue to take it if so please call in a refill [facility] in [place]

## 2016-07-21 NOTE — Telephone Encounter (Signed)
Sent in RX for methimazole, as note from Dr.Kumar stated he was still taking it, and had no notes for patient to stop taking it at this time.

## 2016-07-24 ENCOUNTER — Encounter (HOSPITAL_COMMUNITY)
Admission: RE | Admit: 2016-07-24 | Discharge: 2016-07-24 | Disposition: A | Discharge status: Home / Self Care | Payer: Medicare Other | Source: Ambulatory Visit | Attending: Endocrinology | Admitting: Endocrinology

## 2016-07-24 DIAGNOSIS — E0500 Thyrotoxicosis with diffuse goiter without thyrotoxic crisis or storm: Secondary | ICD-10-CM

## 2016-07-24 MED ORDER — SODIUM IODIDE I 131 CAPSULE
25.8000 | Freq: Once | INTRAVENOUS | Status: AC | PRN
  Administered 2016-07-24: 25.8 via ORAL

## 2016-08-01 NOTE — Telephone Encounter (Signed)
Patient ask if it Is ok to take the methimazole (TAPAZOLE) 10 MG tablet. He has finished his radiation treatment. Please advise

## 2016-08-04 NOTE — Telephone Encounter (Signed)
See note and please advise, Thanks!  

## 2016-08-04 NOTE — Telephone Encounter (Signed)
He can refill methimazole but take only 2 tablets twice a day instead of 7 tablets a day He was also supposed to take prednisone, has he done this?

## 2016-08-05 NOTE — Telephone Encounter (Signed)
I contacted the pt and advised of Md's instructions via vm. Requested a call back if the pt would like to discuss and to verify if the pt has started the prednisone.

## 2016-08-06 ENCOUNTER — Other Ambulatory Visit (INDEPENDENT_AMBULATORY_CARE_PROVIDER_SITE_OTHER): Payer: Medicare Other

## 2016-08-06 DIAGNOSIS — E0590 Thyrotoxicosis, unspecified without thyrotoxic crisis or storm: Secondary | ICD-10-CM | POA: Diagnosis not present

## 2016-08-06 LAB — T4, FREE: Free T4: 4.71 ng/dL — ABNORMAL HIGH (ref 0.60–1.60)

## 2016-08-06 LAB — T3, FREE: T3, Free: 23.1 pg/mL — ABNORMAL HIGH (ref 2.3–4.2)

## 2016-08-06 LAB — ALT: ALT: 27 U/L (ref 0–53)

## 2016-08-08 ENCOUNTER — Other Ambulatory Visit: Payer: Self-pay

## 2016-08-11 ENCOUNTER — Encounter: Payer: Self-pay | Admitting: Endocrinology

## 2016-08-11 ENCOUNTER — Ambulatory Visit (INDEPENDENT_AMBULATORY_CARE_PROVIDER_SITE_OTHER): Payer: Medicare Other | Admitting: Endocrinology

## 2016-08-11 ENCOUNTER — Ambulatory Visit: Payer: Self-pay | Admitting: Endocrinology

## 2016-08-11 VITALS — BP 123/66 | HR 88 | Ht 75.0 in | Wt 131.0 lb

## 2016-08-11 DIAGNOSIS — E0590 Thyrotoxicosis, unspecified without thyrotoxic crisis or storm: Secondary | ICD-10-CM | POA: Diagnosis not present

## 2016-08-11 NOTE — Progress Notes (Signed)
Patient ID: Phillip Kemp, male   DOB: December 27, 1960, 55 y.o.   MRN: [id number]                                                                                                               Reason for Appointment:  Hyperthyroidism,  Follow-up visit  Referring physician:   Calone   History of Present Illness:     In 2013 patient was apparently losing weight, had lost about 50-60 pounds despite a good appetite.  His family also noticed that he had swelling in his neck. He was admitted to the hospital in 09/2012 and found to have tachycardia and hyperthyroidism  He was treated with methimazole but apparently did not have any regular follow-up subsequently He has been sporadically seen  by various physicians hyperthyroidism in the past but has not been regular with follow-up Over the last few years he continues to have difficulties with not gaining weight, having palpitations and shakiness at times, feeling weak, short of breath on exertion and moodiness He does not usually have excessive problems with heat intolerance or sweating and usually not having palpitations He will also get diarrhea if his thyroid levels are not controlled or he is not taking medication   RECENT history: He was treated with I-131 using 25.8 mCi on 07/28/16 because of persistent and resistant Hyperthyroidism on large doses of methimazole and a large goiter at baseline  Because of his severe disease and ophthalmopathy was also started on prednisone but not clear if he started this within 3 days of his treatment as discussed He says he did have discomfort in his neck initially along with decreased appetite but this is improved  He did start back on his methimazole but only about a week after his I-131 treatment, currently taking 40 mg twice a day He thinks that he is subjectively feeling better although he has lost weight No palpitations, shakiness or excessive heat intolerance currently Has not had any worsening of his  ophthalmic symptoms or prominence Currently taking also Inderal    Wt Readings from Last 3 Encounters:  08/11/16 131 lb (59.4 kg)  06/18/16 138 lb (62.6 kg)  05/19/16 131 lb 6.4 oz (59.6 kg)     He has had Regular thyroid function tests and they showed the following:     Lab Results  Component Value Date   FREET4 4.71 (H) 08/06/2016   FREET4 1.68 (H) 06/13/2016   FREET4 2.85 (H) 05/16/2016   T3FREE 23.1 (H) 08/06/2016   T3FREE 11.7 (H) 06/13/2016   T3FREE 23.8 (H) 04/17/2016   TSH 0.05 (L) 05/16/2016   TSH 0.010 (L) 01/19/2016   TSH 0.06 (L) 07/20/2015        Medication List       Accurate as of 08/11/16  4:42 PM. Always use your most recent med list.          hydrOXYzine 25 MG tablet Commonly known as:  ATARAX/VISTARIL Take 1 tablet (25 mg total) by mouth every 6 (six) hours as needed  for anxiety (sleep).   methimazole 10 MG tablet Commonly known as:  TAPAZOLE Take 3 tablets (30 mg total) by mouth 2 (two) times daily.   mirtazapine 30 MG tablet Commonly known as:  REMERON Take 1 tablet (30 mg total) by mouth at bedtime.   predniSONE 10 MG tablet Commonly known as:  DELTASONE 3 tablets daily for the first 3 days a month and 2 tablets daily for the next 7 days and then 1 tablet daily for another 7 days   predniSONE 20 MG tablet Commonly known as:  DELTASONE For the first 5 days take 2 tablets in the morning daily with food, next 5 days he will take 1 tablet daily and then take half tablet daily for 10 days   propranolol ER 120 MG 24 hr capsule Commonly known as:  INDERAL LA Take 1 capsule (120 mg total) by mouth daily.            Past Medical History:  Diagnosis Date  . Acute alcoholic pancreatitis 09/08/2012  . Arthritis   . Chronic blood loss anemia 09/09/2012  . Depression   . Gout   . Hyperthyroidism   . Rectal bleeding 09/08/2012  . Substance abuse    Cocaine, marijuana, and alcohol  . Suicidal ideation 09/09/2012    Past Surgical History:   Procedure Laterality Date  . left cataract extraction    . left foot surgery    . ORTHOPEDIC SURGERY     Left foot surgery    Family History  Problem Relation Age of Onset  . Heart disease Mother   . Arthritis Mother   . Alzheimer's disease Mother   . Cancer Father     prostate  . Arthritis Father   . Diabetes Brother     Type 1  . Thyroid disease Neg Hx     Social History:  reports that he has quit smoking. His smoking use included Cigarettes. He has a 2.50 pack-year smoking history. He has never used smokeless tobacco. He reports that he uses drugs, including Marijuana and Cocaine. He reports that he does not drink alcohol.  Allergies:  Allergies  Allergen Reactions  . Bee Venom Anaphylaxis     Review of Systems  He has iron deficiency and has  Lab Results  Component Value Date   WBC 7.5 06/13/2016   HGB 11.9 (L) 06/13/2016   HCT 35.1 (L) 06/13/2016   MCV 77.1 (L) 06/13/2016   PLT 168.0 06/13/2016       Examination:   BP 123/66   Pulse 88   Ht 6\' 3"  (1.905 m)   Wt 131 lb (59.4 kg)   BMI 16.37 kg/m   Repeat pulse 92  Eyes:  he has  prominence of his eyes, with some stare Thyroid is enlarged about 4 Times normal bilaterally, mild 3 along the right side  Neurological: Deep tendon reflexes at biceps are very normal No tremor present Skin not unusually warm    Assessment/Plan:   Hyperthyroidism, from Graves' disease, severe with ophthalmic complications  He recently had I-131 treatment He has just started back on methimazole of week ago and also steroids  He has had improvement in his symptoms subjectively However still has some tachycardia despite taking Inderal His thyroid is nontender although he complained of posttreatment discomfort  Currently his thyroid levels are markedly increased probably from posttreatment thyroiditis  For now will increase his methimazole to 60 mg twice a day and have him follow-up in 3  weeks  Phillip Kemp 08/11/2016, 4:42 PM

## 2016-08-11 NOTE — Patient Instructions (Signed)
Tapazole 3 pills twice daily

## 2016-09-01 ENCOUNTER — Other Ambulatory Visit: Payer: Self-pay

## 2016-09-02 ENCOUNTER — Other Ambulatory Visit (INDEPENDENT_AMBULATORY_CARE_PROVIDER_SITE_OTHER): Payer: Medicare Other

## 2016-09-02 DIAGNOSIS — E0590 Thyrotoxicosis, unspecified without thyrotoxic crisis or storm: Secondary | ICD-10-CM

## 2016-09-02 LAB — TSH: TSH: 0.02 u[IU]/mL — AB (ref 0.35–4.50)

## 2016-09-02 LAB — CBC
HCT: 31.3 % — ABNORMAL LOW (ref 39.0–52.0)
HEMOGLOBIN: 10.5 g/dL — AB (ref 13.0–17.0)
MCHC: 33.7 g/dL (ref 30.0–36.0)
MCV: 80.5 fl (ref 78.0–100.0)
PLATELETS: 67 10*3/uL — AB (ref 150.0–400.0)
RBC: 3.89 Mil/uL — AB (ref 4.22–5.81)
RDW: 15.7 % — ABNORMAL HIGH (ref 11.5–15.5)
WBC: 5.3 10*3/uL (ref 4.0–10.5)

## 2016-09-02 LAB — T4, FREE: FREE T4: 3.05 ng/dL — AB (ref 0.60–1.60)

## 2016-09-04 ENCOUNTER — Ambulatory Visit (INDEPENDENT_AMBULATORY_CARE_PROVIDER_SITE_OTHER): Payer: Medicare Other | Admitting: Endocrinology

## 2016-09-04 VITALS — BP 108/52 | HR 83 | Temp 97.8°F | Resp 14 | Ht 75.0 in | Wt 137.4 lb

## 2016-09-04 DIAGNOSIS — E0590 Thyrotoxicosis, unspecified without thyrotoxic crisis or storm: Secondary | ICD-10-CM | POA: Diagnosis not present

## 2016-09-04 DIAGNOSIS — Z23 Encounter for immunization: Secondary | ICD-10-CM | POA: Diagnosis not present

## 2016-09-04 NOTE — Patient Instructions (Signed)
Methimazole 4 daily

## 2016-09-04 NOTE — Progress Notes (Signed)
Patient ID: Phillip Kemp, male   DOB: 08-Jun-1961, 55 y.o.   MRN: [id number]                                                                                                               Reason for Appointment:  Hyperthyroidism,  Follow-up visit  Referring physician:   Calone   History of Present Illness:     In 2013 patient was apparently losing weight, had lost about 50-60 pounds despite a good appetite.  His family also noticed that he had swelling in his neck. He was admitted to the hospital in 09/2012 and found to have tachycardia and hyperthyroidism  He was treated with methimazole but apparently did not have any regular follow-up subsequently He has been sporadically seen  by various physicians hyperthyroidism in the past but has not been regular with follow-up Over the last few years he continues to have difficulties with not gaining weight, having palpitations and shakiness at times, feeling weak, short of breath on exertion and moodiness He does not usually have excessive problems with heat intolerance or sweating and usually not having palpitations He will also get diarrhea if his thyroid levels are not controlled or he is not taking medication   RECENT history: He was treated with I-131 using 25.8 mCi on 07/28/16 because of persistent and resistant Hyperthyroidism on large doses of methimazole and a large goiter at baseline  Because of his severe disease and ophthalmopathy was also started on prednisone which she has now finished  He did start back on his methimazole and this was increased to 60 mg in August when his free T4 was higher He thinks that he is subjectively feeling better and his weight has increased although partly from prednisone increasing his appetite No palpitations, shakiness or excessive heat intolerance currently  He has noticed that his eyes are also less prominent Currently not taking  Inderal, has finished prescription    Wt Readings from Last 3  Encounters:  09/04/16 137 lb 6.4 oz (62.3 kg)  08/11/16 131 lb (59.4 kg)  06/18/16 138 lb (62.6 kg)     He has had thyroid function tests and they showed the following:     Lab Results  Component Value Date   FREET4 3.05 (H) 09/02/2016   FREET4 4.71 (H) 08/06/2016   FREET4 1.68 (H) 06/13/2016   T3FREE 23.1 (H) 08/06/2016   T3FREE 11.7 (H) 06/13/2016   T3FREE 23.8 (H) 04/17/2016   TSH 0.02 (L) 09/02/2016   TSH 0.05 (L) 05/16/2016   TSH 0.010 (L) 01/19/2016        Medication List       Accurate as of 09/04/16  2:04 PM. Always use your most recent med list.          hydrOXYzine 25 MG tablet Commonly known as:  ATARAX/VISTARIL Take 1 tablet (25 mg total) by mouth every 6 (six) hours as needed for anxiety (sleep).   methimazole 10 MG tablet Commonly known as:  TAPAZOLE Take 3 tablets (30 mg  total) by mouth 2 (two) times daily.   mirtazapine 30 MG tablet Commonly known as:  REMERON Take 1 tablet (30 mg total) by mouth at bedtime.   predniSONE 10 MG tablet Commonly known as:  DELTASONE 3 tablets daily for the first 3 days a month and 2 tablets daily for the next 7 days and then 1 tablet daily for another 7 days   predniSONE 20 MG tablet Commonly known as:  DELTASONE For the first 5 days take 2 tablets in the morning daily with food, next 5 days he will take 1 tablet daily and then take half tablet daily for 10 days   propranolol ER 120 MG 24 hr capsule Commonly known as:  INDERAL LA Take 1 capsule (120 mg total) by mouth daily.            Past Medical History:  Diagnosis Date  . Acute alcoholic pancreatitis 09/08/2012  . Arthritis   . Chronic blood loss anemia 09/09/2012  . Depression   . Gout   . Hyperthyroidism   . Rectal bleeding 09/08/2012  . Substance abuse    Cocaine, marijuana, and alcohol  . Suicidal ideation 09/09/2012    Past Surgical History:  Procedure Laterality Date  . left cataract extraction    . left foot surgery    . ORTHOPEDIC  SURGERY     Left foot surgery    Family History  Problem Relation Age of Onset  . Heart disease Mother   . Arthritis Mother   . Alzheimer's disease Mother   . Cancer Father     prostate  . Arthritis Father   . Diabetes Brother     Type 1  . Thyroid disease Neg Hx     Social History:  reports that he has quit smoking. His smoking use included Cigarettes. He has a 2.50 pack-year smoking history. He has never used smokeless tobacco. He reports that he uses drugs, including Marijuana and Cocaine. He reports that he does not drink alcohol.  Allergies:  Allergies  Allergen Reactions  . Bee Venom Anaphylaxis     Review of Systems  He has iron deficiency and has been or iron  Lab Results  Component Value Date   WBC 5.3 09/02/2016   HGB 10.5 (L) 09/02/2016   HCT 31.3 (L) 09/02/2016   MCV 80.5 09/02/2016   PLT 67.0 (L) 09/02/2016       Examination:   BP (!) 108/52   Pulse 83   Temp 97.8 F (36.6 C)   Resp 14   Ht 6\' 3"  (1.905 m)   Wt 137 lb 6.4 oz (62.3 kg)   SpO2 97%   BMI 17.17 kg/m     Mild prominence of his eyes, no significant stare, no erythema Thyroid is enlarged about 3-3.5 Times normal on the right and about 2.5-3 times on the left Neck circumference is 37 cm Deep tendon reflexes at biceps are appearing normal No tremor present Skin not unusually warm    Assessment/Plan:   Hyperthyroidism, from Graves' disease, severe with ophthalmic complications  He Has had I-131 treatment with improvement in symptoms, slight reduction in size of the goiter and also reduction in eye signs Although his thyroid levels are still significantly high they are improving Subjectively doing well and heart rate is relatively good without Propranolol  He has continued methimazole  For now will decrease his methimazole to 40 mg and he will come back in 3 weeks again   [facility] 09/04/2016, 2:04 PM

## 2016-09-08 ENCOUNTER — Other Ambulatory Visit: Payer: Self-pay | Admitting: Endocrinology

## 2016-09-30 ENCOUNTER — Other Ambulatory Visit: Payer: Self-pay | Admitting: *Deleted

## 2016-09-30 ENCOUNTER — Telehealth: Payer: Self-pay | Admitting: Endocrinology

## 2016-09-30 MED ORDER — METHIMAZOLE 10 MG PO TABS: 30.0000 mg | ORAL_TABLET | Freq: Two times a day (BID) | ORAL | 0 refills | Status: DC

## 2016-09-30 NOTE — Telephone Encounter (Signed)
Pt needs [facility] of [facility] [place]

## 2016-09-30 NOTE — Telephone Encounter (Signed)
Rx sent 

## 2016-10-02 ENCOUNTER — Other Ambulatory Visit (INDEPENDENT_AMBULATORY_CARE_PROVIDER_SITE_OTHER): Payer: Medicare Other

## 2016-10-02 DIAGNOSIS — E0590 Thyrotoxicosis, unspecified without thyrotoxic crisis or storm: Secondary | ICD-10-CM

## 2016-10-02 LAB — T4, FREE: FREE T4: 2.24 ng/dL — AB (ref 0.60–1.60)

## 2016-10-02 LAB — T3, FREE: T3 FREE: 9.5 pg/mL — AB (ref 2.3–4.2)

## 2016-10-02 LAB — TSH: TSH: 0.04 u[IU]/mL — AB (ref 0.35–4.50)

## 2016-10-07 ENCOUNTER — Ambulatory Visit (INDEPENDENT_AMBULATORY_CARE_PROVIDER_SITE_OTHER): Payer: Medicare Other | Admitting: Endocrinology

## 2016-10-07 ENCOUNTER — Encounter: Payer: Self-pay | Admitting: Endocrinology

## 2016-10-07 VITALS — BP 120/72 | HR 87 | Temp 97.9°F | Resp 14 | Ht 75.0 in | Wt 141.2 lb

## 2016-10-07 DIAGNOSIS — E0590 Thyrotoxicosis, unspecified without thyrotoxic crisis or storm: Secondary | ICD-10-CM | POA: Diagnosis not present

## 2016-10-07 NOTE — Progress Notes (Signed)
Patient ID: Phillip Kemp, male   DOB: [birth year], 55 y.o.   MRN: [id number]                                                                                                               Reason for Appointment:  Hyperthyroidism,  Follow-up visit  Referring physician:   Calone   History of Present Illness:    RECENT history: He was treated with I-131 using 25.8 mCi on 07/28/16 because of persistent and resistant Hyperthyroidism on large doses of methimazole and a large goiter at baseline Also after his treatment his methimazole was increased to 60 mg in August when his free T4 was higher He thinks that he is subjectively feeling fairly good since then He does not complain of any fatigue and is able to walk up to 2 miles No palpitations, shakiness or excessive heat intolerance currently His weight has improved also  He has noticed that his eyes are also less prominent and not any worse since I-131 treatment Currently still taking  Inderal, but now he says that he has been also taking atenolol which she had previously taken for hypertension  His methimazole was reduced to 40 mg in 9/17 when free T4 was improving  Wt Readings from Last 3 Encounters:  10/07/16 141 lb 3.2 oz (64 kg)  09/04/16 137 lb 6.4 oz (62.3 kg)  08/11/16 131 lb (59.4 kg)     He has had thyroid function tests and they showed the following:     Lab Results  Component Value Date   FREET4 2.24 (H) 10/02/2016   FREET4 3.05 (H) 09/02/2016   FREET4 4.71 (H) 08/06/2016   T3FREE 9.5 (H) 10/02/2016   T3FREE 23.1 (H) 08/06/2016   T3FREE 11.7 (H) 06/13/2016   TSH 0.04 (L) 10/02/2016   TSH 0.02 (L) 09/02/2016   TSH 0.05 (L) 05/16/2016        Medication List       Accurate as of 10/07/16  3:08 PM. Always use your most recent med list.          hydrOXYzine 25 MG tablet Commonly known as:  ATARAX/VISTARIL Take 1 tablet (25 mg total) by mouth every 6 (six) hours as needed for anxiety (sleep).     methimazole 10 MG tablet Commonly known as:  TAPAZOLE Take 3 tablets (30 mg total) by mouth 2 (two) times daily.   mirtazapine 30 MG tablet Commonly known as:  REMERON Take 1 tablet (30 mg total) by mouth at bedtime.   predniSONE 10 MG tablet Commonly known as:  DELTASONE 3 tablets daily for the first 3 days a month and 2 tablets daily for the next 7 days and then 1 tablet daily for another 7 days   predniSONE 20 MG tablet Commonly known as:  DELTASONE For the first 5 days take 2 tablets in the morning daily with food, next 5 days he will take 1 tablet daily and then take half tablet daily for 10 days   propranolol ER 120 MG 24  hr capsule Commonly known as:  INDERAL LA Take 1 capsule (120 mg total) by mouth daily.            Past Medical History:  Diagnosis Date  . Acute alcoholic pancreatitis 09/08/2012  . Arthritis   . Chronic blood loss anemia 09/09/2012  . Depression   . Gout   . Hyperthyroidism   . Rectal bleeding 09/08/2012  . Substance abuse    Cocaine, marijuana, and alcohol  . Suicidal ideation 09/09/2012    Past Surgical History:  Procedure Laterality Date  . left cataract extraction    . left foot surgery    . ORTHOPEDIC SURGERY     Left foot surgery    Family History  Problem Relation Age of Onset  . Heart disease Mother   . Arthritis Mother   . Alzheimer's disease Mother   . Cancer Father     prostate  . Arthritis Father   . Diabetes Brother     Type 1  . Thyroid disease Neg Hx     Social History:  reports that he has quit smoking. His smoking use included Cigarettes. He has a 2.50 pack-year smoking history. He has never used smokeless tobacco. He reports that he uses drugs, including Marijuana and Cocaine. He reports that he does not drink alcohol.  Allergies:  Allergies  Allergen Reactions  . Bee Venom Anaphylaxis     Review of Systems  He has iron deficiency and has been or iron  Lab Results  Component Value Date   WBC 5.3  09/02/2016   HGB 10.5 (L) 09/02/2016   HCT 31.3 (L) 09/02/2016   MCV 80.5 09/02/2016   PLT 67.0 (L) 09/02/2016       Examination:   BP 120/72   Pulse 87   Temp 97.9 F (36.6 C)   Resp 14   Ht 6\' 3"  (1.905 m)   Wt 141 lb 3.2 oz (64 kg)   SpO2 98%   BMI 17.65 kg/m     He still has some stare and prominence of his eyes Thyroid is enlarged about 3-3.5 Times normal on the right and about 2.5-3 times on the left, feels relatively firm Neck circumference is 38 cm Deep tendon reflexes at biceps are slightly brisk Has minimal tremor present  Skin not unusually warm    Assessment/Plan:   Hyperthyroidism, from Graves' disease, severe with ophthalmic complications  He had I-131 treatment in August with improvement in symptoms, slight reduction in size of the goiter and also reduction in eye signs Although his thyroid levels are no normal even with continuing methimazole the free T4 level is coming down Free T3 is however still significantly high  He has continued methimazole and since his labs are not quite normal and he is mildly hyperthyroid on exam he will continue the same dose of 40 mg Advised him that he can stop atenolol while continuing Inderal for now  He will come back in 3 weeks again follow-up   Gladyce Mcray 10/07/2016, 3:08 PM

## 2016-10-31 ENCOUNTER — Other Ambulatory Visit (INDEPENDENT_AMBULATORY_CARE_PROVIDER_SITE_OTHER): Payer: Medicare Other

## 2016-10-31 DIAGNOSIS — E0590 Thyrotoxicosis, unspecified without thyrotoxic crisis or storm: Secondary | ICD-10-CM | POA: Diagnosis not present

## 2016-10-31 LAB — T3, FREE: T3, Free: 2.7 pg/mL (ref 2.3–4.2)

## 2016-10-31 LAB — TSH: TSH: 0.05 u[IU]/mL — AB (ref 0.35–4.50)

## 2016-10-31 LAB — T4, FREE: Free T4: 0.43 ng/dL — ABNORMAL LOW (ref 0.60–1.60)

## 2016-11-03 ENCOUNTER — Telehealth: Payer: Self-pay | Admitting: Endocrinology

## 2016-11-03 NOTE — Telephone Encounter (Signed)
Patient need refill of  methimazole (TAPAZOLE) 10 MG tablet propranolol ER (INDERAL LA) 120 MG 24 hr capsule  [facility] - [place], [place] - [address] [phone] (Phone) [phone] (Fax)

## 2016-11-04 ENCOUNTER — Ambulatory Visit: Payer: Self-pay | Admitting: Endocrinology

## 2016-11-12 ENCOUNTER — Telehealth: Payer: Self-pay | Admitting: Endocrinology

## 2016-11-12 ENCOUNTER — Other Ambulatory Visit: Payer: Self-pay

## 2016-11-12 MED ORDER — METHIMAZOLE 10 MG PO TABS: 30.0000 mg | ORAL_TABLET | Freq: Two times a day (BID) | ORAL | 1 refills | Status: DC

## 2016-11-12 MED ORDER — PROPRANOLOL HCL ER 120 MG PO CP24: 120.0000 mg | ORAL_CAPSULE | Freq: Every day | ORAL | 2 refills | Status: DC

## 2016-11-12 NOTE — Telephone Encounter (Signed)
Pt called and needs a refill of his Methimazole and [facility]ol [facility] [address] [place]

## 2016-11-12 NOTE — Telephone Encounter (Signed)
Methimazole is 20 mg twice a day

## 2016-11-14 ENCOUNTER — Ambulatory Visit: Payer: Self-pay | Admitting: Endocrinology

## 2016-11-14 ENCOUNTER — Other Ambulatory Visit: Payer: Self-pay

## 2016-11-14 DIAGNOSIS — Z0289 Encounter for other administrative examinations: Secondary | ICD-10-CM

## 2016-11-14 MED ORDER — METHIMAZOLE 10 MG PO TABS: ORAL_TABLET | ORAL | 1 refills | Status: DC

## 2016-11-14 NOTE — Telephone Encounter (Signed)
Reordered 11/14/16

## 2016-11-24 ENCOUNTER — Ambulatory Visit: Payer: Self-pay | Admitting: Endocrinology

## 2016-11-26 ENCOUNTER — Ambulatory Visit (INDEPENDENT_AMBULATORY_CARE_PROVIDER_SITE_OTHER): Payer: Medicare Other | Admitting: Endocrinology

## 2016-11-26 ENCOUNTER — Encounter: Payer: Self-pay | Admitting: Endocrinology

## 2016-11-26 VITALS — BP 130/78 | HR 75 | Ht 75.0 in | Wt 152.0 lb

## 2016-11-26 DIAGNOSIS — E0590 Thyrotoxicosis, unspecified without thyrotoxic crisis or storm: Secondary | ICD-10-CM | POA: Diagnosis not present

## 2016-11-26 LAB — TSH: TSH: 0.06 u[IU]/mL — AB (ref 0.35–4.50)

## 2016-11-26 LAB — T4, FREE: FREE T4: 0.25 ng/dL — AB (ref 0.60–1.60)

## 2016-11-26 NOTE — Progress Notes (Signed)
Patient ID: Phillip Kemp, male   DOB: 06-20-1961, 55 y.o.   MRN: [id number]                                                                                                               Reason for Appointment:  Hyperthyroidism,  Follow-up visit  Referring physician:   Calone   History of Present Illness:    RECENT history: He was treated with I-131 using 25.8 mCi on 07/28/16 because of persistent and resistant Hyperthyroidism on large doses of methimazole and a large goiter at baseline Also after his treatment his methimazole was increased to 60 mg in August when his free T4 was higher Subsequently has had less hyperthyroid symptoms He has noticed that his eyes are also less prominent and not any worse since I-131 treatment  No palpitations, shakiness or excessive heat intolerance currently His weight has improved further  Although he was supposed to be on 40 mg a day of methimazole he thinks he was taking 60 mg on his last visit However he did not come back for his follow-up after his labs in November He was also told to stop atenolol on the last visit, still on Inderal   Wt Readings from Last 3 Encounters:  11/26/16 152 lb (68.9 kg)  10/07/16 141 lb 3.2 oz (64 kg)  09/04/16 137 lb 6.4 oz (62.3 kg)     He has had thyroid function tests and they showed the following:     Lab Results  Component Value Date   FREET4 0.43 (L) 10/31/2016   FREET4 2.24 (H) 10/02/2016   FREET4 3.05 (H) 09/02/2016   T3FREE 2.7 10/31/2016   T3FREE 9.5 (H) 10/02/2016   T3FREE 23.1 (H) 08/06/2016   TSH 0.05 (L) 10/31/2016   TSH 0.04 (L) 10/02/2016   TSH 0.02 (L) 09/02/2016        Medication List       Accurate as of 11/26/16  3:26 PM. Always use your most recent med list.          hydrOXYzine 25 MG tablet Commonly known as:  ATARAX/VISTARIL Take 1 tablet (25 mg total) by mouth every 6 (six) hours as needed for anxiety (sleep).   methimazole 10 MG tablet Commonly known as:   TAPAZOLE Take 20mg  twice daily   mirtazapine 30 MG tablet Commonly known as:  REMERON Take 1 tablet (30 mg total) by mouth at bedtime.   propranolol ER 120 MG 24 hr capsule Commonly known as:  INDERAL LA Take 1 capsule (120 mg total) by mouth daily.            Past Medical History:  Diagnosis Date  . Acute alcoholic pancreatitis 09/08/2012  . Arthritis   . Chronic blood loss anemia 09/09/2012  . Depression   . Gout   . Hyperthyroidism   . Rectal bleeding 09/08/2012  . Substance abuse    Cocaine, marijuana, and alcohol  . Suicidal ideation 09/09/2012    Past Surgical History:  Procedure Laterality Date  . left  cataract extraction    . left foot surgery    . ORTHOPEDIC SURGERY     Left foot surgery    Family History  Problem Relation Age of Onset  . Heart disease Mother   . Arthritis Mother   . Alzheimer's disease Mother   . Cancer Father     prostate  . Arthritis Father   . Diabetes Brother     Type 1  . Thyroid disease Neg Hx     Social History:  reports that he has quit smoking. His smoking use included Cigarettes. He has a 2.50 pack-year smoking history. He has never used smokeless tobacco. He reports that he uses drugs, including Marijuana and Cocaine. He reports that he does not drink alcohol.  Allergies:  Allergies  Allergen Reactions  . Bee Venom Anaphylaxis     Review of Systems  He has iron deficiency and has been iron, Has been advised to follow-up with PCP  Lab Results  Component Value Date   WBC 5.3 09/02/2016   HGB 10.5 (L) 09/02/2016   HCT 31.3 (L) 09/02/2016   MCV 80.5 09/02/2016   PLT 67.0 (L) 09/02/2016       Examination:   BP 130/78   Pulse 75   Ht 6\' 3"  (1.905 m)   Wt 152 lb (68.9 kg)   SpO2 98%   BMI 19.00 kg/m     He still has stare and prominence of his eyes Exophthalmos present, measuring 22 mm bilaterally  Thyroid is enlarged to-2.5 times normal and firm on the right side and about 1.5-2 times in the left Deep  tendon reflexes  are difficult to elicit both in the upper and lower extremities Has warm hands No tremor present  Skin not unusually dry    Assessment/Plan:   Hyperthyroidism, from Graves' disease, severe with ophthalmic complications  He had I-131 treatment in August with improvement in symptoms He is at progressive reduction in his large goiter size However this is not completely regressed  With taking probably 40 mg methimazole his T4 was low on the last checked in November but he did not follow-up for dosage adjustment He did not report any fatigue with this Currently feels fairly good, gaining weight and not having any tachycardia  Will need to check his thyroid levels again to decide on dosage of methimazole If his thyroid levels are increasing will consider re-treatment with I-131   [facility],Deivi Huckins 11/26/2016, 3:26 PM

## 2016-11-27 NOTE — Progress Notes (Signed)
Please let patient know that the thyroid level is very low, he needs to stop methimazole completely and follow-up as scheduled

## 2016-12-22 ENCOUNTER — Other Ambulatory Visit (INDEPENDENT_AMBULATORY_CARE_PROVIDER_SITE_OTHER): Payer: Medicare Other

## 2016-12-22 DIAGNOSIS — E0590 Thyrotoxicosis, unspecified without thyrotoxic crisis or storm: Secondary | ICD-10-CM | POA: Diagnosis not present

## 2016-12-22 LAB — T4, FREE: FREE T4: 3.14 ng/dL — AB (ref 0.60–1.60)

## 2016-12-22 LAB — TSH: TSH: 0.03 u[IU]/mL — AB (ref 0.35–4.50)

## 2016-12-25 ENCOUNTER — Ambulatory Visit (INDEPENDENT_AMBULATORY_CARE_PROVIDER_SITE_OTHER): Payer: Medicare Other | Admitting: Endocrinology

## 2016-12-25 ENCOUNTER — Encounter: Payer: Self-pay | Admitting: Endocrinology

## 2016-12-25 VITALS — BP 118/70 | HR 72 | Ht 75.0 in | Wt 145.0 lb

## 2016-12-25 DIAGNOSIS — E0590 Thyrotoxicosis, unspecified without thyrotoxic crisis or storm: Secondary | ICD-10-CM | POA: Diagnosis not present

## 2016-12-25 MED ORDER — PROPRANOLOL HCL ER 120 MG PO CP24: 120.0000 mg | ORAL_CAPSULE | Freq: Every day | ORAL | 2 refills | Status: DC

## 2016-12-25 NOTE — Progress Notes (Signed)
Patient ID: Phillip Kemp, male   DOB: Jun 04, 1961, 56 y.o.   MRN: [id number]                                                                                                               Reason for Appointment:  Hyperthyroidism,  Follow-up visit  Referring physician:   Calone   History of Present Illness:    RECENT history: He was treated with I-131 using 25.8 mCi on 07/28/16 because of persistent and resistant Hyperthyroidism on large doses of methimazole and a large goiter at baseline Also after his treatment his methimazole was increased to 60 mg in August when his free T4 was higher Subsequently has had less hyperthyroid symptoms He has noticed that his eyes are also less prominent and not any worse since I-131 treatment   He had been on methimazole after his I-131 treatment He was told to reduce his dose to 40 mg but he continued to use 60 mg and was irregular with his follow-up In 12/17 his thyroid levels were significantly low and he was told to stop methimazole. He appears to have lost weight since his last visit but he does not complain of any palpitations, shakiness or heat intolerance.  However he continues to take propranolol on his own   Wt Readings from Last 3 Encounters:  12/25/16 145 lb (65.8 kg)  11/26/16 152 lb (68.9 kg)  10/07/16 141 lb 3.2 oz (64 kg)     He has had thyroid function tests and they showed the following:     Lab Results  Component Value Date   FREET4 3.14 (H) 12/22/2016   FREET4 0.25 (L) 11/26/2016   FREET4 0.43 (L) 10/31/2016   T3FREE 2.7 10/31/2016   T3FREE 9.5 (H) 10/02/2016   T3FREE 23.1 (H) 08/06/2016   TSH 0.03 (L) 12/22/2016   TSH 0.06 (L) 11/26/2016   TSH 0.05 (L) 10/31/2016      Allergies as of 12/25/2016      Reactions   Bee Venom Anaphylaxis      Medication List       Accurate as of 12/25/16 11:56 AM. Always use your most recent med list.          hydrOXYzine 25 MG tablet Commonly known as:   ATARAX/VISTARIL Take 1 tablet (25 mg total) by mouth every 6 (six) hours as needed for anxiety (sleep).   methimazole 10 MG tablet Commonly known as:  TAPAZOLE Take 20mg  twice daily   mirtazapine 30 MG tablet Commonly known as:  REMERON Take 1 tablet (30 mg total) by mouth at bedtime.   propranolol ER 120 MG 24 hr capsule Commonly known as:  INDERAL LA Take 1 capsule (120 mg total) by mouth daily.            Past Medical History:  Diagnosis Date  . Acute alcoholic pancreatitis 09/08/2012  . Arthritis   . Chronic blood loss anemia 09/09/2012  . Depression   . Gout   . Hyperthyroidism   . Rectal bleeding 09/08/2012  .  Substance abuse    Cocaine, marijuana, and alcohol  . Suicidal ideation 09/09/2012    Past Surgical History:  Procedure Laterality Date  . left cataract extraction    . left foot surgery    . ORTHOPEDIC SURGERY     Left foot surgery    Family History  Problem Relation Age of Onset  . Heart disease Mother   . Arthritis Mother   . Alzheimer's disease Mother   . Cancer Father     prostate  . Arthritis Father   . Diabetes Brother     Type 1  . Thyroid disease Neg Hx     Social History:  reports that he has quit smoking. His smoking use included Cigarettes. He has a 2.50 pack-year smoking history. He has never used smokeless tobacco. He reports that he uses drugs, including Marijuana and Cocaine. He reports that he does not drink alcohol.  Allergies:  Allergies  Allergen Reactions  . Bee Venom Anaphylaxis     Review of Systems  He has iron deficiency and has been iron, Has been advised to follow-up with PCP  Lab Results  Component Value Date   WBC 5.3 09/02/2016   HGB 10.5 (L) 09/02/2016   HCT 31.3 (L) 09/02/2016   MCV 80.5 09/02/2016   PLT 67.0 (L) 09/02/2016       Examination:   BP 118/70   Pulse 72   Ht 6\' 3"  (1.905 m)   Wt 145 lb (65.8 kg)   SpO2 98%   BMI 18.12 kg/m     He has stare and prominence of his  eyes   Thyroid is enlarged 2.5 times normal and firm on the right side and about 1.5-2 times in the left Deep tendon reflexes  are difficult to elicit   No tremor present  Skin not unusually dry    Assessment/Plan:   Hyperthyroidism, from Graves' disease, severe with ophthalmic complications  He had I-131 treatment in August with improvement in symptoms and progressive reduction in his large goiter size However this is not completely regressed  Although he does not have any symptoms of hyperthyroidism is losing weight now He is having a mildly increased pulse rate despite taking large doses of  Propranolol. His labs indicate recurrence of hyperthyroidism with free T4 level of 3.1 and undetectable TSH.  He appears to have inadequate treatment of his hyperthyroidism with initial dose even though it was adequate at 25.8 mCi.  Discussed that he will need to be re- treated with I-131 However in the meantime will treat him for a month with 30 mg of methimazole to get his thyroid levels back again to normal   Jerryl Holzhauer 12/25/2016, 11:56 AM

## 2016-12-25 NOTE — Patient Instructions (Signed)
Methimazole 2 in am and 1 pm

## 2017-01-22 ENCOUNTER — Other Ambulatory Visit: Payer: Self-pay

## 2017-01-27 ENCOUNTER — Other Ambulatory Visit (INDEPENDENT_AMBULATORY_CARE_PROVIDER_SITE_OTHER): Payer: Medicare Other

## 2017-01-27 DIAGNOSIS — E0590 Thyrotoxicosis, unspecified without thyrotoxic crisis or storm: Secondary | ICD-10-CM

## 2017-01-27 LAB — T4, FREE: FREE T4: 3.01 ng/dL — AB (ref 0.60–1.60)

## 2017-01-29 ENCOUNTER — Encounter: Payer: Self-pay | Admitting: Endocrinology

## 2017-01-29 ENCOUNTER — Ambulatory Visit (INDEPENDENT_AMBULATORY_CARE_PROVIDER_SITE_OTHER): Payer: Medicare Other | Admitting: Endocrinology

## 2017-01-29 VITALS — BP 118/60 | HR 100 | Ht 75.0 in | Wt 144.0 lb

## 2017-01-29 DIAGNOSIS — E0590 Thyrotoxicosis, unspecified without thyrotoxic crisis or storm: Secondary | ICD-10-CM | POA: Diagnosis not present

## 2017-01-29 MED ORDER — METHIMAZOLE 10 MG PO TABS: ORAL_TABLET | ORAL | 1 refills | Status: DC

## 2017-01-29 MED ORDER — PREDNISONE 5 MG PO TABS: 5.0000 mg | ORAL_TABLET | Freq: Every day | ORAL | 0 refills | Status: DC

## 2017-01-29 NOTE — Progress Notes (Signed)
Patient ID: Phillip Kemp, male   DOB: 08-15-1961, 56 y.o.   MRN: [id number]                                                                                                               Reason for Appointment:  Hyperthyroidism,  Follow-up visit  Referring physician:   [facility]   History of Present Illness:    RECENT history: He was treated with I-131 using 25.8 mCi on 07/28/16 because of persistent and resistant Hyperthyroidism on large doses of methimazole and a large goiter at baseline Also after his treatment his methimazole was increased to 60 mg in August when his free T4 was higher Subsequently had less hyperthyroid symptoms He has noticed that his eyes are also less prominent and not any worse since I-131 treatment   He had been on methimazole after his I-131 treatment In 12/17 his thyroid levels were significantly low and he was told to stop methimazole.  Subsequently he has had recurrence of his hyperthyroidism Although initially he lost weight this has leveled off with restarting his methimazole in 1/18 With 30 mg methimazole he is not having any palpitations, shakiness or change in appetite He is able to walk a couple of miles Also he continues to take propranolol   For the last 2 weeks however he says that he is having hives with some itching on and off, some rash on his abdomen today  Thyroid levels show unchanged free T4 level   Wt Readings from Last 3 Encounters:  01/29/17 144 lb (65.3 kg)  12/25/16 145 lb (65.8 kg)  11/26/16 152 lb (68.9 kg)     He has had thyroid function tests and they showed the following:     Lab Results  Component Value Date   FREET4 3.01 (H) 01/27/2017   FREET4 3.14 (H) 12/22/2016   FREET4 0.25 (L) 11/26/2016   T3FREE 2.7 10/31/2016   T3FREE 9.5 (H) 10/02/2016   T3FREE 23.1 (H) 08/06/2016   TSH 0.03 (L) 12/22/2016   TSH 0.06 (L) 11/26/2016   TSH 0.05 (L) 10/31/2016      Allergies as of 01/29/2017      Reactions   Bee  Venom Anaphylaxis      Medication List       Accurate as of 01/29/17  1:23 PM. Always use your most recent med list.          hydrOXYzine 25 MG tablet Commonly known as:  ATARAX/VISTARIL Take 1 tablet (25 mg total) by mouth every 6 (six) hours as needed for anxiety (sleep).   methimazole 10 MG tablet Commonly known as:  TAPAZOLE Take 20mg  twice daily   mirtazapine 30 MG tablet Commonly known as:  REMERON Take 1 tablet (30 mg total) by mouth at bedtime.   propranolol ER 120 MG 24 hr capsule Commonly known as:  INDERAL LA Take 1 capsule (120 mg total) by mouth daily.            Past Medical History:  Diagnosis Date  .  Acute alcoholic pancreatitis 09/08/2012  . Arthritis   . Chronic blood loss anemia 09/09/2012  . Depression   . Gout   . Hyperthyroidism   . Rectal bleeding 09/08/2012  . Substance abuse    Cocaine, marijuana, and alcohol  . Suicidal ideation 09/09/2012    Past Surgical History:  Procedure Laterality Date  . left cataract extraction    . left foot surgery    . ORTHOPEDIC SURGERY     Left foot surgery    Family History  Problem Relation Age of Onset  . Heart disease Mother   . Arthritis Mother   . Alzheimer's disease Mother   . Cancer Father     prostate  . Arthritis Father   . Diabetes Brother     Type 1  . Thyroid disease Neg Hx     Social History:  reports that he has quit smoking. His smoking use included Cigarettes. He has a 2.50 pack-year smoking history. He has never used smokeless tobacco. He reports that he uses drugs, including Marijuana and Cocaine. He reports that he does not drink alcohol.  Allergies:  Allergies  Allergen Reactions  . Bee Venom Anaphylaxis     Review of Systems  He has iron deficiency and has been iron, Has been advised to follow-up with PCP  Lab Results  Component Value Date   WBC 5.3 09/02/2016   HGB 10.5 (L) 09/02/2016   HCT 31.3 (L) 09/02/2016   MCV 80.5 09/02/2016   PLT 67.0 (L) 09/02/2016        Examination:   BP 118/60   Pulse 100   Ht 6\' 3"  (1.905 m)   Wt 144 lb (65.3 kg)   SpO2 98%   BMI 18.00 kg/m     He has stare and Mild proptosis of his eyes Thyroid is enlarged 2.5-3 times normal and firm on the right side and about 1.5-2 times in the left Deep tendon reflexes  are difficult to elicit   No tremor present     Assessment/Plan:   Hyperthyroidism, from Graves' disease, severe with ophthalmic complications  He had I-131 treatment in August 2017 with improvement in symptoms and progressive reduction in his large goiter size However he appears to had a relapse and his thyroid is significantly enlarged now  Also with methimazole 30 mg daily he is not getting much improvement in his free T4 level which is still over 3.0 Also recently is getting hives and itching, not clear if this is related to his hyperthyroidism or the medication  Recommended that we plan his I-131 treatment since he continues to require large doses of treatment He will increase the methimazole to 50 mg Continue with propranolol Start prednisone for his rash and he will use 15 mg right after his I-131 treatment.  Detailed information given and prescriptions sent  Patient Instructions  METHIMAZOLE: Take 3 tablets in the morning and 2 in the evening This will have to be stopped about 5 days before the test dose of the radioactive iodine He will resume this 3 days after the actual treatment dose which will be after the test dose  Continue PROPRANOLOL  PREDNISONE: This will help the rash.  Start taking 5 mg daily for now On the day you have the RADIOACTIVE iodine treatment take 3 pills for 3 days, then 2 pills 1 another 3 days and then continue 1 pill daily until follow-up     Fatma Rutten 01/29/2017, 1:23 PM

## 2017-01-29 NOTE — Patient Instructions (Signed)
METHIMAZOLE: Take 3 tablets in the morning and 2 in the evening This will have to be stopped about 5 days before the test dose of the radioactive iodine He will resume this 3 days after the actual treatment dose which will be after the test dose  Continue PROPRANOLOL  PREDNISONE: This will help the rash.  Start taking 5 mg daily for now On the day you have the RADIOACTIVE iodine treatment take 3 pills for 3 days, then 2 pills 1 another 3 days and then continue 1 pill daily until follow-up

## 2017-02-02 ENCOUNTER — Telehealth: Payer: Self-pay

## 2017-02-02 NOTE — Telephone Encounter (Signed)
He can increase the dose to 2 tablets of 5 mg prednisone Please call nuclear medicine Department at the Hospital and have his I-131 uptake scheduled ASAP

## 2017-02-02 NOTE — Telephone Encounter (Signed)
Spoke with patient and he stated an understanding- he will call and make the appointment with nuclear medicine tomorrow

## 2017-02-02 NOTE — Telephone Encounter (Signed)
Taking the prednisone and he stated that it is not working itching and breaking out with rash and he wants to know if he can go up on dosage- please advise

## 2017-02-26 ENCOUNTER — Encounter (HOSPITAL_COMMUNITY)
Admission: RE | Admit: 2017-02-26 | Discharge: 2017-02-26 | Disposition: A | Discharge status: Home / Self Care | Payer: Medicare Other | Source: Ambulatory Visit | Attending: Endocrinology | Admitting: Endocrinology

## 2017-02-26 DIAGNOSIS — E0590 Thyrotoxicosis, unspecified without thyrotoxic crisis or storm: Secondary | ICD-10-CM | POA: Insufficient documentation

## 2017-02-26 MED ORDER — SODIUM IODIDE I 131 CAPSULE: 12.5000 | Freq: Once | INTRAVENOUS | Status: DC | PRN

## 2017-02-27 ENCOUNTER — Encounter (HOSPITAL_COMMUNITY)
Admission: RE | Admit: 2017-02-27 | Discharge: 2017-02-27 | Disposition: A | Discharge status: Home / Self Care | Payer: Medicare Other | Source: Ambulatory Visit | Attending: Endocrinology | Admitting: Endocrinology

## 2017-02-27 DIAGNOSIS — E0590 Thyrotoxicosis, unspecified without thyrotoxic crisis or storm: Secondary | ICD-10-CM | POA: Diagnosis present

## 2017-03-02 ENCOUNTER — Other Ambulatory Visit (INDEPENDENT_AMBULATORY_CARE_PROVIDER_SITE_OTHER): Payer: Medicare Other

## 2017-03-02 DIAGNOSIS — E0590 Thyrotoxicosis, unspecified without thyrotoxic crisis or storm: Secondary | ICD-10-CM | POA: Diagnosis not present

## 2017-03-02 LAB — T4, FREE: FREE T4: 2.53 ng/dL — AB (ref 0.60–1.60)

## 2017-03-02 LAB — T3, FREE: T3, Free: 12.2 pg/mL — ABNORMAL HIGH (ref 2.3–4.2)

## 2017-03-05 ENCOUNTER — Ambulatory Visit: Payer: Self-pay | Admitting: Endocrinology

## 2017-03-05 ENCOUNTER — Encounter: Payer: Self-pay | Admitting: Endocrinology

## 2017-03-05 ENCOUNTER — Telehealth: Payer: Self-pay | Admitting: Endocrinology

## 2017-03-05 ENCOUNTER — Ambulatory Visit (INDEPENDENT_AMBULATORY_CARE_PROVIDER_SITE_OTHER): Payer: Medicare Other | Admitting: Endocrinology

## 2017-03-05 VITALS — BP 114/62 | HR 93 | Ht 75.0 in | Wt 145.0 lb

## 2017-03-05 DIAGNOSIS — E0590 Thyrotoxicosis, unspecified without thyrotoxic crisis or storm: Secondary | ICD-10-CM

## 2017-03-05 MED ORDER — PREDNISONE 10 MG PO TABS: ORAL_TABLET | ORAL | 2 refills | Status: DC

## 2017-03-05 NOTE — Telephone Encounter (Signed)
Patient  Phillip Kemp, Phillip Kemp    [birth year] he wanted his labs the same day of his appt,  just giving you a [id number]

## 2017-03-05 NOTE — Progress Notes (Signed)
Patient ID: Phillip Kemp, male   DOB: 1961-02-25, 56 y.o.   MRN: [id number]                                                                                                               Reason for Appointment:  Hyperthyroidism,  Follow-up visit  Referring physician:   [facility]   History of Present Illness:    RECENT history: He was treated with I-131 using 25.8 mCi on 07/28/16 because of persistent and resistant Hyperthyroidism on large doses of methimazole and a large goiter at baseline Also after his treatment his methimazole was increased to 60 mg in August when his free T4 was higher Subsequently had less hyperthyroid symptoms He has noticed that his eyes are also less prominent and not any worse since I-131 treatment   He had been on methimazole after his I-131 treatment In 12/17 his thyroid levels were significantly low and he was told to stop methimazole.  Subsequently he has had recurrence of his hyperthyroidism Although initially he lost weight this has leveled off with restarting his methimazole in 1/18  Since his hyperthyroidism was still not controlled on 30 mg he has been now taking 50 mg a day of methimazole since 01/29/79 He says he feels fairly good without palpitations or weight loss but his pulse is still fast Also his labs are still high However he was off the medication for 10 days for an after is I-131 uptake  Since he did not realize he due to get his I-131 treatment he has gone back on his methimazole this week Also he continues to take propranolol   For the last 6 weeks he has continued to have itching and hives although they are relatively better He takes prednisone and Benadryl with partial relief  Thyroid levels show significantly high T3 and also modestly high free T4    Wt Readings from Last 3 Encounters:  03/05/17 145 lb (65.8 kg)  01/29/17 144 lb (65.3 kg)  12/25/16 145 lb (65.8 kg)       Lab Results  Component Value Date   FREET4 2.53  (H) 03/02/2017   FREET4 3.01 (H) 01/27/2017   FREET4 3.14 (H) 12/22/2016   T3FREE 12.2 (H) 03/02/2017   T3FREE 2.7 10/31/2016   T3FREE 9.5 (H) 10/02/2016   TSH 0.03 (L) 12/22/2016   TSH 0.06 (L) 11/26/2016   TSH 0.05 (L) 10/31/2016      Allergies as of 03/05/2017      Reactions   Bee Venom Anaphylaxis      Medication List       Accurate as of 03/05/17  8:24 AM. Always use your most recent med list.          hydrOXYzine 25 MG tablet Commonly known as:  ATARAX/VISTARIL Take 1 tablet (25 mg total) by mouth every 6 (six) hours as needed for anxiety (sleep).   methimazole 10 MG tablet Commonly known as:  TAPAZOLE Take 3 pills in the morning and 2 in the evening   mirtazapine  30 MG tablet Commonly known as:  REMERON Take 1 tablet (30 mg total) by mouth at bedtime.   predniSONE 5 MG tablet Commonly known as:  DELTASONE Take 1 tablet (5 mg total) by mouth daily with breakfast.   propranolol ER 120 MG 24 hr capsule Commonly known as:  INDERAL LA Take 1 capsule (120 mg total) by mouth daily.            Past Medical History:  Diagnosis Date  . Acute alcoholic pancreatitis 09/08/2012  . Arthritis   . Chronic blood loss anemia 09/09/2012  . Depression   . Gout   . Hyperthyroidism   . Rectal bleeding 09/08/2012  . Substance abuse    Cocaine, marijuana, and alcohol  . Suicidal ideation 09/09/2012    Past Surgical History:  Procedure Laterality Date  . left cataract extraction    . left foot surgery    . ORTHOPEDIC SURGERY     Left foot surgery    Family History  Problem Relation Age of Onset  . Heart disease Mother   . Arthritis Mother   . Alzheimer's disease Mother   . Cancer Father     prostate  . Arthritis Father   . Diabetes Brother     Type 1  . Thyroid disease Neg Hx     Social History:  reports that he has quit smoking. His smoking use included Cigarettes. He has a 2.50 pack-year smoking history. He has never used smokeless tobacco. He reports  that he uses drugs, including Marijuana and Cocaine. He reports that he does not drink alcohol.  Allergies:  Allergies  Allergen Reactions  . Bee Venom Anaphylaxis     Review of Systems  He has iron deficiency and has been iron, Has been advised to follow-up with PCP  Lab Results  Component Value Date   WBC 5.3 09/02/2016   HGB 10.5 (L) 09/02/2016   HCT 31.3 (L) 09/02/2016   MCV 80.5 09/02/2016   PLT 67.0 (L) 09/02/2016       Examination:   BP 114/62   Pulse 93   Ht 6\' 3"  (1.905 m)   Wt 145 lb (65.8 kg)   SpO2 97%   BMI 18.12 kg/m     He has stare and proptosis of his eyes Thyroid is enlarged 3 times normal and firm on the right side and about 2-2.5 times on the left Deep tendon reflexes  are difficult to elicit but appear slightly brisk Minimal tremor present    Assessment/Plan:   Hyperthyroidism, from Graves' disease, severe with ophthalmic complications  He continues to have significant Hyperthyroidism in large goiter and is awaiting I-131 treatment Has had only slight improvement in free T4 level increasing methimazole to 50 mg although his treatment was interrupted last week because of doing the I-131 uptake Since his uptake is 71% we should be able to get his hyperthyroidism adequately treated with repeat dose Considering the size of the goiter he will need 28 mCi  Recommendations: I-131 treatment ordered Take methimazole until 5 days before the scheduled treatment and in 3 days after will resume this Continue with propranolol Take prednisone 10-20 mg for his rash and also continue after I-131 treatment  There are no Patient Instructions on file for this visit.    Gwendolyn Mclees 03/05/2017, 8:24 AM

## 2017-03-05 NOTE — Patient Instructions (Signed)
Stop Methimazole 5 days prior to and 3 days after treatment

## 2017-03-05 NOTE — Telephone Encounter (Signed)
Noted  

## 2017-03-26 ENCOUNTER — Encounter (HOSPITAL_COMMUNITY)
Admission: RE | Admit: 2017-03-26 | Discharge: 2017-03-26 | Disposition: A | Discharge status: Home / Self Care | Payer: Medicare Other | Source: Ambulatory Visit | Attending: Endocrinology | Admitting: Endocrinology

## 2017-03-26 DIAGNOSIS — E0590 Thyrotoxicosis, unspecified without thyrotoxic crisis or storm: Secondary | ICD-10-CM | POA: Diagnosis not present

## 2017-03-26 MED ORDER — SODIUM IODIDE I 131 CAPSULE
26.7000 | Freq: Once | INTRAVENOUS | Status: AC | PRN
  Administered 2017-03-26: 26.7 via ORAL

## 2017-04-06 NOTE — Telephone Encounter (Signed)
error 

## 2017-04-13 ENCOUNTER — Other Ambulatory Visit (INDEPENDENT_AMBULATORY_CARE_PROVIDER_SITE_OTHER): Payer: Medicare Other

## 2017-04-13 ENCOUNTER — Telehealth: Payer: Self-pay | Admitting: Endocrinology

## 2017-04-13 DIAGNOSIS — E0590 Thyrotoxicosis, unspecified without thyrotoxic crisis or storm: Secondary | ICD-10-CM

## 2017-04-13 LAB — T3, FREE: T3 FREE: 4.7 pg/mL — AB (ref 2.3–4.2)

## 2017-04-13 LAB — T4, FREE: Free T4: 0.98 ng/dL (ref 0.60–1.60)

## 2017-04-13 MED ORDER — PREDNISONE 10 MG PO TABS: ORAL_TABLET | ORAL | 2 refills | Status: DC

## 2017-04-13 NOTE — Telephone Encounter (Signed)
Refill submitted. 

## 2017-04-13 NOTE — Telephone Encounter (Signed)
Pt needs his Prednisone refilled and sent into the [facility] in [facility] on [address].

## 2017-04-16 ENCOUNTER — Ambulatory Visit (INDEPENDENT_AMBULATORY_CARE_PROVIDER_SITE_OTHER): Payer: Medicare Other | Admitting: Endocrinology

## 2017-04-16 ENCOUNTER — Encounter: Payer: Self-pay | Admitting: Endocrinology

## 2017-04-16 VITALS — BP 160/82 | HR 112 | Ht 75.0 in | Wt 146.8 lb

## 2017-04-16 DIAGNOSIS — E0590 Thyrotoxicosis, unspecified without thyrotoxic crisis or storm: Secondary | ICD-10-CM

## 2017-04-16 NOTE — Patient Instructions (Addendum)
Stop Methimazole and prednisone  Take propranolol as needed

## 2017-04-16 NOTE — Progress Notes (Signed)
Patient ID: Phillip Kemp, male   DOB: 07-07-1961, 56 y.o.   MRN: [id number]                                                                                                               Reason for Appointment:  Hyperthyroidism,  Follow-up visit  Referring physician:   [facility]   History of Present Illness:    RECENT history: He was treated with I-131 using 25.8 mCi on 07/28/16 because of persistent and resistant Hyperthyroidism on large doses of methimazole and a large goiter at baseline Also after his treatment his methimazole was increased to 60 mg in August when his free T4 was higher Subsequently had less hyperthyroid symptoms He has noticed that his eyes are also less prominent and not any worse since I-131 treatment   He had been on methimazole after his I-131 treatment In 12/17 his thyroid levels were significantly low and he was told to stop methimazole.  Subsequently he had recurrence of hyperthyroidism  He has been again treated with I-131, this was given on 03/26/17 with 26.7 mCi He was restarted on methimazole for days after the treatment and is taking 50 mg daily He also takes propranolol, not consistently but he thinks it helps him feel more energetic. He was also having rash and itching and this was better with prednisone, this is improving now and is taking prednisone only as needed  More recently does not complain of palpitations, shakiness or heat intolerance, is able to do his walking Weight is slowly improving  Thyroid levels show his free T4 back to normal at 0.98 T3 level was improved significantly and only slightly high now   Wt Readings from Last 3 Encounters:  04/16/17 146 lb 12.8 oz (66.6 kg)  03/05/17 145 lb (65.8 kg)  01/29/17 144 lb (65.3 kg)       Lab Results  Component Value Date   FREET4 0.98 04/13/2017   FREET4 2.53 (H) 03/02/2017   FREET4 3.01 (H) 01/27/2017   T3FREE 4.7 (H) 04/13/2017   T3FREE 12.2 (H) 03/02/2017   T3FREE 2.7  10/31/2016   TSH 0.03 (L) 12/22/2016   TSH 0.06 (L) 11/26/2016   TSH 0.05 (L) 10/31/2016      Allergies as of 04/16/2017      Reactions   Bee Venom Anaphylaxis      Medication List       Accurate as of 04/16/17 10:17 AM. Always use your most recent med list.          BENADRYL 25 mg capsule Generic drug:  diphenhydrAMINE Take 25 mg by mouth every 6 (six) hours as needed.   hydrOXYzine 25 MG tablet Commonly known as:  ATARAX/VISTARIL Take 1 tablet (25 mg total) by mouth every 6 (six) hours as needed for anxiety (sleep).   methimazole 10 MG tablet Commonly known as:  TAPAZOLE Take 3 pills in the morning and 2 in the evening   mirtazapine 30 MG tablet Commonly known as:  REMERON Take 1 tablet (30 mg  total) by mouth at bedtime.   predniSONE 10 MG tablet Commonly known as:  DELTASONE 1-2 tabs as needed for hives   propranolol ER 120 MG 24 hr capsule Commonly known as:  INDERAL LA Take 1 capsule (120 mg total) by mouth daily.            Past Medical History:  Diagnosis Date  . Acute alcoholic pancreatitis 09/08/2012  . Arthritis   . Chronic blood loss anemia 09/09/2012  . Depression   . Gout   . Hyperthyroidism   . Rectal bleeding 09/08/2012  . Substance abuse    Cocaine, marijuana, and alcohol  . Suicidal ideation 09/09/2012    Past Surgical History:  Procedure Laterality Date  . left cataract extraction    . left foot surgery    . ORTHOPEDIC SURGERY     Left foot surgery    Family History  Problem Relation Age of Onset  . Heart disease Mother   . Arthritis Mother   . Alzheimer's disease Mother   . Cancer Father     prostate  . Arthritis Father   . Diabetes Brother     Type 1  . Thyroid disease Neg Hx     Social History:  reports that he has quit smoking. His smoking use included Cigarettes. He has a 2.50 pack-year smoking history. He has never used smokeless tobacco. He reports that he uses drugs, including Marijuana and Cocaine. He reports that  he does not drink alcohol.  Allergies:  Allergies  Allergen Reactions  . Bee Venom Anaphylaxis     Review of Systems  He has iron deficiency, Has not followed up with PCP and not clear if he is taking iron regularly   Lab Results  Component Value Date   WBC 5.3 09/02/2016   HGB 10.5 (L) 09/02/2016   HCT 31.3 (L) 09/02/2016   MCV 80.5 09/02/2016   PLT 67.0 (L) 09/02/2016      Examination:   BP (!) 160/82   Pulse (!) 112   Ht 6\' 3"  (1.905 m)   Wt 146 lb 12.8 oz (66.6 kg)   SpO2 97%   BMI 18.35 kg/m   He has stare and proptosis of his eyes  Thyroid is enlarged About 2  times normal and firm on the right side and about 1. 5-2 times on the left No tremor present    Assessment/Plan:   Hyperthyroidism, from Graves' disease, severe with ophthalmic complications  He Has had good response to his second I-131 treatment which he had about 3 weeks ago He is subjectively doing well His thyroid enlargement is significantly smaller Although his pulse is fast today this is likely to be from his anxiety today  Thyroid levels are back to perfectly normal free T4 level and this is with taking methimazole currently  Recommendations: Stop methimazole May take propranolol as needed None.  Prednisone  There are no Patient Instructions on file for this visit.    Josh Nicolosi 04/16/2017, 10:17 AM

## 2017-05-25 ENCOUNTER — Other Ambulatory Visit: Payer: Medicare Other

## 2017-05-28 ENCOUNTER — Telehealth: Payer: Self-pay | Admitting: Endocrinology

## 2017-05-28 ENCOUNTER — Ambulatory Visit: Payer: Medicare Other | Admitting: Endocrinology

## 2017-05-28 DIAGNOSIS — Z0289 Encounter for other administrative examinations: Secondary | ICD-10-CM

## 2017-05-28 NOTE — Telephone Encounter (Signed)
Patient no showed today's appt. Please advise on how to follow up. °A. No follow up necessary. °B. Follow up urgent. Contact patient immediately. °C. Follow up necessary. Contact patient and schedule visit in ___ days. °D. Follow up advised. Contact patient and schedule visit in ____weeks. ° °

## 2017-05-31 NOTE — Telephone Encounter (Signed)
Follow up necessary. Contact patient and schedule visit asap 

## 2017-07-09 ENCOUNTER — Other Ambulatory Visit: Payer: Medicare Other

## 2017-07-13 ENCOUNTER — Ambulatory Visit: Payer: Medicare Other | Admitting: Endocrinology

## 2017-07-13 DIAGNOSIS — Z0289 Encounter for other administrative examinations: Secondary | ICD-10-CM

## 2018-02-19 ENCOUNTER — Encounter (HOSPITAL_COMMUNITY): Payer: Self-pay | Admitting: Emergency Medicine

## 2018-02-19 ENCOUNTER — Emergency Department (HOSPITAL_COMMUNITY): Payer: Medicare HMO

## 2018-02-19 ENCOUNTER — Inpatient Hospital Stay (HOSPITAL_COMMUNITY)
Admission: EM | Admit: 2018-02-19 | Discharge: 2018-02-22 | DRG: 194 | Disposition: A | Discharge status: Home Health | Payer: Medicare HMO | Attending: Internal Medicine | Admitting: Internal Medicine

## 2018-02-19 ENCOUNTER — Other Ambulatory Visit: Payer: Self-pay

## 2018-02-19 DIAGNOSIS — Z91030 Bee allergy status: Secondary | ICD-10-CM

## 2018-02-19 DIAGNOSIS — R55 Syncope and collapse: Secondary | ICD-10-CM

## 2018-02-19 DIAGNOSIS — M109 Gout, unspecified: Secondary | ICD-10-CM | POA: Diagnosis present

## 2018-02-19 DIAGNOSIS — R2681 Unsteadiness on feet: Secondary | ICD-10-CM

## 2018-02-19 DIAGNOSIS — J181 Lobar pneumonia, unspecified organism: Principal | ICD-10-CM | POA: Diagnosis present

## 2018-02-19 DIAGNOSIS — F17210 Nicotine dependence, cigarettes, uncomplicated: Secondary | ICD-10-CM | POA: Diagnosis present

## 2018-02-19 DIAGNOSIS — E0500 Thyrotoxicosis with diffuse goiter without thyrotoxic crisis or storm: Secondary | ICD-10-CM | POA: Diagnosis present

## 2018-02-19 DIAGNOSIS — Z590 Homelessness: Secondary | ICD-10-CM

## 2018-02-19 DIAGNOSIS — F10239 Alcohol dependence with withdrawal, unspecified: Secondary | ICD-10-CM | POA: Diagnosis present

## 2018-02-19 DIAGNOSIS — Z9181 History of falling: Secondary | ICD-10-CM

## 2018-02-19 DIAGNOSIS — Z79899 Other long term (current) drug therapy: Secondary | ICD-10-CM

## 2018-02-19 DIAGNOSIS — F1910 Other psychoactive substance abuse, uncomplicated: Secondary | ICD-10-CM | POA: Diagnosis present

## 2018-02-19 DIAGNOSIS — F329 Major depressive disorder, single episode, unspecified: Secondary | ICD-10-CM | POA: Diagnosis present

## 2018-02-19 DIAGNOSIS — J189 Pneumonia, unspecified organism: Secondary | ICD-10-CM | POA: Diagnosis present

## 2018-02-19 DIAGNOSIS — E890 Postprocedural hypothyroidism: Secondary | ICD-10-CM | POA: Diagnosis present

## 2018-02-19 DIAGNOSIS — Z87891 Personal history of nicotine dependence: Secondary | ICD-10-CM

## 2018-02-19 DIAGNOSIS — E038 Other specified hypothyroidism: Secondary | ICD-10-CM

## 2018-02-19 DIAGNOSIS — I951 Orthostatic hypotension: Secondary | ICD-10-CM

## 2018-02-19 LAB — URINALYSIS, ROUTINE W REFLEX MICROSCOPIC
BACTERIA UA: NONE SEEN
BILIRUBIN URINE: NEGATIVE
Glucose, UA: NEGATIVE mg/dL
Ketones, ur: NEGATIVE mg/dL
NITRITE: NEGATIVE
Protein, ur: NEGATIVE mg/dL
SPECIFIC GRAVITY, URINE: 1.025 (ref 1.005–1.030)
pH: 6 (ref 5.0–8.0)

## 2018-02-19 LAB — CBC
HCT: 36.2 % — ABNORMAL LOW (ref 39.0–52.0)
Hemoglobin: 12 g/dL — ABNORMAL LOW (ref 13.0–17.0)
MCH: 30.1 pg (ref 26.0–34.0)
MCHC: 33.1 g/dL (ref 30.0–36.0)
MCV: 90.7 fL (ref 78.0–100.0)
PLATELETS: 192 10*3/uL (ref 150–400)
RBC: 3.99 MIL/uL — ABNORMAL LOW (ref 4.22–5.81)
RDW: 14.2 % (ref 11.5–15.5)
WBC: 10.1 10*3/uL (ref 4.0–10.5)

## 2018-02-19 LAB — T4, FREE: FREE T4: 0.52 ng/dL — AB (ref 0.61–1.12)

## 2018-02-19 LAB — BASIC METABOLIC PANEL
Anion gap: 12 (ref 5–15)
BUN: 8 mg/dL (ref 6–20)
CHLORIDE: 103 mmol/L (ref 101–111)
CO2: 25 mmol/L (ref 22–32)
CREATININE: 0.74 mg/dL (ref 0.61–1.24)
Calcium: 9.4 mg/dL (ref 8.9–10.3)
GFR calc Af Amer: >60 mL/min (ref >60)
GFR calc non Af Amer: >60 mL/min (ref >60)
GLUCOSE: 77 mg/dL (ref 65–99)
Potassium: 4 mmol/L (ref 3.5–5.1)
Sodium: 140 mmol/L (ref 135–145)

## 2018-02-19 LAB — RAPID HIV SCREEN (HIV 1/2 AB+AG)
HIV 1/2 Antibodies: NONREACTIVE
HIV-1 P24 Antigen - HIV24: NONREACTIVE

## 2018-02-19 LAB — INFLUENZA PANEL BY PCR (TYPE A & B)
Influenza A By PCR: NEGATIVE
Influenza B By PCR: NEGATIVE

## 2018-02-19 LAB — I-STAT TROPONIN, ED: Troponin i, poc: 0 ng/mL (ref 0.00–0.08)

## 2018-02-19 LAB — TSH: TSH: 15.244 u[IU]/mL — AB (ref 0.350–4.500)

## 2018-02-19 MED ORDER — SODIUM CHLORIDE 0.9 % IV BOLUS (SEPSIS)
1000.0000 mL | Freq: Once | INTRAVENOUS | Status: AC
  Administered 2018-02-19: 1000 mL via INTRAVENOUS

## 2018-02-19 MED ORDER — ERYTHROMYCIN 5 MG/GM OP OINT
1.0000 "application " | TOPICAL_OINTMENT | Freq: Once | OPHTHALMIC | Status: AC
  Administered 2018-02-19: 1 via OPHTHALMIC
  Filled 2018-02-19: qty 3.5

## 2018-02-19 MED ORDER — IOPAMIDOL (ISOVUE-300) INJECTION 61%
INTRAVENOUS | Status: AC
  Administered 2018-02-19: 75 mL
  Filled 2018-02-19: qty 75

## 2018-02-19 MED ORDER — SODIUM CHLORIDE 0.9 % IV SOLN
1.0000 g | INTRAVENOUS | Status: DC
  Administered 2018-02-21 – 2018-02-22 (×2): 1 g via INTRAVENOUS
  Filled 2018-02-19 (×2): qty 10

## 2018-02-19 MED ORDER — IBUPROFEN 400 MG PO TABS
400.0000 mg | ORAL_TABLET | Freq: Once | ORAL | Status: AC
  Administered 2018-02-19: 400 mg via ORAL
  Filled 2018-02-19: qty 1

## 2018-02-19 MED ORDER — SODIUM CHLORIDE 0.9 % IV SOLN
500.0000 mg | Freq: Once | INTRAVENOUS | Status: AC
  Administered 2018-02-19: 500 mg via INTRAVENOUS
  Filled 2018-02-19: qty 500

## 2018-02-19 MED ORDER — SODIUM CHLORIDE 0.9 % IV SOLN
1.0000 g | Freq: Once | INTRAVENOUS | Status: AC
  Administered 2018-02-19: 1 g via INTRAVENOUS
  Filled 2018-02-19: qty 10

## 2018-02-19 MED ORDER — PROPRANOLOL HCL ER 120 MG PO CP24: 120.0000 mg | ORAL_CAPSULE | Freq: Every day | ORAL | Status: DC

## 2018-02-19 MED ORDER — SODIUM CHLORIDE 0.9 % IV SOLN
INTRAVENOUS | Status: DC
  Administered 2018-02-19 – 2018-02-21 (×2): via INTRAVENOUS

## 2018-02-19 MED ORDER — AZITHROMYCIN 250 MG PO TABS
500.0000 mg | ORAL_TABLET | ORAL | Status: DC
  Administered 2018-02-20 – 2018-02-21 (×2): 500 mg via ORAL
  Filled 2018-02-19 (×3): qty 2

## 2018-02-19 MED ORDER — MIRTAZAPINE 30 MG PO TABS
30.0000 mg | ORAL_TABLET | Freq: Every day | ORAL | Status: DC
  Administered 2018-02-19 – 2018-02-21 (×3): 30 mg via ORAL
  Filled 2018-02-19: qty 1
  Filled 2018-02-19: qty 2
  Filled 2018-02-19 (×2): qty 1
  Filled 2018-02-19: qty 2

## 2018-02-19 MED ORDER — ENOXAPARIN SODIUM 40 MG/0.4ML ~~LOC~~ SOLN
40.0000 mg | SUBCUTANEOUS | Status: DC
  Administered 2018-02-19 – 2018-02-21 (×3): 40 mg via SUBCUTANEOUS
  Filled 2018-02-19 (×3): qty 0.4

## 2018-02-19 MED ORDER — HYDROXYZINE HCL 25 MG PO TABS: 25.0000 mg | ORAL_TABLET | Freq: Four times a day (QID) | ORAL | Status: DC | PRN

## 2018-02-19 NOTE — ED Notes (Signed)
Patient transported to CT 

## 2018-02-19 NOTE — ED Notes (Addendum)
Pt reports subjective fever, N/V, chills, diaphoresis, and cough x 2-3 days. Pt reports he has been taking ibuprofen with no relief. A&Ox4.  Respiration even and unlabored. Pt also reports multiple syncopal episodes yesterday. Unsure if he hit his head.

## 2018-02-19 NOTE — H&P (Signed)
History and Physical    Phillip Kemp ZOX:[id number] DOB: [birth year] DOA: 02/19/2018  PCP: Veryl Speak, FNP  Patient coming from: Home  I have personally briefly reviewed patient's old medical records in [facility]  Chief Complaint: ILI, LOC  HPI: Phillip Kemp is a 57 y.o. male with medical history significant of polysubstance abuse, depression, hyperthyroidism s/p ablation last year.  Patient presents to the ED with c/o dizziness, syncope, headaches, fevers, cough.  Started developing flu like symptoms 2-3 days ago.  Cough productive.  Woke up this AM and L eye more swolen.  Had 2-3 episodes of syncope today at home.  Positive sick contacts.  Currently lives with sister, has been homeless in past.  Denies EtOH use/abuse today.   ED Course: LLL PNA on CXR.  Orthostatics were really negative, though his HR did increase from 60s to 80s when standing (which EDP is suggesting is positive orthostatics).  IVF, rocephin, azithro.   Review of Systems: As per HPI otherwise 10 point review of systems negative.   Past Medical History:  Diagnosis Date  . Acute alcoholic pancreatitis 09/08/2012  . Arthritis   . Chronic blood loss anemia 09/09/2012  . Depression   . Gout   . Hyperthyroidism   . Rectal bleeding 09/08/2012  . Substance abuse (HCC)    Cocaine, marijuana, and alcohol  . Suicidal ideation 09/09/2012    Past Surgical History:  Procedure Laterality Date  . left cataract extraction    . left foot surgery    . ORTHOPEDIC SURGERY     Left foot surgery     reports that he has quit smoking. His smoking use included cigarettes. He has a 2.50 pack-year smoking history. he has never used smokeless tobacco. He reports that he uses drugs. Drugs: Marijuana and Cocaine. He reports that he does not drink alcohol.  Allergies  Allergen Reactions  . Bee Venom Anaphylaxis    Family History  Problem Relation Age of Onset  . Heart disease Mother   . Arthritis Mother     . Alzheimer's disease Mother   . Cancer Father        prostate  . Arthritis Father   . Diabetes Brother        Type 1  . Thyroid disease Neg Hx      Prior to Admission medications   Medication Sig Start Date End Date Taking? Authorizing Provider  diphenhydrAMINE (BENADRYL) 25 mg capsule Take 25 mg by mouth every 6 (six) hours as needed.    [provider]  hydrOXYzine (ATARAX/VISTARIL) 25 MG tablet Take 1 tablet (25 mg total) by mouth every 6 (six) hours as needed for anxiety (sleep). 01/25/16   Adonis Brook, NP  methimazole (TAPAZOLE) 10 MG tablet Take 3 pills in the morning and 2 in the evening 01/29/17   Reather Littler, MD  mirtazapine (REMERON) 30 MG tablet Take 1 tablet (30 mg total) by mouth at bedtime. 01/25/16   Adonis Brook, NP  predniSONE (DELTASONE) 10 MG tablet 1-2 tabs as needed for hives 04/13/17   Reather Littler, MD  propranolol ER (INDERAL LA) 120 MG 24 hr capsule Take 1 capsule (120 mg total) by mouth daily. 12/25/16   Reather Littler, MD    Physical Exam: Vitals:   02/19/18 1647 02/19/18 1715 02/19/18 1730 02/19/18 1745  BP: 133/74 138/67 133/80 140/84  Pulse: 94 91 84 90  Resp: 16 (!) 22 16 (!) 28  Temp: 98.9 F (37.2 C)  TempSrc: Oral     SpO2: 100% 99% 98% 100%    Constitutional: NAD, calm, comfortable Eyes: PERRL, lids and conjunctivae normal ENMT: Mucous membranes are moist. Posterior pharynx clear of any exudate or lesions.Normal dentition.  Neck: normal, supple, no masses, no thyromegaly Respiratory: Cough, LLL crackles Cardiovascular: Regular rate and rhythm, no murmurs / rubs / gallops. No extremity edema. 2+ pedal pulses. No carotid bruits.  Abdomen: no tenderness, no masses palpated. No hepatosplenomegaly. Bowel sounds positive.  Musculoskeletal: no clubbing / cyanosis. No joint deformity upper and lower extremities. Good ROM, no contractures. Normal muscle tone.  Skin: no rashes, lesions, ulcers. No induration Neurologic: CN 2-12 grossly  intact. Sensation intact, DTR normal. Strength 5/5 in all 4.  Psychiatric: Normal judgment and insight. Alert and oriented x 3. Normal mood.    Labs on Admission: I have personally reviewed following labs and imaging studies  CBC: Recent Labs  Lab 02/19/18 1400  WBC 10.1  HGB 12.0*  HCT 36.2*  MCV 90.7  PLT 192   Basic Metabolic Panel: Recent Labs  Lab 02/19/18 1400  NA 140  K 4.0  CL 103  CO2 25  GLUCOSE 77  BUN 8  CREATININE 0.74  CALCIUM 9.4   GFR: CrCl cannot be calculated (Unknown ideal weight.). Liver Function Tests: No results for input(s): AST, ALT, ALKPHOS, BILITOT, PROT, ALBUMIN in the last 168 hours. No results for input(s): LIPASE, AMYLASE in the last 168 hours. No results for input(s): AMMONIA in the last 168 hours. Coagulation Profile: No results for input(s): INR, PROTIME in the last 168 hours. Cardiac Enzymes: No results for input(s): CKTOTAL, CKMB, CKMBINDEX, TROPONINI in the last 168 hours. BNP (last 3 results) No results for input(s): PROBNP in the last 8760 hours. HbA1C: No results for input(s): HGBA1C in the last 72 hours. CBG: No results for input(s): GLUCAP in the last 168 hours. Lipid Profile: No results for input(s): CHOL, HDL, LDLCALC, TRIG, CHOLHDL, LDLDIRECT in the last 72 hours. Thyroid Function Tests: Recent Labs    02/19/18 1759  TSH 15.244*  FREET4 0.52*   Anemia Panel: No results for input(s): VITAMINB12, FOLATE, FERRITIN, TIBC, IRON, RETICCTPCT in the last 72 hours. Urine analysis:    Component Value Date/Time   COLORURINE YELLOW 02/19/2018 1849   APPEARANCEUR CLEAR 02/19/2018 1849   LABSPEC 1.025 02/19/2018 1849   PHURINE 6.0 02/19/2018 1849   GLUCOSEU NEGATIVE 02/19/2018 1849   HGBUR SMALL (A) 02/19/2018 1849   BILIRUBINUR NEGATIVE 02/19/2018 1849   KETONESUR NEGATIVE 02/19/2018 1849   PROTEINUR NEGATIVE 02/19/2018 1849   UROBILINOGEN 0.2 06/21/2015 1737   NITRITE NEGATIVE 02/19/2018 1849   LEUKOCYTESUR TRACE  (A) 02/19/2018 1849    Radiological Exams on Admission: Dg Chest 2 View  Result Date: 02/19/2018 CLINICAL DATA:  Cough and fever EXAM: CHEST - 2 VIEW COMPARISON:  01/07/2017 FINDINGS: Mild left lower lobe airspace disease has developed since the prior study. This could be atelectasis or pneumonia. Remaining lungs are clear. No heart failure or effusion IMPRESSION: Mild left lower lobe atelectasis/pneumonia. Electronically Signed   By: Marlan Palau M.D.   On: 02/19/2018 14:43   Ct Head W Or Wo Contrast  Result Date: 02/19/2018 CLINICAL DATA:  Left eye pain.  Headache for 2 months. EXAM: CT HEAD WITHOUT CONTRAST CT ORBITS WITH CONTRAST TECHNIQUE: Multidetector CT imaging of the head was performed following the standard protocol before bolus injection of intravenous contrast. Multidetector CT imaging of the orbits were performed following the standard protocol after  bolus administration of intravenous contrast. CONTRAST:  75mL ISOVUE-300 IOPAMIDOL (ISOVUE-300) INJECTION 61% COMPARISON:  CT of the orbits 11/21/2014.  Head CT 11/16/2012 FINDINGS: CT HEAD FINDINGS Brain: No evidence of acute infarction, hemorrhage, hydrocephalus, extra-axial collection or mass lesion/mass effect. Generalized atrophy since 2013 with mild lateral and third ventriculomegaly. Vascular: No hyperdense vessel. Skull: Negative CT ORBITS FINDINGS Orbits: The extraocular muscles are enlarged, mildly improved since 2015. Negative appearance of the globes. Symmetric negative lacrimal glands, optic nerve sheath complexes, and superior ophthalmic veins. Borderline bilateral proptosis. No inflammatory changes. Visualized sinuses: Mild mucosal thickening in the paranasal sinuses. No acute sinusitis. Soft tissues: Negative IMPRESSION: Head CT: 1. No acute finding. 2. Generalized atrophy that has developed since 2013 comparison. Orbit CT: 1. No acute finding. 2. 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 The patient had  iodine ablation of the thyroid in 2018. Electronically Signed   By: Marnee SpringJonathon  Watts M.D.   On: 02/19/2018 18:44   Ct Orbits W Contrast  Result Date: 02/19/2018 CLINICAL DATA:  Left eye pain.  Headache for 2 months. EXAM: CT HEAD WITHOUT CONTRAST CT ORBITS WITH CONTRAST TECHNIQUE: Multidetector CT imaging of the head was performed following the standard protocol before bolus injection of intravenous contrast. Multidetector CT imaging of the orbits were performed following the standard protocol after bolus administration of intravenous contrast. CONTRAST:  75mL ISOVUE-300 IOPAMIDOL (ISOVUE-300) INJECTION 61% COMPARISON:  CT of the orbits 11/21/2014.  Head CT 11/16/2012 FINDINGS: CT HEAD FINDINGS Brain: No evidence of acute infarction, hemorrhage, hydrocephalus, extra-axial collection or mass lesion/mass effect. Generalized atrophy since 2013 with mild lateral and third ventriculomegaly. Vascular: No hyperdense vessel. Skull: Negative CT ORBITS FINDINGS Orbits: The extraocular muscles are enlarged, mildly improved since 2015. Negative appearance of the globes. Symmetric negative lacrimal glands, optic nerve sheath complexes, and superior ophthalmic veins. Borderline bilateral proptosis. No inflammatory changes. Visualized sinuses: Mild mucosal thickening in the paranasal sinuses. No acute sinusitis. Soft tissues: Negative IMPRESSION: Head CT: 1. No acute finding. 2. Generalized atrophy that has developed since 2013 comparison. Orbit CT: 1. No acute finding. 2. 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 The patient had iodine ablation of the thyroid in 2018. Electronically Signed   By: Marnee SpringJonathon  Watts M.D.   On: 02/19/2018 18:44    EKG: Independently reviewed.  Assessment/Plan Principal Problem:   Community acquired pneumonia of left lower lobe of lung (HCC) Active Problems:   Substance abuse (HCC)   Graves' disease    1. LLL CAP - 1. PNA pathway 2. Rocephin azithro 3. IVF: 100  cc/hr NS 4. Tele monitor overnight due to syncope 5. Influenza pending 2. Graves disease - 1. Actually hypothyroid now after ablation last year 2. Will hold tapazole for the moment 3. And propanolol 4. Call Dr. Lucianne MussKumar in AM and see what he wants to do long term  DVT prophylaxis: Lovenox Code Status: Full Family Communication: No family in room Disposition Plan: Home after admit Consults called: None Admission status: Place in obs   Hillary BowGARDNER, Saharah Sherrow M. DO [facility] Pager [phone]  If 7AM-7PM, please contact day team taking care of patient [email or web address] Password [email or web address]  02/19/2018, 7:59 PM

## 2018-02-19 NOTE — ED Provider Notes (Signed)
Patient placed in Quick Look pathway, seen and evaluated   Chief Complaint: Flu like symptoms, Syncope  HPI:   57 year old male presents with three days of flu like symptoms and a syncopal episode today. He has been out of the cold a lot due to being homeless. He states he passed out after getting up from a sitting to standing position. He thinks he's had his flu shot. He is a smoker. He's been taking theraflu. He feels very weak and might pass out again.  ROS: +fever, chills, body aches, headache, sore throat, runny nose, eye swelling, chest pain with coughing, SOB, productive cough (yellow sputum).  - abdominal pain  Physical Exam:   Gen: No distress, mildly ill  Neuro: Awake and Alert  Skin: Warm    Focused Exam: HEENT: TMs are normal. Oropharynx is normal. Nasal mucosa is mildly swollen. Conjunctiva is swollen.     Cardio: Regular rhythm. Mildly tachycardic    Lungs: CTA    Abdomen: Non-tender  Will order labs, EKG, CXR. Will order Ibuprofen. He likely will need IV fluids.  Initiation of care has begun. The patient has been counseled on the process, plan, and necessity for staying for the completion/evaluation, and the remainder of the medical screening examination       Bethel BornGekas, Georgette Helmer Marie, PA-C 02/19/18 1419    Donnetta Hutchingook, Brian, MD 02/20/18 1057

## 2018-02-19 NOTE — ED Provider Notes (Signed)
[facility] EMERGENCY DEPARTMENT Provider Note   CSN: [id number] Arrival date & time: 02/19/18  1347     History   Chief Complaint Chief Complaint  Patient presents with  . Influenza  . Loss of Consciousness    HPI Phillip Kemp is a 57 y.o. male history of polysubstance abuse, depression, hyperthyroidism status post radioactive ablation, here presenting with dizziness, syncope, headaches, fevers.  Patient states that he is currently living with his sister right now but was homeless before.  He is been having productive cough for the last 2-3 days and has subjective chills and fever.  He has been taking ibuprofen with minimal relief.  He woke up today in the left eye is more swollen and he also has some headaches.  States that he has about 2-3 episodes of syncope today.  States that he usually feels warm and became diaphoretic and passed out.  Denies any antecedent chest pain or shortness of breath.  He is unsure if he hit his head during those episodes.  Denies any neck pain or neck stiffness or sore throat.  Denies any abdominal pain or vomiting.  States that he is around other people were sick as well.  The history is provided by the patient.    Past Medical History:  Diagnosis Date  . Acute alcoholic pancreatitis 09/08/2012  . Arthritis   . Chronic blood loss anemia 09/09/2012  . Depression   . Gout   . Hyperthyroidism   . Rectal bleeding 09/08/2012  . Substance abuse (HCC)    Cocaine, marijuana, and alcohol  . Suicidal ideation 09/09/2012    Patient Active Problem List   Diagnosis Date Noted  . MDD (major depressive disorder), recurrent severe, without psychosis (HCC) 01/18/2016  . Sleep disturbance 07/20/2015  . Major depressive disorder, recurrent, severe without psychotic features (HCC)   . Major depression, recurrent, chronic (HCC) 06/22/2015  . Alcohol abuse 03/20/2015  . Severe recurrent major depression without psychotic features (HCC)  03/20/2015  . Suicidal ideations 03/20/2015  . Hyperthyroidism 11/25/2014  . Alcohol use disorder, severe, dependence (HCC) 11/22/2014  . Thyroid disease 11/22/2014  . Graves' disease 11/22/2014  . Bipolar 1 disorder, depressed, severe (HCC) 11/21/2014  . Aspiration pneumonia (HCC) 07/15/2013  . Hypokalemia 07/15/2013  . Elevated LFTs 07/15/2013  . Microcytic anemia 07/15/2013  . Thrombocytopenia (HCC) 07/15/2013  . Thyromegaly 07/15/2013  . Adrenal nodule, left 07/15/2013  . Chest pain, midsternal 09/24/2012  . Papules 09/10/2012  . Suicidal ideation 09/09/2012  . Chronic blood loss anemia 09/09/2012  . Acute alcoholic pancreatitis 09/08/2012  . Rectal bleeding 09/08/2012  . Cocaine abuse (HCC) 09/08/2012  . Anemia 09/08/2012  . Left adrenal mass (HCC) 09/08/2012  . Pseudogout of knee 08/27/2011  . Substance abuse (HCC) 08/27/2011  . Chronic pain 08/27/2011    Past Surgical History:  Procedure Laterality Date  . left cataract extraction    . left foot surgery    . ORTHOPEDIC SURGERY     Left foot surgery       Home Medications    Prior to Admission medications   Medication Sig Start Date End Date Taking? Authorizing Provider  diphenhydrAMINE (BENADRYL) 25 mg capsule Take 25 mg by mouth every 6 (six) hours as needed.    [provider]  hydrOXYzine (ATARAX/VISTARIL) 25 MG tablet Take 1 tablet (25 mg total) by mouth every 6 (six) hours as needed for anxiety (sleep). Patient not taking: Reported on 04/16/2017 01/25/16   Adonis Brook,  NP  methimazole (TAPAZOLE) 10 MG tablet Take 3 pills in the morning and 2 in the evening 01/29/17   Reather LittlerKumar, Ajay, MD  mirtazapine (REMERON) 30 MG tablet Take 1 tablet (30 mg total) by mouth at bedtime. 01/25/16   Adonis BrookAgustin, Sheila, NP  predniSONE (DELTASONE) 10 MG tablet 1-2 tabs as needed for hives 04/13/17   Reather LittlerKumar, Ajay, MD  propranolol ER (INDERAL LA) 120 MG 24 hr capsule Take 1 capsule (120 mg total) by mouth daily. 12/25/16   Reather LittlerKumar,  Ajay, MD    Family History Family History  Problem Relation Age of Onset  . Heart disease Mother   . Arthritis Mother   . Alzheimer's disease Mother   . Cancer Father        prostate  . Arthritis Father   . Diabetes Brother        Type 1  . Thyroid disease Neg Hx     Social History Social History   Tobacco Use  . Smoking status: Former Smoker    Packs/day: 0.25    Years: 10.00    Pack years: 2.50    Types: Cigarettes  . Smokeless tobacco: Never Used  Substance Use Topics  . Alcohol use: No  . Drug use: Yes    Types: Marijuana, Cocaine    Comment: Former user - no clean for 2 years     Allergies   Bee venom   Review of Systems Review of Systems  Eyes: Positive for pain and discharge.  Cardiovascular: Positive for syncope.  Neurological: Positive for dizziness, syncope and headaches.  All other systems reviewed and are negative.    Physical Exam Updated Vital Signs BP 140/84   Pulse 90   Temp 98.9 F (37.2 C) (Oral)   Resp (!) 28   SpO2 100%   Physical Exam  Constitutional: He is oriented to person, place, and time.  Chronically ill, dehydrated   HENT:  Head: Normocephalic and atraumatic.  MM dry   Eyes:  Some bilateral proptosis, + bilateral conjunctivitis, L eyelid more swollen than right. Extra ocular movements intact   Neck: Normal range of motion. Neck supple.  No meningeal signs   Cardiovascular: Normal rate, regular rhythm and normal heart sounds.  Pulmonary/Chest: Effort normal.  Crackles L base   Abdominal: Soft. Bowel sounds are normal. He exhibits no distension. There is no tenderness. There is no guarding.  Musculoskeletal: Normal range of motion.  Neurological: He is alert and oriented to person, place, and time. No cranial nerve deficit. Coordination normal.  CN 2-12 intact, nl strength and sensation throughout   Skin: Skin is warm.  Psychiatric: He has a normal mood and affect.  Nursing note and vitals reviewed.    ED  Treatments / Results  Labs (all labs ordered are listed, but only abnormal results are displayed) Labs Reviewed  CBC - Abnormal; Notable for the following components:      Result Value   RBC 3.99 (*)    Hemoglobin 12.0 (*)    HCT 36.2 (*)    All other components within normal limits  URINALYSIS, ROUTINE W REFLEX MICROSCOPIC - Abnormal; Notable for the following components:   Hgb urine dipstick SMALL (*)    Leukocytes, UA TRACE (*)    Squamous Epithelial / LPF 0-5 (*)    All other components within normal limits  TSH - Abnormal; Notable for the following components:   TSH 15.244 (*)    All other components within normal limits  CULTURE, BLOOD (  ROUTINE X 2)  CULTURE, BLOOD (ROUTINE X 2)  URINE CULTURE  BASIC METABOLIC PANEL  RAPID HIV SCREEN (HIV 1/2 AB+AG)  INFLUENZA PANEL BY PCR (TYPE A & B)  T4, FREE  I-STAT CG4 LACTIC ACID, ED  I-STAT TROPONIN, ED  I-STAT CG4 LACTIC ACID, ED    EKG  EKG Interpretation  Date/Time:  Friday February 19 2018 13:58:53 EST Ventricular Rate:  86 PR Interval:  182 QRS Duration: 88 QT Interval:  368 QTC Calculation: 440 R Axis:   -42 Text Interpretation:  Normal sinus rhythm Left axis deviation Possible Anteroseptal infarct , age undetermined Abnormal ECG No significant change since last tracing Confirmed by [name] [phone]) on 02/19/2018 5:43:28 PM       Radiology Dg Chest 2 View  Result Date: 02/19/2018 CLINICAL DATA:  Cough and fever EXAM: CHEST - 2 VIEW COMPARISON:  01/07/2017 FINDINGS: Mild left lower lobe airspace disease has developed since the prior study. This could be atelectasis or pneumonia. Remaining lungs are clear. No heart failure or effusion IMPRESSION: Mild left lower lobe atelectasis/pneumonia. Electronically Signed   By: Marlan Palau M.D.   On: 02/19/2018 14:43   Ct Head W Or Wo Contrast  Result Date: 02/19/2018 CLINICAL DATA:  Left eye pain.  Headache for 2 months. EXAM: CT HEAD WITHOUT CONTRAST CT ORBITS WITH CONTRAST  TECHNIQUE: Multidetector CT imaging of the head was performed following the standard protocol before bolus injection of intravenous contrast. Multidetector CT imaging of the orbits were performed following the standard protocol after bolus administration of intravenous contrast. CONTRAST:  75mL ISOVUE-300 IOPAMIDOL (ISOVUE-300) INJECTION 61% COMPARISON:  CT of the orbits 11/21/2014.  Head CT 11/16/2012 FINDINGS: CT HEAD FINDINGS Brain: No evidence of acute infarction, hemorrhage, hydrocephalus, extra-axial collection or mass lesion/mass effect. Generalized atrophy since 2013 with mild lateral and third ventriculomegaly. Vascular: No hyperdense vessel. Skull: Negative CT ORBITS FINDINGS Orbits: The extraocular muscles are enlarged, mildly improved since 2015. Negative appearance of the globes. Symmetric negative lacrimal glands, optic nerve sheath complexes, and superior ophthalmic veins. Borderline bilateral proptosis. No inflammatory changes. Visualized sinuses: Mild mucosal thickening in the paranasal sinuses. No acute sinusitis. Soft tissues: Negative IMPRESSION: Head CT: 1. No acute finding. 2. Generalized atrophy that has developed since 2013 comparison. Orbit CT: 1. No acute finding. 2. 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 The patient had iodine ablation of the thyroid in 2018. Electronically Signed   By: Marnee Spring M.D.   On: 02/19/2018 18:44   Ct Orbits W Contrast  Result Date: 02/19/2018 CLINICAL DATA:  Left eye pain.  Headache for 2 months. EXAM: CT HEAD WITHOUT CONTRAST CT ORBITS WITH CONTRAST TECHNIQUE: Multidetector CT imaging of the head was performed following the standard protocol before bolus injection of intravenous contrast. Multidetector CT imaging of the orbits were performed following the standard protocol after bolus administration of intravenous contrast. CONTRAST:  75mL ISOVUE-300 IOPAMIDOL (ISOVUE-300) INJECTION 61% COMPARISON:  CT of the orbits  11/21/2014.  Head CT 11/16/2012 FINDINGS: CT HEAD FINDINGS Brain: No evidence of acute infarction, hemorrhage, hydrocephalus, extra-axial collection or mass lesion/mass effect. Generalized atrophy since 2013 with mild lateral and third ventriculomegaly. Vascular: No hyperdense vessel. Skull: Negative CT ORBITS FINDINGS Orbits: The extraocular muscles are enlarged, mildly improved since 2015. Negative appearance of the globes. Symmetric negative lacrimal glands, optic nerve sheath complexes, and superior ophthalmic veins. Borderline bilateral proptosis. No inflammatory changes. Visualized sinuses: Mild mucosal thickening in the paranasal sinuses. No acute sinusitis. Soft  tissues: Negative IMPRESSION: Head CT: 1. No acute finding. 2. Generalized atrophy that has developed since 2013 comparison. Orbit CT: 1. No acute finding. 2. 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 The patient had iodine ablation of the thyroid in 2018. Electronically Signed   By: Marnee Spring M.D.   On: 02/19/2018 18:44    Procedures Procedures (including critical care time)  Medications Ordered in ED Medications  azithromycin (ZITHROMAX) 500 mg in sodium chloride 0.9 % 250 mL IVPB (500 mg Intravenous New Bag/Given 02/19/18 1847)  sodium chloride 0.9 % bolus 1,000 mL (not administered)  erythromycin ophthalmic ointment 1 application (not administered)  ibuprofen (ADVIL,MOTRIN) tablet 400 mg (400 mg Oral Given 02/19/18 1417)  sodium chloride 0.9 % bolus 1,000 mL (1,000 mLs Intravenous New Bag/Given 02/19/18 1757)  cefTRIAXone (ROCEPHIN) 1 g in sodium chloride 0.9 % 100 mL IVPB (1 g Intravenous New Bag/Given 02/19/18 1847)  iopamidol (ISOVUE-300) 61 % injection (75 mLs  Contrast Given 02/19/18 1804)     Initial Impression / Assessment and Plan / ED Course  I have reviewed the triage vital signs and the nursing notes.  Pertinent labs & imaging results that were available during my care of the patient were  reviewed by me and considered in my medical decision making (see chart for details).     KEIGHAN AMEZCUA is a 57 y.o. male here with cough, chills, subjective fever, headaches, eye swelling. Has baseline proptosis from underlying hyperthyroidism but L eye is more swollen on exam. Consider orbital cellulitis vs abscess and possible brain abscess causing his eye pain and headaches. Also consider flu vs pneumonia. Will get labs, lactate, cultures, CXR, CT orbits and brain. Will hydrate and get orthostatics and reassess.   7:26 PM Patient orthostatic with HR criteria. Felt dizzy when standing up. Lactate 2.6 (unable to cross over). CXR showed pneumonia. CT brain/orbits showed no obvious abscess. TSH 15. Patient is on methimazole for hyperthyroidism and is now hypothyroid. Syncope likely multifactorial- hypothyroidism, pneumonia, dehydration. Ordered rocephin, azithromycin. Will admit.    Final Clinical Impressions(s) / ED Diagnoses   Final diagnoses:  None    ED Discharge Orders    None       Charlynne Pander, MD 02/19/18 Serena Croissant

## 2018-02-19 NOTE — ED Triage Notes (Signed)
Pt to ER reports 3 days flu like symptoms - cough, body aches, chills, fatigue, and this morning had +syncopal episode. States chest discomfort as well.

## 2018-02-20 DIAGNOSIS — F1910 Other psychoactive substance abuse, uncomplicated: Secondary | ICD-10-CM

## 2018-02-20 LAB — BLOOD CULTURE ID PANEL (REFLEXED)
Acinetobacter baumannii: NOT DETECTED
CANDIDA KRUSEI: NOT DETECTED
CANDIDA PARAPSILOSIS: NOT DETECTED
CANDIDA TROPICALIS: NOT DETECTED
Candida albicans: NOT DETECTED
Candida glabrata: NOT DETECTED
ESCHERICHIA COLI: NOT DETECTED
Enterobacter cloacae complex: NOT DETECTED
Enterobacteriaceae species: NOT DETECTED
Enterococcus species: NOT DETECTED
HAEMOPHILUS INFLUENZAE: NOT DETECTED
KLEBSIELLA OXYTOCA: NOT DETECTED
KLEBSIELLA PNEUMONIAE: NOT DETECTED
Listeria monocytogenes: NOT DETECTED
METHICILLIN RESISTANCE: NOT DETECTED
Neisseria meningitidis: NOT DETECTED
PROTEUS SPECIES: NOT DETECTED
Pseudomonas aeruginosa: NOT DETECTED
SERRATIA MARCESCENS: NOT DETECTED
STAPHYLOCOCCUS AUREUS BCID: NOT DETECTED
STAPHYLOCOCCUS SPECIES: DETECTED — AB
Streptococcus agalactiae: NOT DETECTED
Streptococcus pneumoniae: NOT DETECTED
Streptococcus pyogenes: NOT DETECTED
Streptococcus species: NOT DETECTED

## 2018-02-20 LAB — HIV ANTIBODY (ROUTINE TESTING W REFLEX): HIV Screen 4th Generation wRfx: NONREACTIVE

## 2018-02-20 LAB — T4, FREE: Free T4: 0.44 ng/dL — ABNORMAL LOW (ref 0.61–1.12)

## 2018-02-20 LAB — STREP PNEUMONIAE URINARY ANTIGEN: Strep Pneumo Urinary Antigen: NEGATIVE

## 2018-02-20 MED ORDER — IBUPROFEN 600 MG PO TABS
600.0000 mg | ORAL_TABLET | Freq: Four times a day (QID) | ORAL | Status: DC | PRN
  Administered 2018-02-20 – 2018-02-22 (×5): 600 mg via ORAL
  Filled 2018-02-20 (×6): qty 1

## 2018-02-20 MED ORDER — IBUPROFEN 400 MG PO TABS
400.0000 mg | ORAL_TABLET | Freq: Once | ORAL | Status: AC
  Administered 2018-02-20: 400 mg via ORAL

## 2018-02-20 MED ORDER — IBUPROFEN 400 MG PO TABS: 400.0000 mg | ORAL_TABLET | Freq: Four times a day (QID) | ORAL | Status: DC | PRN

## 2018-02-20 MED ORDER — LEVOTHYROXINE SODIUM 100 MCG PO TABS
100.0000 ug | ORAL_TABLET | Freq: Every day | ORAL | Status: DC
Start: 2018-02-20 — End: 2018-02-22
  Administered 2018-02-20 – 2018-02-22 (×3): 100 ug via ORAL
  Filled 2018-02-20 (×4): qty 1

## 2018-02-20 NOTE — Progress Notes (Signed)
PROGRESS NOTE    Phillip Kemp  ZOX:[id number] DOB: 07-19-1961 DOA: 02/19/2018 PCP: Veryl Speak, FNP  Brief Narrative:Phillip Kemp is a 57 y.o. male with medical history significant of polysubstance abuse, depression, hyperthyroidism s/p ablation last year.  Patient presents to the ED with c/o dizziness, syncope, headaches, fevers, cough.  Started developing flu like symptoms 2-3 days ago.  Cough productive.  Woke up this AM and L eye more swolen.  Had 2-3 episodes of syncope today at home.  Positive sick contacts.  Assessment & Plan:   Principal Problem:   Community acquired pneumonia of left lower lobe of lung (HCC) - Continue Rocephin and azithromycin -Influenza PCR negative -Supportive care, continue IV fluids today  Hypothyroidism -Following radioactive iodine 2 for Graves' disease -Start Synthroid -Follow-up with Dr. Lucianne Muss  History of Graves' disease - status post radioactive iodine and Tapazole -Now with hypothyroidism -Treatment as above  Homelessness/polysubstance abuse UDS pending  -Social work consult-  DVT prophylaxiLovenox Code Status:  full code  Family Communication: no family at bedside  Disposition Plan: Home pending improvement likely 24-48 hours      Procedures:   Antimicrobials:    Subjective: -Complains of headache cough, congestion  Objective: Vitals:   02/19/18 2323 02/20/18 0527 02/20/18 0845 02/20/18 0925  BP:  136/61  138/82  Pulse:  68  82  Resp:  16  18  Temp:  98.7 F (37.1 C)  99.2 F (37.3 C)  TempSrc:  Oral  Oral  SpO2:  100%  100%  Weight: 68.5 kg (151 lb 0.2 oz)     Height:   6\' 3"  (1.905 m)     Intake/Output Summary (Last 24 hours) at 02/20/2018 1044 Last data filed at 02/20/2018 8119 Gross per 24 hour  Intake 1291.67 ml  Output 3000 ml  Net -1708.33 ml   Filed Weights   02/19/18 2323  Weight: 68.5 kg (151 lb 0.2 oz)    Examination:  General exam: Frail chronically ill appearing,  uncomfortable Respiratory system: Poor air movement, scattered rhonchi Cardiovascular system: S1 & S2 heard, RRR.  Gastrointestinal system: Abdomen is nondistended, soft and nontender.Normal bowel sounds heard. Central nervous system: Alert and oriented. No focal neurological deficits. Extremities: Symmetric 5 x 5 power. Skin: No rashes, lesions or ulcers Psychiatry:  Mood & affect appropriate.     Data Reviewed:   CBC: Recent Labs  Lab 02/19/18 1400  WBC 10.1  HGB 12.0*  HCT 36.2*  MCV 90.7  PLT 192   Basic Metabolic Panel: Recent Labs  Lab 02/19/18 1400  NA 140  K 4.0  CL 103  CO2 25  GLUCOSE 77  BUN 8  CREATININE 0.74  CALCIUM 9.4   GFR: Estimated Creatinine Clearance: 98.7 mL/min (by C-G formula based on SCr of 0.74 mg/dL). Liver Function Tests: No results for input(s): AST, ALT, ALKPHOS, BILITOT, PROT, ALBUMIN in the last 168 hours. No results for input(s): LIPASE, AMYLASE in the last 168 hours. No results for input(s): AMMONIA in the last 168 hours. Coagulation Profile: No results for input(s): INR, PROTIME in the last 168 hours. Cardiac Enzymes: No results for input(s): CKTOTAL, CKMB, CKMBINDEX, TROPONINI in the last 168 hours. BNP (last 3 results) No results for input(s): PROBNP in the last 8760 hours. HbA1C: No results for input(s): HGBA1C in the last 72 hours. CBG: No results for input(s): GLUCAP in the last 168 hours. Lipid Profile: No results for input(s): CHOL, HDL, LDLCALC, TRIG, CHOLHDL, LDLDIRECT in the last 72  hours. Thyroid Function Tests: Recent Labs    02/19/18 1759 02/20/18 0849  TSH 15.244*  --   FREET4 0.52* 0.44*   Anemia Panel: No results for input(s): VITAMINB12, FOLATE, FERRITIN, TIBC, IRON, RETICCTPCT in the last 72 hours. Urine analysis:    Component Value Date/Time   COLORURINE YELLOW 02/19/2018 1849   APPEARANCEUR CLEAR 02/19/2018 1849   LABSPEC 1.025 02/19/2018 1849   PHURINE 6.0 02/19/2018 1849   GLUCOSEU NEGATIVE  02/19/2018 1849   HGBUR SMALL (A) 02/19/2018 1849   BILIRUBINUR NEGATIVE 02/19/2018 1849   KETONESUR NEGATIVE 02/19/2018 1849   PROTEINUR NEGATIVE 02/19/2018 1849   UROBILINOGEN 0.2 06/21/2015 1737   NITRITE NEGATIVE 02/19/2018 1849   LEUKOCYTESUR TRACE (A) 02/19/2018 1849   Sepsis Labs: @LABRCNTIP (procalcitonin:4,lacticidven:4)  )No results found for this or any previous visit (from the past 240 hour(s)).       Radiology Studies: Dg Chest 2 View  Result Date: 02/19/2018 CLINICAL DATA:  Cough and fever EXAM: CHEST - 2 VIEW COMPARISON:  01/07/2017 FINDINGS: Mild left lower lobe airspace disease has developed since the prior study. This could be atelectasis or pneumonia. Remaining lungs are clear. No heart failure or effusion IMPRESSION: Mild left lower lobe atelectasis/pneumonia. Electronically Signed   By: Marlan Palau M.D.   On: 02/19/2018 14:43   Ct Head W Or Wo Contrast  Result Date: 02/19/2018 CLINICAL DATA:  Left eye pain.  Headache for 2 months. EXAM: CT HEAD WITHOUT CONTRAST CT ORBITS WITH CONTRAST TECHNIQUE: Multidetector CT imaging of the head was performed following the standard protocol before bolus injection of intravenous contrast. Multidetector CT imaging of the orbits were performed following the standard protocol after bolus administration of intravenous contrast. CONTRAST:  75mL ISOVUE-300 IOPAMIDOL (ISOVUE-300) INJECTION 61% COMPARISON:  CT of the orbits 11/21/2014.  Head CT 11/16/2012 FINDINGS: CT HEAD FINDINGS Brain: No evidence of acute infarction, hemorrhage, hydrocephalus, extra-axial collection or mass lesion/mass effect. Generalized atrophy since 2013 with mild lateral and third ventriculomegaly. Vascular: No hyperdense vessel. Skull: Negative CT ORBITS FINDINGS Orbits: The extraocular muscles are enlarged, mildly improved since 2015. Negative appearance of the globes. Symmetric negative lacrimal glands, optic nerve sheath complexes, and superior ophthalmic veins.  Borderline bilateral proptosis. No inflammatory changes. Visualized sinuses: Mild mucosal thickening in the paranasal sinuses. No acute sinusitis. Soft tissues: Negative IMPRESSION: Head CT: 1. No acute finding. 2. Generalized atrophy that has developed since 2013 comparison. Orbit CT: 1. No acute finding. 2. 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 The patient had iodine ablation of the thyroid in 2018. Electronically Signed   By: Marnee Spring M.D.   On: 02/19/2018 18:44   Ct Orbits W Contrast  Result Date: 02/19/2018 CLINICAL DATA:  Left eye pain.  Headache for 2 months. EXAM: CT HEAD WITHOUT CONTRAST CT ORBITS WITH CONTRAST TECHNIQUE: Multidetector CT imaging of the head was performed following the standard protocol before bolus injection of intravenous contrast. Multidetector CT imaging of the orbits were performed following the standard protocol after bolus administration of intravenous contrast. CONTRAST:  75mL ISOVUE-300 IOPAMIDOL (ISOVUE-300) INJECTION 61% COMPARISON:  CT of the orbits 11/21/2014.  Head CT 11/16/2012 FINDINGS: CT HEAD FINDINGS Brain: No evidence of acute infarction, hemorrhage, hydrocephalus, extra-axial collection or mass lesion/mass effect. Generalized atrophy since 2013 with mild lateral and third ventriculomegaly. Vascular: No hyperdense vessel. Skull: Negative CT ORBITS FINDINGS Orbits: The extraocular muscles are enlarged, mildly improved since 2015. Negative appearance of the globes. Symmetric negative lacrimal glands, optic nerve sheath complexes, and  superior ophthalmic veins. Borderline bilateral proptosis. No inflammatory changes. Visualized sinuses: Mild mucosal thickening in the paranasal sinuses. No acute sinusitis. Soft tissues: Negative IMPRESSION: Head CT: 1. No acute finding. 2. Generalized atrophy that has developed since 2013 comparison. Orbit CT: 1. No acute finding. 2. 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 The  patient had iodine ablation of the thyroid in 2018. Electronically Signed   By: Marnee SpringJonathon  Watts M.D.   On: 02/19/2018 18:44        Scheduled Meds: . azithromycin  500 mg Oral Q24H  . enoxaparin (LOVENOX) injection  40 mg Subcutaneous Q24H  . levothyroxine  100 mcg Oral QAC breakfast  . mirtazapine  30 mg Oral QHS   Continuous Infusions: . sodium chloride 100 mL/hr at 02/19/18 2123  . [START ON 02/21/2018] cefTRIAXone (ROCEPHIN)  IV       LOS: 0 days    Time spent: 35min    Zannie CovePreetha Myrta Mercer, MD [facility] Page via [email or web address], password [email or web address] After 7PM please contact night-coverage  02/20/2018, 10:44 AM

## 2018-02-20 NOTE — Progress Notes (Signed)
PHARMACY - PHYSICIAN COMMUNICATION CRITICAL VALUE ALERT - BLOOD CULTURE IDENTIFICATION (BCID)  Results for orders placed or performed during the hospital encounter of 02/19/18  Blood Culture ID Panel (Reflexed) (Collected: 02/19/2018  5:50 PM)  Result Value Ref Range   Enterococcus species NOT DETECTED NOT DETECTED   Listeria monocytogenes NOT DETECTED NOT DETECTED   Staphylococcus species DETECTED (A) NOT DETECTED   Staphylococcus aureus NOT DETECTED NOT DETECTED   Methicillin resistance NOT DETECTED NOT DETECTED   Streptococcus species NOT DETECTED NOT DETECTED   Streptococcus agalactiae NOT DETECTED NOT DETECTED   Streptococcus pneumoniae NOT DETECTED NOT DETECTED   Streptococcus pyogenes NOT DETECTED NOT DETECTED   Acinetobacter baumannii NOT DETECTED NOT DETECTED   Enterobacteriaceae species NOT DETECTED NOT DETECTED   Enterobacter cloacae complex NOT DETECTED NOT DETECTED   Escherichia coli NOT DETECTED NOT DETECTED   Klebsiella oxytoca NOT DETECTED NOT DETECTED   Klebsiella pneumoniae NOT DETECTED NOT DETECTED   Proteus species NOT DETECTED NOT DETECTED   Serratia marcescens NOT DETECTED NOT DETECTED   Haemophilus influenzae NOT DETECTED NOT DETECTED   Neisseria meningitidis NOT DETECTED NOT DETECTED   Pseudomonas aeruginosa NOT DETECTED NOT DETECTED   Candida albicans NOT DETECTED NOT DETECTED   Candida glabrata NOT DETECTED NOT DETECTED   Candida krusei NOT DETECTED NOT DETECTED   Candida parapsilosis NOT DETECTED NOT DETECTED   Candida tropicalis NOT DETECTED NOT DETECTED    Name of physician (or Provider) Contacted: Jomarie LongsJoseph  Changes to prescribed antibiotics required: 1/2 Blood cultures positive for staph species. Patient on ceftriaxone/azithromycin for PNA. Culture likely contaminant- no changes needed.   Sharin MonsEmily Yulia Ulrich, PharmD, BCPS PGY2 Infectious Diseases Pharmacy Resident Pager: [phone]  02/20/2018  2:55 PM

## 2018-02-21 DIAGNOSIS — Z590 Homelessness: Secondary | ICD-10-CM | POA: Diagnosis not present

## 2018-02-21 DIAGNOSIS — E890 Postprocedural hypothyroidism: Secondary | ICD-10-CM | POA: Diagnosis present

## 2018-02-21 DIAGNOSIS — Z79899 Other long term (current) drug therapy: Secondary | ICD-10-CM | POA: Diagnosis not present

## 2018-02-21 DIAGNOSIS — M109 Gout, unspecified: Secondary | ICD-10-CM | POA: Diagnosis present

## 2018-02-21 DIAGNOSIS — F10239 Alcohol dependence with withdrawal, unspecified: Secondary | ICD-10-CM | POA: Diagnosis present

## 2018-02-21 DIAGNOSIS — Z87891 Personal history of nicotine dependence: Secondary | ICD-10-CM | POA: Diagnosis not present

## 2018-02-21 DIAGNOSIS — J189 Pneumonia, unspecified organism: Secondary | ICD-10-CM | POA: Diagnosis present

## 2018-02-21 DIAGNOSIS — F1910 Other psychoactive substance abuse, uncomplicated: Secondary | ICD-10-CM | POA: Diagnosis present

## 2018-02-21 DIAGNOSIS — F17210 Nicotine dependence, cigarettes, uncomplicated: Secondary | ICD-10-CM | POA: Diagnosis present

## 2018-02-21 DIAGNOSIS — Z91030 Bee allergy status: Secondary | ICD-10-CM | POA: Diagnosis not present

## 2018-02-21 DIAGNOSIS — F329 Major depressive disorder, single episode, unspecified: Secondary | ICD-10-CM | POA: Diagnosis present

## 2018-02-21 DIAGNOSIS — J181 Lobar pneumonia, unspecified organism: Secondary | ICD-10-CM | POA: Diagnosis present

## 2018-02-21 LAB — CBC
HEMATOCRIT: 37.5 % — AB (ref 39.0–52.0)
HEMOGLOBIN: 12.2 g/dL — AB (ref 13.0–17.0)
MCH: 30.3 pg (ref 26.0–34.0)
MCHC: 32.5 g/dL (ref 30.0–36.0)
MCV: 93.3 fL (ref 78.0–100.0)
Platelets: 269 10*3/uL (ref 150–400)
RBC: 4.02 MIL/uL — ABNORMAL LOW (ref 4.22–5.81)
RDW: 14.8 % (ref 11.5–15.5)
WBC: 5.3 10*3/uL (ref 4.0–10.5)

## 2018-02-21 LAB — BASIC METABOLIC PANEL
ANION GAP: 9 (ref 5–15)
BUN: 8 mg/dL (ref 6–20)
CHLORIDE: 109 mmol/L (ref 101–111)
CO2: 23 mmol/L (ref 22–32)
CREATININE: 0.86 mg/dL (ref 0.61–1.24)
Calcium: 8.7 mg/dL — ABNORMAL LOW (ref 8.9–10.3)
GFR calc non Af Amer: >60 mL/min (ref >60)
Glucose, Bld: 89 mg/dL (ref 65–99)
Potassium: 3.8 mmol/L (ref 3.5–5.1)
Sodium: 141 mmol/L (ref 135–145)

## 2018-02-21 LAB — EXPECTORATED SPUTUM ASSESSMENT W GRAM STAIN, RFLX TO RESP C

## 2018-02-21 LAB — URINE CULTURE: CULTURE: NO GROWTH

## 2018-02-21 NOTE — Progress Notes (Signed)
PROGRESS NOTE    Phillip Kemp  WGN:[id number] DOB: 1961/02/17 DOA: 02/19/2018 PCP: Veryl Speakalone, Gregory D, FNP  Brief Narrative:Phillip Kemp is a 57 y.o. male with medical history significant of polysubstance abuse, depression, hyperthyroidism s/p ablation last year.  Patient presents to the ED with c/o dizziness, syncope, headaches, fevers, cough.  Started developing flu like symptoms 2-3 days ago.  Cough productive.  Woke up this AM and L eye more swolen.  Had 2-3 episodes of syncope today at home.  Positive sick contacts.  Assessment & Plan:   Principal Problem:   Community acquired pneumonia of left lower lobe of lung (HCC) - Continue Rocephin and azithromycin -Influenza PCR negative -Supportive care, -Start IV fluids today  Mild alcohol withdrawal -Ativan per CIWA protocol -Counseled -Social work consulted for homelessness too  1/ 2 blood cultures with coagulase-negative staph -Consistent with contamination, no antibiotic therapy indicated  Hypothyroidism -Following radioactive iodine 2 for Graves' disease -Start Synthroid -Follow-up with Dr. Lucianne MussKumar  History of Graves' disease - status post radioactive iodine and Tapazole -Now with hypothyroidism -Treatment as above  Homelessness/polysubstance abuse UDS pending  -Social work consult-  DVT prophylaxiLovenox Code Status:  full code  Family Communication: no family at bedside  Disposition Plan: Home tomorrow if alcohol withdrawal better     Procedures:   Antimicrobials:    Subjective: -Headache cough and congestion improving however feels restless jittery and shaky  Objective: Vitals:   02/20/18 1712 02/20/18 1957 02/21/18 0458 02/21/18 0958  BP: (!) 147/78 (!) 164/83 103/82 138/77  Pulse: 99 66 65 76  Resp: 18 15 16 18   Temp: 98.5 F (36.9 C) 99.7 F (37.6 C) 98.7 F (37.1 C) 98.4 F (36.9 C)  TempSrc: Oral   Oral  SpO2: 100% 96% 100% 100%  Weight:  68 kg (149 lb 14.6 oz)    Height:         Intake/Output Summary (Last 24 hours) at 02/21/2018 1122 Last data filed at 02/21/2018 0947 Gross per 24 hour  Intake 3360 ml  Output 3575 ml  Net -215 ml   Filed Weights   02/19/18 2323 02/20/18 1957  Weight: 68.5 kg (151 lb 0.2 oz) 68 kg (149 lb 14.6 oz)    Examination:  Gen: Thinly built, cachectic disheveled male, laying in bed, uncomfortable shaky HEENT: PERRLA, Neck supple, no JVD Lungs: Improved air movement, scattered rhonchi especially at the right base CVS: RRR,No Gallops,Rubs or new Murmurs Abd: soft, Non tender, non distended, BS present Extremities: Trace edema, positive tremors, no asterixis Skin: no new rashes Psychiatry:  Anxious appearing    Data Reviewed:   CBC: Recent Labs  Lab 02/19/18 1400 02/21/18 0601  WBC 10.1 5.3  HGB 12.0* 12.2*  HCT 36.2* 37.5*  MCV 90.7 93.3  PLT 192 269   Basic Metabolic Panel: Recent Labs  Lab 02/19/18 1400 02/21/18 0601  NA 140 141  K 4.0 3.8  CL 103 109  CO2 25 23  GLUCOSE 77 89  BUN 8 8  CREATININE 0.74 0.86  CALCIUM 9.4 8.7*   GFR: Estimated Creatinine Clearance: 91.1 mL/min (by C-G formula based on SCr of 0.86 mg/dL). Liver Function Tests: No results for input(s): AST, ALT, ALKPHOS, BILITOT, PROT, ALBUMIN in the last 168 hours. No results for input(s): LIPASE, AMYLASE in the last 168 hours. No results for input(s): AMMONIA in the last 168 hours. Coagulation Profile: No results for input(s): INR, PROTIME in the last 168 hours. Cardiac Enzymes: No results for input(s): CKTOTAL,  CKMB, CKMBINDEX, TROPONINI in the last 168 hours. BNP (last 3 results) No results for input(s): PROBNP in the last 8760 hours. HbA1C: No results for input(s): HGBA1C in the last 72 hours. CBG: No results for input(s): GLUCAP in the last 168 hours. Lipid Profile: No results for input(s): CHOL, HDL, LDLCALC, TRIG, CHOLHDL, LDLDIRECT in the last 72 hours. Thyroid Function Tests: Recent Labs    02/19/18 1759  02/20/18 0849  TSH 15.244*  --   FREET4 0.52* 0.44*   Anemia Panel: No results for input(s): VITAMINB12, FOLATE, FERRITIN, TIBC, IRON, RETICCTPCT in the last 72 hours. Urine analysis:    Component Value Date/Time   COLORURINE YELLOW 02/19/2018 1849   APPEARANCEUR CLEAR 02/19/2018 1849   LABSPEC 1.025 02/19/2018 1849   PHURINE 6.0 02/19/2018 1849   GLUCOSEU NEGATIVE 02/19/2018 1849   HGBUR SMALL (A) 02/19/2018 1849   BILIRUBINUR NEGATIVE 02/19/2018 1849   KETONESUR NEGATIVE 02/19/2018 1849   PROTEINUR NEGATIVE 02/19/2018 1849   UROBILINOGEN 0.2 06/21/2015 1737   NITRITE NEGATIVE 02/19/2018 1849   LEUKOCYTESUR TRACE (A) 02/19/2018 1849   Sepsis Labs: @LABRCNTIP (procalcitonin:4,lacticidven:4)  ) Recent Results (from the past 240 hour(s))  Blood culture (routine x 2)     Status: None (Preliminary result)   Collection Time: 02/19/18  5:50 PM  Result Value Ref Range Status   Specimen Description BLOOD RIGHT ANTECUBITAL  Final   Special Requests   Final    BOTTLES DRAWN AEROBIC AND ANAEROBIC Blood Culture adequate volume   Culture  Setup Time   Final    GRAM POSITIVE COCCI IN CLUSTERS IN BOTH AEROBIC AND ANAEROBIC BOTTLES CRITICAL RESULT CALLED TO, READ BACK BY AND VERIFIED WITH: E SINCLAIR,PHARMD AT 1452 02/20/18 BY L BENFIELD    Culture   Final    GRAM POSITIVE COCCI CULTURE REINCUBATED FOR BETTER GROWTH Performed at [facility], [address] [place], [place] [address]    Report Status PENDING  Incomplete  Blood Culture ID Panel (Reflexed)     Status: Abnormal   Collection Time: 02/19/18  5:50 PM  Result Value Ref Range Status   Enterococcus species NOT DETECTED NOT DETECTED Final   Listeria monocytogenes NOT DETECTED NOT DETECTED Final   Staphylococcus species DETECTED (A) NOT DETECTED Final    Comment: Methicillin (oxacillin) susceptible coagulase negative staphylococcus. Possible blood culture contaminant (unless isolated from more than one blood culture draw  or clinical case suggests pathogenicity). No antibiotic treatment is indicated for blood  culture contaminants. CRITICAL RESULT CALLED TO, READ BACK BY AND VERIFIED WITH: E SINCLAIR,PHARMD AT 1452 02/20/18 BY L BENFIELD    Staphylococcus aureus NOT DETECTED NOT DETECTED Final   Methicillin resistance NOT DETECTED NOT DETECTED Final   Streptococcus species NOT DETECTED NOT DETECTED Final   Streptococcus agalactiae NOT DETECTED NOT DETECTED Final   Streptococcus pneumoniae NOT DETECTED NOT DETECTED Final   Streptococcus pyogenes NOT DETECTED NOT DETECTED Final   Acinetobacter baumannii NOT DETECTED NOT DETECTED Final   Enterobacteriaceae species NOT DETECTED NOT DETECTED Final   Enterobacter cloacae complex NOT DETECTED NOT DETECTED Final   Escherichia coli NOT DETECTED NOT DETECTED Final   Klebsiella oxytoca NOT DETECTED NOT DETECTED Final   Klebsiella pneumoniae NOT DETECTED NOT DETECTED Final   Proteus species NOT DETECTED NOT DETECTED Final   Serratia marcescens NOT DETECTED NOT DETECTED Final   Haemophilus influenzae NOT DETECTED NOT DETECTED Final   Neisseria meningitidis NOT DETECTED NOT DETECTED Final   Pseudomonas aeruginosa NOT DETECTED NOT DETECTED Final  Candida albicans NOT DETECTED NOT DETECTED Final   Candida glabrata NOT DETECTED NOT DETECTED Final   Candida krusei NOT DETECTED NOT DETECTED Final   Candida parapsilosis NOT DETECTED NOT DETECTED Final   Candida tropicalis NOT DETECTED NOT DETECTED Final    Comment: Performed at [facility], [address], [place], [place] [address]  Blood culture (routine x 2)     Status: None (Preliminary result)   Collection Time: 02/19/18  6:00 PM  Result Value Ref Range Status   Specimen Description BLOOD LEFT ANTECUBITAL  Final   Special Requests IN PEDIATRIC BOTTLE Blood Culture adequate volume  Final   Culture   Final    NO GROWTH < 24 HOURS Performed at [facility], [address] [place], [place] [address]     Report Status PENDING  Incomplete  Urine culture     Status: None   Collection Time: 02/19/18  6:49 PM  Result Value Ref Range Status   Specimen Description URINE, RANDOM  Final   Special Requests NONE  Final   Culture   Final    NO GROWTH Performed at [facility], [address], [place], [place] [address]    Report Status 02/21/2018 FINAL  Final         Radiology Studies: Dg Chest 2 View  Result Date: 02/19/2018 CLINICAL DATA:  Cough and fever EXAM: CHEST - 2 VIEW COMPARISON:  01/07/2017 FINDINGS: Mild left lower lobe airspace disease has developed since the prior study. This could be atelectasis or pneumonia. Remaining lungs are clear. No heart failure or effusion IMPRESSION: Mild left lower lobe atelectasis/pneumonia. Electronically Signed   By: Marlan Palau M.D.   On: 02/19/2018 14:43   Ct Head W Or Wo Contrast  Result Date: 02/19/2018 CLINICAL DATA:  Left eye pain.  Headache for 2 months. EXAM: CT HEAD WITHOUT CONTRAST CT ORBITS WITH CONTRAST TECHNIQUE: Multidetector CT imaging of the head was performed following the standard protocol before bolus injection of intravenous contrast. Multidetector CT imaging of the orbits were performed following the standard protocol after bolus administration of intravenous contrast. CONTRAST:  75mL ISOVUE-300 IOPAMIDOL (ISOVUE-300) INJECTION 61% COMPARISON:  CT of the orbits 11/21/2014.  Head CT 11/16/2012 FINDINGS: CT HEAD FINDINGS Brain: No evidence of acute infarction, hemorrhage, hydrocephalus, extra-axial collection or mass lesion/mass effect. Generalized atrophy since 2013 with mild lateral and third ventriculomegaly. Vascular: No hyperdense vessel. Skull: Negative CT ORBITS FINDINGS Orbits: The extraocular muscles are enlarged, mildly improved since 2015. Negative appearance of the globes. Symmetric negative lacrimal glands, optic nerve sheath complexes, and superior ophthalmic veins. Borderline bilateral proptosis. No inflammatory  changes. Visualized sinuses: Mild mucosal thickening in the paranasal sinuses. No acute sinusitis. Soft tissues: Negative IMPRESSION: Head CT: 1. No acute finding. 2. Generalized atrophy that has developed since 2013 comparison. Orbit CT: 1. No acute finding. 2. 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 The patient had iodine ablation of the thyroid in 2018. Electronically Signed   By: Marnee Spring M.D.   On: 02/19/2018 18:44   Ct Orbits W Contrast  Result Date: 02/19/2018 CLINICAL DATA:  Left eye pain.  Headache for 2 months. EXAM: CT HEAD WITHOUT CONTRAST CT ORBITS WITH CONTRAST TECHNIQUE: Multidetector CT imaging of the head was performed following the standard protocol before bolus injection of intravenous contrast. Multidetector CT imaging of the orbits were performed following the standard protocol after bolus administration of intravenous contrast. CONTRAST:  75mL ISOVUE-300 IOPAMIDOL (ISOVUE-300)  INJECTION 61% COMPARISON:  CT of the orbits 11/21/2014.  Head CT 11/16/2012 FINDINGS: CT HEAD FINDINGS Brain: No evidence of acute infarction, hemorrhage, hydrocephalus, extra-axial collection or mass lesion/mass effect. Generalized atrophy since 2013 with mild lateral and third ventriculomegaly. Vascular: No hyperdense vessel. Skull: Negative CT ORBITS FINDINGS Orbits: The extraocular muscles are enlarged, mildly improved since 2015. Negative appearance of the globes. Symmetric negative lacrimal glands, optic nerve sheath complexes, and superior ophthalmic veins. Borderline bilateral proptosis. No inflammatory changes. Visualized sinuses: Mild mucosal thickening in the paranasal sinuses. No acute sinusitis. Soft tissues: Negative IMPRESSION: Head CT: 1. No acute finding. 2. Generalized atrophy that has developed since 2013 comparison. Orbit CT: 1. No acute finding. 2. 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 The patient had iodine ablation of the thyroid in  2018. Electronically Signed   By: Marnee Spring M.D.   On: 02/19/2018 18:44        Scheduled Meds: . azithromycin  500 mg Oral Q24H  . enoxaparin (LOVENOX) injection  40 mg Subcutaneous Q24H  . levothyroxine  100 mcg Oral QAC breakfast  . mirtazapine  30 mg Oral QHS   Continuous Infusions: . sodium chloride 75 mL/hr at 02/21/18 0627  . cefTRIAXone (ROCEPHIN)  IV Stopped (02/21/18 0956)     LOS: 0 days    Time spent:    Zannie Cove, MD [facility] Page via [email or web address], password [email or web address] After 7PM please contact night-coverage  02/21/2018, 11:22 AM

## 2018-02-22 LAB — CULTURE, BLOOD (ROUTINE X 2): Special Requests: ADEQUATE

## 2018-02-22 MED ORDER — LEVOTHYROXINE SODIUM 100 MCG PO TABS: 100.0000 ug | ORAL_TABLET | Freq: Every day | ORAL | 0 refills | Status: DC

## 2018-02-22 MED ORDER — CEFPODOXIME PROXETIL 100 MG PO TABS: 100.0000 mg | ORAL_TABLET | Freq: Two times a day (BID) | ORAL | 0 refills | Status: DC

## 2018-02-22 NOTE — Progress Notes (Signed)
Patient was discharged home and patient understood discharge paperwork.  Vernona RiegerLaura RN

## 2018-02-22 NOTE — Progress Notes (Addendum)
Patient with discharge orders home today.  In to speak with patient, discussed [facility] and [facility] with [facility].  Offered choice to patient, [facility].  [facility]  Arranged for DME: Rolling walker to be delivered prior to discharge.    Patient requested referral to outpatient rehab facility for Physical therapy states has no permanent residence.  Offered choice and patient [facility] on [address] [place], [place]  Referral sent and attending notified. Discussed issue of NO PCP,  Offered choice of provider, selected [facility]; Call placed spoke with Arna Mediciora new patient appointment schedule and put on discharge instructions.  Yancey FlemingsKimberly R Becton, BSN, RN Nurse Case Sales executiveManager [place]

## 2018-02-22 NOTE — Evaluation (Signed)
Physical Therapy Evaluation Patient Details Name: Phillip Kemp MRN: [id number] DOB: 09/27/1961 Today's Date: 02/22/2018   History of Present Illness  Pt is a 57 y/o male admitted secondary to dizziness and syncope. Imaging revealed L lower lobe pneumona. CT of the head and orbits negative for acute abnormality. PMH includes graves disease, substance abuse, and L foot surgery.   Clinical Impression  Pt admitted secondary to problem above with deficits below. Pt limited in gait tolerance secondary to reports of lightheadedness; VSS throughout. Required min to min guard A for mobility with use of IV pole. Anticipate pt will progress well once lightheadedness controlled. Pt continues to be uncertain about d/c location, however, reports he will either be going to his sister's or back to his apartment. Will continue to follow acutely to maximize functional mobility independence and safety.     Follow Up Recommendations Home health PT;Supervision for mobility/OOB    Equipment Recommendations  Rolling walker with 5" wheels    Recommendations for Other Services       Precautions / Restrictions Precautions Precautions: Fall Precaution Comments: Pt reports multiple falls at home. Restrictions Weight Bearing Restrictions: No      Mobility  Bed Mobility Overal bed mobility: Independent                Transfers Overall transfer level: Needs assistance Equipment used: None Transfers: Sit to/from Stand Sit to Stand: Supervision         General transfer comment: Supervision for safety. No reports of dizziness upon sitting.   Ambulation/Gait Ambulation/Gait assistance: Min guard;Min assist Ambulation Distance (Feet): 50 Feet Assistive device: None Gait Pattern/deviations: Step-through pattern;Decreased stride length;Trunk flexed Gait velocity: Decreased  Gait velocity interpretation: Below normal speed for age/gender General Gait Details: Min to min guard assist for  steadying with use of IV pole. Reaching out for rail in hallway for steadying as well. Pt reporting increased light headedness which limited ambulation distance. Pt's BP at 152/82 mmHg and pulse at 61 bpm. Educated about use of RW at home.   Stairs            Wheelchair Mobility    Modified Rankin (Stroke Patients Only)       Balance Overall balance assessment: Needs assistance Sitting-balance support: No upper extremity supported;Feet supported Sitting balance-Leahy Scale: Good     Standing balance support: Single extremity supported Standing balance-Leahy Scale: Poor Standing balance comment: Reliant on UE support.                              Pertinent Vitals/Pain Pain Assessment: 0-10 Pain Score: 4  Pain Location: L eye  Pain Descriptors / Indicators: Sore Pain Intervention(s): Monitored during session;Limited activity within patient's tolerance;Repositioned    Home Living Family/patient expects to be discharged to:: Private residence Living Arrangements: Other relatives(sister)   Type of Home: Apartment Home Access: Level entry     Home Layout: One level Home Equipment: None Additional Comments: Does have an apartment, but reports he will be moving out soon. Plans to either go back to apartment or to sister's house.    Prior Function Level of Independence: Independent               Hand Dominance        Extremity/Trunk Assessment   Upper Extremity Assessment Upper Extremity Assessment: RUE deficits/detail;LUE deficits/detail RUE Deficits / Details: Limited ROM in UEs at baseline. Held elbows in flexion. LUE Deficits /  Details: Limited ROM in UEs at baseline. Held elbows in flexion.    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  LLE Sensation: decreased light touch       Communication   Communication: No  difficulties  Cognition Arousal/Alertness: Awake/alert Behavior During Therapy: WFL for tasks assessed/performed Overall Cognitive Status: No family/caregiver present to determine baseline cognitive functioning                                        General Comments      Exercises     Assessment/Plan    PT Assessment Patient needs continued PT services  PT Problem List Decreased strength;Decreased activity tolerance;Decreased balance;Decreased mobility;Decreased range of motion;Decreased knowledge of use of DME;Decreased knowledge of precautions;Pain       PT Treatment Interventions DME instruction;Gait training;Therapeutic activities;Functional mobility training;Therapeutic exercise;Balance training;Neuromuscular re-education;Patient/family education    PT Goals (Current goals can be found in the Care Plan section)  Acute Rehab PT Goals Patient Stated Goal: to feel better  PT Goal Formulation: With patient Time For Goal Achievement: 03/08/18 Potential to Achieve Goals: Good    Frequency Min 3X/week   Barriers to discharge        Co-evaluation               AM-PAC PT "6 Clicks" Daily Activity  Outcome Measure Difficulty turning over in bed (including adjusting bedclothes, sheets and blankets)?: None Difficulty moving from lying on back to sitting on the side of the bed? : None Difficulty sitting down on and standing up from a chair with arms (e.g., wheelchair, bedside commode, etc,.)?: A Little Help needed moving to and from a bed to chair (including a wheelchair)?: A Little Help needed walking in hospital room?: A Little Help needed climbing 3-5 steps with a railing? : A Lot 6 Click Score: 19    End of Session Equipment Utilized During Treatment: Gait belt Activity Tolerance: Treatment limited secondary to medical complications (Comment)(lightheadedness ) Patient left: in bed;with call bell/phone within reach;with bed alarm set Nurse  Communication: Mobility status PT Visit Diagnosis: Unsteadiness on feet (R26.81);History of falling (Z91.81)    Time: [id number] PT Time Calculation (min) (ACUTE ONLY): 26 min   Charges:   PT Evaluation $PT Eval Low Complexity: 1 Low PT Treatments $Gait Training: 8-22 mins   PT G Codes:        Gladys DammeBrittany Areanna Gengler, PT, DPT  Acute Rehabilitation Services  Pager: [phone]   Lehman PromBrittany S Rhyatt Muska 02/22/2018, 11:54 AM

## 2018-02-22 NOTE — Discharge Summary (Signed)
Physician Discharge Summary  Phillip Kemp ZOX:[id number] DOB: 21-Jan-1961 DOA: 02/19/2018  PCP: Phillip Speak, Phillip Kemp  Admit date: 02/19/2018 Discharge date: 02/22/2018  Time spent: 35 minutes  Recommendations for Outpatient Follow-up:  [place] [facility] in 1 week Endocrine Phillip Kemp in 1 month  Discharge Diagnoses:  Principal Problem:   Community acquired pneumonia of left lower lobe of lung (HCC)   Hypopthyroidism   Substance abuse (HCC)   Graves' disease   CAP (community acquired pneumonia)   Discharge Condition: stable  Diet recommendation: low sodium  Filed Weights   02/19/18 2323 02/20/18 1957  Weight: 68.5 kg (151 lb 0.2 oz) 68 kg (149 lb 14.6 oz)    History of present illness:  Phillip Kemp a 57 y.o.malewith medical history significant ofpolysubstance abuse, depression, hyperthyroidism s/p ablation last year. Patient presents to the ED with c/o dizziness, syncope, headaches, fevers, cough. Started developing flu like symptoms 2-3 days ago. Cough productive  Hospital Course:   Community acquired pneumonia of left lower lobe of lung (HCC) -Treated with Rocephin and azithromycin, Influenza PCR negative -improved and stable now, transitioned to [facility] for 2days at discharge  Mild alcohol withdrawal -Treated with Ativan per CIWA protocol -improved now -Counseled -Social work consulted, resources given  1/ 2 blood cultures with coagulase-negative staph -Consistent with contamination, no antibiotic therapy indicated  Hypothyroidism -Following radioactive iodine 2 for Graves' disease -Started Synthroid,  -Follow-up with Phillip Kemp  History of Graves' disease - status post radioactive iodine and Tapazole -Now with hypothyroidism, TSH was 15.2 and T4 was 0.5 this admission, started Synthroid, recheck levels in 69month -Treatment as above  Homelessness/polysubstance abuse -UDS still pending, h/o Cannabis and Cocaine  abuse -Social work consulted -Pt reports that he will be able to go and stay in his sister's appartment    Discharge Exam: Vitals:   02/22/18 0421 02/22/18 0900  BP: 105/79 112/78  Pulse: 67 65  Resp: 16 16  Temp: 97.9 F (36.6 C) 98.2 F (36.8 C)  SpO2: 99% 100%    General: AAOx2 Cardiovascular: S1S2/RRR Respiratory: CTAB  Discharge Instructions   Discharge Instructions    Care order/instruction   Complete by:  As directed    DO Not send him home before PT eval today   Diet - low sodium heart healthy   Complete by:  As directed    Increase activity slowly   Complete by:  As directed      Allergies as of 02/22/2018      Reactions   Bee Venom Anaphylaxis      Medication List    STOP taking these medications   BENADRYL 25 mg capsule Generic drug:  diphenhydrAMINE   methimazole 10 MG tablet Commonly known as:  TAPAZOLE   mirtazapine 30 MG tablet Commonly known as:  REMERON   propranolol ER 120 MG 24 hr capsule Commonly known as:  INDERAL LA     TAKE these medications   cefpodoxime 100 MG tablet Commonly known as:  VANTIN Take 1 tablet (100 mg total) by mouth 2 (two) times daily. For 2days   ferrous sulfate 325 (65 FE) MG tablet Take 325 mg by mouth daily with breakfast.   hydrOXYzine 25 MG tablet Commonly known as:  ATARAX/VISTARIL Take 1 tablet (25 mg total) by mouth every 6 (six) hours as needed for anxiety (sleep). What changed:  reasons to take this   ibuprofen 200 MG tablet Commonly known as:  ADVIL,MOTRIN Take 200-400 mg by mouth every  6 (six) hours as needed (for pain or headaches).   levothyroxine 100 MCG tablet Commonly known as:  SYNTHROID, LEVOTHROID Take 1 tablet (100 mcg total) by mouth daily before breakfast. Start taking on:  02/23/2018   TRAZODONE HCL PO Take 1 tablet by mouth at bedtime as needed (for sleep).            Durable Medical Equipment  (From admission, onward)        Start     Ordered   02/22/18 1118   For home use only DME Walker rolling  Once    Question:  Patient needs a walker to treat with the following condition  Answer:  Generalized weakness   02/22/18 1118     Allergies  Allergen Reactions  . Bee Venom Anaphylaxis   Follow-up Information    Phillip Littler, MD. Schedule an appointment as soon as possible for a visit in 1 month(s).   Specialty:  Endocrinology Contact information: [address] [place] [address] [phone]        [facility] Follow up.   Why:  Schedule an appointment as soon as possible for a visit in 1 week(s) Contact information: Phillip Kemp Phillip Kemp [phone]       [facility] Follow up.   Specialty:  Rehabilitation Why:  will call you to set-up appointment Contact information: [address] [id number] mc [place] [address] [phone]           The results of significant diagnostics from this hospitalization (including imaging, microbiology, ancillary and laboratory) are listed below for reference.    Significant Diagnostic Studies: Dg Chest 2 View  Result Date: 02/19/2018 CLINICAL DATA:  Cough and fever EXAM: CHEST - 2 VIEW COMPARISON:  01/07/2017 FINDINGS: Mild left lower lobe airspace disease has developed since the prior study. This could be atelectasis or pneumonia. Remaining lungs are clear. No heart failure or effusion IMPRESSION: Mild left lower lobe atelectasis/pneumonia. Electronically Signed   By: Phillip Kemp M.D.   On: 02/19/2018 14:43   Ct Head W Or Wo Contrast  Result Date: 02/19/2018 CLINICAL DATA:  Left eye pain.  Headache for 2 months. EXAM: CT HEAD WITHOUT CONTRAST CT ORBITS WITH CONTRAST TECHNIQUE: Multidetector CT imaging of the head was performed following the standard protocol before bolus injection of intravenous contrast. Multidetector CT imaging of the orbits were  performed following the standard protocol after bolus administration of intravenous contrast. CONTRAST:  75mL ISOVUE-300 IOPAMIDOL (ISOVUE-300) INJECTION 61% COMPARISON:  CT of the orbits 11/21/2014.  Head CT 11/16/2012 FINDINGS: CT HEAD FINDINGS Brain: No evidence of acute infarction, hemorrhage, hydrocephalus, extra-axial collection or mass lesion/mass effect. Generalized atrophy since 2013 with mild lateral and third ventriculomegaly. Vascular: No hyperdense vessel. Skull: Negative CT ORBITS FINDINGS Orbits: The extraocular muscles are enlarged, mildly improved since 2015. Negative appearance of the globes. Symmetric negative lacrimal glands, optic nerve sheath complexes, and superior ophthalmic veins. Borderline bilateral proptosis. No inflammatory changes. Visualized sinuses: Mild mucosal thickening in the paranasal sinuses. No acute sinusitis. Soft tissues: Negative IMPRESSION: Head CT: 1. No acute finding. 2. Generalized atrophy that has developed since 2013 comparison. Orbit CT: 1. No acute finding. 2. 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 The patient had iodine ablation of the thyroid in 2018. Electronically Signed   By: Marnee Spring M.D.   On: 02/19/2018 18:44   Ct Orbits W Contrast  Result Date:  02/19/2018 CLINICAL DATA:  Left eye pain.  Headache for 2 months. EXAM: CT HEAD WITHOUT CONTRAST CT ORBITS WITH CONTRAST TECHNIQUE: Multidetector CT imaging of the head was performed following the standard protocol before bolus injection of intravenous contrast. Multidetector CT imaging of the orbits were performed following the standard protocol after bolus administration of intravenous contrast. CONTRAST:  75mL ISOVUE-300 IOPAMIDOL (ISOVUE-300) INJECTION 61% COMPARISON:  CT of the orbits 11/21/2014.  Head CT 11/16/2012 FINDINGS: CT HEAD FINDINGS Brain: No evidence of acute infarction, hemorrhage, hydrocephalus, extra-axial collection or mass lesion/mass effect. Generalized  atrophy since 2013 with mild lateral and third ventriculomegaly. Vascular: No hyperdense vessel. Skull: Negative CT ORBITS FINDINGS Orbits: The extraocular muscles are enlarged, mildly improved since 2015. Negative appearance of the globes. Symmetric negative lacrimal glands, optic nerve sheath complexes, and superior ophthalmic veins. Borderline bilateral proptosis. No inflammatory changes. Visualized sinuses: Mild mucosal thickening in the paranasal sinuses. No acute sinusitis. Soft tissues: Negative IMPRESSION: Head CT: 1. No acute finding. 2. Generalized atrophy that has developed since 2013 comparison. Orbit CT: 1. No acute finding. 2. 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 The patient had iodine ablation of the thyroid in 2018. Electronically Signed   By: Marnee Spring M.D.   On: 02/19/2018 18:44    Microbiology: Recent Results (from the past 240 hour(s))  Blood culture (routine x 2)     Status: Abnormal   Collection Time: 02/19/18  5:50 PM  Result Value Ref Range Status   Specimen Description BLOOD RIGHT ANTECUBITAL  Final   Special Requests   Final    BOTTLES DRAWN AEROBIC AND ANAEROBIC Blood Culture adequate volume   Culture  Setup Time   Final    GRAM POSITIVE COCCI IN CLUSTERS IN BOTH AEROBIC AND ANAEROBIC BOTTLES CRITICAL RESULT CALLED TO, READ BACK BY AND VERIFIED WITH: E SINCLAIR,PHARMD AT 1452 02/20/18 BY L BENFIELD    Culture (A)  Final    STAPHYLOCOCCUS SPECIES (COAGULASE NEGATIVE) THE SIGNIFICANCE OF ISOLATING THIS ORGANISM FROM A SINGLE SET OF BLOOD CULTURES WHEN MULTIPLE SETS ARE DRAWN IS UNCERTAIN. PLEASE NOTIFY THE MICROBIOLOGY DEPARTMENT WITHIN ONE WEEK IF SPECIATION AND SENSITIVITIES ARE REQUIRED. Performed at [facility], [address] [place], [place] [address]    Report Status 02/22/2018 FINAL  Final  Blood Culture ID Panel (Reflexed)     Status: Abnormal   Collection Time: 02/19/18  5:50 PM  Result Value Ref Range Status    Enterococcus species NOT DETECTED NOT DETECTED Final   Listeria monocytogenes NOT DETECTED NOT DETECTED Final   Staphylococcus species DETECTED (A) NOT DETECTED Final    Comment: Methicillin (oxacillin) susceptible coagulase negative staphylococcus. Possible blood culture contaminant (unless isolated from more than one blood culture draw or clinical case suggests pathogenicity). No antibiotic treatment is indicated for blood  culture contaminants. CRITICAL RESULT CALLED TO, READ BACK BY AND VERIFIED WITH: E SINCLAIR,PHARMD AT 1452 02/20/18 BY L BENFIELD    Staphylococcus aureus NOT DETECTED NOT DETECTED Final   Methicillin resistance NOT DETECTED NOT DETECTED Final   Streptococcus species NOT DETECTED NOT DETECTED Final   Streptococcus agalactiae NOT DETECTED NOT DETECTED Final   Streptococcus pneumoniae NOT DETECTED NOT DETECTED Final   Streptococcus pyogenes NOT DETECTED NOT DETECTED Final   Acinetobacter baumannii NOT DETECTED NOT DETECTED Final   Enterobacteriaceae species NOT DETECTED NOT DETECTED Final   Enterobacter cloacae complex NOT DETECTED NOT DETECTED Final   Escherichia coli NOT DETECTED NOT DETECTED Final   Klebsiella  oxytoca NOT DETECTED NOT DETECTED Final   Klebsiella pneumoniae NOT DETECTED NOT DETECTED Final   Proteus species NOT DETECTED NOT DETECTED Final   Serratia marcescens NOT DETECTED NOT DETECTED Final   Haemophilus influenzae NOT DETECTED NOT DETECTED Final   Neisseria meningitidis NOT DETECTED NOT DETECTED Final   Pseudomonas aeruginosa NOT DETECTED NOT DETECTED Final   Candida albicans NOT DETECTED NOT DETECTED Final   Candida glabrata NOT DETECTED NOT DETECTED Final   Candida krusei NOT DETECTED NOT DETECTED Final   Candida parapsilosis NOT DETECTED NOT DETECTED Final   Candida tropicalis NOT DETECTED NOT DETECTED Final    Comment: Performed at [facility], [address] [place], [place] [address]  Blood culture (routine x 2)     Status: None  (Preliminary result)   Collection Time: 02/19/18  6:00 PM  Result Value Ref Range Status   Specimen Description BLOOD LEFT ANTECUBITAL  Final   Special Requests IN PEDIATRIC BOTTLE Blood Culture adequate volume  Final   Culture   Final    NO GROWTH 3 DAYS Performed at [facility], [address] [place], [place] [address]    Report Status PENDING  Incomplete  Urine culture     Status: None   Collection Time: 02/19/18  6:49 PM  Result Value Ref Range Status   Specimen Description URINE, RANDOM  Final   Special Requests NONE  Final   Culture   Final    NO GROWTH Performed at [facility], [address] [place], [place] [address]    Report Status 02/21/2018 FINAL  Final  Culture, sputum-assessment     Status: None   Collection Time: 02/21/18  9:49 AM  Result Value Ref Range Status   Specimen Description SPUTUM  Final   Special Requests NONE  Final   Sputum evaluation   Final    Sputum specimen not acceptable for testing.  Please recollect.   Gram Stain Report Called to,Read Back By and Verified With: L BENNETT,RN AT 1226 02/21/18 BY L BENFIELD Performed at [facility], [address] [place], [place] [address]    Report Status 02/21/2018 FINAL  Final     Labs: Basic Metabolic Panel: Recent Labs  Lab 02/19/18 1400 02/21/18 0601  NA 140 141  K 4.0 3.8  CL 103 109  CO2 25 23  GLUCOSE 77 89  BUN 8 8  CREATININE 0.74 0.86  CALCIUM 9.4 8.7*   Liver Function Tests: No results for input(s): AST, ALT, ALKPHOS, BILITOT, PROT, ALBUMIN in the last 168 hours. No results for input(s): LIPASE, AMYLASE in the last 168 hours. No results for input(s): AMMONIA in the last 168 hours. CBC: Recent Labs  Lab 02/19/18 1400 02/21/18 0601  WBC 10.1 5.3  HGB 12.0* 12.2*  HCT 36.2* 37.5*  MCV 90.7 93.3  PLT 192 269   Cardiac Enzymes: No results for input(s): CKTOTAL, CKMB, CKMBINDEX, TROPONINI in the last 168 hours. BNP: BNP (last 3 results) No results for  input(s): BNP in the last 8760 hours.  ProBNP (last 3 results) No results for input(s): PROBNP in the last 8760 hours.  CBG: No results for input(s): GLUCAP in the last 168 hours.     Signed:  Zannie CovePreetha Freja Faro MD.  [facility] 02/22/2018, 1:55 PM

## 2018-02-24 LAB — CULTURE, BLOOD (ROUTINE X 2)
CULTURE: NO GROWTH
Special Requests: ADEQUATE

## 2018-03-01 ENCOUNTER — Ambulatory Visit: Payer: Medicare Other | Admitting: Internal Medicine

## 2018-03-16 ENCOUNTER — Ambulatory Visit (INDEPENDENT_AMBULATORY_CARE_PROVIDER_SITE_OTHER): Payer: Medicare HMO | Admitting: Physician Assistant

## 2018-10-01 ENCOUNTER — Telehealth: Payer: Self-pay | Admitting: Endocrinology

## 2018-10-01 NOTE — Telephone Encounter (Signed)
Patient seen in the [facility] ER/[facility] for pneumonia but has abnormal thyroid function with TSH 23, currently cannot see this on care everywhere, needs follow-up.  Please schedule ASAP and can see Dr. Gust Brooms next week.  Patient was told to call for appointment

## 2018-10-17 NOTE — Telephone Encounter (Signed)
Patient is supposed to be seen in December but needs an appointment within the next couple of weeks for follow-up, can see Dr. Kathie Rhodes.  Sooner if needed

## 2018-12-10 ENCOUNTER — Ambulatory Visit: Payer: Medicare HMO | Admitting: Endocrinology

## 2018-12-16 ENCOUNTER — Encounter: Payer: Self-pay | Admitting: Endocrinology

## 2018-12-16 ENCOUNTER — Ambulatory Visit: Payer: Medicare HMO | Admitting: Endocrinology

## 2018-12-16 DIAGNOSIS — Z0289 Encounter for other administrative examinations: Secondary | ICD-10-CM

## 2018-12-22 ENCOUNTER — Telehealth: Payer: Self-pay | Admitting: Endocrinology

## 2018-12-22 NOTE — Telephone Encounter (Signed)
Patient dismissed from [facility] by Reather Littler MD, effective 12/16/18. Dismissal letter sent out by 1st class mail. LM

## 2021-10-08 ENCOUNTER — Other Ambulatory Visit: Payer: Self-pay

## 2021-10-08 ENCOUNTER — Ambulatory Visit (INDEPENDENT_AMBULATORY_CARE_PROVIDER_SITE_OTHER): Payer: Medicare Other

## 2021-10-08 ENCOUNTER — Encounter: Payer: Self-pay | Admitting: Podiatry

## 2021-10-08 ENCOUNTER — Other Ambulatory Visit: Payer: Self-pay | Admitting: Podiatry

## 2021-10-08 ENCOUNTER — Ambulatory Visit (INDEPENDENT_AMBULATORY_CARE_PROVIDER_SITE_OTHER): Payer: Medicare Other | Admitting: Podiatry

## 2021-10-08 DIAGNOSIS — I739 Peripheral vascular disease, unspecified: Secondary | ICD-10-CM | POA: Diagnosis not present

## 2021-10-08 DIAGNOSIS — M79672 Pain in left foot: Secondary | ICD-10-CM | POA: Diagnosis not present

## 2021-10-08 DIAGNOSIS — F1020 Alcohol dependence, uncomplicated: Secondary | ICD-10-CM

## 2021-10-08 DIAGNOSIS — M79671 Pain in right foot: Secondary | ICD-10-CM

## 2021-10-08 NOTE — Progress Notes (Signed)
  Subjective:  Patient ID: Phillip Kemp, male    DOB: 09/06/1961,   MRN: [id number]  Chief Complaint  Patient presents with   Foot Pain    Bilateral foot swelling and pain for about 1 year. PCP stated it maybe a circulation problem. Patient wants a second opinion     60 y.o. male presents for bilateral foot pain and swelling. Relates right is worse than left. States it has been on and off for about a year. States the pain is worse when he is sitting and has his feet elevated. Also relates pain when walking after certain distances. Denies any vascular issues in the past but does have a history of lawn mower accident when he was 14 that resulted in amputation of his first ray on his left foot.  . Denies any other pedal complaints. Denies n/v/f/c.   Past Medical History:  Diagnosis Date   Acute alcoholic pancreatitis 09/08/2012   Arthritis    Chronic blood loss anemia 09/09/2012   Depression    Gout    Hyperthyroidism    Rectal bleeding 09/08/2012   Substance abuse (HCC)    Cocaine, marijuana, and alcohol   Suicidal ideation 09/09/2012    Objective:  Physical Exam: Vascular: DP/PT pulses 0/4 bilateral. CFT <5 seconds. No hair growth on digits. Bilateral lower extremity edema  Skin. No lacerations or abrasions bilateral feet.  Musculoskeletal: MMT 5/5 bilateral lower extremities in DF, PF, Inversion and Eversion. Deceased ROM in DF of ankle joint. 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  Neurological: Sensation intact to light touch.   Assessment:   1. Peripheral arterial disease (HCC)   2. Alcohol use disorder, severe, dependence (HCC)      Plan:  Patient was evaluated and treated and all questions answered. Discussed peripheral arterial disease and neuropathy and etiology as well as treatment with patient.  Radiographs reviewed and discussed with patient. No acute fractures or dislocation. 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  -Discussed and  educated patient on  foot care, especially with  regards to the vascular, neurological and musculoskeletal systems.  -Discussed supportive shoes at all times and checking feet regularly.  -Concern for blood flow bilaterally. Order for ABIs sent. Will likely need a referral to vascular surgery.  -Follow-up with PCP for prescription management of gabapentin.  -Patient to return in 3 months for follow-up evaluation.    Louann Sjogren, DPM

## 2021-10-15 ENCOUNTER — Encounter (HOSPITAL_COMMUNITY): Payer: Self-pay

## 2021-10-30 ENCOUNTER — Ambulatory Visit: Payer: Medicare Other | Admitting: Podiatry

## 2021-11-04 ENCOUNTER — Encounter: Payer: Self-pay | Admitting: Podiatry

## 2021-11-04 ENCOUNTER — Ambulatory Visit (INDEPENDENT_AMBULATORY_CARE_PROVIDER_SITE_OTHER): Payer: Medicare Other | Admitting: Podiatry

## 2021-11-04 ENCOUNTER — Other Ambulatory Visit: Payer: Self-pay

## 2021-11-04 DIAGNOSIS — F1020 Alcohol dependence, uncomplicated: Secondary | ICD-10-CM | POA: Diagnosis not present

## 2021-11-04 DIAGNOSIS — Z899 Acquired absence of limb, unspecified: Secondary | ICD-10-CM | POA: Diagnosis not present

## 2021-11-04 DIAGNOSIS — M79674 Pain in right toe(s): Secondary | ICD-10-CM

## 2021-11-04 DIAGNOSIS — M79675 Pain in left toe(s): Secondary | ICD-10-CM | POA: Diagnosis not present

## 2021-11-04 DIAGNOSIS — B351 Tinea unguium: Secondary | ICD-10-CM | POA: Diagnosis not present

## 2021-11-04 DIAGNOSIS — I739 Peripheral vascular disease, unspecified: Secondary | ICD-10-CM

## 2021-11-04 NOTE — Progress Notes (Signed)
  Subjective:  Patient ID: Phillip Kemp, male    DOB: 03/06/1961,   MRN: [id number]  Chief Complaint  Patient presents with   Foot Pain    My feet are doing better and the swelling has gone down and I got new fluid pills and I did not go and get the vascular knew nothing about it     60 y.o. male presents for follow-up of bilateral foot swelling. Never heard about his vascular studies. Relates the swelling has improved as he has been on furosemide. States the pain is still there and has been alternating between elevation and keeping his feet down.  Relates right is worse than left. States it has been on and off for about a year. States the pain is worse when he is sitting and has his feet elevated. Also relates pain when walking after certain distances. Denies any vascular issues in the past but does have a history of lawn mower accident when he was 14 that resulted in amputation of his first ray on his left foot.  . Denies any other pedal complaints. Denies n/v/f/c.   Past Medical History:  Diagnosis Date   Acute alcoholic pancreatitis 09/08/2012   Arthritis    Chronic blood loss anemia 09/09/2012   Depression    Gout    Hyperthyroidism    Rectal bleeding 09/08/2012   Substance abuse (HCC)    Cocaine, marijuana, and alcohol   Suicidal ideation 09/09/2012    Objective:  Physical Exam: Vascular: DP/PT pulses 0/4 bilateral. CFT <5 seconds. No hair growth on digits. Bilateral lower extremity edema  Skin. No lacerations or abrasions bilateral feet. Right foot nails 1-5 and left foot 3-5 are thickened elongated and with subungal debris.  Musculoskeletal: MMT 5/5 bilateral lower extremities in DF, PF, Inversion and Eversion. Deceased ROM in DF of ankle joint. 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  Neurological: Sensation intact to light touch.   Assessment:   1. Peripheral arterial disease (HCC)   2. Alcohol use disorder, severe, dependence (HCC)   3. Onychomycosis   4.  Pain due to onychomycosis of toenails of both feet   5. History of amputation      Plan:  Patient was evaluated and treated and all questions answered. Discussed peripheral arterial disease and neuropathy and etiology as well as treatment with patient.  Radiographs reviewed and discussed with patient. No acute fractures or dislocation. 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  -Discussed and educated patient on  foot care, especially with  regards to the vascular, neurological and musculoskeletal systems.  -Discussed supportive shoes at all times and checking feet regularly.  -Concern for blood flow bilaterally. Order for ABIs sent. Referral to vascular surgery placed as well.  -Mechanically debrided nails today without incident.  -Patient to return in 3 months for follow-up evaluation.    Louann Sjogren, DPM

## 2021-11-06 ENCOUNTER — Telehealth: Payer: Self-pay | Admitting: *Deleted

## 2021-11-06 NOTE — Telephone Encounter (Signed)
Tried contacting the patient again , no answer, could not leave a voice message.

## 2021-11-06 NOTE — Telephone Encounter (Signed)
Para March w/ [facility] [phone] been trying to reach patient to schedule this stat order, tried on Tues. ,Wed.,no answer,could not leave a voice message. I tried to call patient ,both numbers listed,left vmessage for call back on his sister's contact #.

## 2021-11-22 ENCOUNTER — Encounter (HOSPITAL_COMMUNITY): Payer: Self-pay

## 2021-11-27 ENCOUNTER — Emergency Department (HOSPITAL_COMMUNITY): Payer: Medicare Other

## 2021-11-27 ENCOUNTER — Other Ambulatory Visit: Payer: Self-pay

## 2021-11-27 ENCOUNTER — Emergency Department (HOSPITAL_COMMUNITY)
Admission: EM | Admit: 2021-11-27 | Discharge: 2021-11-27 | Disposition: A | Discharge status: Home / Self Care | Payer: Medicare Other | Attending: Emergency Medicine | Admitting: Emergency Medicine

## 2021-11-27 ENCOUNTER — Encounter (HOSPITAL_COMMUNITY): Payer: Self-pay

## 2021-11-27 DIAGNOSIS — R1012 Left upper quadrant pain: Secondary | ICD-10-CM

## 2021-11-27 DIAGNOSIS — Z79899 Other long term (current) drug therapy: Secondary | ICD-10-CM | POA: Insufficient documentation

## 2021-11-27 DIAGNOSIS — R600 Localized edema: Secondary | ICD-10-CM | POA: Insufficient documentation

## 2021-11-27 DIAGNOSIS — Z87891 Personal history of nicotine dependence: Secondary | ICD-10-CM | POA: Diagnosis not present

## 2021-11-27 LAB — CBC WITH DIFFERENTIAL/PLATELET
Abs Immature Granulocytes: 0.03 10*3/uL (ref 0.00–0.07)
Basophils Absolute: 0.1 10*3/uL (ref 0.0–0.1)
Basophils Relative: 1 %
Eosinophils Absolute: 0.1 10*3/uL (ref 0.0–0.5)
Eosinophils Relative: 1 %
HCT: 38.5 % — ABNORMAL LOW (ref 39.0–52.0)
Hemoglobin: 12.4 g/dL — ABNORMAL LOW (ref 13.0–17.0)
Immature Granulocytes: 0 %
Lymphocytes Relative: 19 %
Lymphs Abs: 1.5 10*3/uL (ref 0.7–4.0)
MCH: 29.5 pg (ref 26.0–34.0)
MCHC: 32.2 g/dL (ref 30.0–36.0)
MCV: 91.4 fL (ref 80.0–100.0)
Monocytes Absolute: 0.5 10*3/uL (ref 0.1–1.0)
Monocytes Relative: 6 %
Neutro Abs: 5.8 10*3/uL (ref 1.7–7.7)
Neutrophils Relative %: 73 %
Platelets: 160 10*3/uL (ref 150–400)
RBC: 4.21 MIL/uL — ABNORMAL LOW (ref 4.22–5.81)
RDW: 14.6 % (ref 11.5–15.5)
WBC: 7.9 10*3/uL (ref 4.0–10.5)
nRBC: 0 % (ref 0.0–0.2)

## 2021-11-27 LAB — COMPREHENSIVE METABOLIC PANEL
ALT: 16 U/L (ref 0–44)
AST: 28 U/L (ref 15–41)
Albumin: 4 g/dL (ref 3.5–5.0)
Alkaline Phosphatase: 62 U/L (ref 38–126)
Anion gap: 9 (ref 5–15)
BUN: 13 mg/dL (ref 8–23)
CO2: 26 mmol/L (ref 22–32)
Calcium: 9.1 mg/dL (ref 8.9–10.3)
Chloride: 106 mmol/L (ref 98–111)
Creatinine, Ser: 1.18 mg/dL (ref 0.61–1.24)
GFR, Estimated: >60 mL/min (ref >60)
Glucose, Bld: 84 mg/dL (ref 70–99)
Potassium: 4 mmol/L (ref 3.5–5.1)
Sodium: 141 mmol/L (ref 135–145)
Total Bilirubin: 0.5 mg/dL (ref 0.3–1.2)
Total Protein: 7.2 g/dL (ref 6.5–8.1)

## 2021-11-27 LAB — RAPID URINE DRUG SCREEN, HOSP PERFORMED
Amphetamines: NOT DETECTED
Barbiturates: NOT DETECTED
Benzodiazepines: NOT DETECTED
Cocaine: NOT DETECTED
Opiates: NOT DETECTED
Tetrahydrocannabinol: POSITIVE — AB

## 2021-11-27 LAB — URINALYSIS, ROUTINE W REFLEX MICROSCOPIC
Bilirubin Urine: NEGATIVE
Glucose, UA: NEGATIVE mg/dL
Hgb urine dipstick: NEGATIVE
Ketones, ur: NEGATIVE mg/dL
Leukocytes,Ua: NEGATIVE
Nitrite: NEGATIVE
Protein, ur: NEGATIVE mg/dL
Specific Gravity, Urine: 1.025 (ref 1.005–1.030)
pH: 5.5 (ref 5.0–8.0)

## 2021-11-27 LAB — PROTIME-INR
INR: 1 (ref 0.8–1.2)
Prothrombin Time: 13.2 seconds (ref 11.4–15.2)

## 2021-11-27 LAB — BRAIN NATRIURETIC PEPTIDE: B Natriuretic Peptide: 6 pg/mL (ref 0.0–100.0)

## 2021-11-27 LAB — TROPONIN I (HIGH SENSITIVITY): Troponin I (High Sensitivity): 5 ng/L (ref <18)

## 2021-11-27 MED ORDER — IOHEXOL 350 MG/ML SOLN
80.0000 mL | Freq: Once | INTRAVENOUS | Status: AC | PRN
  Administered 2021-11-27: 20:00:00 80 mL via INTRAVENOUS

## 2021-11-27 NOTE — ED Triage Notes (Addendum)
Patient arrived by [facility] from doctors office for 2 weeks of leg swelling and abdominal pain, sent for fluid retention. Alert and oriented. Patient states that the pain worse when he bends over and states all isolated to LUQ. Denies ETOH., nonsmoker. Reports some diarrhea with same

## 2021-11-27 NOTE — ED Notes (Signed)
Pt verbalized understanding of d/c instructions, meds, and followup care. Denies questions. VSS, no distress noted. Steady on feet in room. Assisted to wheelchair and out to lobby. Family called for transport and snack given.

## 2021-11-27 NOTE — ED Notes (Signed)
Back from CT

## 2021-11-27 NOTE — ED Provider Notes (Signed)
Emergency Medicine Provider Triage Evaluation Note  Phillip Kemp , a 60 y.o. male  was evaluated in triage.  Pt complains of 2 weeks of leg swelling and abdominal pain.  He states he was seen at the doctor's office and sent here for 2 weeks of leg swelling and abdominal pain.  He states he has been taking Lasix.  He denies any known liver issues.  Over the past few days he has had constant left upper abdominal/lower chest pain.  No fevers  Review of Systems  Positive: Abdominal pain Negative: Fevers  Physical Exam  BP 132/80 (BP Location: Left Arm)    Pulse 81    Temp 98.4 F (36.9 C) (Oral)    Resp 17    SpO2 97%  Gen:   Awake, no distress   Resp:  Normal effort  MSK:   Moves extremities without difficulty  Other:  Abdomen is swollen, firm but non tender.  Legs with 3+ pitting edema.   Medical Decision Making  Medically screening exam initiated at 12:31 PM.  Appropriate orders placed.  DARALD UZZLE was informed that the remainder of the evaluation will be completed by another provider, this initial triage assessment does not replace that evaluation, and the importance of remaining in the ED until their evaluation is complete.     Cristina Gong, PA-C 11/27/21 1233    Wynetta Fines, MD 11/29/21 [phone]

## 2021-11-27 NOTE — ED Notes (Signed)
Patient transported to CT 

## 2021-11-27 NOTE — ED Provider Notes (Signed)
[facility] EMERGENCY DEPARTMENT Provider Note   CSN: [id number] Arrival date & time: 11/27/21  1110     History CC: Paresthesias   Phillip Kemp is a 60 y.o. male presenting to the emergency department with abdominal pain.  The patient reports gradual onset of left upper quadrant abdominal pain "a few weeks ago".  He reports he has had loose daily bowel movements.  He denies blood in his stool.  He does feel that there is been swelling in his legs as well as his abdomen.  The pain waxes and wanes, sometimes goes away completely, but seems to be worse when he bends forward.  He reports yesterday he had an attack of pain that was so severe that he "almost fell out".  It is always localized in the same general spot on the left upper side.  He denies any falls or trauma.  He denies any history of abdominal surgery.  He reports that he called his PCPs office who advised that he come into the ER for evaluation.  HPI     Past Medical History:  Diagnosis Date   Acute alcoholic pancreatitis [id number]   Arthritis    Chronic blood loss anemia 09/09/2012   Depression    Gout    Hyperthyroidism    Rectal bleeding 09/08/2012   Substance abuse ([place])    Cocaine, marijuana, and alcohol   Suicidal ideation 09/09/2012    Patient Active Problem List   Diagnosis Date Noted   CAP (community acquired pneumonia) 02/21/2018   Community acquired pneumonia of left lower lobe of lung 02/19/2018   MDD (major depressive disorder), recurrent severe, without psychosis ([place]) 01/18/2016   Sleep disturbance 07/20/2015   Major depressive disorder, recurrent, severe without psychotic features ([place])    Major depression, recurrent, chronic ([place]) 06/22/2015   Alcohol abuse 03/20/2015   Severe recurrent major depression without psychotic features ([place]) 03/20/2015   Suicidal ideations 03/20/2015   Hyperthyroidism 11/25/2014   Alcohol use disorder, severe, dependence ([place]) 11/22/2014   Thyroid  disease 11/22/2014   Graves' disease 11/22/2014   Bipolar 1 disorder, depressed, severe ([place]) 11/21/2014   Aspiration pneumonia ([place]) 07/15/2013   Hypokalemia 07/15/2013   Elevated LFTs 07/15/2013   Microcytic anemia 07/15/2013   Thrombocytopenia ([place]) 07/15/2013   Thyromegaly 07/15/2013   Adrenal nodule, left 07/15/2013   Chest pain, midsternal 09/24/2012   Papules 09/10/2012   Suicidal ideation 09/09/2012   Chronic blood loss anemia 99991111   Acute alcoholic pancreatitis 99991111   Rectal bleeding 09/08/2012   Cocaine abuse ([place]) 09/08/2012   Anemia 09/08/2012   Left adrenal mass ([place]) 09/08/2012   Pseudogout of knee 08/27/2011   Substance abuse ([place]) 08/27/2011   Chronic pain 08/27/2011    Past Surgical History:  Procedure Laterality Date   left cataract extraction     left foot surgery     ORTHOPEDIC SURGERY     Left foot surgery       Family History  Problem Relation Age of Onset   Heart disease Mother    Arthritis Mother    Alzheimer's disease Mother    Cancer Father        prostate   Arthritis Father    Diabetes Brother        Type 1   Thyroid disease Neg Hx     Social History   Tobacco Use   Smoking status: Former    Packs/day: 0.25    Years: 10.00    Pack years:  2.50    Types: Cigarettes   Smokeless tobacco: Never  Substance Use Topics   Alcohol use: No   Drug use: Yes    Types: Marijuana, Cocaine    Comment: Former user - no clean for 2 years    Home Medications Prior to Admission medications   Medication Sig Start Date End Date Taking? Authorizing Provider  atenolol (TENORMIN) 25 MG tablet Take by mouth. 03/19/16   [provider]  cefpodoxime (VANTIN) 100 MG tablet Take 1 tablet (100 mg total) by mouth 2 (two) times daily. For 2days 02/22/18   Domenic Polite, MD  celecoxib (CELEBREX) 100 MG capsule Take 100 mg by mouth 2 (two) times daily. 10/01/21   [provider]  EPINEPHrine 0.3 mg/0.3 mL IJ SOAJ injection  Inject into the muscle. 02/23/17   [provider]  ferrous sulfate 325 (65 FE) MG tablet Take 325 mg by mouth daily with breakfast.    [provider]  furosemide (LASIX) 20 MG tablet Take 20 mg by mouth daily. 07/01/21   [provider]  HYDROcodone-acetaminophen (NORCO/VICODIN) 5-325 MG tablet Take by mouth. 07/07/17   [provider]  hydrOXYzine (ATARAX/VISTARIL) 25 MG tablet Take 1 tablet (25 mg total) by mouth every 6 (six) hours as needed for anxiety (sleep). Patient taking differently: Take 25 mg by mouth every 6 (six) hours as needed (anxiety and/or sleep).  01/25/16   Kerrie Buffalo, NP  ibuprofen (ADVIL,MOTRIN) 200 MG tablet Take 200-400 mg by mouth every 6 (six) hours as needed (for pain or headaches).     [provider]  levothyroxine (SYNTHROID, LEVOTHROID) 100 MCG tablet Take 1 tablet (100 mcg total) by mouth daily before breakfast. 02/23/18   Domenic Polite, MD  mirtazapine (REMERON) 30 MG tablet Take by mouth. 07/03/15   [provider]  tiZANidine (ZANAFLEX) 4 MG tablet Take 4 mg by mouth 3 (three) times daily. 09/27/21   [provider]  traZODone (DESYREL) 100 MG tablet Take by mouth. 03/27/15   [provider]  TRAZODONE HCL PO Take 1 tablet by mouth at bedtime as needed (for sleep).    [provider]    Allergies    Bee venom  Review of Systems   Review of Systems  Constitutional:  Negative for chills and fever.  HENT:  Negative for ear pain and sore throat.   Eyes:  Negative for pain and visual disturbance.  Respiratory:  Negative for cough and shortness of breath.   Cardiovascular:  Negative for chest pain and palpitations.  Gastrointestinal:  Positive for abdominal pain and diarrhea. Negative for nausea and vomiting.  Musculoskeletal:  Negative for arthralgias and myalgias.  Skin:  Negative for color change and rash.  Neurological:  Negative for syncope and headaches.  All other systems  reviewed and are negative.  Physical Exam Updated Vital Signs BP (!) 153/93    Pulse 81    Temp 98.8 F (37.1 C)    Resp 17    SpO2 100%   Physical Exam Constitutional:      General: He is not in acute distress. HENT:     Head: Normocephalic and atraumatic.  Eyes:     Conjunctiva/sclera: Conjunctivae normal.     Pupils: Pupils are equal, round, and reactive to light.  Cardiovascular:     Rate and Rhythm: Normal rate and regular rhythm.  Pulmonary:     Effort: Pulmonary effort is normal. No respiratory distress.  Abdominal:     General: There  is no distension.     Tenderness: There is abdominal tenderness in the left upper quadrant and left lower quadrant. There is no right CVA tenderness, left CVA tenderness, guarding or rebound. Negative signs include Murphy's sign and McBurney's sign.  Skin:    General: Skin is warm and dry.  Neurological:     General: No focal deficit present.     Mental Status: He is alert. Mental status is at baseline.  Psychiatric:        Mood and Affect: Mood normal.        Behavior: Behavior normal.    ED Results / Procedures / Treatments   Labs (all labs ordered are listed, but only abnormal results are displayed) Labs Reviewed  CBC WITH DIFFERENTIAL/PLATELET - Abnormal; Notable for the following components:      Result Value   RBC 4.21 (*)    Hemoglobin 12.4 (*)    HCT 38.5 (*)    All other components within normal limits  RAPID URINE DRUG SCREEN, HOSP PERFORMED - Abnormal; Notable for the following components:   Tetrahydrocannabinol POSITIVE (*)    All other components within normal limits  COMPREHENSIVE METABOLIC PANEL  PROTIME-INR  BRAIN NATRIURETIC PEPTIDE  URINALYSIS, ROUTINE W REFLEX MICROSCOPIC  TROPONIN I (HIGH SENSITIVITY)    EKG EKG Interpretation  Date/Time:  Wednesday November 27 2021 12:22:34 EST Ventricular Rate:  75 PR Interval:  200 QRS Duration: 96 QT Interval:  394 QTC Calculation: 439 R Axis:   -68 Text  Interpretation: Normal sinus rhythm Left axis deviation Confirmed by Alvester Chou [phone]) on 11/27/2021 6:33:01 PM  Radiology DG Chest 2 View  Result Date: 11/27/2021 CLINICAL DATA:  Shortness of breath, leg swelling EXAM: CHEST - 2 VIEW COMPARISON:  10/01/2018 FINDINGS: The heart size and mediastinal contours are within normal limits. Both lungs are clear. The visualized skeletal structures are unremarkable. IMPRESSION: No acute abnormality of the lungs. Electronically Signed   By: Jearld Lesch M.D.   On: 11/27/2021 12:56   CT ABDOMEN PELVIS W CONTRAST  Result Date: 11/27/2021 CLINICAL DATA:  Left lower quadrant abdominal pain EXAM: CT ABDOMEN AND PELVIS WITH CONTRAST TECHNIQUE: Multidetector CT imaging of the abdomen and pelvis was performed using the standard protocol following bolus administration of intravenous contrast. CONTRAST:  98mL OMNIPAQUE IOHEXOL 350 MG/ML SOLN COMPARISON:  09/08/2012 FINDINGS: Lower chest: Lung bases are clear. Hepatobiliary: Liver is within normal limits. Gallbladder is unremarkable. No intrahepatic or extrahepatic ductal dilatation. Pancreas: Within normal limits. Spleen: Scattered hepatic lesions measuring up to 10 mm (series 3/image 11), poorly evaluated but likely benign. Adrenals/Urinary Tract: 3.1 cm left adrenal mass (series 3/image 15), chronic, benign. Right adrenal gland is within normal limits. Kidneys are within normal limits.  No hydronephrosis. Bladder is underdistended but unremarkable. Stomach/Bowel: Stomach is within normal limits. No evidence of bowel obstruction. Normal appendix (series 3/image 45). Right colon is decompressed. No colonic wall thickening or inflammatory changes. Vascular/Lymphatic: No evidence of abdominal aortic aneurysm. Atherosclerotic calcifications of the abdominal aorta and branch vessels. No suspicious abdominopelvic lymphadenopathy. Reproductive: Prostate is notable for dystrophic calcifications. Other: No abdominopelvic  ascites. Musculoskeletal: Degenerative changes of the lumbar spine. IMPRESSION: No evidence of bowel obstruction.  Normal appendix. No CT findings to account for the patient's left lower quadrant abdominal pain. 3.1 cm left adrenal mass, chronic, likely reflecting a benign adrenal adenoma. Electronically Signed   By: Charline Bills M.D.   On: 11/27/2021 20:11    Procedures Procedures   Medications Ordered in  ED Medications  iohexol (OMNIPAQUE) 350 MG/ML injection 80 mL (80 mLs Intravenous Contrast Given 11/27/21 2006)    ED Course  I have reviewed the triage vital signs and the nursing notes.  Pertinent labs & imaging results that were available during my care of the patient were reviewed by me and considered in my medical decision making (see chart for details).  This patient complains of abdominal/left flank pain.  This involves an extensive number of treatment options, and is a complaint that carries with it a high risk of complications and morbidity.  The differential diagnosis includes colitis/diverticulitis vs ureteral colic vs UTI vs other  I ordered, reviewed, and interpreted labs.  No life-threatening abnormalities were noted on these tests.  Specifically, pancreas enzymes are normal, white blood cell count is normal, and UA is unremarkable.  Have a lower suspicion for GI perforation, pancreatitis, sepsis, or UTI I ordered imaging studies which included CT abdomen pelvis with IV contrast I independently visualized and interpreted imaging which showed normal sinus EKG per my interpretation shows normal sinus rhythm without acute ST elevations. Chest x-ray reviewed, unremarkable, no focal pneumothorax or PNA.  Trop is 5. Doubt ACS or PE.   Clinical Course as of 11/27/21 2332  Wed Nov 27, 2021  2017 IMPRESSION: No evidence of bowel obstruction.  Normal appendix.   No CT findings to account for the patient's left lower quadrant abdominal pain.   3.1 cm left adrenal mass,  chronic, likely reflecting a benign adrenal adenoma. [MT]  2017 Patient informed of likely incidental findings of adrenal mass, chronic - doubt adrenal crisis clinically.  No other emergent findings to explain his pain.  Doubt mesenteric ischemia or AAA with waxing and waning symptoms for weeks, and normal bloodwork here.  Okay for discharge. [MT]    Clinical Course User Index [MT] Alejandria Wessells, Carola Rhine, MD    Final Clinical Impression(s) / ED Diagnoses Final diagnoses:  Left upper quadrant abdominal pain    Rx / DC Orders ED Discharge Orders     None        Wyvonnia Dusky, MD 11/27/21 2332

## 2021-12-26 ENCOUNTER — Other Ambulatory Visit: Payer: Self-pay

## 2021-12-26 DIAGNOSIS — M7989 Other specified soft tissue disorders: Secondary | ICD-10-CM

## 2022-01-07 ENCOUNTER — Other Ambulatory Visit: Payer: Self-pay

## 2022-01-07 ENCOUNTER — Encounter: Payer: Self-pay | Admitting: Vascular Surgery

## 2022-01-07 ENCOUNTER — Ambulatory Visit (HOSPITAL_COMMUNITY)
Admission: RE | Admit: 2022-01-07 | Discharge: 2022-01-07 | Disposition: A | Discharge status: Home / Self Care | Payer: Medicare Other | Source: Ambulatory Visit | Attending: Vascular Surgery | Admitting: Vascular Surgery

## 2022-01-07 ENCOUNTER — Ambulatory Visit (INDEPENDENT_AMBULATORY_CARE_PROVIDER_SITE_OTHER): Payer: Medicare Other | Admitting: Vascular Surgery

## 2022-01-07 DIAGNOSIS — M7989 Other specified soft tissue disorders: Secondary | ICD-10-CM | POA: Insufficient documentation

## 2022-01-07 NOTE — Progress Notes (Signed)
Patient name: Phillip EgeRaymond M Mazur MRN: [id number] DOB: Apr 27, 1961 Sex: male  REASON FOR CONSULT: Evaluate lower extremity leg swelling  HPI: Phillip Kemp is a 61 y.o. male, with history of thyroid cancer that presents for evaluation of lower extremity leg swelling.  Patient states this has been ongoing for at least the last 4 to 5 months.  He feels both legs are equally affected.  He has no history of DVT.  States he was prescribed lasix, but has not picked up the prescription.  He did have a traumatic accident to his left foot with several toes amputated by a lawnmower years ago.  He then had a skin graft taken from the right leg.  He is not currently wearing compression or elevating his legs.  Past Medical History:  Diagnosis Date   Acute alcoholic pancreatitis 09/08/2012   Arthritis    Chronic blood loss anemia 09/09/2012   Depression    Gout    Hyperthyroidism    Rectal bleeding 09/08/2012   Substance abuse (HCC)    Cocaine, marijuana, and alcohol   Suicidal ideation 09/09/2012    Past Surgical History:  Procedure Laterality Date   left cataract extraction     left foot surgery     ORTHOPEDIC SURGERY     Left foot surgery    Family History  Problem Relation Age of Onset   Heart disease Mother    Arthritis Mother    Alzheimer's disease Mother    Cancer Father        prostate   Arthritis Father    Diabetes Brother        Type 1   Thyroid disease Neg Hx     SOCIAL HISTORY: Social History   Socioeconomic History   Marital status: Single    Spouse name: Not on file   Number of children: 2   Years of education: 12   Highest education level: Not on file  Occupational History   Occupation: Quarry managerHouse manager.  Tobacco Use   Smoking status: Former    Packs/day: 0.25    Years: 10.00    Pack years: 2.50    Types: Cigarettes   Smokeless tobacco: Never  Substance and Sexual Activity   Alcohol use: No   Drug use: Yes    Types: Marijuana, Cocaine    Comment: Former  user - no clean for 2 years   Sexual activity: Never  Other Topics Concern   Not on file  Social History Narrative   Widowed.  Wife died of leukemia.  2 children.  [place].   Fun: [employer]   Denies religious beliefs effecting health care.    Social Determinants of Health   Financial Resource Strain: Not on file  Food Insecurity: Not on file  Transportation Needs: Not on file  Physical Activity: Not on file  Stress: Not on file  Social Connections: Not on file  Intimate Partner Violence: Not on file    Allergies  Allergen Reactions   Bee Venom Anaphylaxis    Current Outpatient Medications  Medication Sig Dispense Refill   atenolol (TENORMIN) 25 MG tablet Take by mouth.     cefpodoxime (VANTIN) 100 MG tablet Take 1 tablet (100 mg total) by mouth 2 (two) times daily. For 2days 4 tablet 0   EPINEPHrine 0.3 mg/0.3 mL IJ SOAJ injection Inject into the muscle.     ferrous sulfate 325 (65 FE) MG tablet Take 325 mg by mouth daily with breakfast.  hydrOXYzine (ATARAX/VISTARIL) 25 MG tablet Take 1 tablet (25 mg total) by mouth every 6 (six) hours as needed for anxiety (sleep). (Patient taking differently: Take 25 mg by mouth every 6 (six) hours as needed (anxiety and/or sleep).) 30 tablet 0   ibuprofen (ADVIL,MOTRIN) 200 MG tablet Take 200-400 mg by mouth every 6 (six) hours as needed (for pain or headaches).      mirtazapine (REMERON) 30 MG tablet Take by mouth.     celecoxib (CELEBREX) 100 MG capsule Take 100 mg by mouth 2 (two) times daily. (Patient not taking: Reported on 01/07/2022)     furosemide (LASIX) 20 MG tablet Take 20 mg by mouth daily. (Patient not taking: Reported on 01/07/2022)     HYDROcodone-acetaminophen (NORCO/VICODIN) 5-325 MG tablet Take by mouth. (Patient not taking: Reported on 01/07/2022)     levothyroxine (SYNTHROID, LEVOTHROID) 100 MCG tablet Take 1 tablet (100 mcg total) by mouth daily before breakfast. (Patient not taking: Reported on  01/07/2022) 30 tablet 0   tiZANidine (ZANAFLEX) 4 MG tablet Take 4 mg by mouth 3 (three) times daily. (Patient not taking: Reported on 01/07/2022)     traZODone (DESYREL) 100 MG tablet Take by mouth. (Patient not taking: Reported on 01/07/2022)     TRAZODONE HCL PO Take 1 tablet by mouth at bedtime as needed (for sleep). (Patient not taking: Reported on 01/07/2022)     No current facility-administered medications for this visit.    REVIEW OF SYSTEMS:  [X]  denotes positive finding, [ ]  denotes negative finding Cardiac  Comments:  Chest pain or chest pressure:    Shortness of breath upon exertion:    Short of breath when lying flat:    Irregular heart rhythm:        Vascular    Pain in calf, thigh, or hip brought on by ambulation:    Pain in feet at night that wakes you up from your sleep:     Blood clot in your veins:    Leg swelling:  x Bilaterally      Pulmonary    Oxygen at home:    Productive cough:     Wheezing:         Neurologic    Sudden weakness in arms or legs:     Sudden numbness in arms or legs:     Sudden onset of difficulty speaking or slurred speech:    Temporary loss of vision in one eye:     Problems with dizziness:         Gastrointestinal    Blood in stool:     Vomited blood:         Genitourinary    Burning when urinating:     Blood in urine:        Psychiatric    Major depression:         Hematologic    Bleeding problems:    Problems with blood clotting too easily:        Skin    Rashes or ulcers:        Constitutional    Fever or chills:      PHYSICAL EXAM: Vitals:   01/07/22 1057  BP: (!) 158/91  Pulse: 74  Resp: 18  Temp: (!) 97.2 F (36.2 C)  TempSrc: Temporal  SpO2: 96%  Weight: 181 lb (82.1 kg)  Height: 6\' 3"  (1.905 m)    GENERAL: The patient is a well-nourished male, in no acute distress. The vital signs are documented above.  CARDIAC: There is a regular rate and rhythm.  VASCULAR:  Palpable femoral pulses  bilaterally Palpable PT pulses bilaterally Left 1st 2nd toes ampuated, skin graft Right calf well healed wound from previous skin graft harvest Bilateral lower extremity woody edema PULMONARY: No respiratory distress ABDOMEN: Soft and non-tender. MUSCULOSKELETAL: There are no major deformities or cyanosis. NEUROLOGIC: No focal weakness or paresthesias are detected. SKIN: There are no ulcers or rashes noted. PSYCHIATRIC: The patient has a normal affect.  DATA:   Indications: Swelling, and Edema.     Performing Technologist: Hardie Lora RVT      Examination Guidelines: A complete evaluation includes B-mode imaging,  spectral  Doppler, color Doppler, and power Doppler as needed of all accessible  portions  of each vessel. Bilateral testing is considered an integral part of a  complete  examination. Limited examinations for reoccurring indications may be  performed  as noted. The reflux portion of the exam is performed with the patient in  reverse Trendelenburg.  Significant venous reflux is defined as >500 ms in the superficial venous  system, and >1 second in the deep venous system.      Venous Reflux Times  +--------------+---------+------+-----------+------------+--------+   RIGHT          Reflux No Reflux Reflux Time Diameter cms Comments                              Yes                                       +--------------+---------+------+-----------+------------+--------+   CFV                       yes    >1 second                          +--------------+---------+------+-----------+------------+--------+   FV prox        no                                                   +--------------+---------+------+-----------+------------+--------+   FV mid         no                                                   +--------------+---------+------+-----------+------------+--------+   FV dist        no                                                    +--------------+---------+------+-----------+------------+--------+   Popliteal      no                                                   +--------------+---------+------+-----------+------------+--------+  GSV at [facility]     no                              0.726                +--------------+---------+------+-----------+------------+--------+   GSV prox thigh no                              0.597                +--------------+---------+------+-----------+------------+--------+   GSV mid thigh  no                              0.420                +--------------+---------+------+-----------+------------+--------+   GSV dist thigh no                              0.477                +--------------+---------+------+-----------+------------+--------+   GSV at knee    no                              0.574                +--------------+---------+------+-----------+------------+--------+   GSV prox calf                                  0.491                +--------------+---------+------+-----------+------------+--------+   GSV mid calf                                   0.455                +--------------+---------+------+-----------+------------+--------+   SSV Pop Fossa  no                              0.663                +--------------+---------+------+-----------+------------+--------+   SSV prox calf  no                              0.427                +--------------+---------+------+-----------+------------+--------+   SSV mid calf                                   0.438                +--------------+---------+------+-----------+------------+--------+      +--------------+---------+------+-----------+------------+--------+   LEFT           Reflux No Reflux Reflux Time Diameter cms Comments                              Yes                                       +--------------+---------+------+-----------+------------+--------+  CFV            no                                                    +--------------+---------+------+-----------+------------+--------+   FV prox        no                                                   +--------------+---------+------+-----------+------------+--------+   FV mid         no                                                   +--------------+---------+------+-----------+------------+--------+   FV dist        no                                                   +--------------+---------+------+-----------+------------+--------+   Popliteal      no                                                   +--------------+---------+------+-----------+------------+--------+   GSV at [facility]     no                              0.636                +--------------+---------+------+-----------+------------+--------+   GSV prox thigh no                              0.616                +--------------+---------+------+-----------+------------+--------+   GSV mid thigh  no                              0.536                +--------------+---------+------+-----------+------------+--------+   GSV dist thigh no                              0.451                +--------------+---------+------+-----------+------------+--------+   GSV at knee    no                              0.408                +--------------+---------+------+-----------+------------+--------+   GSV prox calf  0.361                +--------------+---------+------+-----------+------------+--------+   GSV mid calf                                   0.319                +--------------+---------+------+-----------+------------+--------+   SSV Pop Fossa  no                              0.414                +--------------+---------+------+-----------+------------+--------+   SSV prox calf  no                              0.355                +--------------+---------+------+-----------+------------+--------+   SSV mid calf                                    0.355                +--------------+---------+------+-----------+------------+--------+          Summary:  Bilateral:  - No evidence of deep vein thrombosis seen in the lower extremities,  bilaterally, from the common femoral through the popliteal veins.  - No evidence of superficial venous thrombosis in the lower extremities,  bilaterally.     Right:  - No evidence of superficial venous reflux seen in the right greater  saphenous vein.  - No evidence of superficial venous reflux seen in the right short  saphenous vein.  - Venous reflux is noted in the right common femoral vein.     Left:  - There is no evidence of venous reflux seen in the left lower extremity.  Assessment/Plan:  61 year old male presents for evaluation of bilateral lower extremity leg swelling.  As pictured above, his lower extremity venous reflux study is fairly unremarkable.  He has no reflux identified in the left leg and only deep common femoral reflux in the right leg.  Certainly is not a candidate for any surgical intervention like laser ablation given no long segment superficial reflux and unclear if there is another underlying etiology that could be contributing to his leg swelling like his previous lower extremity trauma.  We did recommend conservative measures to help with his lower extremity edema and sized him for thigh-high stockings 20 to 30 mmHg to see if this helps alleviate symptoms.  I discussed elevating his legs as well as exercise and compression for conservative management.  Happy to see him in the future if anything changes.  No open wounds or other evidence of venous ulcers at this time.   Cephus Shelling, MD [facility] Office: [phone]

## 2022-03-20 ENCOUNTER — Encounter (HOSPITAL_BASED_OUTPATIENT_CLINIC_OR_DEPARTMENT_OTHER): Payer: Self-pay | Admitting: Emergency Medicine

## 2022-03-20 ENCOUNTER — Other Ambulatory Visit: Payer: Self-pay

## 2022-03-20 ENCOUNTER — Emergency Department (HOSPITAL_BASED_OUTPATIENT_CLINIC_OR_DEPARTMENT_OTHER): Payer: Medicare Other | Admitting: Radiology

## 2022-03-20 ENCOUNTER — Inpatient Hospital Stay (HOSPITAL_BASED_OUTPATIENT_CLINIC_OR_DEPARTMENT_OTHER)
Admission: EM | Admit: 2022-03-20 | Discharge: 2022-03-24 | DRG: 603 | Disposition: A | Discharge status: Home / Self Care | Payer: Medicare Other | Attending: Internal Medicine | Admitting: Internal Medicine

## 2022-03-20 DIAGNOSIS — D649 Anemia, unspecified: Secondary | ICD-10-CM | POA: Diagnosis present

## 2022-03-20 DIAGNOSIS — Z91030 Bee allergy status: Secondary | ICD-10-CM

## 2022-03-20 DIAGNOSIS — F339 Major depressive disorder, recurrent, unspecified: Secondary | ICD-10-CM | POA: Diagnosis present

## 2022-03-20 DIAGNOSIS — A419 Sepsis, unspecified organism: Secondary | ICD-10-CM | POA: Diagnosis present

## 2022-03-20 DIAGNOSIS — Z79899 Other long term (current) drug therapy: Secondary | ICD-10-CM

## 2022-03-20 DIAGNOSIS — E039 Hypothyroidism, unspecified: Secondary | ICD-10-CM

## 2022-03-20 DIAGNOSIS — R7989 Other specified abnormal findings of blood chemistry: Secondary | ICD-10-CM | POA: Diagnosis present

## 2022-03-20 DIAGNOSIS — Z79890 Hormone replacement therapy (postmenopausal): Secondary | ICD-10-CM

## 2022-03-20 DIAGNOSIS — L03116 Cellulitis of left lower limb: Secondary | ICD-10-CM | POA: Diagnosis present

## 2022-03-20 DIAGNOSIS — F1910 Other psychoactive substance abuse, uncomplicated: Secondary | ICD-10-CM | POA: Diagnosis present

## 2022-03-20 DIAGNOSIS — H109 Unspecified conjunctivitis: Secondary | ICD-10-CM | POA: Diagnosis present

## 2022-03-20 DIAGNOSIS — K769 Liver disease, unspecified: Secondary | ICD-10-CM | POA: Diagnosis present

## 2022-03-20 DIAGNOSIS — L03115 Cellulitis of right lower limb: Secondary | ICD-10-CM | POA: Diagnosis not present

## 2022-03-20 DIAGNOSIS — E871 Hypo-osmolality and hyponatremia: Secondary | ICD-10-CM | POA: Diagnosis present

## 2022-03-20 DIAGNOSIS — E278 Other specified disorders of adrenal gland: Secondary | ICD-10-CM | POA: Diagnosis present

## 2022-03-20 DIAGNOSIS — Z87891 Personal history of nicotine dependence: Secondary | ICD-10-CM

## 2022-03-20 DIAGNOSIS — E0500 Thyrotoxicosis with diffuse goiter without thyrotoxic crisis or storm: Secondary | ICD-10-CM | POA: Diagnosis present

## 2022-03-20 DIAGNOSIS — L97929 Non-pressure chronic ulcer of unspecified part of left lower leg with unspecified severity: Secondary | ICD-10-CM | POA: Diagnosis present

## 2022-03-20 DIAGNOSIS — L97919 Non-pressure chronic ulcer of unspecified part of right lower leg with unspecified severity: Secondary | ICD-10-CM | POA: Diagnosis present

## 2022-03-20 DIAGNOSIS — Z791 Long term (current) use of non-steroidal anti-inflammatories (NSAID): Secondary | ICD-10-CM

## 2022-03-20 DIAGNOSIS — D500 Iron deficiency anemia secondary to blood loss (chronic): Secondary | ICD-10-CM | POA: Diagnosis present

## 2022-03-20 DIAGNOSIS — F1020 Alcohol dependence, uncomplicated: Secondary | ICD-10-CM | POA: Diagnosis present

## 2022-03-20 DIAGNOSIS — M109 Gout, unspecified: Secondary | ICD-10-CM | POA: Diagnosis present

## 2022-03-20 DIAGNOSIS — E872 Acidosis, unspecified: Secondary | ICD-10-CM | POA: Diagnosis present

## 2022-03-20 DIAGNOSIS — D696 Thrombocytopenia, unspecified: Secondary | ICD-10-CM | POA: Diagnosis present

## 2022-03-20 DIAGNOSIS — E279 Disorder of adrenal gland, unspecified: Secondary | ICD-10-CM | POA: Diagnosis present

## 2022-03-20 DIAGNOSIS — D6959 Other secondary thrombocytopenia: Secondary | ICD-10-CM | POA: Diagnosis present

## 2022-03-20 LAB — COMPREHENSIVE METABOLIC PANEL
ALT: 46 U/L — ABNORMAL HIGH (ref 0–44)
AST: 98 U/L — ABNORMAL HIGH (ref 15–41)
Albumin: 4.5 g/dL (ref 3.5–5.0)
Alkaline Phosphatase: 58 U/L (ref 38–126)
Anion gap: 13 (ref 5–15)
BUN: 11 mg/dL (ref 8–23)
CO2: 25 mmol/L (ref 22–32)
Calcium: 9.6 mg/dL (ref 8.9–10.3)
Chloride: 92 mmol/L — ABNORMAL LOW (ref 98–111)
Creatinine, Ser: 0.89 mg/dL (ref 0.61–1.24)
GFR, Estimated: >60 mL/min (ref >60)
Glucose, Bld: 80 mg/dL (ref 70–99)
Potassium: 4 mmol/L (ref 3.5–5.1)
Sodium: 130 mmol/L — ABNORMAL LOW (ref 135–145)
Total Bilirubin: 0.7 mg/dL (ref 0.3–1.2)
Total Protein: 7.7 g/dL (ref 6.5–8.1)

## 2022-03-20 LAB — CBC WITH DIFFERENTIAL/PLATELET
Abs Immature Granulocytes: 0.02 10*3/uL (ref 0.00–0.07)
Basophils Absolute: 0 10*3/uL (ref 0.0–0.1)
Basophils Relative: 0 %
Eosinophils Absolute: 0 10*3/uL (ref 0.0–0.5)
Eosinophils Relative: 0 %
HCT: 34.8 % — ABNORMAL LOW (ref 39.0–52.0)
Hemoglobin: 11.9 g/dL — ABNORMAL LOW (ref 13.0–17.0)
Immature Granulocytes: 0 %
Lymphocytes Relative: 31 %
Lymphs Abs: 2.5 10*3/uL (ref 0.7–4.0)
MCH: 30.1 pg (ref 26.0–34.0)
MCHC: 34.2 g/dL (ref 30.0–36.0)
MCV: 88.1 fL (ref 80.0–100.0)
Monocytes Absolute: 0.7 10*3/uL (ref 0.1–1.0)
Monocytes Relative: 9 %
Neutro Abs: 4.8 10*3/uL (ref 1.7–7.7)
Neutrophils Relative %: 60 %
Platelets: 119 10*3/uL — ABNORMAL LOW (ref 150–400)
RBC: 3.95 MIL/uL — ABNORMAL LOW (ref 4.22–5.81)
RDW: 13.9 % (ref 11.5–15.5)
WBC: 8 10*3/uL (ref 4.0–10.5)
nRBC: 0 % (ref 0.0–0.2)

## 2022-03-20 LAB — LACTIC ACID, PLASMA
Lactic Acid, Venous: 3.3 mmol/L (ref 0.5–1.9)
Lactic Acid, Venous: 3.4 mmol/L (ref 0.5–1.9)

## 2022-03-20 MED ORDER — LACTATED RINGERS IV SOLN: INTRAVENOUS | Status: DC

## 2022-03-20 MED ORDER — MORPHINE SULFATE (PF) 4 MG/ML IV SOLN
4.0000 mg | INTRAVENOUS | Status: DC | PRN
  Administered 2022-03-20 – 2022-03-21 (×2): 4 mg via INTRAVENOUS
  Filled 2022-03-20 (×2): qty 1

## 2022-03-20 MED ORDER — PIPERACILLIN-TAZOBACTAM 3.375 G IVPB 30 MIN
3.3750 g | Freq: Once | INTRAVENOUS | Status: AC
  Administered 2022-03-20: 3.375 g via INTRAVENOUS
  Filled 2022-03-20: qty 50

## 2022-03-20 MED ORDER — VANCOMYCIN HCL IN DEXTROSE 1-5 GM/200ML-% IV SOLN
1000.0000 mg | Freq: Once | INTRAVENOUS | Status: AC
  Administered 2022-03-20: 1000 mg via INTRAVENOUS
  Filled 2022-03-20: qty 200

## 2022-03-20 MED ORDER — ONDANSETRON HCL 4 MG/2ML IJ SOLN
4.0000 mg | INTRAMUSCULAR | Status: DC | PRN
  Administered 2022-03-20 – 2022-03-21 (×2): 4 mg via INTRAVENOUS
  Filled 2022-03-20 (×2): qty 2

## 2022-03-20 NOTE — ED Provider Notes (Signed)
?[name] ?Provider Note ? ? ?CSN: [id number] ?Arrival date & time: 03/20/22  1948 ? ?  ? ?History ? ?Chief Complaint  ?Patient presents with  ? Leg Swelling  ? ? ?Phillip Kemp is a 61 y.o. male. ? ?HPI ?Patient reports has been wearing compression hose on his lower legs for swelling.  He reports he has trouble getting them on and off and has been unable to remove them for about 2 weeks.  He reports the swelling actually seem to get worse and he started to get a lot of pain especially in the right foot and lower leg.  He reports has become very painful to put any weight on the feet.  Once he did remove the socks he noted there is a deep wound and some drainage coming out that looks like pus.  Has been trying to clean it at home but due to bleeding and drainage became concerned for severe infection.  He reports he has had chills but has not taken his temperature to know if he had a fever.  He has not had vomiting.  No chest pain or shortness of breath. ?  ? ?Home Medications ?Prior to Admission medications   ?Medication Sig Start Date End Date Taking? Authorizing Provider  ?atenolol (TENORMIN) 25 MG tablet Take by mouth. 03/19/16   [provider]  ?cefpodoxime (VANTIN) 100 MG tablet Take 1 tablet (100 mg total) by mouth 2 (two) times daily. For 2days 02/22/18   Domenic Polite, MD  ?celecoxib (CELEBREX) 100 MG capsule Take 100 mg by mouth 2 (two) times daily. ?Patient not taking: Reported on 01/07/2022 10/01/21   [provider]  ?EPINEPHrine 0.3 mg/0.3 mL IJ SOAJ injection Inject into the muscle. 02/23/17   [provider]  ?ferrous sulfate 325 (65 FE) MG tablet Take 325 mg by mouth daily with breakfast.    [provider]  ?furosemide (LASIX) 20 MG tablet Take 20 mg by mouth daily. ?Patient not taking: Reported on 01/07/2022 07/01/21   [provider]  ?HYDROcodone-acetaminophen (NORCO/VICODIN) 5-325 MG tablet Take by mouth. ?Patient not  taking: Reported on 01/07/2022 07/07/17   [provider]  ?hydrOXYzine (ATARAX/VISTARIL) 25 MG tablet Take 1 tablet (25 mg total) by mouth every 6 (six) hours as needed for anxiety (sleep). ?Patient taking differently: Take 25 mg by mouth every 6 (six) hours as needed (anxiety and/or sleep). 01/25/16   Kerrie Buffalo, NP  ?ibuprofen (ADVIL,MOTRIN) 200 MG tablet Take 200-400 mg by mouth every 6 (six) hours as needed (for pain or headaches).     [provider]  ?levothyroxine (SYNTHROID, LEVOTHROID) 100 MCG tablet Take 1 tablet (100 mcg total) by mouth daily before breakfast. ?Patient not taking: Reported on 01/07/2022 02/23/18   Domenic Polite, MD  ?mirtazapine (REMERON) 30 MG tablet Take by mouth. 07/03/15   [provider]  ?tiZANidine (ZANAFLEX) 4 MG tablet Take 4 mg by mouth 3 (three) times daily. ?Patient not taking: Reported on 01/07/2022 09/27/21   [provider]  ?traZODone (DESYREL) 100 MG tablet Take by mouth. ?Patient not taking: Reported on 01/07/2022 03/27/15   [provider]  ?TRAZODONE HCL PO Take 1 tablet by mouth at bedtime as needed (for sleep). ?Patient not taking: Reported on 01/07/2022    [provider]  ?   ? ?Allergies    ?Bee venom   ? ?Review of Systems   ?Review of Systems ?10 systems reviewed negative except as per HPI ?Physical Exam ?Updated  Vital Signs ?BP (!) 165/86   Pulse 72   Temp 98 ?F (36.7 ?C) (Oral)   Resp 20   Wt 84.8 kg   SpO2 94%   BMI 23.37 kg/m?  ?Physical Exam ?Constitutional:   ?   Comments: Alert nontoxic clear mental status no respiratory distress  ?HENT:  ?   Mouth/Throat:  ?   Pharynx: Oropharynx is clear.  ?Eyes:  ?   Extraocular Movements: Extraocular movements intact.  ?Cardiovascular:  ?   Rate and Rhythm: Normal rate and regular rhythm.  ?Pulmonary:  ?   Effort: Pulmonary effort is normal.  ?   Breath sounds: Normal breath sounds.  ?Abdominal:  ?   General: There is no distension.  ?   Palpations: Abdomen is  soft.  ?   Tenderness: There is no abdominal tenderness. There is no guarding.  ?Musculoskeletal:  ?   Comments: Patient has hard swelling of both lower extremities.  More swelling right than left.  Patient has an eroded linear wound across the dorsum of the foot at the ankle.  There is some purulent drainage present.  The sole of the right foot is erythematous and blanching very tender.  Both feet are hot to the touch  ? ? ? ? ? ? ? ? ?ED Results / Procedures / Treatments   ?Labs ?(all labs ordered are listed, but only abnormal results are displayed) ?Labs Reviewed  ?LACTIC ACID, PLASMA - Abnormal; Notable for the following components:  ?    Result Value  ? Lactic Acid, Venous 3.3 (*)   ? All other components within normal limits  ?LACTIC ACID, PLASMA - Abnormal; Notable for the following components:  ? Lactic Acid, Venous 3.4 (*)   ? All other components within normal limits  ?COMPREHENSIVE METABOLIC PANEL - Abnormal; Notable for the following components:  ? Sodium 130 (*)   ? Chloride 92 (*)   ? AST 98 (*)   ? ALT 46 (*)   ? All other components within normal limits  ?CBC WITH DIFFERENTIAL/PLATELET - Abnormal; Notable for the following components:  ? RBC 3.95 (*)   ? Hemoglobin 11.9 (*)   ? HCT 34.8 (*)   ? Platelets 119 (*)   ? All other components within normal limits  ?CULTURE, BLOOD (ROUTINE X 2)  ?CULTURE, BLOOD (ROUTINE X 2)  ? ? ?EKG ?None ? ?Radiology ?DG Foot 2 Views Left ? 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 Surgical defect in the distal first metatarsal is again seen. Distal soft tissue swelling is noted. No bony erosive changes are noted. IMPRESSION: Soft tissue swelling and prior postsurgical changes. Electronically Signed   By: Inez Catalina M.D.   On: 03/20/2022 22:55  ? ?DG Foot 2 Views Right ? ?Result Date: 03/20/2022 ?CLINICAL  DATA:  Bilateral foot pain and swelling, initial encounter EXAM: RIGHT FOOT - 2 VIEW COMPARISON:  10/08/2021 FINDINGS: Soft tissue swelling is noted in the foot particularly along the dorsum of the foot. No acute fracture or dislocation is noted. No bony erosive changes are seen. IMPRESSION: Soft tissue swelling without acute bony abnormality. Electronically Signed   By: Inez Catalina M.D.   On: 03/20/2022 22:54   ? ?Procedures ?Procedures  ? ? ?Medications Ordered in ED ?Medications  ?vancomycin (VANCOCIN) IVPB 1000 mg/200 mL premix (1,000 mg Intravenous New  Bag/Given 03/20/22 2332)  ?lactated ringers infusion ( Intravenous New Bag/Given 03/20/22 2319)  ?morphine (PF) 4 MG/ML injection 4 mg (4 mg Intravenous Given 03/20/22 2303)  ?ondansetron [facility]) injection 4 mg (4 mg Intravenous Given 03/20/22 2303)  ?piperacillin-tazobactam (ZOSYN) IVPB 3.375 g (0 g Intravenous Stopped 03/20/22 2331)  ? ? ?ED Course/ Medical Decision Making/ A&P ?  ?                        ?Medical Decision Making ?Amount and/or Complexity of Data Reviewed ?Labs: ordered. ?Radiology: ordered. ? ?Risk ?Prescription drug management. ?Decision regarding hospitalization. ? ? ?Patient presents with chronic lower extremity edema having worn compression hose for 2 weeks without removing them.  Patient has developed eroded wound from pressure.  The area is tender blanching erythema of the forefoot and sole on the right.  No area of fluctuance or crepitus.  We will proceed with diagnostic lab work, pain control and IV antibiotics ? ?Medical record reviewed patient had consultation with vascular surgery January of this year.  No operable lesions identified.  Recommendation was for compression and elevation. ? ?X-rays of the feet visually reviewed by myself.  No appearance of gas or apparent bony abnormality.  Radiology review also reviewed. ? ?With open wounds and some purulent drainage in conjunction with foot infection with baseline poor venous return, will  treat with Zosyn and vancomycin.  Patient is nontoxic.  He does not have hypotension.  No sepsis signs at this time. ? ?Consult: [facility] hospitalist Dr.Shaloub for admission. ? ? ? ? ? ? ? ?Final Clinical Impression(s) /

## 2022-03-20 NOTE — ED Notes (Signed)
CRITICAL VALUE STICKER ? ?CRITICAL VALUE: lactic 3.3 ? ?RECEIVER (on-site recipient of call):Delpha Perko Keshona Kartes, RN ? ?DATE & TIME NOTIFIED: 03/20/22 2128 ? ?MESSENGER (representative from lab): Mikle Bosworth ? ?MD NOTIFIED: Dr. Donnald Garre ? ?TIME OF NOTIFICATION: 2131 ? ? ?

## 2022-03-20 NOTE — ED Triage Notes (Signed)
Pt presents from home for wounds to bilateral legs related to wearing compression stockings for 2 weeks continuously. Tried peroxide at home but became concerned about bleeding and pus, particularly from wound to R ankle.  ?H/o lung CA and thyroid. ?

## 2022-03-21 DIAGNOSIS — E279 Disorder of adrenal gland, unspecified: Secondary | ICD-10-CM | POA: Diagnosis present

## 2022-03-21 DIAGNOSIS — E0500 Thyrotoxicosis with diffuse goiter without thyrotoxic crisis or storm: Secondary | ICD-10-CM | POA: Diagnosis present

## 2022-03-21 DIAGNOSIS — L97919 Non-pressure chronic ulcer of unspecified part of right lower leg with unspecified severity: Secondary | ICD-10-CM | POA: Diagnosis present

## 2022-03-21 DIAGNOSIS — M109 Gout, unspecified: Secondary | ICD-10-CM | POA: Diagnosis present

## 2022-03-21 DIAGNOSIS — F1020 Alcohol dependence, uncomplicated: Secondary | ICD-10-CM | POA: Diagnosis present

## 2022-03-21 DIAGNOSIS — Z79890 Hormone replacement therapy (postmenopausal): Secondary | ICD-10-CM | POA: Diagnosis not present

## 2022-03-21 DIAGNOSIS — D500 Iron deficiency anemia secondary to blood loss (chronic): Secondary | ICD-10-CM | POA: Diagnosis present

## 2022-03-21 DIAGNOSIS — R7989 Other specified abnormal findings of blood chemistry: Secondary | ICD-10-CM | POA: Diagnosis present

## 2022-03-21 DIAGNOSIS — L97929 Non-pressure chronic ulcer of unspecified part of left lower leg with unspecified severity: Secondary | ICD-10-CM | POA: Diagnosis present

## 2022-03-21 DIAGNOSIS — D6959 Other secondary thrombocytopenia: Secondary | ICD-10-CM | POA: Diagnosis present

## 2022-03-21 DIAGNOSIS — E872 Acidosis, unspecified: Secondary | ICD-10-CM | POA: Diagnosis present

## 2022-03-21 DIAGNOSIS — A419 Sepsis, unspecified organism: Secondary | ICD-10-CM | POA: Diagnosis present

## 2022-03-21 DIAGNOSIS — L03115 Cellulitis of right lower limb: Secondary | ICD-10-CM | POA: Diagnosis present

## 2022-03-21 DIAGNOSIS — Z79899 Other long term (current) drug therapy: Secondary | ICD-10-CM | POA: Diagnosis not present

## 2022-03-21 DIAGNOSIS — H109 Unspecified conjunctivitis: Secondary | ICD-10-CM | POA: Diagnosis present

## 2022-03-21 DIAGNOSIS — Z87891 Personal history of nicotine dependence: Secondary | ICD-10-CM | POA: Diagnosis not present

## 2022-03-21 DIAGNOSIS — F339 Major depressive disorder, recurrent, unspecified: Secondary | ICD-10-CM | POA: Diagnosis present

## 2022-03-21 DIAGNOSIS — E871 Hypo-osmolality and hyponatremia: Secondary | ICD-10-CM | POA: Diagnosis present

## 2022-03-21 DIAGNOSIS — E039 Hypothyroidism, unspecified: Secondary | ICD-10-CM | POA: Diagnosis present

## 2022-03-21 DIAGNOSIS — Z791 Long term (current) use of non-steroidal anti-inflammatories (NSAID): Secondary | ICD-10-CM | POA: Diagnosis not present

## 2022-03-21 DIAGNOSIS — K769 Liver disease, unspecified: Secondary | ICD-10-CM | POA: Diagnosis present

## 2022-03-21 DIAGNOSIS — L03116 Cellulitis of left lower limb: Secondary | ICD-10-CM | POA: Diagnosis present

## 2022-03-21 DIAGNOSIS — Z91030 Bee allergy status: Secondary | ICD-10-CM | POA: Diagnosis not present

## 2022-03-21 LAB — COMPREHENSIVE METABOLIC PANEL
ALT: 48 U/L — ABNORMAL HIGH (ref 0–44)
AST: 94 U/L — ABNORMAL HIGH (ref 15–41)
Albumin: 4 g/dL (ref 3.5–5.0)
Alkaline Phosphatase: 56 U/L (ref 38–126)
Anion gap: 13 (ref 5–15)
BUN: 9 mg/dL (ref 8–23)
CO2: 25 mmol/L (ref 22–32)
Calcium: 9 mg/dL (ref 8.9–10.3)
Chloride: 99 mmol/L (ref 98–111)
Creatinine, Ser: 1 mg/dL (ref 0.61–1.24)
GFR, Estimated: >60 mL/min (ref >60)
Glucose, Bld: 80 mg/dL (ref 70–99)
Potassium: 3.9 mmol/L (ref 3.5–5.1)
Sodium: 137 mmol/L (ref 135–145)
Total Bilirubin: 0.8 mg/dL (ref 0.3–1.2)
Total Protein: 7.3 g/dL (ref 6.5–8.1)

## 2022-03-21 LAB — CBC WITH DIFFERENTIAL/PLATELET
Abs Immature Granulocytes: 0.01 10*3/uL (ref 0.00–0.07)
Basophils Absolute: 0 10*3/uL (ref 0.0–0.1)
Basophils Relative: 1 %
Eosinophils Absolute: 0 10*3/uL (ref 0.0–0.5)
Eosinophils Relative: 0 %
HCT: 36 % — ABNORMAL LOW (ref 39.0–52.0)
Hemoglobin: 12.4 g/dL — ABNORMAL LOW (ref 13.0–17.0)
Immature Granulocytes: 0 %
Lymphocytes Relative: 30 %
Lymphs Abs: 1.3 10*3/uL (ref 0.7–4.0)
MCH: 30.8 pg (ref 26.0–34.0)
MCHC: 34.4 g/dL (ref 30.0–36.0)
MCV: 89.6 fL (ref 80.0–100.0)
Monocytes Absolute: 0.5 10*3/uL (ref 0.1–1.0)
Monocytes Relative: 13 %
Neutro Abs: 2.4 10*3/uL (ref 1.7–7.7)
Neutrophils Relative %: 56 %
Platelets: 103 10*3/uL — ABNORMAL LOW (ref 150–400)
RBC: 4.02 MIL/uL — ABNORMAL LOW (ref 4.22–5.81)
RDW: 13.9 % (ref 11.5–15.5)
WBC: 4.2 10*3/uL (ref 4.0–10.5)
nRBC: 0 % (ref 0.0–0.2)

## 2022-03-21 LAB — PHOSPHORUS: Phosphorus: 3.6 mg/dL (ref 2.5–4.6)

## 2022-03-21 LAB — LACTIC ACID, PLASMA
Lactic Acid, Venous: 3.5 mmol/L (ref 0.5–1.9)
Lactic Acid, Venous: 1.6 mmol/L (ref 0.5–1.9)

## 2022-03-21 LAB — TSH: TSH: 21.404 u[IU]/mL — ABNORMAL HIGH (ref 0.350–4.500)

## 2022-03-21 LAB — MAGNESIUM: Magnesium: 1.4 mg/dL — ABNORMAL LOW (ref 1.7–2.4)

## 2022-03-21 MED ORDER — FOLIC ACID 1 MG PO TABS
1.0000 mg | ORAL_TABLET | Freq: Every day | ORAL | Status: DC
  Administered 2022-03-21 – 2022-03-24 (×4): 1 mg via ORAL
  Filled 2022-03-21 (×4): qty 1

## 2022-03-21 MED ORDER — ADULT MULTIVITAMIN W/MINERALS CH
1.0000 | ORAL_TABLET | Freq: Every day | ORAL | Status: DC
  Administered 2022-03-21 – 2022-03-24 (×4): 1 via ORAL
  Filled 2022-03-21 (×4): qty 1

## 2022-03-21 MED ORDER — VANCOMYCIN HCL 1250 MG/250ML IV SOLN
1250.0000 mg | Freq: Two times a day (BID) | INTRAVENOUS | Status: DC
  Administered 2022-03-21 – 2022-03-23 (×5): 1250 mg via INTRAVENOUS
  Filled 2022-03-21 (×6): qty 250

## 2022-03-21 MED ORDER — LORAZEPAM 1 MG PO TABS
0.0000 mg | ORAL_TABLET | ORAL | Status: DC
  Administered 2022-03-21 – 2022-03-22 (×3): 1 mg via ORAL
  Filled 2022-03-21 (×3): qty 1

## 2022-03-21 MED ORDER — THIAMINE HCL 100 MG/ML IJ SOLN: 100.0000 mg | Freq: Every day | INTRAMUSCULAR | Status: DC

## 2022-03-21 MED ORDER — LORAZEPAM 1 MG PO TABS: 0.0000 mg | ORAL_TABLET | Freq: Three times a day (TID) | ORAL | Status: DC

## 2022-03-21 MED ORDER — ACETAMINOPHEN 650 MG RE SUPP: 650.0000 mg | Freq: Four times a day (QID) | RECTAL | Status: DC | PRN

## 2022-03-21 MED ORDER — LEVOTHYROXINE SODIUM 100 MCG PO TABS
100.0000 ug | ORAL_TABLET | Freq: Every day | ORAL | Status: DC
  Administered 2022-03-21 – 2022-03-24 (×4): 100 ug via ORAL
  Filled 2022-03-21 (×4): qty 1

## 2022-03-21 MED ORDER — TIZANIDINE HCL 4 MG PO TABS: 4.0000 mg | ORAL_TABLET | Freq: Three times a day (TID) | ORAL | Status: DC | PRN

## 2022-03-21 MED ORDER — SODIUM CHLORIDE 0.9 % IV SOLN
2.0000 g | Freq: Three times a day (TID) | INTRAVENOUS | Status: DC
  Administered 2022-03-21 – 2022-03-23 (×7): 2 g via INTRAVENOUS
  Filled 2022-03-21 (×10): qty 12.5

## 2022-03-21 MED ORDER — OXYCODONE HCL 5 MG PO TABS
5.0000 mg | ORAL_TABLET | ORAL | Status: DC | PRN
  Administered 2022-03-21 – 2022-03-23 (×3): 5 mg via ORAL
  Filled 2022-03-21 (×3): qty 1

## 2022-03-21 MED ORDER — LORAZEPAM 1 MG PO TABS: 1.0000 mg | ORAL_TABLET | ORAL | Status: AC | PRN

## 2022-03-21 MED ORDER — THIAMINE HCL 100 MG PO TABS
100.0000 mg | ORAL_TABLET | Freq: Every day | ORAL | Status: DC
  Administered 2022-03-21 – 2022-03-24 (×4): 100 mg via ORAL
  Filled 2022-03-21 (×4): qty 1

## 2022-03-21 MED ORDER — MIRTAZAPINE 15 MG PO TABS
30.0000 mg | ORAL_TABLET | Freq: Every day | ORAL | Status: DC
  Administered 2022-03-21 – 2022-03-23 (×3): 30 mg via ORAL
  Filled 2022-03-21 (×3): qty 2

## 2022-03-21 MED ORDER — LORAZEPAM 2 MG/ML IJ SOLN
1.0000 mg | INTRAMUSCULAR | Status: AC | PRN
  Administered 2022-03-21: 2 mg via INTRAVENOUS
  Filled 2022-03-21: qty 1

## 2022-03-21 MED ORDER — CELECOXIB 100 MG PO CAPS
100.0000 mg | ORAL_CAPSULE | Freq: Two times a day (BID) | ORAL | Status: DC
  Administered 2022-03-21 – 2022-03-24 (×7): 100 mg via ORAL
  Filled 2022-03-21 (×8): qty 1

## 2022-03-21 MED ORDER — FERROUS SULFATE 325 (65 FE) MG PO TABS
325.0000 mg | ORAL_TABLET | Freq: Every day | ORAL | Status: DC
  Administered 2022-03-21 – 2022-03-24 (×4): 325 mg via ORAL
  Filled 2022-03-21 (×4): qty 1

## 2022-03-21 MED ORDER — LACTATED RINGERS IV BOLUS
1000.0000 mL | Freq: Once | INTRAVENOUS | Status: AC
  Administered 2022-03-21: 1000 mL via INTRAVENOUS

## 2022-03-21 MED ORDER — METRONIDAZOLE 500 MG/100ML IV SOLN
500.0000 mg | Freq: Two times a day (BID) | INTRAVENOUS | Status: DC
  Administered 2022-03-21 – 2022-03-23 (×5): 500 mg via INTRAVENOUS
  Filled 2022-03-21 (×5): qty 100

## 2022-03-21 MED ORDER — ATENOLOL 25 MG PO TABS
25.0000 mg | ORAL_TABLET | Freq: Every day | ORAL | Status: DC
Start: 2022-03-21 — End: 2022-03-24
  Administered 2022-03-21 – 2022-03-24 (×4): 25 mg via ORAL
  Filled 2022-03-21 (×4): qty 1

## 2022-03-21 MED ORDER — ACETAMINOPHEN 325 MG PO TABS
650.0000 mg | ORAL_TABLET | Freq: Four times a day (QID) | ORAL | Status: DC | PRN
  Administered 2022-03-22: 650 mg via ORAL
  Filled 2022-03-21: qty 2

## 2022-03-21 MED ORDER — LACTATED RINGERS IV BOLUS
1000.0000 mL | Freq: Once | INTRAVENOUS | Status: AC
Start: 2022-03-21 — End: 2022-03-21
  Administered 2022-03-21: 1000 mL via INTRAVENOUS

## 2022-03-21 MED ORDER — MAGNESIUM SULFATE 4 GM/100ML IV SOLN
4.0000 g | Freq: Once | INTRAVENOUS | Status: AC
  Administered 2022-03-21: 4 g via INTRAVENOUS
  Filled 2022-03-21: qty 100

## 2022-03-21 MED ORDER — ENOXAPARIN SODIUM 40 MG/0.4ML IJ SOSY
40.0000 mg | PREFILLED_SYRINGE | INTRAMUSCULAR | Status: DC
  Administered 2022-03-21 – 2022-03-24 (×4): 40 mg via SUBCUTANEOUS
  Filled 2022-03-21 (×4): qty 0.4

## 2022-03-21 NOTE — Consult Note (Signed)
WOC Nurse wound consult note ?Consultation was completed by review of records, images and assistance from the bedside nurse/clinical staff.  ? ?Reason for Consult: bilateral LE wounds ?Patient was seen by vascular in January 2023 at which time they recommended compression stockings. Unclear if the patient was fitted for stockings or purchased them OTC.  Due to difficulty with donning and doffing the stockings he has had them on for 2 weeks. He now presents with medical device related pressure injuries from the stockings on the dorsal aspect of the ankle region ?Wound type: MDRPI bilateral LEs, related to compression stockings ?Pressure Injury POA: Yes ?Measurement: see nursing flow sheet ?Wound bed: right LE wound is full thickness, pale, pink, some yellow in the base. Superficial wound on the same area of the left LE ?Drainage (amount, consistency, odor) per flow sheet none ?Periwound: edema, unclear if the stockings he is using at home are toeless but based on the indention in the right foot I would say so and the fact that he has edema noted past the this indention.  ?Dressing procedure/placement/frequency:  ?1. Cleanse right and left LE wounds with saline, pat dry  ?2. Apply strip of silver hydrofiber (Aquacel Ag+) to the right LE wound, top with foam  ?3. Apply silicone foam to the left LE wound  ?4. Wrap bilateral LEs with kerlix from toes to knees, followed by 4" Coban from toes to knees.  ? ?Change every other day while inpatient. ?Educate patient on importance of compression starting at the toes to the knees to be most therapeutic ? ?Consider HHRN for compression wraps 2x wk at DC, possibly Unna's boots or Profore based on the patient's ambulatory status.  ? ? ?Re consult if needed, will not follow at this time. ?Thanks ? Phillip Kemp [facility] MSN, RN,CWOCN, CNS, CWON-AP [phone])   ? ? ?

## 2022-03-21 NOTE — Progress Notes (Signed)
?  Carryover admission to the Day Admitter.   ? ? ? ?This is a transfer from [facility] emergency department who is accepted for admission to med telemetry unit at [facility] by Dr. Cyd Silence for further evaluation management of right lower extremity cellulitis.  Please see Dr. Katherina Mires progress note for additional details.  ? ?In general, this is a 61 year old male right lower extremity cellulitis.  Started on IV vancomycin and Zosyn at [facility] for right lower extremity cellulitis.  Lactic acid elevated at 3.4.  on Continuous IV fluids. ? ?I have placed some additional preliminary admit orders via the adult multi-morbid admission order set.  Continue to existing as needed morphine as well as existing continuous IV fluids.  Have ordered repeat lactate to be checked now.  Additionally, ordered a fresh set of morning labs to be checked now.  I will defer additional antibiotic selection to admitting physician.  ? ? ? ?Babs Bertin, DO ?Hospitalist ? ?

## 2022-03-21 NOTE — Progress Notes (Signed)
Plan of Care Note for accepted transfer ? ? ?Patient: Phillip Kemp MRN: [id number]   DOA: 03/20/2022 ? ?Facility requesting transfer[facility] ?Requesting Provider: Dr. Donnald Garre ?Reason for transfer: Bilateral lower extremity cellulitis ?Facility course:  ? ?61 year old male with past medical history of Graves' disease, iatrogenic hypothyroidism, alcohol abuse who [facility] with complaints of bilateral lower extremity wounds and pain. ? ?Patient was seen by Dr. Chestine Spore in vascular surgical clinic and January for bilateral lower extremity edema.  At that time conservative measures were recommended with compression stockings.  Due to patient's extreme difficulty with putting the stockings on taking them back off patient had left the stockings on for the last 2 weeks resulting in development of ulcerations of both legs.  Patient is since developed drainage from these ulcerations with surrounding redness warmth and pain. ? ?Upon evaluation in the emergency department ER provider feels the patient now has bilateral lower extremity cellulitis secondary to the development of these wounds.  X-ray revealed no evidence of gas or osteomyelitis.  Patient was placed on intravenous vancomycin and Zosyn.  ER provider now requesting hospitalization for bilateral lower extremity cellulitis. ? ? ? ? ? ? ? ? ?Plan of care: ?The patient is accepted for admission to Telemetry unit, at [facility]..  ? ? ?Author: ?Marinda Elk, MD ?03/21/2022 ? ?Check [email or web address] for on-call coverage. ? ?Nursing staff, Please call [facility] Admits & Consults System-Wide number on Amion as soon as patient's arrival, so appropriate admitting provider can evaluate the pt. ?

## 2022-03-21 NOTE — H&P (Addendum)
?History and Physical  ? ? ?Patient: Phillip Kemp QBH:[id number] DOB: 02/07/1961 ?DOA: 03/20/2022 ?DOS: the patient was seen and examined on 03/21/2022 ?PCP: Ellyn Hack, MD  ?Patient coming from: Home ? ?Chief Complaint:  ?Chief Complaint  ?Patient presents with  ? Leg Swelling  ? ?HPI: Phillip Kemp is a 61 y.o. male with medical history significant of alcoholic pancreatitis, alcohol dependence, osteoarthritis, chronic blood loss anemia, depression, history of SI, gout, hypothyroidism, hypothyroidism, history of rectal bleeding, polysubstance abuse who was initially seen at [facility] ED due to presenting there with bilateral lower extremity edema and wounds after wearing his compression stockings for 2 weeks stating that he was unable to remove them until yesterday when he noticed some drainage coming from his feet.  He was finally able to remove linens noticed bleeding and purulent discharge.  He has had some chills and night sweats, but not sure if he has had a fever.  He stated in the ED that he has not had any vomiting, but told me that he was vomiting once or twice a day until 2 days ago.  He has also had generalized weakness with significant decrease in mobility.  Denied sore throat, rhinorrhea, dyspnea, chest pain, diaphoresis, PND orthopnea.  He is asked frequent epigastric and RUQ pain.  He occasionally gets diarrhea.  Deny constipation, melena or hematochezia.  No flank pain, dysuria, frequency or hematuria. ? ?ED course: Initial vital signs were temperature 98 ?F, pulse 74, respiration 19, BP 158/91 mmHg O2 sat 98% on room air.  He received the Zosyn, vancomycin and morphine for pain. ? ?Lab work: CBC is her white count 8.0, hemoglobin 11.9 g/dL platelets 097.  Lactic acid was 3.3, 3.4 and then 3.5 mmol/L.  CMP showed a sodium of 130 and chloride 92 mmol/L along with mild elevation of transaminases, but the rest of the CMP measurements were within expected range.  Repeat CMP showed correction of  hyponatremia and persistence of  transaminitis.  Magnesium level was 1.4 mg/dL. ? ?Imaging: Right foot x-ray showed soft tissue swelling without acute osseous abnormality.  Left foot shows soft tissue swelling with prior postsurgical hardware.  No bony erosive changes seen. ?  ?Review of Systems: As mentioned in the history of present illness. All other systems reviewed and are negative. ?Past Medical History:  ?Diagnosis Date  ? Acute alcoholic pancreatitis 09/08/2012  ? Arthritis   ? Chronic blood loss anemia 09/09/2012  ? Depression   ? Gout   ? Hyperthyroidism   ? Rectal bleeding 09/08/2012  ? Substance abuse (HCC)   ? Cocaine, marijuana, and alcohol  ? Suicidal ideation 09/09/2012  ? ?Past Surgical History:  ?Procedure Laterality Date  ? left cataract extraction    ? left foot surgery    ? ORTHOPEDIC SURGERY    ? Left foot surgery  ? ?Social History:  reports that he has quit smoking. His smoking use included cigarettes. He has a 2.50 pack-year smoking history. He has never used smokeless tobacco. He reports current drug use. Drugs: Marijuana and Cocaine. He reports that he does not drink alcohol. ? ?Allergies  ?Allergen Reactions  ? Bee Venom Anaphylaxis  ? ? ?Family History  ?Problem Relation Age of Onset  ? Heart disease Mother   ? Arthritis Mother   ? Alzheimer's disease Mother   ? Cancer Father   ?     prostate  ? Arthritis Father   ? Diabetes Brother   ?  Type 1  ? Thyroid disease Neg Hx   ? ? ?Prior to Admission medications   ?Medication Sig Start Date End Date Taking? Authorizing Provider  ?atenolol (TENORMIN) 25 MG tablet Take 25 mg by mouth daily. 03/19/16  Yes [provider]  ?cefpodoxime (VANTIN) 100 MG tablet Take 1 tablet (100 mg total) by mouth 2 (two) times daily. For 2days 02/22/18  Yes Zannie Cove, MD  ?celecoxib (CELEBREX) 100 MG capsule Take 100 mg by mouth 2 (two) times daily. 10/01/21  Yes [provider]  ?ferrous sulfate 325 (65 FE) MG tablet Take 325 mg by mouth daily  with breakfast.   Yes [provider]  ?furosemide (LASIX) 20 MG tablet Take 20 mg by mouth daily. 07/01/21  Yes [provider]  ?ibuprofen (ADVIL,MOTRIN) 200 MG tablet Take 200-400 mg by mouth every 6 (six) hours as needed for mild pain, moderate pain or fever (for pain or headaches).   Yes [provider]  ?levothyroxine (SYNTHROID, LEVOTHROID) 100 MCG tablet Take 1 tablet (100 mcg total) by mouth daily before breakfast. 02/23/18  Yes Zannie Cove, MD  ?mirtazapine (REMERON) 30 MG tablet Take 30 mg by mouth at bedtime. 07/03/15  Yes [provider]  ?tiZANidine (ZANAFLEX) 4 MG tablet Take 4 mg by mouth 3 (three) times daily. 09/27/21  Yes [provider]  ?EPINEPHrine 0.3 mg/0.3 mL IJ SOAJ injection Inject into the muscle. 02/23/17   [provider]  ? ? ?Physical Exam: ?Vitals:  ? 03/21/22 0230 03/21/22 0300 03/21/22 0400 03/21/22 0454  ?BP: 135/80 120/65 138/70 (!) 168/100  ?Pulse: 63 67 64 78  ?Resp: 16 18 18 18   ?Temp:    99.3 ?F (37.4 ?C)  ?TempSrc:    Oral  ?SpO2: 91% 91% 95% 95%  ?Weight:      ? ?Physical Exam ?Vitals and nursing note reviewed.  ?HENT:  ?   Head: Normocephalic.  ?   Mouth/Throat:  ?   Mouth: Mucous membranes are moist.  ?Eyes:  ?   General: No scleral icterus. ?   Conjunctiva/sclera:  ?   Right eye: Right conjunctiva is injected.  ?   Left eye: Left conjunctiva is injected.  ?   Pupils: Pupils are equal, round, and reactive to light.  ?Neck:  ?   Vascular: No JVD.  ?Cardiovascular:  ?   Rate and Rhythm: Normal rate and regular rhythm.  ?   Heart sounds: S1 normal and S2 normal.  ?Pulmonary:  ?   Effort: Pulmonary effort is normal.  ?   Breath sounds: Normal breath sounds. No wheezing, rhonchi or rales.  ?Abdominal:  ?   General: Bowel sounds are normal. There is distension.  ?   Palpations: Abdomen is soft.  ?   Tenderness: There is no abdominal tenderness. There is no guarding.  ?Musculoskeletal:  ?   Cervical back: Neck supple.  ?    Right lower leg: No edema.  ?   Left lower leg: No edema.  ?Skin: ?   General: Skin is warm and dry.  ?   Findings: Erythema, lesion and wound present.  ?Neurological:  ?   General: No focal deficit present.  ?   Mental Status: He is oriented to person, place, and time.  ?Psychiatric:     ?   Mood and Affect: Mood normal.  ? ? ? ? ? ? ? ?Data Reviewed: ? ?There are no new results to review at this time. ? ?Assessment and Plan: ?Principal Problem: ?  Sepsis POA (HCC) ?Secondary to ?  Cellulitis of both lower extremities ?Admit to telemetry/inpatient. ?Continue IV fluids. ?Consult wound care.   ?Analgesics as needed ?Continue vancomycin per pharmacy. ?Substitute Zosyn for cefepime/metronidazole. ?Follow-up blood culture and sensitivity. ? ?Active Problems: ?  Alcohol use disorder, severe, dependence (HCC) ?Begin CIWA protocol with lorazepam. ?Folate, MVI and thiamine supplementation. ?Magnesium sulfate has been given. ? ?  Substance abuse (HCC) ?Consult TOC team. ?Substance abuse cessation recommended ? ?  Normocytic anemia ?  Chronic blood loss anemia ?Monitor hematocrit and hemoglobin. ?Alcohol cessation. ? ?  Hyponatremia ?Likely dilutional from beer consumption. ?Has resolved with IV hydration. ? ?  Elevated LFTs ?Secondary to EtOH use. ?Alcohol cessation advised. ? ?  Thrombocytopenia (HCC) ?Due to alcohol use. ?Monitor platelet count. ? ?  Graves' disease ?Posttreatment ?  Hypothyroidism. ?Check TSH level. ?Continue levothyroxine 100 mcg p.o. daily. ? ?  Hypomagnesemia ?Replacing. ? ?  Left adrenal mass ?Benign in appearance. ?Follow-up with primary care provider. ? ? ? Advance Care Planning:   Code Status: Full Code  ? ?Consults:  ? ?Family Communication:  ? ?Severity of Illness: ?The appropriate patient status for this patient is INPATIENT. Inpatient status is judged to be reasonable and necessary in order to provide the required intensity of service to ensure the patient's safety. The patient's presenting  symptoms, physical exam findings, and initial radiographic and laboratory data in the context of their chronic comorbidities is felt to place them at high risk for further clinical deterioration. Furthermore,

## 2022-03-21 NOTE — Progress Notes (Signed)
Pharmacy Antibiotic Note ? ?VIDIT BOISSONNEAULT is a 61 y.o. male admitted on 03/20/2022 with cellulitis.  Pharmacy has been consulted for Vancomycin dosing. ? ?Plan: ?Cefepime and metronidazole per MD. ?Vancomycin 1250mg  IV q12h. (SCr 1, expected AUC 503) ?Measure Vanc levels as needed.  Goal AUC = 400 - 550. ?Follow up renal function, culture results, and clinical course. ? ? ?Weight: 84.8 kg (187 lb) ? ?Temp (24hrs), Avg:98.7 ?F (37.1 ?C), Min:98 ?F (36.7 ?C), Max:99.3 ?F (37.4 ?C) ? ?Recent Labs  ?Lab 03/20/22 ?1954 03/20/22 ?2214 03/21/22 ?0603  ?WBC 8.0  --  4.2  ?CREATININE 0.89  --  1.00  ?LATICACIDVEN 3.3* 3.4* 3.5*  ?  ?Estimated Creatinine Clearance: 92.7 mL/min (by C-G formula based on SCr of 1 mg/dL).   ? ?Allergies  ?Allergen Reactions  ? Bee Venom Anaphylaxis  ? ? ?Antimicrobials this admission:  ?4/6 Vanc >> ?4/6 Zosyn x1 ?4/7 Cefepime >>  ?4/7 metronidazole >>  ? ?Dose adjustments this admission:  ? ?Microbiology results:  ?4/6 BCx:  ? ?Thank you for allowing pharmacy to be a part of this patient?s care. ? ?6/6 PharmD, BCPS ?Clinical Pharmacist ?[facility] [phone] ?03/21/2022 8:01 AM ? ? ?

## 2022-03-21 NOTE — ED Notes (Signed)
[facility] called to transport patient to [facility] rm# [id number] ?

## 2022-03-21 NOTE — Progress Notes (Signed)
Date and time results received: 03/21/22 0730 ? ?Test: Lactic Acid  ?Critical Value: 3.5 ? ?Name of Provider Notified: Robb Matar MD ? ? ?

## 2022-03-22 ENCOUNTER — Encounter (HOSPITAL_COMMUNITY): Payer: Self-pay

## 2022-03-22 DIAGNOSIS — F1020 Alcohol dependence, uncomplicated: Secondary | ICD-10-CM

## 2022-03-22 DIAGNOSIS — D500 Iron deficiency anemia secondary to blood loss (chronic): Secondary | ICD-10-CM | POA: Diagnosis not present

## 2022-03-22 DIAGNOSIS — L03115 Cellulitis of right lower limb: Secondary | ICD-10-CM | POA: Diagnosis not present

## 2022-03-22 DIAGNOSIS — F339 Major depressive disorder, recurrent, unspecified: Secondary | ICD-10-CM

## 2022-03-22 DIAGNOSIS — F1910 Other psychoactive substance abuse, uncomplicated: Secondary | ICD-10-CM

## 2022-03-22 DIAGNOSIS — H109 Unspecified conjunctivitis: Secondary | ICD-10-CM | POA: Diagnosis not present

## 2022-03-22 DIAGNOSIS — D649 Anemia, unspecified: Secondary | ICD-10-CM

## 2022-03-22 DIAGNOSIS — L03116 Cellulitis of left lower limb: Secondary | ICD-10-CM

## 2022-03-22 DIAGNOSIS — E278 Other specified disorders of adrenal gland: Secondary | ICD-10-CM

## 2022-03-22 DIAGNOSIS — E0500 Thyrotoxicosis with diffuse goiter without thyrotoxic crisis or storm: Secondary | ICD-10-CM

## 2022-03-22 DIAGNOSIS — E871 Hypo-osmolality and hyponatremia: Secondary | ICD-10-CM

## 2022-03-22 DIAGNOSIS — R7989 Other specified abnormal findings of blood chemistry: Secondary | ICD-10-CM

## 2022-03-22 DIAGNOSIS — D696 Thrombocytopenia, unspecified: Secondary | ICD-10-CM

## 2022-03-22 DIAGNOSIS — E890 Postprocedural hypothyroidism: Secondary | ICD-10-CM

## 2022-03-22 LAB — COMPREHENSIVE METABOLIC PANEL
ALT: 39 U/L (ref 0–44)
AST: 56 U/L — ABNORMAL HIGH (ref 15–41)
Albumin: 3.8 g/dL (ref 3.5–5.0)
Alkaline Phosphatase: 49 U/L (ref 38–126)
Anion gap: 10 (ref 5–15)
BUN: 11 mg/dL (ref 8–23)
CO2: 26 mmol/L (ref 22–32)
Calcium: 8.7 mg/dL — ABNORMAL LOW (ref 8.9–10.3)
Chloride: 103 mmol/L (ref 98–111)
Creatinine, Ser: 1.09 mg/dL (ref 0.61–1.24)
GFR, Estimated: >60 mL/min (ref >60)
Glucose, Bld: 81 mg/dL (ref 70–99)
Potassium: 3.8 mmol/L (ref 3.5–5.1)
Sodium: 139 mmol/L (ref 135–145)
Total Bilirubin: 0.9 mg/dL (ref 0.3–1.2)
Total Protein: 6.9 g/dL (ref 6.5–8.1)

## 2022-03-22 LAB — MAGNESIUM: Magnesium: 1.9 mg/dL (ref 1.7–2.4)

## 2022-03-22 LAB — CBC
HCT: 37.2 % — ABNORMAL LOW (ref 39.0–52.0)
Hemoglobin: 12.7 g/dL — ABNORMAL LOW (ref 13.0–17.0)
MCH: 31.4 pg (ref 26.0–34.0)
MCHC: 34.1 g/dL (ref 30.0–36.0)
MCV: 91.9 fL (ref 80.0–100.0)
Platelets: 103 10*3/uL — ABNORMAL LOW (ref 150–400)
RBC: 4.05 MIL/uL — ABNORMAL LOW (ref 4.22–5.81)
RDW: 13.9 % (ref 11.5–15.5)
WBC: 4.8 10*3/uL (ref 4.0–10.5)
nRBC: 0 % (ref 0.0–0.2)

## 2022-03-22 MED ORDER — ERYTHROMYCIN 5 MG/GM OP OINT
TOPICAL_OINTMENT | Freq: Three times a day (TID) | OPHTHALMIC | Status: DC
  Administered 2022-03-22 – 2022-03-24 (×2): 1 via OPHTHALMIC
  Filled 2022-03-22: qty 1

## 2022-03-22 NOTE — Assessment & Plan Note (Signed)
Likely dilutional from beer consumption. ?resolved with IV hydration. ?

## 2022-03-22 NOTE — Assessment & Plan Note (Signed)
Benign in appearance. ?Follow-up with primary care provider. ?

## 2022-03-22 NOTE — Assessment & Plan Note (Addendum)
S/p treatment for Graves disease  ?TSH >20 and has bene trending upward to this, question adherence to outpatient medication  ?Continue levothyroxine 100 mcg p.o. daily. ?

## 2022-03-22 NOTE — Assessment & Plan Note (Signed)
D/t trauma (dog scratch, animal is known to patient) ?Appears more irritation related, no purulent drainage ?Starting erythromycin ointment and monitor closely  ?

## 2022-03-22 NOTE — Assessment & Plan Note (Signed)
CIWA protocol with lorazepam. ?Folate, MVI and thiamine supplementation. ?Magnesium sulfate has been given. ?

## 2022-03-22 NOTE — Assessment & Plan Note (Signed)
Monitor and replete prn  ?

## 2022-03-22 NOTE — Assessment & Plan Note (Signed)
Secondary to EtOH use. ?Alcohol cessation advised ?

## 2022-03-22 NOTE — Progress Notes (Signed)
? ? ?PROGRESS NOTE  ? ?Patient: Phillip Kemp 61 y.o. male ?MRN: [id number] ? ?Today is hospital day 1 after presenting to ED on 03/20/2022  7:48 PM with  ?Chief Complaint  ?Patient presents with  ? Leg Swelling  ? ?Phillip Kemp is a 61 y.o. male with medical history significant of alcoholic pancreatitis, alcohol dependence, osteoarthritis, chronic blood loss anemia, depression, history of SI, gout, hypothyroidism, hypothyroidism, history of rectal bleeding, polysubstance abuse who was initially seen at [facility] ED due to presenting there with bilateral lower extremity edema and wounds after wearing his compression stockings for 2 weeks stating that he was unable to remove them until 03/20/2022 when he noticed some drainage coming from his feet.  ? ? ?RECORD REVIEW AND HOSPITAL COURSE: ?03/20/22 to ED, 03/21/22 admitted for sepsis. ED tx w/ Zosyn, vancomycin and morphine. Hyponatremia, transaminitis relatively stable. Right foot x-ray showed soft tissue swelling without acute osseous abnormality.  Left foot showed soft tissue swelling with prior postsurgical hardware.  No bony erosive changes seen. See photos in media.  ?Today 03/22/22 reviewed chart, he was never meeting SIRS/SEPSIS criteria based on documented WBC, Temp, RR, HR, though his lactic acid levels were elevated these levels have trended down to 1.6. Pt feeling relatively well other than eye pain, leg pain.  ? ?Procedures and Significant Results:  ?none ? ?Consultants:  ?Wound care nurse  ? ? ? ?SUBJECTIVE:  ?Patient seen and examined resting comfortably in bed on medical floor, no apparent distress, he was sleeping when I walked in the room.  He awakes easily, states his legs are sore, denies fever, biggest complaint at this point is sore eyes where his dog scratched him.  ? ? ? ? ?ASSESSMENT & PLAN ? ?Sepsis (HCC) ?Present on admission per H&P but on record review, WBC never <4 or >12, Temp never <96.8 or >100.4, HR never >90, RR never >20, howveer given  elevated lactic acid and likely immune compromise given underlying liver disease and other comorbids, pt was started on broad spectrum abx ?Repeat CBC in AM ?Continue vital signs ?See a/p for cellulitis ? ?Cellulitis of both lower extremities ?Admitted to telemetry/inpatient. ?Continue IV fluids to transition to po  ?Continue wound care, see their consult note  ?Analgesics as needed ?Continue vancomycin/cefepime/metronidazole for cellulitis associated w/ pressure ulcers ?if WBC WNL in AM and VS remain appropriate, can hopefully deescalate abs tomorrow  ?Follow-up blood culture and sensitivity ?Trend AM CBC and BMP ? ?Alcohol use disorder, severe, dependence (HCC) ?CIWA protocol with lorazepam. ?Folate, MVI and thiamine supplementation. ?Magnesium sulfate has been given. ? ?Substance abuse (HCC) ?Consult TOC team. ?Substance abuse cessation recommended ? ?Normocytic anemia ?Monitor hematocrit and hemoglobin. ?Alcohol cessation. ? ?Hyponatremia ?Likely dilutional from beer consumption. ?resolved with IV hydration. ? ?Elevated LFTs ?Secondary to EtOH use. ?Alcohol cessation advised ? ?Thrombocytopenia (HCC) ?Due to alcohol use. ?Monitor platelet count. ? ?Hypothyroidism ?S/p treatment for Graves disease  ?TSH >20 and has bene trending upward to this, question adherence to outpatient medication  ?Continue levothyroxine 100 mcg p.o. daily. ? ?Hypomagnesemia ?Monitor and replete prn  ? ?Left adrenal mass (HCC) ?Benign in appearance. ?Follow-up with primary care provider. ? ?Conjunctivitis ?D/t trauma (dog scratch, animal is known to patient) ?Appears more irritation related, no purulent drainage ?Starting erythromycin ointment and monitor closely  ?  ? ?  ? ?VTE Ppx: thrombocytopenia precludes Rx Ppx, LE wounds preclude SCD/Ted hose ?CODE STATUS: FULL ?Admitted from: home ?Expected Dispo: TBD ?Barriers to discharge: continued emdical  treatment for cellulitis w/ IV antibiotics, CIWA protocol ?Family communication: pt  declined call to family/support person(s) ? ? ? ? ? ? ? ? ?Objective: ?Vital signs in last 24 hours: ?Temp:  [97.5 ?F (36.4 ?C)-99.5 ?F (37.5 ?C)] 98.5 ?F (36.9 ?C) (04/08 [id number]) ?Pulse Rate:  [58-69] 58 (04/08 0528) ?Resp:  [16-20] 19 (04/08 0528) ?BP: (142-168)/(69-84) 142/69 (04/08 0528) ?SpO2:  [94 %-98 %] 98 % (04/08 0528) ?Weight:  [82.5 kg] 82.5 kg (04/08 0500) ?Weight change: -2.323 kg ?Last BM Date : 03/21/22 ? ?Intake/Output from previous day: ?04/07 0701 - 04/08 0700 ?In: 2619.1 [P.O.:60; I.V.:1410.1; IV Piggyback:1149] ?Out: 2500 [Urine:2500] ?Intake/Output this shift: ?No intake/output data recorded. ? ?General appearance: alert and no distress ?Resp: clear to auscultation bilaterally ?Cardio: regular rate and rhythm, S1, S2 normal, no murmur, click, rub or gallop ?Extremities: no edema noted above area of bandages ?Skin: bandages in place on bilateral LE were not disturbed, no erythema proximal to bandages  ? ?Lab Results: ?Recent Labs  ?  03/20/22 ?1954 03/21/22 ?0603  ?WBC 8.0 4.2  ?HGB 11.9* 12.4*  ?HCT 34.8* 36.0*  ?PLT 119* 103*  ? ?BMET ?Recent Labs  ?  03/20/22 ?1954 03/21/22 ?0603  ?NA 130* 137  ?K 4.0 3.9  ?CL 92* 99  ?CO2 25 25  ?GLUCOSE 80 80  ?BUN 11 9  ?CREATININE 0.89 1.00  ?CALCIUM 9.6 9.0  ? ? ?Studies/Results: ?DG Foot 2 Views Left ? 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 Surgical defect in the distal first metatarsal is again seen. Distal soft tissue swelling is noted. No bony erosive changes are noted. IMPRESSION: Soft tissue swelling and prior postsurgical changes. Electronically Signed   By: Alcide CleverMark  Lukens M.D.   On: 03/20/2022 22:55  ? ?DG Foot 2 Views Right ? ?Result Date: 03/20/2022 ?CLINICAL DATA:  Bilateral foot pain and swelling, initial encounter EXAM: RIGHT FOOT - 2 VIEW COMPARISON:  10/08/2021 FINDINGS: Soft  tissue swelling is noted in the foot particularly along the dorsum of the foot. No acute fracture or dislocation is noted. No bony erosive changes are seen. IMPRESSION: Soft tissue swelling without acute bony abnormality. Electronically Signed   By: Alcide CleverMark  Lukens M.D.   On: 03/20/2022 22:54   ? ?Medications: I have reviewed the patient's current medications. ?Prior to Admission:  ?Medications Prior to Admission  ?Medication Sig Dispense Refill Last Dose  ? atenolol (TENORMIN) 25 MG tablet Take 25 mg by mouth daily.   03/20/2022 at 0900  ? cefpodoxime (VANTIN) 100 MG tablet Take 1 tablet (100 mg total) by mouth 2 (two) times daily. For 2days 4 tablet 0 03/20/2022  ? celecoxib (CELEBREX) 100 MG capsule Take 100 mg by mouth 2 (two) times daily.   03/20/2022  ? ferrous sulfate 325 (65 FE) MG tablet Take 325 mg by mouth daily with breakfast.   03/20/2022  ? furosemide (LASIX) 20 MG tablet Take 20 mg by mouth daily.   03/20/2022  ? ibuprofen (ADVIL,MOTRIN) 200 MG tablet Take 200-400 mg by mouth every 6 (six) hours as needed for mild pain, moderate pain or fever (for pain or headaches).   Past Month  ? levothyroxine (SYNTHROID, LEVOTHROID) 100 MCG tablet Take 1 tablet (100 mcg total) by mouth daily before breakfast. 30 tablet 0 03/20/2022  ? mirtazapine (REMERON) 30 MG tablet Take 30 mg by  mouth at bedtime.   Past Week  ? tiZANidine (ZANAFLEX) 4 MG tablet Take 4 mg by mouth 3 (three) times daily.   03/20/2022  ? EPINEPHrine 0.3 mg/0.3 mL IJ SOAJ injection Inject into the muscle.     ? ?Scheduled: ? atenolol  25 mg Oral Daily  ? celecoxib  100 mg Oral BID  ? enoxaparin (LOVENOX) injection  40 mg Subcutaneous Q24H  ? ferrous sulfate  325 mg Oral Q breakfast  ? folic acid  1 mg Oral Daily  ? levothyroxine  100 mcg Oral QAC breakfast  ? LORazepam  0-4 mg Oral Q4H  ? Followed by  ? [START ON 03/23/2022] LORazepam  0-4 mg Oral Q8H  ? mirtazapine  30 mg Oral QHS  ? multivitamin with minerals  1 tablet Oral Daily  ? thiamine  100 mg Oral Daily  ?  Or  ? thiamine  100 mg Intravenous Daily  ? ?Continuous: ? ceFEPime (MAXIPIME) IV 2 g (03/22/22 0610)  ? lactated ringers 125 mL/hr at 03/22/22 0126  ? metronidazole 500 mg (03/22/22 0858)  ? vancomycin 1,250 mg (

## 2022-03-22 NOTE — Assessment & Plan Note (Addendum)
Admitted to telemetry/inpatient. ?Continue wound care, wound nurse will see tomorrow (weekday)  ?Analgesics as needed ?Started 03/21/22 on vancomycin/cefepime/metronidazole for cellulitis associated w/ pressure ulcers  ?Blood Cx no growth at 2 days  ?Today, WBC stable (chronically on lower normal d/t chronic EtOH use), no concerns on BMP ?Will deescalate antibiotics to amoxicillin + doxycycline  and monitor for response  ?If continues to do well on po antibiotics and no advancement of cellulitis can possibly be d/c tomorrow  ? ? ?

## 2022-03-22 NOTE — Assessment & Plan Note (Signed)
Consult TOC team. ?Substance abuse cessation recommended ?

## 2022-03-22 NOTE — Assessment & Plan Note (Addendum)
Present on admission per H&P but on record review, WBC never <4 or >12, Temp never <96.8 or >100.4, HR never >90, RR never >20, howveer given elevated lactic acid and likely immune compromise given underlying liver disease and other comorbids, pt was started on broad spectrum abx ?Repeat CBC in AM ?Continue vital signs ?See a/p for cellulitis ?

## 2022-03-22 NOTE — Assessment & Plan Note (Signed)
Monitor hematocrit and hemoglobin. ?Alcohol cessation. ?

## 2022-03-22 NOTE — Assessment & Plan Note (Signed)
Due to alcohol use. ?Monitor platelet count. ?

## 2022-03-23 DIAGNOSIS — L03115 Cellulitis of right lower limb: Secondary | ICD-10-CM | POA: Diagnosis not present

## 2022-03-23 LAB — COMPREHENSIVE METABOLIC PANEL
ALT: 39 U/L (ref 0–44)
AST: 53 U/L — ABNORMAL HIGH (ref 15–41)
Albumin: 3.8 g/dL (ref 3.5–5.0)
Alkaline Phosphatase: 49 U/L (ref 38–126)
Anion gap: 8 (ref 5–15)
BUN: 12 mg/dL (ref 8–23)
CO2: 29 mmol/L (ref 22–32)
Calcium: 9.6 mg/dL (ref 8.9–10.3)
Chloride: 102 mmol/L (ref 98–111)
Creatinine, Ser: 1.07 mg/dL (ref 0.61–1.24)
GFR, Estimated: >60 mL/min (ref >60)
Glucose, Bld: 87 mg/dL (ref 70–99)
Potassium: 3.9 mmol/L (ref 3.5–5.1)
Sodium: 139 mmol/L (ref 135–145)
Total Bilirubin: 1 mg/dL (ref 0.3–1.2)
Total Protein: 7.1 g/dL (ref 6.5–8.1)

## 2022-03-23 LAB — CBC
HCT: 37.5 % — ABNORMAL LOW (ref 39.0–52.0)
Hemoglobin: 12.6 g/dL — ABNORMAL LOW (ref 13.0–17.0)
MCH: 31 pg (ref 26.0–34.0)
MCHC: 33.6 g/dL (ref 30.0–36.0)
MCV: 92.4 fL (ref 80.0–100.0)
Platelets: 106 10*3/uL — ABNORMAL LOW (ref 150–400)
RBC: 4.06 MIL/uL — ABNORMAL LOW (ref 4.22–5.81)
RDW: 13.9 % (ref 11.5–15.5)
WBC: 4.7 10*3/uL (ref 4.0–10.5)
nRBC: 0 % (ref 0.0–0.2)

## 2022-03-23 MED ORDER — PNEUMOCOCCAL 20-VAL CONJ VACC 0.5 ML IM SUSY
0.5000 mL | PREFILLED_SYRINGE | INTRAMUSCULAR | Status: DC
  Filled 2022-03-23: qty 0.5

## 2022-03-23 MED ORDER — DOXYCYCLINE HYCLATE 100 MG PO TABS
100.0000 mg | ORAL_TABLET | Freq: Two times a day (BID) | ORAL | Status: DC
  Administered 2022-03-23 – 2022-03-24 (×2): 100 mg via ORAL
  Filled 2022-03-23 (×2): qty 1

## 2022-03-23 MED ORDER — AMOXICILLIN 500 MG PO CAPS
500.0000 mg | ORAL_CAPSULE | Freq: Three times a day (TID) | ORAL | Status: DC
  Administered 2022-03-23 – 2022-03-24 (×3): 500 mg via ORAL
  Filled 2022-03-23 (×5): qty 1

## 2022-03-23 NOTE — Plan of Care (Signed)

## 2022-03-23 NOTE — Discharge Instructions (Addendum)
Outpatient Substance Use Treatment Services  ? ?[facility]  ?[facility]ensive Outpatient [facility] ?[address] ?[place], [place] [address]  ?[phone] ?Private insurance, Medicare A&B, and GCCN  ? ?[facility]  ?[address]  ?[place], [place] [address] ?[phone] ?Medicaid, Self Pay  ? ?[facility]                                      ?[address]  ?[place], [place] ?[phone] ?Medicaid and [employer], Self Pay  ? ?[facility] ?[address]  ?[place], [place]  ?[phone] ?Private Insurance, and Self Pay ? ?[facility]                                                ?[address]                   ?[place], [place] [address]  ?[phone] or [phone] ?Private Insurance Only  ? ?[facility] ?[address]  ?[place], [place] [address] ?[phone] ?Medicaid, Medicare, Private Insurance ? ?[facility] manager at the [employer] ?[address]  ?[place], [place] [address] ?[phone] ?Services are free or reduced ? ?[facility]  ?[address] ?[phone]  ?Self Pay only, sliding scale ? ?[facility]  ?[address]  ?[place], [place] [address] ?[phone] ?Chartered certified accountant) ?Self Pay, Medicaid Only  ? ?[facility] ?[address]  ?[place], [place] [address] ?[phone] ?Medicaid, Medicare, Private Insurance ? ? ? ? ? ?Residential Substance Use Treatment Services  ? ?[facility]  ?[address] [facility]  ?[place], [place] [address]  ?[phone] or [phone] ?Detox (Medicare, Medicaid, private insurance, and self pay)  ?Residential Rehab 14 days (Medicare, Medicaid, private insurance, and self pay)  ? ?[facility]  ?[address] [place], [place]  ?[phone]  ?Male and Male Detox (Self Pay and Medicaid limited availability)   ?Rehab only Male (Medicaid and self pay only)  ? ?[facility]                                                ?[address]  ?[place], [place] [address]  ?[phone] or [phone] ?Detox and Residential Treatment ?Private Insurance Only  ? ?[facility]  ?[address]  ?[place], [place] [address]  ?[phone]  ?Treatment Only, must make assessment appointment, and must be sober for assessment appointment.  ?Self Pay Only, Medicare A&B, [facility] [place] ID only! ?*Transportation assistance offered from [employer] on [employer] ? ?[facility]                                                ?[address] ?[place], [place] [address] ?Walk in interviews M-Sat 8-4p ?No pending legal charges ?[phone] ? ? ? ? ?[facility]:  [facility] ?Referral  ?[address] ?[place], [place] ?  [phone] ?(Self Pay, Medicaid) ? ?[facility] ?[address] ?[place], [place] [address] ?[phone] ?Detox and Residential Treatment ?Medicare and Private Insurance ? ?[facility] ?[address]  [place], [place] [address] ?28 Day Women's Facility: [phone] ?28 Day Men's Facility: [phone] ?Long-term Residential Program:  ?[phone] Males 25 and Over ?(No Insurance, upfront fee) ? ?[facility]  ?[address] ?[place], [place] [address] ?[phone] ?Private Insurance with [employer], Private Pay ? ?[facility] ?[address] ?[place], [place] [address] ?Local [phone] ?Private Insurance Only ? ?[facility] ?[address]  ?[place], [place] [address]  ?[phone] ?(Males, upfront fee) ? ?[facility] ?[address]  ?[place], [address] ?[phone] ?Private Insurance  ? ? ? ?Additional Discharge Instructions ? ? ?Please get your medications reviewed and adjusted by your Primary MD. ? ?Please request your Primary MD to go over all Hospital Tests and Procedure/Radiological results at the follow up, please get all Hospital records sent to your Prim MD by  signing hospital release before you go home. ? ?If you had Pneumonia of Lung problems at the Hospital: ?Please get a 2 view Chest X ray done in approximately 4 weeks after hospital discharge or sooner if instructed by your Primary MD. ? ?If you have Congestive Heart Failure: ?Please call your Cardiologist or Primary MD anytime you have any of the following symptoms:  ?1) 3 pound weight gain in 24 hours or 5 pounds in 1 week  ?2) shortness of breath, with or without a dry hacking cough  ?3) swelling in the hands, feet or stomach  ?4) if you have to sleep on extra pillows at night in order to breathe ? ?Follow cardiac low salt diet and 1.5 lit/day fluid restriction. ? ?If you have diabetes ?Accuchecks 4 times/day, Once in AM empty stomach and then before each meal. ?Log in all results and show them to your primary doctor at your next visit. ?If any glucose reading is under 80 or above 300 call your primary MD immediately. ? ?If you have Seizure/Convulsions/Epilepsy: ?Please do not drive, operate heavy machinery, participate in activities at heights or participate in high speed sports until you have seen by Primary MD or a Neurologist and advised to do so again. ?Per [facility] statutes, patients with seizures are not allowed to drive until they have been seizure-free for six months.  ?Use caution when using heavy equipment or power tools. Avoid working on ladders or at heights. Take showers instead of baths. Ensure the water temperature is not too high on the home water heater. Do not go swimming alone. Do not lock yourself in a room alone (i.e. bathroom). When caring for infants or small children, sit down when holding, feeding, or changing them to minimize risk of injury to the child in the event you have a seizure. Maintain good sleep hygiene. Avoid alcohol.  ? ?If you had Gastrointestinal Bleeding: ?Please ask your Primary MD to check a complete blood count within one week of discharge or at your next  visit. Your endoscopic/colonoscopic biopsies that are pending at the time of discharge, will also need to followed by your Primary MD. ? ?Get Medicines reviewed and adjusted. ?Please take all your medications with you for your next visit with your Primary MD ? ?Please request your Primary MD to go over all hospital tests and procedure/radiological results at the follow up, please ask your Primary MD to get all Hospital records sent to his/her office. ? ?If you experience worsening  of your admission symptoms, develop shortness of breath, life threatening emergency, suicidal or homicidal thoughts you must seek medical attention immediately by calling 911 or calling your MD immediately  if symptoms less severe. ? ?You must read complete instructions/literature along with all the possible adverse reactions/side effects for all the Medicines you take and that have been prescribed to you. Take any new Medicines after you have completely understood and accpet all the possible adverse reactions/side effects.  ? ?Do not drive or operate heavy machinery when taking Pain medications.  ? ?Do not take more than prescribed Pain, Sleep and Anxiety Medications ? ?Special Instructions: If you have smoked or chewed Tobacco  in the last 2 yrs please stop smoking, stop any regular Alcohol  and or any Recreational drug use. ? ?Wear Seat belts while driving. ? ?Please note ?You were cared for by a hospitalist during your hospital stay. If you have any questions about your discharge medications or the care you received while you were in the hospital after you are discharged, you can call the unit and asked to speak with the hospitalist on call if the hospitalist that took care of you is not available. Once you are discharged, your primary care physician will handle any further medical issues. Please note that NO REFILLS for any discharge medications will be authorized once you are discharged, as it is imperative that you return to your  primary care physician (or establish a relationship with a primary care physician if you do not have one) for your aftercare needs so that they can reassess your need for medications and monitor your lab values

## 2022-03-23 NOTE — TOC Initial Note (Signed)
Transition of Care (TOC) - Initial/Assessment Note  ? ? ?Patient Details  ?Name: Phillip Kemp ?MRN: [id number] ?Date of Birth: [birth year] ? ?Transition of Care (TOC) CM/SW Contact:    ?Cecille Po, RN ?Phone Number: ?03/23/2022, 3:08 PM ? ?Clinical Narrative:                 ? ?Spoke with patient.  Offered substance abuse resources and he agreed.  CM placed them on the AVS and patient aware. ?Patient states he does live alone.  Does not currently receive any home health services.  States his sister Steward Drone will help him when she can, but she works so can't help all the time.  ?TOC following. ? ? ?Expected Discharge Plan: Home w Home Health Services ?Barriers to Discharge: Continued Medical Work up ? ? ?Patient Goals and CMS Choice ?Patient states their goals for this hospitalization and ongoing recovery are:: return home ?  ?  ? ?Expected Discharge Plan and Services ?Expected Discharge Plan: Home w Home Health Services ?  ?  ?  ?Living arrangements for the past 2 months: Apartment ?                ? Prior Living Arrangements/Services ?Living arrangements for the past 2 months: Apartment ?Lives with:: Self ?Patient language and need for interpreter reviewed:: Yes ?       ?Need for Family Participation in Patient Care: Yes (Comment) ?Care giver support system in place?: Yes (comment) ?  ?Criminal Activity/Legal Involvement Pertinent to Current Situation/Hospitalization: No - Comment as needed ? ?Activities of Daily Living ?Home Assistive Devices/Equipment: None ?ADL Screening (condition at time of admission) ?Patient's cognitive ability adequate to safely complete daily activities?: Yes ?Is the patient deaf or have difficulty hearing?: No ?Does the patient have difficulty seeing, even when wearing glasses/contacts?: No ?Does the patient have difficulty concentrating, remembering, or making decisions?: No ?Patient able to express need for assistance with ADLs?: Yes ?Does the patient have difficulty dressing or  bathing?: No ?Independently performs ADLs?: Yes (appropriate for developmental age) ?Does the patient have difficulty walking or climbing stairs?: No ?Weakness of Legs: Both ?Weakness of Arms/Hands: None ? ?Emotional Assessment ?   ?Orientation: : Oriented to Self, Oriented to Place, Oriented to  Time, Oriented to Situation ?Alcohol / Substance Use: Alcohol Use, Illicit Drugs ?Psych Involvement: No (comment) ? ?Admission diagnosis:  Cellulitis of right lower extremity [L03.115] ?Cellulitis of both lower extremities [L03.115, L03.116] ?Patient Active Problem List  ? Diagnosis Date Noted  ? Conjunctivitis 03/22/2022  ? Hypomagnesemia 03/21/2022  ? Sepsis (HCC) 03/21/2022  ? Cellulitis of right lower extremity 03/20/2022  ? Leg swelling 01/07/2022  ? CAP (community acquired pneumonia) 02/21/2018  ? Community acquired pneumonia of left lower lobe of lung 02/19/2018  ? MDD (major depressive disorder), recurrent severe, without psychosis (HCC) 01/18/2016  ? Sleep disturbance 07/20/2015  ? Major depressive disorder, recurrent, severe without psychotic features (HCC)   ? Major depression, recurrent, chronic (HCC) 06/22/2015  ? Alcohol abuse 03/20/2015  ? Severe recurrent major depression without psychotic features (HCC) 03/20/2015  ? Suicidal ideations 03/20/2015  ? Hyperthyroidism 11/25/2014  ? Alcohol use disorder, severe, dependence (HCC) 11/22/2014  ? Hypothyroidism 11/22/2014  ? Graves' disease 11/22/2014  ? Bipolar 1 disorder, depressed, severe (HCC) 11/21/2014  ? Aspiration pneumonia (HCC) 07/15/2013  ? Hyponatremia 07/15/2013  ? Hypokalemia 07/15/2013  ? Elevated LFTs 07/15/2013  ? Microcytic anemia 07/15/2013  ? Thrombocytopenia (HCC) 07/15/2013  ? Thyromegaly 07/15/2013  ? Adrenal nodule,  left 07/15/2013  ? Chest pain, midsternal 09/24/2012  ? Papules 09/10/2012  ? Suicidal ideation 09/09/2012  ? Chronic blood loss anemia 09/09/2012  ? Acute alcoholic pancreatitis 09/08/2012  ? Rectal bleeding 09/08/2012  ?  Cocaine abuse (HCC) 09/08/2012  ? Normocytic anemia 09/08/2012  ? Left adrenal mass (HCC) 09/08/2012  ? Pseudogout of knee 08/27/2011  ? Substance abuse (HCC) 08/27/2011  ? Chronic pain 08/27/2011  ? ?PCP:  Ellyn Hack, MD ?Pharmacy:   ?[id number] - [place], [place] - [address] AT [facility] [address] ?[address] ?[place] [address] ?Phone: [phone] Fax: [phone] ? ?

## 2022-03-23 NOTE — Progress Notes (Signed)
? ? ?PROGRESS NOTE  ? ?Patient: Phillip Kemp 61 y.o. male ?MRN: [id number] ? ?Today is hospital day 2 after presenting to ED on 03/20/2022  7:48 PM with  ?Chief Complaint  ?Patient presents with  ? Leg Swelling  ? ?Phillip Kemp is a 61 y.o. male with medical history significant of alcoholic pancreatitis, alcohol dependence, osteoarthritis, chronic blood loss anemia, depression, history of SI, gout, hypothyroidism, hypothyroidism, history of rectal bleeding, polysubstance abuse who was initially seen at [facility] due to presenting there with bilateral lower extremity edema and wounds after wearing his compression stockings for 2 weeks stating that he was unable to remove them until 03/20/2022 when he noticed some drainage coming from his feet.  ? ? ?RECORD REVIEW AND HOSPITAL COURSE: ?03/20/22 to ED ?03/21/22 admitted for sepsis. ED tx w/ Zosyn, vancomycin and morphine. Hyponatremia, transaminitis relatively stable. Right foot x-ray showed soft tissue swelling without acute osseous abnormality.  Left foot showed soft tissue swelling with prior postsurgical hardware.  No bony erosive changes seen. See photos in media.  ?03/22/22 reviewed chart, he was never meeting SIRS/SEPSIS criteria based on documented WBC, Temp, RR, HR, though his lactic acid levels were elevated these levels have trended down to 1.6. Pt feeling relatively well other than eye pain, leg pain. Opted to continue broad spectrum abx on review of UpToDate re: pressure wound infections, plan to deescalate abx if WBC and VS remain acceptable, and pending blood cultures.   ?03/23/22: last dose Ativan per CIWA was yesterday afternoon. Blood culture showing no growth at 2 days, deescalate antibiotics to amoxicillin + doxycycline and monitor for response  ? ?Procedures and Significant Results:  ?none ? ?Consultants:  ?Wound care nurse  ? ? ? ?SUBJECTIVE:  ?Patient seen and examined resting comfortably in bed on medical floor, no apparent distress, he was sleeping  when I walked in the room.  He awakes easily, states legs are still sore but overall better. He states he's having chills every now and the which he attributes to EtOH "withdrawal" but he does not feel subjective fever  ? ? ? ? ?ASSESSMENT & PLAN ? ?Sepsis (HCC) ?Present on admission per H&P but on record review, WBC never <4 or >12, Temp never <96.8 or >100.4, HR never >90, RR never >20, howveer given elevated lactic acid and likely immune compromise given underlying liver disease and other comorbids, pt was started on broad spectrum abx ?Repeat CBC in AM ?Continue vital signs ?See a/p for cellulitis ? ?Cellulitis of right lower extremity ?Admitted to telemetry/inpatient. ?Continue wound care, wound nurse will see tomorrow (weekday)  ?Analgesics as needed ?Started 03/21/22 on vancomycin/cefepime/metronidazole for cellulitis associated w/ pressure ulcers  ?Blood Cx no growth at 2 days  ?Today, WBC stable (chronically on lower normal d/t chronic EtOH use), no concerns on BMP ?Will deescalate antibiotics to amoxicillin + doxycycline  and monitor for response  ?If continues to do well on po antibiotics and no advancement of cellulitis can possibly be d/c tomorrow  ? ? ? ?Alcohol use disorder, severe, dependence (HCC) ?CIWA protocol with lorazepam. ?Folate, MVI and thiamine supplementation. ?Magnesium sulfate has been given. ? ?Substance abuse (HCC) ?Consult TOC team. ?Substance abuse cessation recommended ? ?Normocytic anemia ?Monitor hematocrit and hemoglobin. ?Alcohol cessation. ? ?Hyponatremia ?Likely dilutional from beer consumption. ?resolved with IV hydration. ? ?Elevated LFTs ?Secondary to EtOH use. ?Alcohol cessation advised ? ?Thrombocytopenia (HCC) ?Due to alcohol use. ?Monitor platelet count. ? ?Hypothyroidism ?S/p treatment for Graves disease  ?TSH >20  and has bene trending upward to this, question adherence to outpatient medication  ?Continue levothyroxine 100 mcg p.o. daily. ? ?Hypomagnesemia ?Monitor and  replete prn  ? ?Left adrenal mass (HCC) ?Benign in appearance. ?Follow-up with primary care provider. ? ?Conjunctivitis ?D/t trauma (dog scratch, animal is known to patient) ?Appears more irritation related, no purulent drainage ?Starting erythromycin ointment and monitor closely  ?  ? ?  ? ?VTE Ppx: thrombocytopenia precludes Rx Ppx, LE wounds preclude SCD/Ted hose ?CODE STATUS: FULL ?Admitted from: home ?Expected Dispo: TBD ?Barriers to discharge: continued emdical treatment for cellulitis w/ IV antibiotics, CIWA protocol ?Family communication: pt declined call to family/support person(s) ? ? ? ? ? ? ? ? ?Objective: ?Vital signs in last 24 hours: ?Temp:  [97.9 ?F (36.6 ?C)-99.4 ?F (37.4 ?C)] 98.6 ?F (37 ?C) (04/09 1159) ?Pulse Rate:  [55-66] 64 (04/09 1159) ?Resp:  [14-18] 14 (04/09 1159) ?BP: (117-144)/(65-88) 144/88 (04/09 1159) ?SpO2:  [96 %-97 %] 96 % (04/09 1159) ?Weight:  [81.2 kg-83.6 kg] 81.2 kg (04/09 0500) ?Weight change: 1.1 kg ?Last BM Date : 03/22/22 ? ?Intake/Output from previous day: ?04/08 0701 - 04/09 0700 ?In: 3833.8 [P.O.:240; I.V.:2593.8; IV Piggyback:1000] ?Out: 2125 [Urine:2125] ?Intake/Output this shift: ?Total I/O ?In: 1205 [P.O.:1205] ?Out: 600 [Urine:600] ? ?General appearance: alert and no distress ?Resp: clear to auscultation bilaterally ?Cardio: regular rate and rhythm, S1, S2 normal, no murmur, click, rub or gallop ?Extremities: no edema noted above area of bandages ?Skin: bandages in place on bilateral LE were not disturbed, no erythema proximal to bandages  ? ?Lab Results: ?Recent Labs  ?  03/22/22 ?1030 03/23/22 ?0346  ?WBC 4.8 4.7  ?HGB 12.7* 12.6*  ?HCT 37.2* 37.5*  ?PLT 103* 106*  ? ? ?BMET ?Recent Labs  ?  03/22/22 ?1030 03/23/22 ?0346  ?NA 139 139  ?K 3.8 3.9  ?CL 103 102  ?CO2 26 29  ?GLUCOSE 81 87  ?BUN 11 12  ?CREATININE 1.09 1.07  ?CALCIUM 8.7* 9.6  ? ? ? ?Studies/Results: ?No results found. ? ?Medications: I have reviewed the patient's current medications. ?Prior to  Admission:  ?Medications Prior to Admission  ?Medication Sig Dispense Refill Last Dose  ? atenolol (TENORMIN) 25 MG tablet Take 25 mg by mouth daily.   03/20/2022 at 0900  ? cefpodoxime (VANTIN) 100 MG tablet Take 1 tablet (100 mg total) by mouth 2 (two) times daily. For 2days 4 tablet 0 03/20/2022  ? celecoxib (CELEBREX) 100 MG capsule Take 100 mg by mouth 2 (two) times daily.   03/20/2022  ? ferrous sulfate 325 (65 FE) MG tablet Take 325 mg by mouth daily with breakfast.   03/20/2022  ? furosemide (LASIX) 20 MG tablet Take 20 mg by mouth daily.   03/20/2022  ? ibuprofen (ADVIL,MOTRIN) 200 MG tablet Take 200-400 mg by mouth every 6 (six) hours as needed for mild pain, moderate pain or fever (for pain or headaches).   Past Month  ? levothyroxine (SYNTHROID, LEVOTHROID) 100 MCG tablet Take 1 tablet (100 mcg total) by mouth daily before breakfast. 30 tablet 0 03/20/2022  ? mirtazapine (REMERON) 30 MG tablet Take 30 mg by mouth at bedtime.   Past Week  ? tiZANidine (ZANAFLEX) 4 MG tablet Take 4 mg by mouth 3 (three) times daily.   03/20/2022  ? EPINEPHrine 0.3 mg/0.3 mL IJ SOAJ injection Inject into the muscle.     ? ?Scheduled: ? atenolol  25 mg Oral Daily  ? celecoxib  100 mg Oral BID  ?  enoxaparin (LOVENOX) injection  40 mg Subcutaneous Q24H  ? erythromycin   Both Eyes Q8H  ? ferrous sulfate  325 mg Oral Q breakfast  ? folic acid  1 mg Oral Daily  ? levothyroxine  100 mcg Oral QAC breakfast  ? LORazepam  0-4 mg Oral Q8H  ? mirtazapine  30 mg Oral QHS  ? multivitamin with minerals  1 tablet Oral Daily  ? thiamine  100 mg Oral Daily  ? Or  ? thiamine  100 mg Intravenous Daily  ? ?Continuous: ? ceFEPime (MAXIPIME) IV 2 g (03/23/22 0539)  ? lactated ringers 125 mL/hr at 03/23/22 0245  ? metronidazole 500 mg (03/23/22 1023)  ? vancomycin 1,250 mg (03/23/22 1219)  ? ?TDV:[id number] **OR** acetaminophen, LORazepam **OR** LORazepam, ondansetron (ZOFRAN) IV, oxyCODONE, tiZANidine ?Anti-infectives (From admission, onward)  ? ? Start      Dose/Rate Route Frequency Ordered Stop  ? 03/23/22 2200  doxycycline (VIBRA-TABS) tablet 100 mg       ? 100 mg Oral Every 12 hours 03/23/22 1620    ? 03/23/22 1630  amoxicillin (AMOXIL) capsule 500 mg

## 2022-03-24 MED ORDER — LEVOTHYROXINE SODIUM 112 MCG PO TABS: 112.0000 ug | ORAL_TABLET | Freq: Every day | ORAL | 0 refills | Status: AC

## 2022-03-24 MED ORDER — ERYTHROMYCIN 5 MG/GM OP OINT: TOPICAL_OINTMENT | Freq: Three times a day (TID) | OPHTHALMIC | 0 refills | Status: AC

## 2022-03-24 MED ORDER — EPINEPHRINE 0.3 MG/0.3ML IJ SOAJ
0.3000 mg | INTRAMUSCULAR | 0 refills | Status: AC | PRN
Start: 2022-03-24 — End: ?

## 2022-03-24 MED ORDER — AMOXICILLIN-POT CLAVULANATE 875-125 MG PO TABS: 1.0000 | ORAL_TABLET | Freq: Two times a day (BID) | ORAL | 0 refills | Status: AC

## 2022-03-24 MED ORDER — FOLIC ACID 1 MG PO TABS: 1.0000 mg | ORAL_TABLET | Freq: Every day | ORAL | 0 refills | Status: AC

## 2022-03-24 MED ORDER — DOXYCYCLINE HYCLATE 100 MG PO TABS
100.0000 mg | ORAL_TABLET | Freq: Two times a day (BID) | ORAL | 0 refills | Status: AC
Start: 2022-03-24 — End: 2022-03-28

## 2022-03-24 MED ORDER — ADULT MULTIVITAMIN W/MINERALS CH: 1.0000 | ORAL_TABLET | Freq: Every day | ORAL | Status: AC

## 2022-03-24 MED ORDER — THIAMINE HCL 100 MG PO TABS: 100.0000 mg | ORAL_TABLET | Freq: Every day | ORAL | 0 refills | Status: AC

## 2022-03-24 NOTE — TOC Transition Note (Signed)
Transition of Care (TOC) - CM/SW Discharge Note ? ? ?Patient Details  ?Name: Phillip Kemp ?MRN: [id number] ?Date of Birth: Mar 08, 1961 ? ?Transition of Care (TOC) CM/SW Contact:  ?Cecille Po, RN ?Phone Number: ?03/24/2022, 1:21 PM ? ? ?Clinical Narrative:    ? ?PT recommends outpatient PT.  [facility] to [facility].   ?PT also recommends a tall RW.  Referral sent to [employer].  Per [employer] rep, patient declines the RW, states he will just go buy a cane. ? ? ?Final next level of care: Home/Self Care ?Barriers to Discharge: Barriers Resolved ? ? ?Patient Goals and CMS Choice ?Patient states their goals for this hospitalization and ongoing recovery are:: return home ?  ?  ?

## 2022-03-24 NOTE — Evaluation (Signed)
Physical Therapy Evaluation ?Patient Details ?Name: Phillip Kemp ?MRN: [id number] ?DOB: 07-08-1961 ?Today's Date: 03/24/2022 ? ?History of Present Illness ? Patient is 61 y.o. male presented to [facility] ED for bil LE edema and wounds. PMH significant of alcoholic pancreatitis, alcohol dependence, osteoarthritis, chronic blood loss anemia, depression, history of SI, gout, hypothyroidism, hypothyroidism, history of rectal bleeding, polysubstance abuse. ? ?  ?Clinical Impression ? Phillip Kemp is 61 y.o. male admitted with above HPI and diagnosis. Patient is currently limited by functional impairments below (see PT problem list). Patient lives alone and is independent at baseline. He reports frequent falls in the last month and a history of balance impairments. He was able to ambulate ~200' with HHA progressing to min guard/supervision with no device and mild balance deficits noted during gait. Patient will benefit from continued skilled PT interventions to address impairments and progress independence with mobility, recommending OPPT follow up and RW. Due to pt's arm length limitations and height he will require a tall RW. Acute PT will follow and progress as able.    ?   ? ?Recommendations for follow up therapy are one component of a multi-disciplinary discharge planning process, led by the attending physician.  Recommendations may be updated based on patient status, additional functional criteria and insurance authorization. ? ?Follow Up Recommendations Outpatient PT ? ?  ?Assistance Recommended at Discharge PRN  ?Patient can return home with the following ? Help with stairs or ramp for entrance;Assist for transportation;Assistance with cooking/housework ? ?  ?Equipment Recommendations Rolling walker (2 wheels) (Tall RW (pt is 6'3" and has limited bil elbow extension)  ?Recommendations for Other Services ?    ?  ?Functional Status Assessment Patient has had a recent decline in their functional status and  demonstrates the ability to make significant improvements in function in a reasonable and predictable amount of time.  ? ?  ?Precautions / Restrictions Precautions ?Precautions: Fall ?Restrictions ?Weight Bearing Restrictions: No  ? ?  ? ?Mobility ? Bed Mobility ?Overal bed mobility: Modified Independent ?  ?  ?  ?  ?  ?  ?General bed mobility comments: HOB slightly elevated ?  ? ?Transfers ?Overall transfer level: Needs assistance ?Equipment used: None ?Transfers: Sit to/from Stand ?Sit to Stand: Supervision ?  ?  ?  ?  ?  ?General transfer comment: supervision for safety ?  ? ?Ambulation/Gait ?Ambulation/Gait assistance: Min assist, Min guard, Supervision ?Gait Distance (Feet): 200 Feet ?Assistive device: 1 person hand held assist, None ?Gait Pattern/deviations: Step-through pattern, Decreased stride length, Narrow base of support ?Gait velocity: decr ?  ?  ?General Gait Details: HHA at start for gait to steady pt. pt progressed to min guard and supervision with no UE support. pt drifts slightly without support but able to correct balance. ? ?Stairs ?  ?  ?  ?  ?  ? ?Wheelchair Mobility ?  ? ?Modified Rankin (Stroke Patients Only) ?  ? ?  ? ?Balance Overall balance assessment: History of Falls, Mild deficits observed, not formally tested ?  ?  ?  ?  ?  ?  ?  ?  ?  ?  ?  ?  ?  ?  ?  ?  ?  ?  ?   ? ? ? ?Pertinent Vitals/Pain Pain Assessment ?Pain Assessment: No/denies pain  ? ? ?Home Living Family/patient expects to be discharged to:: Private residence ?Living Arrangements: Alone ?Available Help at Discharge: Available PRN/intermittently (family/friends) ?Type of Home: Apartment ?Home Access: Stairs to  enter ?Entrance Stairs-Rails: Can reach both (has to bend over) ?Entrance Stairs-Number of Steps: 2 ?  ?Home Layout: One level ?  ?Additional Comments: has a son here and a sister. sister can provide transportation for him or he uses ub[employer] (20 rides/6 months)  ?  ?Prior Function Prior Level of Function :  Independent/Modified Independent ?  ?  ?  ?  ?  ?  ?  ?  ?  ? ? ?Hand Dominance  ? Dominant Hand: Right ? ?  ?Extremity/Trunk Assessment  ? Upper Extremity Assessment ?Upper Extremity Assessment:  (pt born with congenitally limited elbows, cannot extend past ~70-80 degress) ?  ? ?Lower Extremity Assessment ?Lower Extremity Assessment: Overall WFL for tasks assessed;LLE deficits/detail ?LLE Deficits / Details: Lt digit amputations ?  ? ?Cervical / Trunk Assessment ?Cervical / Trunk Assessment: Normal  ?Communication  ? Communication: No difficulties  ?Cognition Arousal/Alertness: Awake/alert ?Behavior During Therapy: [facility] for tasks assessed/performed ?Overall Cognitive Status: Within Functional Limits for tasks assessed ?  ?  ?  ?  ?  ?  ?  ?  ?  ?  ?  ?  ?  ?  ?  ?  ?  ?  ?  ? ?  ?General Comments   ? ?  ?Exercises    ? ?Assessment/Plan  ?  ?PT Assessment Patient needs continued PT services  ?PT Problem List Decreased activity tolerance;Decreased balance;Decreased mobility;Decreased knowledge of use of DME ? ?   ?  ?PT Treatment Interventions DME instruction;Stair training;Gait training;Functional mobility training;Therapeutic activities;Therapeutic exercise;Balance training;Patient/family education;Neuromuscular re-education   ? ?PT Goals (Current goals can be found in the Care Plan section)  ?Acute Rehab PT Goals ?Patient Stated Goal: improve balance ?PT Goal Formulation: With patient ?Time For Goal Achievement: 03/31/22 ?Potential to Achieve Goals: Good ? ?  ?Frequency Min 3X/week ?  ? ? ?Co-evaluation   ?  ?  ?  ?  ? ? ?  ?AM-PAC PT "6 Clicks" Mobility  ?Outcome Measure Help needed turning from your back to your side while in a flat bed without using bedrails?: None ?Help needed moving from lying on your back to sitting on the side of a flat bed without using bedrails?: None ?Help needed moving to and from a bed to a chair (including a wheelchair)?: A Little ?Help needed standing up from a chair using your arms  (e.g., wheelchair or bedside chair)?: A Little ?Help needed to walk in hospital room?: A Little ?Help needed climbing 3-5 steps with a railing? : A Little ?6 Click Score: 20 ? ?  ?End of Session Equipment Utilized During Treatment: Gait belt ?Activity Tolerance: Patient tolerated treatment well ?Patient left: in bed;with call bell/phone within reach ?Nurse Communication: Mobility status ?PT Visit Diagnosis: Unsteadiness on feet (R26.81);Other abnormalities of gait and mobility (R26.89);Difficulty in walking, not elsewhere classified (R26.2) ?  ? ?Time: 9758-8325 ?PT Time Calculation (min) (ACUTE ONLY): 15 min ? ? ?Charges:   PT Evaluation ?$PT Eval Low Complexity: 1 Low ?  ?  ?   ? ? ?Renaldo Fiddler PT, DPT ?Acute Rehabilitation Services ?Office [phone] ?Pager [phone]  ? ?Anitra Lauth ?03/24/2022, 11:38 AM ? ?

## 2022-03-24 NOTE — Plan of Care (Signed)
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
## 2022-03-26 LAB — CULTURE, BLOOD (ROUTINE X 2)
Culture: NO GROWTH
Culture: NO GROWTH

## 2022-08-29 ENCOUNTER — Encounter (HOSPITAL_COMMUNITY): Payer: Self-pay | Admitting: Emergency Medicine

## 2022-08-29 ENCOUNTER — Emergency Department (HOSPITAL_COMMUNITY): Payer: Medicare Other

## 2022-08-29 ENCOUNTER — Other Ambulatory Visit: Payer: Self-pay

## 2022-08-29 ENCOUNTER — Emergency Department (HOSPITAL_COMMUNITY)
Admission: EM | Admit: 2022-08-29 | Discharge: 2022-08-29 | Disposition: A | Discharge status: Other Acute Inpatient Hospital | Payer: Medicare Other | Attending: Student | Admitting: Student

## 2022-08-29 DIAGNOSIS — X58XXXA Exposure to other specified factors, initial encounter: Secondary | ICD-10-CM | POA: Insufficient documentation

## 2022-08-29 DIAGNOSIS — Z23 Encounter for immunization: Secondary | ICD-10-CM | POA: Diagnosis not present

## 2022-08-29 DIAGNOSIS — H44002 Unspecified purulent endophthalmitis, left eye: Secondary | ICD-10-CM | POA: Diagnosis not present

## 2022-08-29 DIAGNOSIS — Z87891 Personal history of nicotine dependence: Secondary | ICD-10-CM | POA: Insufficient documentation

## 2022-08-29 DIAGNOSIS — S0592XA Unspecified injury of left eye and orbit, initial encounter: Secondary | ICD-10-CM | POA: Diagnosis present

## 2022-08-29 DIAGNOSIS — S0522XA Ocular laceration and rupture with prolapse or loss of intraocular tissue, left eye, initial encounter: Secondary | ICD-10-CM | POA: Diagnosis not present

## 2022-08-29 DIAGNOSIS — E039 Hypothyroidism, unspecified: Secondary | ICD-10-CM | POA: Diagnosis not present

## 2022-08-29 DIAGNOSIS — S0532XA Ocular laceration without prolapse or loss of intraocular tissue, left eye, initial encounter: Secondary | ICD-10-CM

## 2022-08-29 LAB — COMPREHENSIVE METABOLIC PANEL
ALT: 36 U/L (ref 0–44)
AST: 55 U/L — ABNORMAL HIGH (ref 15–41)
Albumin: 4.3 g/dL (ref 3.5–5.0)
Alkaline Phosphatase: 57 U/L (ref 38–126)
Anion gap: 16 — ABNORMAL HIGH (ref 5–15)
BUN: 9 mg/dL (ref 8–23)
CO2: 19 mmol/L — ABNORMAL LOW (ref 22–32)
Calcium: 9 mg/dL (ref 8.9–10.3)
Chloride: 101 mmol/L (ref 98–111)
Creatinine, Ser: 1.14 mg/dL (ref 0.61–1.24)
GFR, Estimated: >60 mL/min (ref >60)
Glucose, Bld: 86 mg/dL (ref 70–99)
Potassium: 3.7 mmol/L (ref 3.5–5.1)
Sodium: 136 mmol/L (ref 135–145)
Total Bilirubin: 0.8 mg/dL (ref 0.3–1.2)
Total Protein: 7.7 g/dL (ref 6.5–8.1)

## 2022-08-29 LAB — CBC WITH DIFFERENTIAL/PLATELET
Abs Immature Granulocytes: 0.04 10*3/uL (ref 0.00–0.07)
Basophils Absolute: 0.1 10*3/uL (ref 0.0–0.1)
Basophils Relative: 1 %
Eosinophils Absolute: 0 10*3/uL (ref 0.0–0.5)
Eosinophils Relative: 0 %
HCT: 38.4 % — ABNORMAL LOW (ref 39.0–52.0)
Hemoglobin: 13.1 g/dL (ref 13.0–17.0)
Immature Granulocytes: 1 %
Lymphocytes Relative: 24 %
Lymphs Abs: 2 10*3/uL (ref 0.7–4.0)
MCH: 31.6 pg (ref 26.0–34.0)
MCHC: 34.1 g/dL (ref 30.0–36.0)
MCV: 92.8 fL (ref 80.0–100.0)
Monocytes Absolute: 0.6 10*3/uL (ref 0.1–1.0)
Monocytes Relative: 7 %
Neutro Abs: 5.7 10*3/uL (ref 1.7–7.7)
Neutrophils Relative %: 67 %
Platelets: 146 10*3/uL — ABNORMAL LOW (ref 150–400)
RBC: 4.14 MIL/uL — ABNORMAL LOW (ref 4.22–5.81)
RDW: 14.4 % (ref 11.5–15.5)
WBC: 8.5 10*3/uL (ref 4.0–10.5)
nRBC: 0 % (ref 0.0–0.2)

## 2022-08-29 LAB — LACTIC ACID, PLASMA: Lactic Acid, Venous: 2.8 mmol/L (ref 0.5–1.9)

## 2022-08-29 MED ORDER — MORPHINE SULFATE (PF) 4 MG/ML IV SOLN
4.0000 mg | Freq: Once | INTRAVENOUS | Status: AC
  Administered 2022-08-29: 4 mg via INTRAVENOUS
  Filled 2022-08-29: qty 1

## 2022-08-29 MED ORDER — SODIUM CHLORIDE 0.9 % IV SOLN: INTRAVENOUS | Status: DC

## 2022-08-29 MED ORDER — VANCOMYCIN HCL 1500 MG/300ML IV SOLN
1500.0000 mg | Freq: Once | INTRAVENOUS | Status: DC
  Filled 2022-08-29: qty 300

## 2022-08-29 MED ORDER — IOHEXOL 350 MG/ML SOLN
75.0000 mL | Freq: Once | INTRAVENOUS | Status: AC | PRN
  Administered 2022-08-29: 75 mL via INTRAVENOUS

## 2022-08-29 MED ORDER — LEVOFLOXACIN IN D5W 750 MG/150ML IV SOLN
750.0000 mg | INTRAVENOUS | Status: DC
  Administered 2022-08-29: 750 mg via INTRAVENOUS
  Filled 2022-08-29: qty 150

## 2022-08-29 MED ORDER — HYDROMORPHONE HCL 1 MG/ML IJ SOLN
0.5000 mg | Freq: Once | INTRAMUSCULAR | Status: AC
  Administered 2022-08-29: 0.5 mg via INTRAVENOUS
  Filled 2022-08-29: qty 1

## 2022-08-29 MED ORDER — ONDANSETRON HCL 4 MG/2ML IJ SOLN
4.0000 mg | Freq: Once | INTRAMUSCULAR | Status: AC
  Administered 2022-08-29: 4 mg via INTRAVENOUS
  Filled 2022-08-29: qty 2

## 2022-08-29 MED ORDER — SODIUM CHLORIDE 0.9 % IV SOLN
2.0000 g | Freq: Once | INTRAVENOUS | Status: AC
  Administered 2022-08-29: 2 g via INTRAVENOUS
  Filled 2022-08-29: qty 12.5

## 2022-08-29 MED ORDER — TETANUS-DIPHTH-ACELL PERTUSSIS 5-2.5-18.5 LF-MCG/0.5 IM SUSY
0.5000 mL | PREFILLED_SYRINGE | Freq: Once | INTRAMUSCULAR | Status: AC
  Administered 2022-08-29: 0.5 mL via INTRAMUSCULAR
  Filled 2022-08-29: qty 0.5

## 2022-08-29 NOTE — ED Notes (Signed)
Pt transported to CT ?

## 2022-08-29 NOTE — ED Notes (Signed)
Attempted for IV, no success. Another RN will attempt.

## 2022-08-29 NOTE — ED Notes (Signed)
Pt ambulated with steady gait with IV pole to restroom.

## 2022-08-29 NOTE — ED Triage Notes (Signed)
Patient from home, patient with left eye swelling, and pain with drainage from the eye.  He stated that the swelling started 5 days ago, woke up with no vision.

## 2022-08-29 NOTE — ED Notes (Signed)
Spoke with [facility] for ED to ED transfer [facility] @ [phone]). Faxed face sheet to Joy at [phone].  Accepting Physician will be as follows: Josephine Cables, MD  Report should be given to [facility] ER Charge Nurse at [phone].  Transferred Joy to Dr. Tanda Rockers per her request.
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
## 2022-08-29 NOTE — ED Provider Triage Note (Addendum)
Emergency Medicine Provider Triage Evaluation Note  Phillip Kemp , a 61 y.o. male  was evaluated in triage.  Pt complains of severe right eye pain, swelling, discharge, and loss of vision x 5 days..  Review of Systems  Positive: See above Negative: fever  Physical Exam  BP (!) 132/102 (BP Location: Right Arm)   Pulse 89   Temp 98.4 F (36.9 C) (Oral)   Resp 18   SpO2 100%  Gen:   Awake, no distress   Resp:  Normal effort  MSK:   Moves extremities without difficulty  Other:  left eye as photo below, anterior chamber completely obscured with purulence, patient can only sense light and dark in the left eye  Medical Decision Making  Medically screening exam initiated at 5:40 AM.  Appropriate orders placed.  Phillip Kemp was informed that the remainder of the evaluation will be completed by another provider, this initial triage assessment does not replace that evaluation, and the importance of remaining in the ED until their evaluation is complete.  Patient to be roomed ASAP, charge RN aware, going to Hallway bed 15. CT and labs ordered. Will require emergent ophthalmologic consultation.  This chart was dictated using voice recognition software, Dragon. Despite the best efforts of this provider to proofread and correct errors, errors may still occur which can change documentation meaning.    Paris Lore, PA-C 08/29/22 0542    Yaretzy Olazabal, Eugene Gavia, PA-C 08/29/22 0543
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
## 2022-09-04 ENCOUNTER — Ambulatory Visit: Payer: Medicare Other | Admitting: Podiatry

## 2022-09-17 ENCOUNTER — Ambulatory Visit: Payer: Medicare Other | Admitting: Podiatry

## 2023-05-27 ENCOUNTER — Ambulatory Visit (INDEPENDENT_AMBULATORY_CARE_PROVIDER_SITE_OTHER): Payer: 59 | Admitting: Podiatry

## 2023-05-27 DIAGNOSIS — Z91199 Patient's noncompliance with other medical treatment and regimen due to unspecified reason: Secondary | ICD-10-CM

## 2023-05-27 NOTE — Progress Notes (Signed)
No show

## 2023-07-13 ENCOUNTER — Ambulatory Visit: Payer: 59 | Admitting: Podiatry

## 2023-07-20 ENCOUNTER — Ambulatory Visit (INDEPENDENT_AMBULATORY_CARE_PROVIDER_SITE_OTHER): Payer: 59 | Admitting: Podiatry

## 2023-07-20 DIAGNOSIS — Z91199 Patient's noncompliance with other medical treatment and regimen due to unspecified reason: Secondary | ICD-10-CM

## 2023-07-20 NOTE — Progress Notes (Signed)
No show

## 2023-09-17 ENCOUNTER — Ambulatory Visit: Payer: 59 | Admitting: Podiatry

## 2023-09-17 DIAGNOSIS — Z91199 Patient's noncompliance with other medical treatment and regimen due to unspecified reason: Secondary | ICD-10-CM

## 2023-09-17 NOTE — Progress Notes (Signed)
 Patient absent for apointment

## 2023-10-20 ENCOUNTER — Other Ambulatory Visit: Payer: Self-pay

## 2023-10-20 ENCOUNTER — Emergency Department (HOSPITAL_COMMUNITY): Payer: 59

## 2023-10-20 ENCOUNTER — Emergency Department (HOSPITAL_COMMUNITY)
Admission: EM | Admit: 2023-10-20 | Discharge: 2023-10-20 | Disposition: A | Discharge status: Home / Self Care | Payer: 59 | Attending: Emergency Medicine | Admitting: Emergency Medicine

## 2023-10-20 DIAGNOSIS — R519 Headache, unspecified: Secondary | ICD-10-CM | POA: Diagnosis not present

## 2023-10-20 DIAGNOSIS — H409 Unspecified glaucoma: Secondary | ICD-10-CM | POA: Diagnosis not present

## 2023-10-20 DIAGNOSIS — E039 Hypothyroidism, unspecified: Secondary | ICD-10-CM | POA: Diagnosis not present

## 2023-10-20 DIAGNOSIS — H5712 Ocular pain, left eye: Secondary | ICD-10-CM | POA: Diagnosis present

## 2023-10-20 MED ORDER — TETRACAINE HCL 0.5 % OP SOLN
2.0000 [drp] | Freq: Once | OPHTHALMIC | Status: AC
  Administered 2023-10-20: 2 [drp] via OPHTHALMIC
  Filled 2023-10-20: qty 4

## 2023-10-20 MED ORDER — METOCLOPRAMIDE HCL 5 MG/ML IJ SOLN
10.0000 mg | Freq: Once | INTRAMUSCULAR | Status: DC
  Filled 2023-10-20: qty 2

## 2023-10-20 MED ORDER — ACETAMINOPHEN 325 MG PO TABS
650.0000 mg | ORAL_TABLET | Freq: Once | ORAL | Status: AC
  Administered 2023-10-20: 650 mg via ORAL
  Filled 2023-10-20: qty 2

## 2023-10-20 MED ORDER — ERYTHROMYCIN 5 MG/GM OP OINT: TOPICAL_OINTMENT | OPHTHALMIC | 0 refills | Status: AC

## 2023-10-20 MED ORDER — DIPHENHYDRAMINE HCL 50 MG/ML IJ SOLN
25.0000 mg | Freq: Once | INTRAMUSCULAR | Status: DC
  Filled 2023-10-20: qty 1

## 2023-10-20 MED ORDER — DORZOLAMIDE HCL-TIMOLOL MAL 2-0.5 % OP SOLN
1.0000 [drp] | Freq: Two times a day (BID) | OPHTHALMIC | Status: DC
  Administered 2023-10-20: 1 [drp] via OPHTHALMIC
  Filled 2023-10-20: qty 10

## 2023-10-20 MED ORDER — FLUORESCEIN SODIUM 1 MG OP STRP
1.0000 | ORAL_STRIP | Freq: Once | OPHTHALMIC | Status: AC
  Administered 2023-10-20: 1 via OPHTHALMIC
  Filled 2023-10-20: qty 1

## 2023-10-20 MED ORDER — METOCLOPRAMIDE HCL 10 MG PO TABS
5.0000 mg | ORAL_TABLET | Freq: Once | ORAL | Status: AC
  Administered 2023-10-20: 5 mg via ORAL
  Filled 2023-10-20: qty 1

## 2023-10-20 MED ORDER — DORZOLAMIDE HCL-TIMOLOL MAL 2-0.5 % OP SOLN: 1.0000 [drp] | Freq: Two times a day (BID) | OPHTHALMIC | 12 refills | Status: AC

## 2023-10-20 MED ORDER — DIPHENHYDRAMINE HCL 25 MG PO CAPS
25.0000 mg | ORAL_CAPSULE | Freq: Once | ORAL | Status: AC
  Administered 2023-10-20: 25 mg via ORAL
  Filled 2023-10-20: qty 1

## 2023-10-20 NOTE — ED Provider Notes (Signed)
[place] EMERGENCY DEPARTMENT AT [facility] Provider Note   CSN: [id number] Arrival date & time: 10/20/23  1720     History  Chief Complaint  Patient presents with   Eye Pain    Phillip Kemp is a 62 y.o. male patient with past medical history of hypothyroidism, substance abuse, history of corneal perforation presenting to the emergency room with worsening left eye pain and left sided headache.  Patient reports he has had a headache since he woke up this morning does not feel like a typical headache feels worse than normal.  Patient reports that the pain is getting more severe.  He also reports that he ran out of supply drops from ophthalmology where he was seen at [facility].  Patient denies any other associated symptoms.  Patient reports a fall 1 week ago he was drunk so he does not remember but reports headache did not start at that time.  Does not endorse any neck pain.  Does not report any acute change in vision.   Eye Pain       Home Medications Prior to Admission medications   Medication Sig Start Date End Date Taking? Authorizing Provider  atenolol (TENORMIN) 25 MG tablet Take 25 mg by mouth daily. 03/19/16   [provider]  celecoxib (CELEBREX) 100 MG capsule Take 100 mg by mouth 2 (two) times daily. 10/01/21   [provider]  EPINEPHrine 0.3 mg/0.3 mL IJ SOAJ injection Inject 0.3 mg into the muscle as needed for anaphylaxis. 03/24/22   Hongalgi, Maximino Greenland, MD  ferrous sulfate 325 (65 FE) MG tablet Take 325 mg by mouth daily with breakfast.    [provider]  folic acid (FOLVITE) 1 MG tablet Take 1 tablet (1 mg total) by mouth daily. 03/25/22   Hongalgi, Maximino Greenland, MD  furosemide (LASIX) 20 MG tablet Take 20 mg by mouth daily. 07/01/21   [provider]  levothyroxine (SYNTHROID) 112 MCG tablet Take 1 tablet (112 mcg total) by mouth daily before breakfast. 03/24/22   Hongalgi, Maximino Greenland, MD  mirtazapine (REMERON) 30 MG tablet  Take 30 mg by mouth at bedtime. 07/03/15   [provider]  Multiple Vitamin (MULTIVITAMIN WITH MINERALS) TABS tablet Take 1 tablet by mouth daily. 03/25/22   Hongalgi, Maximino Greenland, MD  thiamine 100 MG tablet Take 1 tablet (100 mg total) by mouth daily. 03/25/22   Hongalgi, Maximino Greenland, MD  tiZANidine (ZANAFLEX) 4 MG tablet Take 4 mg by mouth 3 (three) times daily. 09/27/21   [provider]      Allergies    Bee venom    Review of Systems   Review of Systems  Eyes:  Positive for pain.    Physical Exam Updated Vital Signs BP 113/67 (BP Location: Left Arm)   Pulse 73   Temp 98.6 F (37 C) (Oral)   Resp 16   Ht 6\' 3"  (1.905 m)   Wt 80.7 kg   SpO2 99%   BMI 22.25 kg/m  Physical Exam Vitals and nursing note reviewed.  Constitutional:      General: He is not in acute distress.    Appearance: He is not toxic-appearing.  HENT:     Head: Normocephalic and atraumatic.  Eyes:     General: No scleral icterus.    Conjunctiva/sclera: Conjunctivae normal.     Comments: Left eye cloudy, no drainage, patient reports no vision in this eye  Cardiovascular:     Rate and  Rhythm: Normal rate and regular rhythm.     Pulses: Normal pulses.     Heart sounds: Normal heart sounds.  Pulmonary:     Effort: Pulmonary effort is normal. No respiratory distress.     Breath sounds: Normal breath sounds.  Abdominal:     General: Abdomen is flat. Bowel sounds are normal.     Palpations: Abdomen is soft.     Tenderness: There is no abdominal tenderness.  Skin:    General: Skin is warm and dry.     Findings: No lesion.  Neurological:     General: No focal deficit present.     Mental Status: He is alert and oriented to person, place, and time. Mental status is at baseline.     ED Results / Procedures / Treatments   Labs (all labs ordered are listed, but only abnormal results are displayed) Labs Reviewed  CBC  BASIC METABOLIC PANEL    EKG None  Radiology No results  found.  Procedures Procedures    Medications Ordered in ED Medications - No data to display  ED Course/ Medical Decision Making/ A&P Clinical Course as of 10/20/23 2335  Tue Oct 20, 2023  2146 Drop administered, will recheck pressure after [JB]    Clinical Course User Index [JB] [name], Horald Chestnut, PA-C                                 Medical Decision Making Amount and/or Complexity of Data Reviewed Labs: ordered. Radiology: ordered.  Risk OTC drugs. Prescription drug management.   [name] 62 y.o. presented today for HA. Working Ddx: tension headache, migraine, intracranial mass, intracranial hemorrhage, intracranial infection including meningitis vs encephalitis, trigeminal neuralgia, AVM, sinusitis, cerebral aneurysm, muscular headache, cavernous sinus thrombosis, carotid artery dissection.  R/o DDx: intracranial mass, hemorrhage,or infection including meningitis vs encephalitis, trigeminal neuralgia, AVM, cerebral aneurysm, muscular headache, cavernous sinus thrombosis, carotid artery dissection are less likely due to history of present illness, physical exam, labs/imaging findings  PMHX: hypothyroidism, substance abuse, history of corneal perforation  Review of prior external notes: 08/03/2023  Unique Tests and My Interpretation:  Considered but not obtained   CT Head w/o Contrast: no acute intracranial pathology   Problem List / ED Course / Critical interventions / Medication management  Patient reporting to emergency room with left eye pain that is chronic, following with ophthalmology for this.  Patient has referral to another clinic for his eye pain, yet to follow up.  Started experiencing left sided headache when he woke up this morning. Patient reports headache was very severe thus head CT ordered.  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 Patient has been out of medications for glaucoma, documented by [facility] ophth Cosopt drops - will administer and re-check. Urgent Ophth consult not obtained as patient is already blind and has documented history.  Patient had improvement of   I ordered medication including timolol eye drop Reevaluation of the patient after these medicines showed that the patient improved Patients vitals assessed. Upon arrival patient is hemodynamically stable.  I have reviewed the patients home medicines and have made adjustments as needed    Plan: Needs to see ophthalmology, patient agreed to follow up. Medication refill sent to pharmacy.  Patient left AMA, attempted  to contact patient Apparently patient told nurse he was feeling better and was okay to leave. Patient left without having IOP rechecked          Final Clinical Impression(s) / ED Diagnoses Final diagnoses:  Glaucoma of left eye, unspecified glaucoma type    Rx / DC Orders ED Discharge Orders     None         Smitty Knudsen, PA-C 10/20/23 2349    Royanne Foots, DO 10/27/23 1148

## 2023-10-20 NOTE — ED Notes (Signed)
Patient stated he needs to leave and catch his uber ride. RN stated if he can wait with PA. PA informed. Patient still left before PA talk to her.

## 2023-10-20 NOTE — ED Triage Notes (Signed)
Pt BIBA from home. C/o L eye pain- pt ran out of their eye drops several days ago. Pt had severe eye injury a year ago and is totally blind in that eye.

## 2023-10-28 ENCOUNTER — Other Ambulatory Visit: Payer: Self-pay | Admitting: Family Medicine

## 2023-10-28 DIAGNOSIS — K7469 Other cirrhosis of liver: Secondary | ICD-10-CM

## 2023-11-04 ENCOUNTER — Ambulatory Visit
Admission: RE | Admit: 2023-11-04 | Discharge: 2023-11-04 | Disposition: A | Discharge status: Home / Self Care | Payer: 59 | Source: Ambulatory Visit | Attending: Family Medicine | Admitting: Family Medicine

## 2023-11-04 DIAGNOSIS — K7469 Other cirrhosis of liver: Secondary | ICD-10-CM

## 2024-03-23 ENCOUNTER — Ambulatory Visit: Admitting: Podiatry

## 2024-08-05 ENCOUNTER — Other Ambulatory Visit: Payer: Self-pay

## 2024-08-05 ENCOUNTER — Emergency Department (EMERGENCY_DEPARTMENT_HOSPITAL)

## 2024-08-05 ENCOUNTER — Encounter (HOSPITAL_COMMUNITY): Payer: Self-pay

## 2024-08-05 ENCOUNTER — Emergency Department (HOSPITAL_COMMUNITY)
Admission: EM | Admit: 2024-08-05 | Discharge: 2024-08-05 | Disposition: A | Discharge status: Home / Self Care | Attending: Emergency Medicine | Admitting: Emergency Medicine

## 2024-08-05 DIAGNOSIS — E162 Hypoglycemia, unspecified: Secondary | ICD-10-CM | POA: Diagnosis not present

## 2024-08-05 DIAGNOSIS — I739 Peripheral vascular disease, unspecified: Secondary | ICD-10-CM | POA: Insufficient documentation

## 2024-08-05 DIAGNOSIS — R2243 Localized swelling, mass and lump, lower limb, bilateral: Secondary | ICD-10-CM | POA: Diagnosis present

## 2024-08-05 DIAGNOSIS — M7989 Other specified soft tissue disorders: Secondary | ICD-10-CM | POA: Diagnosis not present

## 2024-08-05 DIAGNOSIS — E878 Other disorders of electrolyte and fluid balance, not elsewhere classified: Secondary | ICD-10-CM | POA: Diagnosis not present

## 2024-08-05 DIAGNOSIS — M79604 Pain in right leg: Secondary | ICD-10-CM

## 2024-08-05 LAB — CBC WITH DIFFERENTIAL/PLATELET
Abs Immature Granulocytes: 0.04 K/uL (ref 0.00–0.07)
Basophils Absolute: 0.1 K/uL (ref 0.0–0.1)
Basophils Relative: 1 %
Eosinophils Absolute: 0.2 K/uL (ref 0.0–0.5)
Eosinophils Relative: 2 %
HCT: 40.6 % (ref 39.0–52.0)
Hemoglobin: 13.6 g/dL (ref 13.0–17.0)
Immature Granulocytes: 1 %
Lymphocytes Relative: 16 %
Lymphs Abs: 1.3 K/uL (ref 0.7–4.0)
MCH: 30.8 pg (ref 26.0–34.0)
MCHC: 33.5 g/dL (ref 30.0–36.0)
MCV: 92.1 fL (ref 80.0–100.0)
Monocytes Absolute: 0.8 K/uL (ref 0.1–1.0)
Monocytes Relative: 11 %
Neutro Abs: 5.6 K/uL (ref 1.7–7.7)
Neutrophils Relative %: 69 %
Platelets: 155 K/uL (ref 150–400)
RBC: 4.41 MIL/uL (ref 4.22–5.81)
RDW: 13.2 % (ref 11.5–15.5)
WBC: 8 K/uL (ref 4.0–10.5)
nRBC: 0 % (ref 0.0–0.2)

## 2024-08-05 LAB — BASIC METABOLIC PANEL WITH GFR
Anion gap: 16 — ABNORMAL HIGH (ref 5–15)
BUN: 15 mg/dL (ref 8–23)
CO2: 21 mmol/L — ABNORMAL LOW (ref 22–32)
Calcium: 9.4 mg/dL (ref 8.9–10.3)
Chloride: 105 mmol/L (ref 98–111)
Creatinine, Ser: 1.02 mg/dL (ref 0.61–1.24)
GFR, Estimated: >60 mL/min (ref >60)
Glucose, Bld: 68 mg/dL — ABNORMAL LOW (ref 70–99)
Potassium: 3.9 mmol/L (ref 3.5–5.1)
Sodium: 142 mmol/L (ref 135–145)

## 2024-08-05 MED ORDER — OXYCODONE-ACETAMINOPHEN 5-325 MG PO TABS
1.0000 | ORAL_TABLET | Freq: Once | ORAL | Status: DC
  Filled 2024-08-05: qty 1

## 2024-08-05 NOTE — ED Triage Notes (Signed)
 Patient BIB EMS from home c/o bilateral leg pain, redness, and swelling that has been going on for several months, but it has been worst for the past 2 weeks. Patient did go [facility] yesterday and got a referral to podiatry, but the referral is not in yet so he decided to get checked out here today.

## 2024-08-05 NOTE — Discharge Instructions (Signed)
 You were seen in the emergency department for evaluation of a possible blood clot in your legs.  You had ultrasound of your legs that did not show any signs of blood clot.  I am concerned that you have poor blood flow to your lower legs.  Please follow-up with podiatry and your primary care doctor as scheduled.  Return to the emergency department if any worsening or concerning symptoms

## 2024-08-05 NOTE — ED Provider Notes (Signed)
 [place] EMERGENCY DEPARTMENT AT [facility] Provider Note   CSN: [id number] Arrival date & time: 08/05/24  1001     Patient presents with: Leg Pain   Phillip Kemp is a 63 y.o. male.  He is presenting by ambulance from home with complaint of pain swelling of both of his lower legs.  Symptoms have been going on a long time but have been worse recently.  He has had prior surgery on both of his legs.  He said visiting nurse stop by yesterday did some blood work and is referring him to podiatry.  They wanted to get evaluated for a blood clot though.  No chest pain or shortness of breath no fevers or chills.  He said his legs hurt worse when he walks on him.   The history is provided by the patient.  Leg Pain Location:  Leg Injury: no   Leg location:  L lower leg and R lower leg Pain details:    Quality:  Aching Chronicity:  Chronic Worsened by:  Bearing weight Ineffective treatments:  None tried Associated symptoms: numbness and swelling   Associated symptoms: no fever        Prior to Admission medications   Medication Sig Start Date End Date Taking? Authorizing Provider  atenolol  (TENORMIN ) 25 MG tablet Take 25 mg by mouth daily. 03/19/16   [provider]  celecoxib  (CELEBREX ) 100 MG capsule Take 100 mg by mouth 2 (two) times daily. 10/01/21   [provider]  dorzolamide -timolol  (COSOPT ) 2-0.5 % ophthalmic solution Place 1 drop into the left eye 2 (two) times daily. 10/20/23   Barrett, Warren SAILOR, PA-C  EPINEPHrine  0.3 mg/0.3 mL IJ SOAJ injection Inject 0.3 mg into the muscle as needed for anaphylaxis. 03/24/22   Hongalgi, Trenda BIRCH, MD  ferrous sulfate  325 (65 FE) MG tablet Take 325 mg by mouth daily with breakfast.    [provider]  folic acid  (FOLVITE ) 1 MG tablet Take 1 tablet (1 mg total) by mouth daily. 03/25/22   Hongalgi, Anand D, MD  furosemide (LASIX) 20 MG tablet Take 20 mg by mouth daily. 07/01/21   [provider]   levothyroxine  (SYNTHROID ) 112 MCG tablet Take 1 tablet (112 mcg total) by mouth daily before breakfast. 03/24/22   Hongalgi, Anand D, MD  mirtazapine  (REMERON ) 30 MG tablet Take 30 mg by mouth at bedtime. 07/03/15   [provider]  Multiple Vitamin (MULTIVITAMIN WITH MINERALS) TABS tablet Take 1 tablet by mouth daily. 03/25/22   Hongalgi, Anand D, MD  thiamine  100 MG tablet Take 1 tablet (100 mg total) by mouth daily. 03/25/22   Hongalgi, Anand D, MD  tiZANidine  (ZANAFLEX ) 4 MG tablet Take 4 mg by mouth 3 (three) times daily. 09/27/21   [provider]    Allergies: Bee venom    Review of Systems  Constitutional:  Negative for fever.    Updated Vital Signs There were no vitals taken for this visit.  Physical Exam Vitals and nursing note reviewed.  Constitutional:      Appearance: He is well-developed.  HENT:     Head: Normocephalic and atraumatic.  Eyes:     Conjunctiva/sclera: Conjunctivae normal.     Comments: Chronic blindness left eye  Cardiovascular:     Rate and Rhythm: Normal rate and regular rhythm.     Heart sounds: No murmur heard. Pulmonary:     Effort: Pulmonary effort is normal. No respiratory distress.     Breath sounds:  Normal breath sounds.  Abdominal:     Palpations: Abdomen is soft.     Tenderness: There is no abdominal tenderness.  Musculoskeletal:        General: Swelling and tenderness present.     Cervical back: Neck supple.     Right lower leg: Edema present.     Left lower leg: Edema present.     Comments: He has an old skin graft site on his right calf.  There is some lichenification of ankle and foot.  Mild edema.  Unable to appreciate pulses although he is warm and not mottled.  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  Mild edema.  Unable to appreciate pulses although no mottling.  Diminished sensation both lower extremities.  Skin:    General: Skin is warm and dry.  Neurological:     Mental Status: He is alert.      GCS: GCS eye subscore is 4. GCS verbal subscore is 5. GCS motor subscore is 6.     Motor: No weakness.        (all labs ordered are listed, but only abnormal results are displayed) Labs Reviewed  BASIC METABOLIC PANEL WITH GFR - Abnormal; Notable for the following components:      Result Value   CO2 21 (*)    Glucose, Bld 68 (*)    Anion gap 16 (*)    All other components within normal limits  CBC WITH DIFFERENTIAL/PLATELET    EKG: None  Radiology: VAS US  LOWER EXTREMITY VENOUS (DVT) (ONLY [facility] & [facility]) Result Date: 08/05/2024  Lower Venous DVT Study Patient Name:  Phillip Kemp  Date of Exam:   08/05/2024 Medical Rec #: [id number]         Accession #:    [id number] Date of Birth: [birth year]        Patient Gender: M Patient Age:   26 years Exam Location:  [facility] Procedure:      VAS US  LOWER EXTREMITY VENOUS (DVT) Referring Phys: Klara Stjames --------------------------------------------------------------------------------  Indications: Pain, Swelling, and Edema. Other Indications: Redness in both legs for several months. Comparison Study: 01/07/22 - Negative Performing Technologist: Ricka Sturdivant-Jones RDMS, RVT  Examination Guidelines: A complete evaluation includes B-mode imaging, spectral Doppler, color Doppler, and power Doppler as needed of all accessible portions of each vessel. Bilateral testing is considered an integral part of a complete examination. Limited examinations for reoccurring indications may be performed as noted. The reflux portion of the exam is performed with the patient in reverse Trendelenburg.  +---------+---------------+---------+-----------+----------+--------------+ RIGHT    CompressibilityPhasicitySpontaneityPropertiesThrombus Aging +---------+---------------+---------+-----------+----------+--------------+ CFV      Full           Yes      Yes                                  +---------+---------------+---------+-----------+----------+--------------+ SFJ      Full                                                        +---------+---------------+---------+-----------+----------+--------------+ FV Prox  Full                                                        +---------+---------------+---------+-----------+----------+--------------+  FV Mid   Full           Yes      Yes                                 +---------+---------------+---------+-----------+----------+--------------+ FV DistalFull                                                        +---------+---------------+---------+-----------+----------+--------------+ PFV      Full                                                        +---------+---------------+---------+-----------+----------+--------------+ POP      Full           Yes      Yes                                 +---------+---------------+---------+-----------+----------+--------------+ PTV      Full                                                        +---------+---------------+---------+-----------+----------+--------------+ PERO     Full                                                        +---------+---------------+---------+-----------+----------+--------------+   +---------+---------------+---------+-----------+----------+--------------+ LEFT     CompressibilityPhasicitySpontaneityPropertiesThrombus Aging +---------+---------------+---------+-----------+----------+--------------+ CFV      Full           Yes      Yes                                 +---------+---------------+---------+-----------+----------+--------------+ SFJ      Full                                                        +---------+---------------+---------+-----------+----------+--------------+ FV Prox  Full                                                         +---------+---------------+---------+-----------+----------+--------------+ FV Mid   Full           Yes      Yes                                 +---------+---------------+---------+-----------+----------+--------------+  FV DistalFull                                                        +---------+---------------+---------+-----------+----------+--------------+ PFV      Full                                                        +---------+---------------+---------+-----------+----------+--------------+ POP      Full           Yes      Yes                                 +---------+---------------+---------+-----------+----------+--------------+ PTV      Full                                                        +---------+---------------+---------+-----------+----------+--------------+ PERO     Full                                                        +---------+---------------+---------+-----------+----------+--------------+   Summary: RIGHT: - There is no evidence of deep vein thrombosis in the lower extremity.  - No cystic structure found in the popliteal fossa. - Ultrasound characteristics of enlarged lymph nodes are noted in the groin.  LEFT: - There is no evidence of deep vein thrombosis in the lower extremity.  - No cystic structure found in the popliteal fossa.  *See table(s) above for measurements and observations.    Preliminary      Procedures   Medications Ordered in the ED  oxyCODONE -acetaminophen  (PERCOCET/ROXICET) 5-325 MG per tablet 1 tablet (has no administration in time range)    Clinical Course as of 08/05/24 1802  Fri Aug 05, 2024  1132 Duplex of bilateral lower extremity shows no acute DVT. [MB]  1502 Reviewed results of workup with patient.  He is asking for some pain medicine.  He is comfortable plan for discharge and outpatient follow-up. [MB]    Clinical Course User Index [MB] Towana Ozell BROCKS, MD                                  Medical Decision Making Amount and/or Complexity of Data Reviewed Labs: ordered.  Risk Prescription drug management.   This patient complains of bilateral leg pain for months; this involves an extensive number of treatment Options and is a complaint that carries with it a high risk of complications and morbidity. The differential includes peripheral vascular disease, DVT, cellulitis  I ordered, reviewed and interpreted labs, which included CBC with normal white count normal hemoglobin, chemistries with mildly low bicarb low glucose I ordered medication oral pain medicine and reviewed PMP when indicated. I ordered imaging  studies which included duplex bilateral lower extremities and I independently    visualized and interpreted imaging which showed no acute findings Previous records obtained and reviewed in epic including recent PCP notes Cardiac monitoring reviewed, sinus rhythm Social determinants considered, no significant barriers Critical Interventions: None  After the interventions stated above, I reevaluated the patient and found awake and alert in no distress Admission and further testing considered, no indications for admission.  Suspect some level of peripheral vascular disease and claudication but no evidence of ischemic limb.  No evidence of cellulitis.  Recommend close follow-up with his treatment team as it sounds like his PCP is setting him up with podiatry.      Final diagnoses:  Bilateral leg pain  PVD (peripheral vascular disease) [facility])    ED Discharge Orders     None          Towana Ozell BROCKS, MD 08/05/24 [phone]

## 2024-08-05 NOTE — Progress Notes (Signed)
 Venous duplex lower extremities completed.  Refer to [facility] under chart review to view preliminary results.   08/05/2024  11:03 AM Levina Boyack, Ricka BIRCH

## 2024-08-08 ENCOUNTER — Other Ambulatory Visit: Payer: Self-pay | Admitting: Family

## 2024-08-08 DIAGNOSIS — I739 Peripheral vascular disease, unspecified: Secondary | ICD-10-CM

## 2024-08-16 ENCOUNTER — Inpatient Hospital Stay: Admission: RE | Admit: 2024-08-16 | Source: Ambulatory Visit

## 2024-08-19 ENCOUNTER — Ambulatory Visit
Admission: RE | Admit: 2024-08-19 | Discharge: 2024-08-19 | Disposition: A | Discharge status: Home / Self Care | Source: Ambulatory Visit | Attending: Family | Admitting: Family

## 2024-08-19 DIAGNOSIS — I739 Peripheral vascular disease, unspecified: Secondary | ICD-10-CM

## 2024-09-19 ENCOUNTER — Ambulatory Visit (INDEPENDENT_AMBULATORY_CARE_PROVIDER_SITE_OTHER): Payer: Self-pay | Admitting: Podiatry

## 2024-09-19 DIAGNOSIS — Z91199 Patient's noncompliance with other medical treatment and regimen due to unspecified reason: Secondary | ICD-10-CM

## 2024-09-19 NOTE — Progress Notes (Signed)
 No show

## 2024-12-12 ENCOUNTER — Encounter: Payer: Self-pay | Admitting: Podiatry

## 2024-12-12 ENCOUNTER — Ambulatory Visit (INDEPENDENT_AMBULATORY_CARE_PROVIDER_SITE_OTHER): Admitting: Podiatry

## 2024-12-12 DIAGNOSIS — B351 Tinea unguium: Secondary | ICD-10-CM

## 2024-12-12 DIAGNOSIS — I999 Unspecified disorder of circulatory system: Secondary | ICD-10-CM

## 2024-12-12 DIAGNOSIS — M79674 Pain in right toe(s): Secondary | ICD-10-CM | POA: Diagnosis not present

## 2024-12-12 DIAGNOSIS — M79675 Pain in left toe(s): Secondary | ICD-10-CM

## 2024-12-13 NOTE — Progress Notes (Signed)
 Subjective:   Patient ID: Phillip Kemp, male   DOB: 63 y.o.   MRN: [id number]   HPI Patient presents stating he has a lot of pain with his nailbeds and his leg left over her right and he cannot cut his toenails himself.  Patient has stopped smoking tries to be active   Review of Systems  All other systems reviewed and are negative.       Objective:  Physical Exam Vitals and nursing note reviewed.  Constitutional:      Appearance: He is well-developed.  Pulmonary:     Effort: Pulmonary effort is normal.  Musculoskeletal:        General: Normal range of motion.  Skin:    General: Skin is warm.  Neurological:     Mental Status: He is alert.     Vascular status indicates significant diminishment of flow left with the patient found to have thickened yellowed brittle nailbeds 1-5 both feet painful when pressed inability to wear shoe gear comfortably with the deformity present.  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

## 2025-01-19 ENCOUNTER — Ambulatory Visit: Admitting: Podiatry

## 2025-01-26 ENCOUNTER — Ambulatory Visit: Admitting: Podiatry
